# Patient Record
Sex: Female | Born: 1935 | Race: White | Hispanic: No | Smoking: Never smoker
Health system: Southern US, Community
[De-identification: ages and names within clinical notes are randomized; demographics above are authoritative.]

## PROBLEM LIST (undated history)

## (undated) DIAGNOSIS — R0602 Shortness of breath: Secondary | ICD-10-CM

## (undated) DIAGNOSIS — IMO0002 Reserved for concepts with insufficient information to code with codable children: Secondary | ICD-10-CM

## (undated) DIAGNOSIS — I779 Disorder of arteries and arterioles, unspecified: Secondary | ICD-10-CM

## (undated) DIAGNOSIS — Z90710 Acquired absence of both cervix and uterus: Secondary | ICD-10-CM

## (undated) DIAGNOSIS — I2510 Atherosclerotic heart disease of native coronary artery without angina pectoris: Secondary | ICD-10-CM

## (undated) DIAGNOSIS — K859 Acute pancreatitis without necrosis or infection, unspecified: Secondary | ICD-10-CM

## (undated) DIAGNOSIS — M489 Spondylopathy, unspecified: Secondary | ICD-10-CM

## (undated) DIAGNOSIS — E785 Hyperlipidemia, unspecified: Secondary | ICD-10-CM

## (undated) DIAGNOSIS — R002 Palpitations: Secondary | ICD-10-CM

## (undated) DIAGNOSIS — I10 Essential (primary) hypertension: Secondary | ICD-10-CM

## (undated) DIAGNOSIS — Z951 Presence of aortocoronary bypass graft: Secondary | ICD-10-CM

## (undated) DIAGNOSIS — I739 Peripheral vascular disease, unspecified: Secondary | ICD-10-CM

## (undated) DIAGNOSIS — R9430 Abnormal result of cardiovascular function study, unspecified: Secondary | ICD-10-CM

## (undated) DIAGNOSIS — E875 Hyperkalemia: Secondary | ICD-10-CM

## (undated) DIAGNOSIS — R42 Dizziness and giddiness: Secondary | ICD-10-CM

## (undated) HISTORY — DX: Disorder of arteries and arterioles, unspecified: I77.9

## (undated) HISTORY — DX: Hyperkalemia: E87.5

## (undated) HISTORY — PX: CHOLECYSTECTOMY: SHX55

## (undated) HISTORY — DX: Hyperlipidemia, unspecified: E78.5

## (undated) HISTORY — DX: Essential (primary) hypertension: I10

## (undated) HISTORY — DX: Dizziness and giddiness: R42

## (undated) HISTORY — DX: Palpitations: R00.2

## (undated) HISTORY — DX: Acute pancreatitis without necrosis or infection, unspecified: K85.90

## (undated) HISTORY — DX: Abnormal result of cardiovascular function study, unspecified: R94.30

## (undated) HISTORY — DX: Shortness of breath: R06.02

## (undated) HISTORY — DX: Acquired absence of both cervix and uterus: Z90.710

## (undated) HISTORY — DX: Reserved for concepts with insufficient information to code with codable children: IMO0002

## (undated) HISTORY — DX: Atherosclerotic heart disease of native coronary artery without angina pectoris: I25.10

## (undated) HISTORY — DX: Peripheral vascular disease, unspecified: I73.9

## (undated) HISTORY — DX: Presence of aortocoronary bypass graft: Z95.1

## (undated) HISTORY — DX: Spondylopathy, unspecified: M48.9

---

## 1998-03-13 HISTORY — PX: CORONARY ARTERY BYPASS GRAFT: SHX141

## 1998-06-13 ENCOUNTER — Inpatient Hospital Stay (HOSPITAL_COMMUNITY): Admission: EM | Admit: 1998-06-13 | Discharge: 1998-06-24 | Payer: Self-pay | Admitting: Emergency Medicine

## 1998-06-13 ENCOUNTER — Encounter: Payer: Self-pay | Admitting: Emergency Medicine

## 1998-06-14 ENCOUNTER — Encounter: Payer: Self-pay | Admitting: Critical Care Medicine

## 1998-06-15 ENCOUNTER — Encounter: Payer: Self-pay | Admitting: Critical Care Medicine

## 1998-06-17 ENCOUNTER — Encounter: Payer: Self-pay | Admitting: Critical Care Medicine

## 1998-06-18 ENCOUNTER — Encounter: Payer: Self-pay | Admitting: Thoracic Surgery (Cardiothoracic Vascular Surgery)

## 1998-06-19 ENCOUNTER — Encounter: Payer: Self-pay | Admitting: Thoracic Surgery (Cardiothoracic Vascular Surgery)

## 1998-06-20 ENCOUNTER — Encounter: Payer: Self-pay | Admitting: Thoracic Surgery (Cardiothoracic Vascular Surgery)

## 1998-06-21 ENCOUNTER — Encounter: Payer: Self-pay | Admitting: Cardiothoracic Surgery

## 1998-06-23 ENCOUNTER — Encounter: Payer: Self-pay | Admitting: Thoracic Surgery (Cardiothoracic Vascular Surgery)

## 2000-11-13 ENCOUNTER — Encounter: Payer: Self-pay | Admitting: Internal Medicine

## 2000-11-13 ENCOUNTER — Ambulatory Visit (HOSPITAL_COMMUNITY): Admission: RE | Admit: 2000-11-13 | Discharge: 2000-11-13 | Payer: Self-pay | Admitting: Internal Medicine

## 2003-07-08 ENCOUNTER — Emergency Department (HOSPITAL_COMMUNITY): Admission: EM | Admit: 2003-07-08 | Discharge: 2003-07-09 | Payer: Self-pay | Admitting: Emergency Medicine

## 2003-07-09 ENCOUNTER — Inpatient Hospital Stay (HOSPITAL_COMMUNITY): Admission: EM | Admit: 2003-07-09 | Discharge: 2003-07-11 | Payer: Self-pay | Admitting: Cardiology

## 2004-07-14 ENCOUNTER — Ambulatory Visit: Payer: Self-pay | Admitting: Cardiology

## 2004-09-29 ENCOUNTER — Inpatient Hospital Stay (HOSPITAL_COMMUNITY): Admission: EM | Admit: 2004-09-29 | Discharge: 2004-10-02 | Payer: Self-pay | Admitting: Emergency Medicine

## 2004-09-30 ENCOUNTER — Ambulatory Visit: Payer: Self-pay | Admitting: Cardiology

## 2004-10-01 ENCOUNTER — Ambulatory Visit: Payer: Self-pay | Admitting: Internal Medicine

## 2004-11-07 ENCOUNTER — Ambulatory Visit: Payer: Self-pay | Admitting: Cardiology

## 2004-11-10 ENCOUNTER — Ambulatory Visit: Payer: Self-pay | Admitting: Internal Medicine

## 2004-11-16 ENCOUNTER — Ambulatory Visit: Payer: Self-pay | Admitting: Cardiology

## 2004-11-29 ENCOUNTER — Ambulatory Visit: Payer: Self-pay | Admitting: Internal Medicine

## 2005-01-11 ENCOUNTER — Ambulatory Visit: Payer: Self-pay | Admitting: Internal Medicine

## 2005-01-19 ENCOUNTER — Ambulatory Visit: Payer: Self-pay | Admitting: Internal Medicine

## 2005-02-28 ENCOUNTER — Ambulatory Visit: Payer: Self-pay | Admitting: Cardiology

## 2005-05-10 ENCOUNTER — Ambulatory Visit: Payer: Self-pay | Admitting: Internal Medicine

## 2005-06-29 ENCOUNTER — Ambulatory Visit: Payer: Self-pay | Admitting: Internal Medicine

## 2005-08-24 ENCOUNTER — Ambulatory Visit: Payer: Self-pay | Admitting: Cardiology

## 2005-10-03 ENCOUNTER — Emergency Department (HOSPITAL_COMMUNITY): Admission: EM | Admit: 2005-10-03 | Discharge: 2005-10-03 | Payer: Self-pay | Admitting: Emergency Medicine

## 2005-11-07 ENCOUNTER — Ambulatory Visit: Payer: Self-pay | Admitting: Cardiology

## 2006-03-12 ENCOUNTER — Ambulatory Visit: Payer: Self-pay | Admitting: Cardiology

## 2006-03-12 LAB — CONVERTED CEMR LAB
ALT: 15 units/L (ref 0–40)
Albumin: 3.7 g/dL (ref 3.5–5.2)
Alkaline Phosphatase: 63 units/L (ref 39–117)
Bilirubin, Direct: 0.1 mg/dL (ref 0.0–0.3)
HDL: 53.1 mg/dL (ref >39.0)

## 2006-04-18 ENCOUNTER — Ambulatory Visit: Payer: Self-pay | Admitting: Cardiology

## 2006-04-18 ENCOUNTER — Inpatient Hospital Stay (HOSPITAL_COMMUNITY): Admission: EM | Admit: 2006-04-18 | Discharge: 2006-04-20 | Payer: Self-pay | Admitting: Emergency Medicine

## 2006-05-01 ENCOUNTER — Ambulatory Visit: Payer: Self-pay

## 2006-05-01 ENCOUNTER — Ambulatory Visit: Payer: Self-pay | Admitting: Cardiology

## 2006-05-01 LAB — CONVERTED CEMR LAB
Basophils Relative: 0.3 % (ref 0.0–1.0)
Creatinine, Ser: 1 mg/dL (ref 0.4–1.2)
Eosinophils Relative: 0.8 % (ref 0.0–5.0)
GFR calc Af Amer: 70 mL/min
Glucose, Bld: 281 mg/dL — ABNORMAL HIGH (ref 70–99)
Hemoglobin: 11 g/dL — ABNORMAL LOW (ref 12.0–15.0)
MCV: 92.1 fL (ref 78.0–100.0)
Monocytes Relative: 5.8 % (ref 3.0–11.0)
Platelets: 321 10*3/uL (ref 150–400)
Potassium: 5.2 meq/L — ABNORMAL HIGH (ref 3.5–5.1)
RBC: 3.57 M/uL — ABNORMAL LOW (ref 3.87–5.11)
RDW: 12.5 % (ref 11.5–14.6)
Sodium: 142 meq/L (ref 135–145)
TSH: 2.07 microintl units/mL (ref 0.35–5.50)

## 2006-07-06 ENCOUNTER — Ambulatory Visit: Payer: Self-pay | Admitting: Cardiology

## 2006-08-23 ENCOUNTER — Ambulatory Visit: Payer: Self-pay | Admitting: Cardiology

## 2006-08-23 LAB — CONVERTED CEMR LAB
ALT: 15 units/L (ref 0–40)
AST: 21 units/L (ref 0–37)
Cholesterol: 155 mg/dL (ref 0–200)
HDL: 44.2 mg/dL (ref >39.0)
Total Bilirubin: 1 mg/dL (ref 0.3–1.2)
VLDL: 34 mg/dL (ref 0–40)

## 2006-12-28 ENCOUNTER — Ambulatory Visit: Payer: Self-pay | Admitting: Cardiology

## 2007-03-14 HISTORY — PX: CARDIAC CATHETERIZATION: SHX172

## 2007-05-20 ENCOUNTER — Inpatient Hospital Stay (HOSPITAL_COMMUNITY): Admission: EM | Admit: 2007-05-20 | Discharge: 2007-05-22 | Payer: Self-pay | Admitting: Emergency Medicine

## 2007-05-20 ENCOUNTER — Ambulatory Visit: Payer: Self-pay | Admitting: Cardiology

## 2007-06-11 ENCOUNTER — Ambulatory Visit: Payer: Self-pay | Admitting: Cardiology

## 2007-12-04 ENCOUNTER — Ambulatory Visit: Payer: Self-pay | Admitting: Cardiology

## 2008-01-02 ENCOUNTER — Ambulatory Visit: Payer: Self-pay | Admitting: Cardiology

## 2008-07-26 ENCOUNTER — Ambulatory Visit: Payer: Self-pay | Admitting: Cardiology

## 2008-07-26 ENCOUNTER — Observation Stay (HOSPITAL_COMMUNITY): Admission: EM | Admit: 2008-07-26 | Discharge: 2008-07-28 | Payer: Self-pay | Admitting: *Deleted

## 2008-08-17 ENCOUNTER — Encounter: Payer: Self-pay | Admitting: Internal Medicine

## 2008-08-17 ENCOUNTER — Telehealth: Payer: Self-pay | Admitting: Cardiology

## 2008-09-15 ENCOUNTER — Encounter: Payer: Self-pay | Admitting: Cardiology

## 2008-09-17 ENCOUNTER — Ambulatory Visit: Payer: Self-pay | Admitting: Cardiology

## 2009-03-15 ENCOUNTER — Ambulatory Visit: Payer: Self-pay | Admitting: Cardiology

## 2009-06-09 ENCOUNTER — Telehealth: Payer: Self-pay | Admitting: Cardiology

## 2009-09-05 ENCOUNTER — Inpatient Hospital Stay (HOSPITAL_COMMUNITY): Admission: EM | Admit: 2009-09-05 | Discharge: 2009-09-10 | Payer: Self-pay | Admitting: Emergency Medicine

## 2009-09-05 ENCOUNTER — Encounter: Payer: Self-pay | Admitting: Cardiology

## 2009-09-17 ENCOUNTER — Emergency Department (HOSPITAL_COMMUNITY): Admission: EM | Admit: 2009-09-17 | Discharge: 2009-09-17 | Payer: Self-pay | Admitting: Emergency Medicine

## 2009-11-02 ENCOUNTER — Ambulatory Visit: Payer: Self-pay | Admitting: Cardiology

## 2009-12-07 ENCOUNTER — Encounter: Payer: Self-pay | Admitting: Cardiology

## 2009-12-10 ENCOUNTER — Encounter: Payer: Self-pay | Admitting: Cardiology

## 2009-12-16 ENCOUNTER — Ambulatory Visit: Payer: Self-pay | Admitting: Cardiology

## 2009-12-27 ENCOUNTER — Encounter: Payer: Self-pay | Admitting: Cardiology

## 2009-12-27 ENCOUNTER — Ambulatory Visit: Payer: Self-pay | Admitting: Cardiology

## 2009-12-27 ENCOUNTER — Ambulatory Visit: Payer: Self-pay

## 2009-12-27 ENCOUNTER — Ambulatory Visit (HOSPITAL_COMMUNITY): Admission: RE | Admit: 2009-12-27 | Discharge: 2009-12-27 | Payer: Self-pay | Admitting: Cardiology

## 2010-01-01 ENCOUNTER — Encounter: Payer: Self-pay | Admitting: Cardiology

## 2010-01-06 ENCOUNTER — Ambulatory Visit: Payer: Self-pay | Admitting: Cardiology

## 2010-04-14 NOTE — Letter (Signed)
Summary: [facility]   Imported By: Roderic Ovens 09/23/2009 11:26:40  _____________________________________________________________________  External Attachment:    Type:   Image     Comment:   External Document

## 2010-04-14 NOTE — Miscellaneous (Signed)
  Clinical Lists Changes  Observations: Added new observation of PAST MED HX: CABG.. 2000,  LIMA to the LAD, SVG to the diagonal SVG to OM SVG to posterior descending CAD...last cath 2009... grafts patent... ejection fraction 70%......80% small OM possible mild ischemia a EF  70% cath...2009  /    65-70%.....echo....12/2009 HYPERTENSION (ICD-401.9) PALPITATIONS, HX OF (ICD-V12.50) DYSLIPIDEMIA (ICD-272.4) DIABETES MELLITUS, TYPE II (ICD-250.00) PANCREATITIS, HX OF (ICD-V12.70)  normal LV function Carotid artery disease.. mild in 2000 Dizziness... workup April 2008.. no treatment needed cervical spine disease.... severe neck pain..... June, 2011 Shortness of breath (01/01/2010 21:12) Added new observation of PRIMARY MD: Juluis Rainier, MD (01/01/2010 21:12)       Past History:  Past Medical History: CABG.. 2000,  LIMA to the LAD, SVG to the diagonal SVG to OM SVG to posterior descending CAD...last cath 2009... grafts patent... ejection fraction 70%......80% small OM possible mild ischemia a EF  70% cath...2009  /    65-70%.....echo....12/2009 HYPERTENSION (ICD-401.9) PALPITATIONS, HX OF (ICD-V12.50) DYSLIPIDEMIA (ICD-272.4) DIABETES MELLITUS, TYPE II (ICD-250.00) PANCREATITIS, HX OF (ICD-V12.70)  normal LV function Carotid artery disease.. mild in 2000 Dizziness... workup April 2008.. no treatment needed cervical spine disease.... severe neck pain..... June, 2011 Shortness of breath

## 2010-04-14 NOTE — Progress Notes (Signed)
Summary: REFILL  Phone Note Refill Request Message from:  Patient on June 09, 2009 9:12 AM  Refills Requested: Medication #1:  LIPITOR 20 MG TABS Take one tablet by mouth once daily. [facility] [address] [phone] PT IS OUT OF MEDICATION  Initial call taken by: Judie Grieve,  June 09, 2009 9:12 AM  Follow-up for Phone Call        Rx completed in Dr. Tiajuana Amass Follow-up by: Hardin Negus, RMA,  June 09, 2009 1:29 PM

## 2010-04-14 NOTE — Letter (Signed)
Summary: [facility] Visit Note   [facility] Office Visit Note   Imported By: Roderic Ovens 12/24/2009 12:47:03  _____________________________________________________________________  External Attachment:    Type:   Image     Comment:   External Document

## 2010-04-14 NOTE — Assessment & Plan Note (Signed)
Summary: f19m   Visit Type:  6 mos Primary Provider:  C. Miguel Aschoff, MD  CC:  CAD.  History of Present Illness: The patient is seen for followup of coronary artery disease.  She is doing well.  She had an upper respiratory infection recently that was well treated.  She's not had any chest pain.  Her blood pressure today is nicely controlled.  He had been elevated at her primary doctor office when she had her upper respiratory infection.  Current Medications (verified): 1)  Aspirin 81 Mg Tbec (Aspirin) .... Take One Tablet By Mouth Daily 2)  Prinivil 10 Mg Tabs (Lisinopril) .... Take 1 Tablet By Mouth Two Times A Day 3)  Plavix 75 Mg Tabs (Clopidogrel Bisulfate) .... Take One Tablet By Mouth Daily 4)  Imdur 30 Mg Xr24h-Tab (Isosorbide Mononitrate) .... Take 1 Tablet By Mouth Once A Day 5)  Multivitamins  Tabs (Multiple Vitamin) .... Take 1 Tablet By Mouth Once A Day 6)  Januvia 100 Mg Tabs (Sitagliptin Phosphate) .... Take 1 Tablet By Mouth Once A Day 7)  Glucophage 500 Mg Tabs (Metformin Hcl) .... Take 1 Tablet By Mouth Two Times A Day 8)  Nitroglycerin 0.4 Mg Subl (Nitroglycerin) .... One Tablet Under Tongue Every 5 Minutes As Needed For Chest Pain---May Repeat Times Three 9)  Furosemide 20 Mg Tabs (Furosemide) .... Take One Tablet By Mouth Once Daily. 10)  Lipitor 20 Mg Tabs (Atorvastatin Calcium) .... Take One Tablet By Mouth Once Daily. 11)  Glyburide 5 Mg Tabs (Glyburide) .... On Tablet Three Times A Day  Allergies: 1)  ! Cortisone 2)  ! Codeine 3)  ! * Metronidazole? 4)  Codeine Phosphate (Codeine Phosphate) 5)  Codeine Phosphate (Codeine Phosphate) 6)  Cipro  Past History:  Past Medical History: Last updated: 09/15/2008 CABG.. 2000,  LIMA to the LAD, SVG to the diagonal SVG to OM SVG to posterior descending CAD...last cath 2009... grafts patent... ejection fraction 70%..80% small OM possible mild ischemia a HYPERTENSION (ICD-401.9) PALPITATIONS, HX OF  (ICD-V12.50) DYSLIPIDEMIA (ICD-272.4) DIABETES MELLITUS, TYPE II (ICD-250.00) PANCREATITIS, HX OF (ICD-V12.70)  normal LV function Carotid artery disease.. mild in 2000 Dizziness... workup April 2008.. no treatment needed  Review of Systems       Patient denies fever, chills, headache, sweats, rash, change in vision, change in hearing, chest pain, shortness of breath.  Her upper respiratory infection was associated with a cough that is improving.  She's not having any GI symptoms or urinary symptoms.  All other systems are reviewed and are negative.  Vital Signs:  Patient profile:   77 year old female Height:      67 inches Weight:      151 pounds BMI:     23.74 Pulse rate:   69 / minute BP sitting:   119 / 65  (left arm) Cuff size:   large  Vitals Entered By: Sara Staggers, RN (March 15, 2009 9:58 AM)  Physical Exam  General:  patient is stable in general. Eyes:  no xanthelasma. Neck:  no jugular venous distention. Lungs:  lungs are clear.  Respiratory effort is not labored Heart:  cardiac exam reveals S1-S2.  No clicks or significant murmurs. Abdomen:  abdomen is soft. Msk:  no musculoskeletal deformities. Extremities:  no peripheral edema. Psych:  patient is oriented to person time and place affect is normal.   Impression & Recommendations:  Problem # 1:  CAROTID ARTERY DISEASE (ICD-433.10) Patient had very minimal disease in the past.  At the time of her next visit we'll reconsider whether she should have a followup Doppler.  I do not hear bruits now.  Problem # 2:  CAD (ICD-414.00)  Coronary disease is stable.  EKG is done and reviewed by me.  They're nonspecific ST-T wave changes.  She is stable and no further workup is needed.  Orders: EKG w/ Interpretation (93000)  Problem # 3:  HYPERTENSION (ICD-401.9) Blood pressure is under good control today.  No change in therapy.  Patient Instructions: 1)  Follow up in 6 months

## 2010-04-14 NOTE — Miscellaneous (Signed)
  Clinical Lists Changes  Observations: Added new observation of CARDIO HPI: Dr Juluis Rainier called to tell me that the patient said she was having SOB with exertion. We Will arrange early follow up. (12/10/2009 13:59) Added new observation of PRIMARY MD: Juluis Rainier, MD (12/10/2009 13:59)      Primary Provider:  Juluis Rainier, MD   History of Present Illness: Dr Juluis Rainier called to tell me that the patient said she was having SOB with exertion. We Will arrange early follow up.    Primary Provider:  Juluis Rainier, MD   History of Present Illness: Dr Juluis Rainier called to tell me that the patient said she was having SOB with exertion. We Will arrange early follow up.  Appended Document:  Herbert Seta, see this note. Please call the patient to ask her about this. Then arrange follow up with me as soon as you feel appropriate.   Thnanks  I actually received the phone call a few days ago.  Appended Document:  spoke w/pt she states she has been having SOB w/exersion since she was d/c'd from hospital in July, it hasn't really gotten much worse but isn't getting better, no CP sch appt w/Dr Myrtis Ser for thur 10/6

## 2010-04-14 NOTE — Assessment & Plan Note (Signed)
Summary: per checkout/sf   Visit Type:  Follow-up Primary Provider:  Juluis Rainier, MD  CC:  shortness of breath.  History of Present Illness: The patient is seen for cardiology followup.  I saw her last December 16, 2009.  She has shortness of breath.  Two-dimensional echo was done showing excellent function.  There is no major valvular abnormalities.  She is able to get around.  If she has shortness of breath she stops and then proceeds.  She's not having chest pain.  She had catheterization in 2009 area that study showed that her grafts were patent.  There was question that she could have slight ischemia from a small OM vessel.  Current Medications (verified): 1)  Aspirin 81 Mg Tbec (Aspirin) .... Take One Tablet By Mouth Daily 2)  Lisinopril 20 Mg Tabs (Lisinopril) .... Take One Tablet By Mouth Daily 3)  Plavix 75 Mg Tabs (Clopidogrel Bisulfate) .... Take One Tablet By Mouth Daily 4)  Multivitamins  Tabs (Multiple Vitamin) .... Take 1 Tablet By Mouth Once A Day 5)  Januvia 100 Mg Tabs (Sitagliptin Phosphate) .... Take 1 Tablet By Mouth Once A Day 6)  Glucophage 500 Mg Tabs (Metformin Hcl) .... Take 1 Tablet By Mouth Two Times A Day 7)  Nitrostat 0.4 Mg Subl (Nitroglycerin) .Marland Kitchen.. 1 Tablet Under Tongue At Onset of Chest Pain; You May Repeat Every 5 Minutes For Up To 3 Doses. 8)  Furosemide 20 Mg Tabs (Furosemide) .... Take 1/2 Tablet By Mouth Once Daily. 9)  Lipitor 20 Mg Tabs (Atorvastatin Calcium) .... Take One Tablet By Mouth Once Daily. 10)  Glyburide 5 Mg Tabs (Glyburide) .... On Tablet Two Times A Day 11)  Acetaminophen 325 Mg  Tabs (Acetaminophen) .... As Needed 12)  Neurontin 100 Mg Caps (Gabapentin) .... Two Times A Day 13)  Nortriptyline Hcl 25 Mg Caps (Nortriptyline Hcl) .... At Bedtime  Allergies (verified): 1)  ! Codeine  Past History:  Past Medical History: CABG.. 2000,  LIMA to the LAD, SVG to the diagonal SVG to OM SVG to posterior descending CAD...last cath 2009...  grafts patent... ejection fraction 70%......80% small OM possible mild ischemia a EF  70% cath...2009  /    65-70%.....echo....12/2009 HYPERTENSION (ICD-401.9) PALPITATIONS, HX OF (ICD-V12.50) DYSLIPIDEMIA (ICD-272.4) DIABETES MELLITUS, TYPE II (ICD-250.00).Marland Kitchen PANCREATITIS, HX OF (ICD-V12.70)  normal LV function Carotid artery disease.. mild in 2000 Dizziness... workup April 2008.. no treatment needed cervical spine disease.... severe neck pain..... June, 2011 Shortness of breath  Review of Systems       Patient denies fever, chills, headache, sweats, rash, change in vision, change in hearing, chest pain, cough, nausea vomiting, urinary symptoms.  All other systems are reviewed and are negative.  Vital Signs:  Patient profile:   77 year old female Height:      67 inches Weight:      153 pounds BMI:     24.05 Pulse rate:   85 / minute BP sitting:   134 / 72  (left arm) Cuff size:   regular  Vitals Entered By: Hardin Negus, RMA (January 06, 2010 11:28 AM)  Physical Exam  General:  patient stable today. Eyes:  no xanthelasma. Neck:  no jugular venous distention. Chest Wall:  no chest wall tenderness. Lungs:  lungs are clear.  Respiratory effort is nonlabored. Heart:  cardiac exam reveals S1 and S2.  No clicks or significant murmurs. Abdomen:  abdomen is soft. Extremities:  no peripheral edema. Psych:  patient is oriented to person time  and place.  Affect is normal.   Impression & Recommendations:  Problem # 1:  MURMUR (ICD-785.2)  Her updated medication list for this problem includes:    Lisinopril 20 Mg Tabs (Lisinopril) .Marland Kitchen... Take one tablet by mouth daily    Nitrostat 0.4 Mg Subl (Nitroglycerin) .Marland Kitchen... 1 tablet under tongue at onset of chest pain; you may repeat every 5 minutes for up to 3 doses.    Furosemide 20 Mg Tabs (Furosemide) .Marland Kitchen... Take 1/2 tablet by mouth once daily. The patient does not have any significant valvular disease.  No further workup is  needed.  Problem # 2:  SHORTNESS OF BREATH (ICD-786.05)  Her updated medication list for this problem includes:    Aspirin 81 Mg Tbec (Aspirin) .Marland Kitchen... Take one tablet by mouth daily    Lisinopril 20 Mg Tabs (Lisinopril) .Marland Kitchen... Take one tablet by mouth daily    Furosemide 20 Mg Tabs (Furosemide) .Marland Kitchen... Take 1/2 tablet by mouth once daily. This point I am not convinced that the patient is having significant cardiac disease as the basis of her shortness of breath.  I do not think that this is an anginal equivalent.  I have chosen not to proceed with exercise testing.  Left ventricular function is good.  No change in medications. I will plan to see the patient back in 6 months for followup.  I have encouraged her to go back to full activities.  If she feels short of breath she can rest and then proceed  Problem # 3:  HYPERTENSION (ICD-401.9)  Her updated medication list for this problem includes:    Aspirin 81 Mg Tbec (Aspirin) .Marland Kitchen... Take one tablet by mouth daily    Lisinopril 20 Mg Tabs (Lisinopril) .Marland Kitchen... Take one tablet by mouth daily    Furosemide 20 Mg Tabs (Furosemide) .Marland Kitchen... Take 1/2 tablet by mouth once daily. Blood pressure is under good control.  No change in therapy.  Patient Instructions: 1)  Your physician recommends that you schedule a follow-up appointment in: 6 months. The office will mail you a reminder 2 months prior appopintment date. 2)  Your physician recommends that you continue on your current medications as directed. Please refer to the Current Medication list given to you today.

## 2010-04-14 NOTE — Assessment & Plan Note (Signed)
Summary: 6 month follow up/mt   Visit Type:  Follow-up Primary Provider:  Juluis Rainier, MD  CC:  CAD.  History of Present Illness: The patient has coronary disease. recently she was hospitalized with hypertension and neck pain.  She has significant disease at C5-C6.  It was felt that she should see neurosurgery in followup.  This was done and she is not interested in any type of surgery.  Unfortunately she continues to have significant neck pain.  She's not having any chest pain.  Current Medications (verified): 1)  Aspirin 81 Mg Tbec (Aspirin) .... Take One Tablet By Mouth Daily 2)  Lisinopril 20 Mg Tabs (Lisinopril) .... Take One Tablet By Mouth Daily 3)  Plavix 75 Mg Tabs (Clopidogrel Bisulfate) .... Take One Tablet By Mouth Daily 4)  Multivitamins  Tabs (Multiple Vitamin) .... Take 1 Tablet By Mouth Once A Day 5)  Januvia 100 Mg Tabs (Sitagliptin Phosphate) .... Take 1 Tablet By Mouth Once A Day 6)  Glucophage 500 Mg Tabs (Metformin Hcl) .... Take 1 Tablet By Mouth Two Times A Day 7)  Nitroglycerin 0.4 Mg Subl (Nitroglycerin) .... One Tablet Under Tongue Every 5 Minutes As Needed For Chest Pain---May Repeat Times Three 8)  Furosemide 20 Mg Tabs (Furosemide) .... Take 1/2 Tablet By Mouth Once Daily. 9)  Lipitor 20 Mg Tabs (Atorvastatin Calcium) .... Take One Tablet By Mouth Once Daily. 10)  Glyburide 5 Mg Tabs (Glyburide) .... On Tablet Two Times A Day 11)  Acetaminophen 325 Mg  Tabs (Acetaminophen) .... As Needed 12)  Lorazepam 0.5 Mg Tabs (Lorazepam) .... As Needed 13)  Neurontin 100 Mg Caps (Gabapentin) .... Two Times A Day 14)  Nortriptyline Hcl 25 Mg Caps (Nortriptyline Hcl) .... At Bedtime  Allergies: 1)  ! Codeine  Past History:  Past Medical History: CABG.. 2000,  LIMA to the LAD, SVG to the diagonal SVG to OM SVG to posterior descending CAD...last cath 2009... grafts patent... ejection fraction 70%..80% small OM possible mild ischemia a HYPERTENSION  (ICD-401.9) PALPITATIONS, HX OF (ICD-V12.50) DYSLIPIDEMIA (ICD-272.4) DIABETES MELLITUS, TYPE II (ICD-250.00) PANCREATITIS, HX OF (ICD-V12.70)  normal LV function Carotid artery disease.. mild in 2000 Dizziness... workup April 2008.. no treatment needed cervical spine disease.... severe neck pain..... June, 2011  Review of Systems       Patient denies fever, chills, headache, sweats, rash, change in vision, change in hearing, chest pain, cough, nausea vomiting, urinary symptoms.  All of the systems are reviewed and are negative.  Vital Signs:  Patient profile:   77 year old female Height:      67 inches Weight:      154 pounds BMI:     24.21 Pulse rate:   70 / minute BP sitting:   128 / 66  (left arm) Cuff size:   regular  Vitals Entered By: Hardin Negus, RMA (November 02, 2009 3:20 PM)  Physical Exam  General:  patient is stable today.  She seems uncomfortable with neck pain. Head:  head is atraumatic. Eyes:  no xanthelasma. Neck:  no jugular venous distention. Lungs:  lungs are clear.  Respiratory effort is nonlabored. Heart:  cardiac exam reveals S1 and S2.  No clicks or significant murmurs. Abdomen:  abdomen is soft. Extremities:  no peripheral edema. Psych:  patient is oriented to person time and place.  Affect is normal.   Impression & Recommendations:  Problem # 1:  * CERVICAL SPINE DISEASE.... NECK PAIN Unfortunately the patient continues to have significant discomfort from  this problem.  I made it clear to her that from the cardiac viewpoint I feel that she is stable for possible surgery if she were to consider this.  Problem # 2:  CAROTID ARTERY DISEASE (ICD-433.10)  Her updated medication list for this problem includes:    Aspirin 81 Mg Tbec (Aspirin) .Marland Kitchen... Take one tablet by mouth daily    Plavix 75 Mg Tabs (Clopidogrel bisulfate) .Marland Kitchen... Take one tablet by mouth daily Historically her carotid artery disease is mild.  Problem # 3:  CAD (ICD-414.00)  The  following medications were removed from the medication list:    Imdur 30 Mg Xr24h-tab (Isosorbide mononitrate) .Marland Kitchen... Take 1 tablet by mouth once a day Her updated medication list for this problem includes:    Aspirin 81 Mg Tbec (Aspirin) .Marland Kitchen... Take one tablet by mouth daily    Lisinopril 20 Mg Tabs (Lisinopril) .Marland Kitchen... Take one tablet by mouth daily    Plavix 75 Mg Tabs (Clopidogrel bisulfate) .Marland Kitchen... Take one tablet by mouth daily    Nitroglycerin 0.4 Mg Subl (Nitroglycerin) ..... One tablet under tongue every 5 minutes as needed for chest pain---may repeat times three Coronary disease is stable.  No further workup needed.  Problem # 4:  HYPERTENSION (ICD-401.9)  Her updated medication list for this problem includes:    Aspirin 81 Mg Tbec (Aspirin) .Marland Kitchen... Take one tablet by mouth daily    Lisinopril 20 Mg Tabs (Lisinopril) .Marland Kitchen... Take one tablet by mouth daily    Furosemide 20 Mg Tabs (Furosemide) .Marland Kitchen... Take 1/2 tablet by mouth once daily. Blood pressure is controlled today.  No change in therapy.  Patient Instructions: 1)  Your physician recommends that you schedule a follow-up appointment in: 6 months

## 2010-04-14 NOTE — Assessment & Plan Note (Signed)
Summary: [name]   Visit Type:  Follow-up Primary Provider:  Juluis Rainier, MD  CC:  shortness of breath.  History of Present Illness: The patient is here for followup of shortness of breath.  I had received a phone call from Dr. Zachery Dauer.  She mentioned that the patient has complained of shortness of breath with exertion.  The patient has not had PND or orthopnea.  She goes about reasonable activities.  She does clean her own home.  However when she becomes short of breath he stops and rests.  She has not had chest.  There's been no syncope or presyncope.  She's not had any significant palpitations.  Unfortunately she continues to have significant discomfort from her neck.  As outlined previously, she did not want any type of cervical surgery or neck pain.  I do not know if other forms of therapy could help her.  There is question also of a murmur.   Current Medications (verified): 1)  Aspirin 81 Mg Tbec (Aspirin) .... Take One Tablet By Mouth Daily 2)  Lisinopril 20 Mg Tabs (Lisinopril) .... Take One Tablet By Mouth Daily 3)  Plavix 75 Mg Tabs (Clopidogrel Bisulfate) .... Take One Tablet By Mouth Daily 4)  Multivitamins  Tabs (Multiple Vitamin) .... Take 1 Tablet By Mouth Once A Day 5)  Januvia 100 Mg Tabs (Sitagliptin Phosphate) .... Take 1 Tablet By Mouth Once A Day 6)  Glucophage 500 Mg Tabs (Metformin Hcl) .... Take 1 Tablet By Mouth Two Times A Day 7)  Nitrostat 0.4 Mg Subl (Nitroglycerin) .Marland Kitchen.. 1 Tablet Under Tongue At Onset of Chest Pain; You May Repeat Every 5 Minutes For Up To 3 Doses. 8)  Furosemide 20 Mg Tabs (Furosemide) .... Take 1/2 Tablet By Mouth Once Daily. 9)  Lipitor 20 Mg Tabs (Atorvastatin Calcium) .... Take One Tablet By Mouth Once Daily. 10)  Glyburide 5 Mg Tabs (Glyburide) .... On Tablet Two Times A Day 11)  Acetaminophen 325 Mg  Tabs (Acetaminophen) .... As Needed 12)  Neurontin 100 Mg Caps (Gabapentin) .... Two Times A Day 13)  Nortriptyline Hcl 25 Mg Caps  (Nortriptyline Hcl) .... At Bedtime  Allergies (verified): 1)  ! Codeine  Past History:  Past Medical History: CABG.. 2000,  LIMA to the LAD, SVG to the diagonal SVG to OM SVG to posterior descending CAD...last cath 2009... grafts patent... ejection fraction 70%..80% small OM possible mild ischemia a HYPERTENSION (ICD-401.9) PALPITATIONS, HX OF (ICD-V12.50) DYSLIPIDEMIA (ICD-272.4) DIABETES MELLITUS, TYPE II (ICD-250.00) PANCREATITIS, HX OF (ICD-V12.70)  normal LV function Carotid artery disease.. mild in 2000 Dizziness... workup April 2008.. no treatment needed cervical spine disease.... severe neck pain..... June, 2011 Shortness of breath  Review of Systems       Patient denies fever, chills, headache, sweats, rash, change in vision, change in hearing, chest pain, cough, nausea vomiting, urinary symptoms.  All other systems are reviewed and are negative.  Vital Signs:  Patient profile:   77 year old female Height:      67 inches Weight:      152 pounds BMI:     23.89 Pulse rate:   70 / minute BP sitting:   134 / 78  Vitals Entered By: Hardin Negus, RMA (December 16, 2009 10:00 AM)  Physical Exam  General:  patient is stable today. Head:  head is atraumatic. Eyes:  no xanthelasma. Neck:  no jugular venous distention. Chest Wall:  no chest wall tenderness. Lungs:  lungs are clear.  Respiratory effort  is nonlabored. Heart:  cardiac exam reveals S1 and S2.  there is a 2/6 systolic murmur. Abdomen:  abdomen is soft. Msk:  no musculoskeletal deformities. Extremities:  no peripheral edema. Skin:  no skin rashes. Psych:  patient is oriented to person time and place affect is normal.   Impression & Recommendations:  Problem # 1:  SHORTNESS OF BREATH (ICD-786.05)  Her updated medication list for this problem includes:    Aspirin 81 Mg Tbec (Aspirin) .Marland Kitchen... Take one tablet by mouth daily    Lisinopril 20 Mg Tabs (Lisinopril) .Marland Kitchen... Take one tablet by mouth daily     Furosemide 20 Mg Tabs (Furosemide) .Marland Kitchen... Take 1/2 tablet by mouth once daily. the patient has shortness of breath.  Recent labs revealed BUN of 16 creatinine 1.06.  Hemoglobin A1c was 6.7.  LDL was 76 with an HDL of 48 and triglycerides 184.  I do not have a hemoglobin. There is a soft systolic murmur.  I doubt significant aortic stenosis.  However 2-D echo will be done to reassess all valves and left ventricular and right ventricular function for further information.  I will then see her back for followup.  I have chosen not to change any of her medicines at this time.  Problem # 2:  MURMUR (ICD-785.2)  Her updated medication list for this problem includes:    Lisinopril 20 Mg Tabs (Lisinopril) .Marland Kitchen... Take one tablet by mouth daily    Nitrostat 0.4 Mg Subl (Nitroglycerin) .Marland Kitchen... 1 tablet under tongue at onset of chest pain; you may repeat every 5 minutes for up to 3 doses.    Furosemide 20 Mg Tabs (Furosemide) .Marland Kitchen... Take 1/2 tablet by mouth once daily. There is a soft systolic murmur.  The echo scheduled will help [place] assess this.  Problem # 3:  CAROTID ARTERY DISEASE (ICD-433.10)  Her updated medication list for this problem includes:    Aspirin 81 Mg Tbec (Aspirin) .Marland Kitchen... Take one tablet by mouth daily    Plavix 75 Mg Tabs (Clopidogrel bisulfate) .Marland Kitchen... Take one tablet by mouth daily By  history the patient's carotid disease was mild. At some point we will consider a follow up Doppler  Problem # 4:  CAD (ICD-414.00)  Her updated medication list for this problem includes:    Aspirin 81 Mg Tbec (Aspirin) .Marland Kitchen... Take one tablet by mouth daily    Lisinopril 20 Mg Tabs (Lisinopril) .Marland Kitchen... Take one tablet by mouth daily    Plavix 75 Mg Tabs (Clopidogrel bisulfate) .Marland Kitchen... Take one tablet by mouth daily    Nitrostat 0.4 Mg Subl (Nitroglycerin) .Marland Kitchen... 1 tablet under tongue at onset of chest pain; you may repeat every 5 minutes for up to 3 doses.  Orders: Echocardiogram (Echo) The patient's coronary  disease is stable.  I'm not convinced that her shortness of breath is an anginal equivalent.  EKG was not done and it will be done when I see her back in followup.  Echo data will give [place] more information about her LV function and her valvular function.  Problem # 5:  HYPERTENSION (ICD-401.9)  Her updated medication list for this problem includes:    Aspirin 81 Mg Tbec (Aspirin) .Marland Kitchen... Take one tablet by mouth daily    Lisinopril 20 Mg Tabs (Lisinopril) .Marland Kitchen... Take one tablet by mouth daily    Furosemide 20 Mg Tabs (Furosemide) .Marland Kitchen... Take 1/2 tablet by mouth once daily. Blood pressure is controlled.  No change in therapy.  Problem # 6:  PALPITATIONS, HX  OF (ICD-V12.50) She is not having any significant palpitations.  Patient Instructions: 1)  Your physician recommends that you schedule a follow-up appointment in: 2-3 weeks 2)  Your physician has requested that you have an echocardiogram.  Echocardiography is a painless test that uses sound waves to create images of your heart. It provides your doctor with information about the size and shape of your heart and how well your heart's chambers and valves are working.  This procedure takes approximately one hour. There are no restrictions for this procedure. Prescriptions: NITROSTAT 0.4 MG SUBL (NITROGLYCERIN) 1 tablet under tongue at onset of chest pain; you may repeat every 5 minutes for up to 3 doses.  #25 x 11   Entered by:   Hardin Negus, RMA   Authorized by:   Talitha Givens, MD, [facility]   Signed by:   Hardin Negus, RMA on 12/16/2009   Method used:   Electronically to        [facility]  [address] [phone]* (retail)       [address]       [place], [place]  [address]       Ph: [phone] or [phone]       Fax: [phone]   RxID:   [id number]

## 2010-05-19 ENCOUNTER — Encounter: Payer: Self-pay | Admitting: Cardiology

## 2010-05-26 ENCOUNTER — Ambulatory Visit (INDEPENDENT_AMBULATORY_CARE_PROVIDER_SITE_OTHER): Payer: Medicare Other | Admitting: Cardiology

## 2010-05-26 ENCOUNTER — Encounter: Payer: Self-pay | Admitting: Cardiology

## 2010-05-26 DIAGNOSIS — R0602 Shortness of breath: Secondary | ICD-10-CM

## 2010-05-29 LAB — GLUCOSE, CAPILLARY
Glucose-Capillary: 129 mg/dL — ABNORMAL HIGH (ref 70–99)
Glucose-Capillary: 152 mg/dL — ABNORMAL HIGH (ref 70–99)
Glucose-Capillary: 152 mg/dL — ABNORMAL HIGH (ref 70–99)
Glucose-Capillary: 162 mg/dL — ABNORMAL HIGH (ref 70–99)
Glucose-Capillary: 111 mg/dL — ABNORMAL HIGH (ref 70–99)
Glucose-Capillary: 140 mg/dL — ABNORMAL HIGH (ref 70–99)
Glucose-Capillary: 145 mg/dL — ABNORMAL HIGH (ref 70–99)
Glucose-Capillary: 157 mg/dL — ABNORMAL HIGH (ref 70–99)
Glucose-Capillary: 179 mg/dL — ABNORMAL HIGH (ref 70–99)
Glucose-Capillary: 190 mg/dL — ABNORMAL HIGH (ref 70–99)
Glucose-Capillary: 194 mg/dL — ABNORMAL HIGH (ref 70–99)
Glucose-Capillary: 207 mg/dL — ABNORMAL HIGH (ref 70–99)

## 2010-05-29 LAB — POCT I-STAT, CHEM 8
BUN: 8 mg/dL (ref 6–23)
Calcium, Ion: 1.09 mmol/L — ABNORMAL LOW (ref 1.12–1.32)
Chloride: 100 mEq/L (ref 96–112)
Creatinine, Ser: 0.8 mg/dL (ref 0.4–1.2)
Glucose, Bld: 166 mg/dL — ABNORMAL HIGH (ref 70–99)
HCT: 38 % (ref 36.0–46.0)
Hemoglobin: 12.9 g/dL (ref 12.0–15.0)
Potassium: 4.3 mEq/L (ref 3.5–5.1)
Sodium: 130 mEq/L — ABNORMAL LOW (ref 135–145)
TCO2: 20 mmol/L (ref 0–100)

## 2010-05-29 LAB — BASIC METABOLIC PANEL
BUN: 11 mg/dL (ref 6–23)
BUN: 10 mg/dL (ref 6–23)
BUN: 12 mg/dL (ref 6–23)
CO2: 24 mEq/L (ref 19–32)
CO2: 24 mEq/L (ref 19–32)
CO2: 26 mEq/L (ref 19–32)
CO2: 27 mEq/L (ref 19–32)
Calcium: 9 mg/dL (ref 8.4–10.5)
Chloride: 100 mEq/L (ref 96–112)
Chloride: 101 mEq/L (ref 96–112)
Chloride: 94 mEq/L — ABNORMAL LOW (ref 96–112)
Creatinine, Ser: 0.73 mg/dL (ref 0.4–1.2)
Creatinine, Ser: 1.01 mg/dL (ref 0.4–1.2)
Creatinine, Ser: 1.03 mg/dL (ref 0.4–1.2)
GFR calc Af Amer: >60 mL/min (ref >60)
GFR calc Af Amer: >60 mL/min (ref >60)
GFR calc non Af Amer: >60 mL/min (ref >60)
GFR calc non Af Amer: 52 mL/min — ABNORMAL LOW (ref >60)
GFR calc non Af Amer: 56 mL/min — ABNORMAL LOW (ref >60)
Glucose, Bld: 145 mg/dL — ABNORMAL HIGH (ref 70–99)
Glucose, Bld: 172 mg/dL — ABNORMAL HIGH (ref 70–99)
Potassium: 3.6 mEq/L (ref 3.5–5.1)
Potassium: 3.7 mEq/L (ref 3.5–5.1)
Potassium: 4 mEq/L (ref 3.5–5.1)
Potassium: 4.8 mEq/L (ref 3.5–5.1)
Sodium: 136 mEq/L (ref 135–145)
Sodium: 133 mEq/L — ABNORMAL LOW (ref 135–145)
Sodium: 134 mEq/L — ABNORMAL LOW (ref 135–145)
Sodium: 136 mEq/L (ref 135–145)

## 2010-05-29 LAB — URINALYSIS, ROUTINE W REFLEX MICROSCOPIC
Bilirubin Urine: NEGATIVE
Glucose, UA: 250 mg/dL — AB
Hgb urine dipstick: NEGATIVE
Hgb urine dipstick: NEGATIVE
Ketones, ur: NEGATIVE mg/dL
Nitrite: NEGATIVE
Nitrite: NEGATIVE
Protein, ur: NEGATIVE mg/dL
Specific Gravity, Urine: 1.011 (ref 1.005–1.030)
Specific Gravity, Urine: 1.01 (ref 1.005–1.030)
Urobilinogen, UA: 0.2 mg/dL (ref 0.0–1.0)
Urobilinogen, UA: 0.2 mg/dL (ref 0.0–1.0)
pH: 5 (ref 5.0–8.0)
pH: 7 (ref 5.0–8.0)

## 2010-05-29 LAB — D-DIMER, QUANTITATIVE: D-Dimer, Quant: 0.6 ug/mL-FEU — ABNORMAL HIGH (ref 0.00–0.48)

## 2010-05-29 LAB — DIFFERENTIAL
Basophils Absolute: 0 10*3/uL (ref 0.0–0.1)
Basophils Absolute: 0 10*3/uL (ref 0.0–0.1)
Basophils Relative: 0 % (ref 0–1)
Basophils Relative: 0 % (ref 0–1)
Eosinophils Absolute: 0 10*3/uL (ref 0.0–0.7)
Eosinophils Absolute: 0 10*3/uL (ref 0.0–0.7)
Eosinophils Relative: 1 % (ref 0–5)
Lymphocytes Relative: 20 % (ref 12–46)
Lymphocytes Relative: 20 % (ref 12–46)
Lymphs Abs: 1.1 10*3/uL (ref 0.7–4.0)
Lymphs Abs: 1.6 10*3/uL (ref 0.7–4.0)
Monocytes Absolute: 0.5 10*3/uL (ref 0.1–1.0)
Monocytes Absolute: 0.6 10*3/uL (ref 0.1–1.0)
Monocytes Relative: 10 % (ref 3–12)
Neutro Abs: 3.9 10*3/uL (ref 1.7–7.7)
Neutro Abs: 4.1 10*3/uL (ref 1.7–7.7)
Neutro Abs: 5.8 10*3/uL (ref 1.7–7.7)
Neutrophils Relative %: 69 % (ref 43–77)
Neutrophils Relative %: 69 % (ref 43–77)

## 2010-05-29 LAB — CARDIAC PANEL(CRET KIN+CKTOT+MB+TROPI)
CK, MB: 1.8 ng/mL (ref 0.3–4.0)
CK, MB: 2 ng/mL (ref 0.3–4.0)
Relative Index: INVALID (ref 0.0–2.5)
Relative Index: INVALID (ref 0.0–2.5)
Total CK: 58 U/L (ref 7–177)
Troponin I: 0.02 ng/mL (ref 0.00–0.06)
Troponin I: 0.03 ng/mL (ref 0.00–0.06)

## 2010-05-29 LAB — PROTIME-INR: Prothrombin Time: 13.3 seconds (ref 11.6–15.2)

## 2010-05-29 LAB — CBC
HCT: 32.5 % — ABNORMAL LOW (ref 36.0–46.0)
HCT: 35.9 % — ABNORMAL LOW (ref 36.0–46.0)
HCT: 37.3 % (ref 36.0–46.0)
Hemoglobin: 11 g/dL — ABNORMAL LOW (ref 12.0–15.0)
Hemoglobin: 12.6 g/dL (ref 12.0–15.0)
MCH: 31.5 pg (ref 26.0–34.0)
MCH: 31.9 pg (ref 26.0–34.0)
MCHC: 35 g/dL (ref 30.0–36.0)
MCV: 91.2 fL (ref 78.0–100.0)
MCV: 92 fL (ref 78.0–100.0)
Platelets: 207 10*3/uL (ref 150–400)
Platelets: 227 10*3/uL (ref 150–400)
Platelets: 197 10*3/uL (ref 150–400)
Platelets: 212 10*3/uL (ref 150–400)
RBC: 3.37 MIL/uL — ABNORMAL LOW (ref 3.87–5.11)
RBC: 3.49 MIL/uL — ABNORMAL LOW (ref 3.87–5.11)
RBC: 3.94 MIL/uL (ref 3.87–5.11)
RBC: 4.03 MIL/uL (ref 3.87–5.11)
RDW: 13.9 % (ref 11.5–15.5)
RDW: 13.4 % (ref 11.5–15.5)
RDW: 13.5 % (ref 11.5–15.5)
WBC: 5.6 10*3/uL (ref 4.0–10.5)
WBC: 6.2 10*3/uL (ref 4.0–10.5)
WBC: 6 10*3/uL (ref 4.0–10.5)
WBC: 7.8 10*3/uL (ref 4.0–10.5)

## 2010-05-29 LAB — ALDOSTERONE + RENIN ACTIVITY W/ RATIO
ALDO / PRA Ratio: 0.3 Ratio — ABNORMAL LOW (ref 0.9–28.9)
Aldosterone: <1 ng/dL
PRA LC/MS/MS: 2.89 ng/mL/h (ref 0.25–5.82)

## 2010-05-29 LAB — COMPREHENSIVE METABOLIC PANEL
ALT: 14 U/L (ref 0–35)
AST: 21 U/L (ref 0–37)
Albumin: 3.4 g/dL — ABNORMAL LOW (ref 3.5–5.2)
Alkaline Phosphatase: 68 U/L (ref 39–117)
Chloride: 97 mEq/L (ref 96–112)
GFR calc Af Amer: 45 mL/min — ABNORMAL LOW (ref >60)
Potassium: 4.7 mEq/L (ref 3.5–5.1)
Total Bilirubin: 0.5 mg/dL (ref 0.3–1.2)

## 2010-05-29 LAB — ETHANOL CONFIRM, URINE: Ethanol, Ur - Confirmation: 0.025 GMS% (ref ?–0.04)

## 2010-05-29 LAB — URINE DRUGS OF ABUSE SCREEN W ALC, ROUTINE (REF LAB)
Amphetamine Screen, Ur: NEGATIVE
Barbiturate Quant, Ur: NEGATIVE
Benzodiazepines.: NEGATIVE
Cocaine Metabolites: NEGATIVE
Creatinine,U: 41.3 mg/dL
Ethyl Alcohol: 19 mg/dL — ABNORMAL HIGH (ref <10)
Marijuana Metabolite: NEGATIVE
Methadone: NEGATIVE
Opiate Screen, Urine: POSITIVE — AB
Phencyclidine (PCP): NEGATIVE
Propoxyphene: NEGATIVE

## 2010-05-29 LAB — POCT CARDIAC MARKERS
CKMB, poc: <1 ng/mL — ABNORMAL LOW (ref 1.0–8.0)
Myoglobin, poc: 157 ng/mL (ref 12–200)
Myoglobin, poc: 52.3 ng/mL (ref 12–200)
Troponin i, poc: <0.05 ng/mL (ref 0.00–0.09)

## 2010-05-29 LAB — VITAMIN D 1,25 DIHYDROXY
Vitamin D 1, 25 (OH)2 Total: 41 pg/mL (ref 18–72)
Vitamin D2 1, 25 (OH)2: <8 pg/mL
Vitamin D3 1, 25 (OH)2: 35 pg/mL

## 2010-05-29 LAB — C-REACTIVE PROTEIN: CRP: 0.2 mg/dL — ABNORMAL LOW (ref <0.6)

## 2010-05-29 LAB — METANEPHRINES, URINE, 24 HOUR: Normetanephrine, 24H Ur: 365 mcg/24 h (ref 122–676)

## 2010-05-29 LAB — LIPID PANEL
Cholesterol: 140 mg/dL (ref 0–200)
HDL: 60 mg/dL (ref >39)
LDL Cholesterol: 69 mg/dL (ref 0–99)
Total CHOL/HDL Ratio: 2.3 RATIO
Triglycerides: 55 mg/dL (ref <150)

## 2010-05-29 LAB — CK TOTAL AND CKMB (NOT AT ARMC)
CK, MB: 1.6 ng/mL (ref 0.3–4.0)
Relative Index: INVALID (ref 0.0–2.5)

## 2010-05-29 LAB — T4, FREE: Free T4: 1.24 ng/dL (ref 0.80–1.80)

## 2010-05-29 LAB — OPIATE, QUANTITATIVE, URINE
Codeine Urine: NEGATIVE NG/ML
Hydrocodone: NEGATIVE NG/ML
Hydromorphone GC/MS Conf: 256 NG/ML — ABNORMAL HIGH
Morphine, Confirm: 1183 NG/ML — ABNORMAL HIGH
Oxycodone, ur: NEGATIVE NG/ML
Oxymorphone: NEGATIVE NG/ML

## 2010-05-29 LAB — C3 COMPLEMENT: C3 Complement: 118 mg/dL (ref 88–201)

## 2010-05-29 LAB — C4 COMPLEMENT: Complement C4, Body Fluid: 28 mg/dL (ref 16–47)

## 2010-05-29 LAB — HEMOGLOBIN A1C
Hgb A1c MFr Bld: 6.6 % — ABNORMAL HIGH (ref <5.7)
Mean Plasma Glucose: 143 mg/dL — ABNORMAL HIGH (ref <117)

## 2010-05-29 LAB — SEDIMENTATION RATE: Sed Rate: 7 mm/hr (ref 0–22)

## 2010-05-29 LAB — CORTISOL: Cortisol, Plasma: 16.9 ug/dL

## 2010-05-29 LAB — OSMOLALITY: Osmolality: 267 mOsm/kg — ABNORMAL LOW (ref 275–300)

## 2010-05-31 NOTE — Assessment & Plan Note (Signed)
Summary: ROV PER DR. BARNES PT HAS sob/LG   Visit Type:  Follow-up Primary Provider:  Juluis Rainier, MD  CC:  shortness of breath.  History of Present Illness: The patient is seen for follow up of shortness of breath.  Her symptoms are stable.  She does have shortness of breath with walking.  However she continues about her usual activities.  She has not had chest pain.  She fell recently but it was not a syncopal episode.  She has normal LV function.  Current Medications (verified): 1)  Aspirin 81 Mg Tbec (Aspirin) .... Take One Tablet By Mouth Daily 2)  Lisinopril 20 Mg Tabs (Lisinopril) .... Take One Tablet By Mouth Daily 3)  Plavix 75 Mg Tabs (Clopidogrel Bisulfate) .... Take One Tablet By Mouth Daily 4)  Multivitamins  Tabs (Multiple Vitamin) .... Take 1 Tablet By Mouth Once A Day 5)  Januvia 100 Mg Tabs (Sitagliptin Phosphate) .... Take 1 Tablet By Mouth Once A Day 6)  Glucophage 500 Mg Tabs (Metformin Hcl) .... Take 1 Tablet By Mouth Two Times A Day 7)  Nitrostat 0.4 Mg Subl (Nitroglycerin) .Marland Kitchen.. 1 Tablet Under Tongue At Onset of Chest Pain; You May Repeat Every 5 Minutes For Up To 3 Doses. 8)  Furosemide 20 Mg Tabs (Furosemide) .... Take 1/2 Tablet By Mouth Once Daily. 9)  Lipitor 20 Mg Tabs (Atorvastatin Calcium) .... Take One Tablet By Mouth Once Daily. 10)  Glyburide 5 Mg Tabs (Glyburide) .... On Tablet Two Times A Day 11)  Acetaminophen 325 Mg  Tabs (Acetaminophen) .... As Needed 12)  Neurontin 100 Mg Caps (Gabapentin) .... Two Times A Day 13)  Nortriptyline Hcl 25 Mg Caps (Nortriptyline Hcl) .... At Bedtime  Allergies (verified): 1)  ! Codeine  Past History:  Past Medical History: CABG.. 2000,  LIMA to the LAD, SVG to the diagonal SVG to OM SVG to posterior descending CAD...last cath 2009... grafts patent... ejection fraction 70%......80% small OM possible mild ischemia a EF  70% cath...2009  /    65-70%.....echo....12/2009 HYPERTENSION (ICD-401.9) PALPITATIONS, HX  OF (ICD-V12.50) DYSLIPIDEMIA (ICD-272.4) DIABETES MELLITUS, TYPE II (ICD-250.00).Marland Kitchen PANCREATITIS, HX OF (ICD-V12.70) .. normal LV function Carotid artery disease.. mild in 2000 Dizziness... workup April 2008.. no treatment needed cervical spine disease.... severe neck pain..... June, 2011 Shortness of breath  Review of Systems       Patient denies fever, chills, headaches, sweats, rash, change in vision, change in hearing, chest pain, cough, nausea vomiting, urinary symptoms.  All other systems are reviewed and are negative.  Vital Signs:  Patient profile:   77 year old female Height:      67 inches Weight:      156.75 pounds BMI:     24.64 Pulse rate:   92 / minute BP sitting:   144 / 82  (left arm) Cuff size:   regular  Vitals Entered By: Caralee Ates CMA (May 26, 2010 9:45 AM)  Physical Exam  General:  patient is stable today. Eyes:  no xanthelasma. Neck:  no jugular venous distention. Lungs:  lungs are clear.  Respiratory effort is nonlabored. Heart:  cardiac exam reveals S1-S2.  No clicks or significant murmurs. Abdomen:  abdomen soft. Extremities:  no peripheral edema. Psych:  patient is oriented to person time and place.  Affect is normal.   Impression & Recommendations:  Problem # 1:  SHORTNESS OF BREATH (ICD-786.05) At this point her shortness of breath is stable.  I am not inclined to recommend other further  cardiac testing at this time.  I feel cardiac catheterization is not indicated.  I decided not to proceed with standard exercise testing.  A cardiopulmonary exercise test could be considered I am not sure it will yield helpful information.  Therefore further testing is not recommended.  She is encouraged to go back to full activities.  Problem # 2:  CAD (ICD-414.00)  Coronary disease is stable.  No change in therapy.    Orders: EKG w/ Interpretation (93000)  Problem # 3:  HYPERTENSION (ICD-401.9) Blood pressure stable.  No change in therapy.  Patient  Instructions: 1)  Your physician wants you to follow-up in:  6 months.  You will receive a reminder letter in the mail two months in advance. If you don't receive a letter, please call our office to schedule the follow-up appointment.

## 2010-06-21 LAB — DIFFERENTIAL
Basophils Absolute: 0 10*3/uL (ref 0.0–0.1)
Basophils Absolute: 0 10*3/uL (ref 0.0–0.1)
Basophils Relative: 1 % (ref 0–1)
Basophils Relative: 1 % (ref 0–1)
Eosinophils Absolute: 0.1 10*3/uL (ref 0.0–0.7)
Eosinophils Relative: 2 % (ref 0–5)
Lymphocytes Relative: 29 % (ref 12–46)
Monocytes Absolute: 0.5 10*3/uL (ref 0.1–1.0)
Monocytes Absolute: 0.6 10*3/uL (ref 0.1–1.0)
Monocytes Relative: 9 % (ref 3–12)
Neutro Abs: 3.7 10*3/uL (ref 1.7–7.7)
Neutrophils Relative %: 61 % (ref 43–77)

## 2010-06-21 LAB — CARDIAC PANEL(CRET KIN+CKTOT+MB+TROPI)
CK, MB: 0.9 ng/mL (ref 0.3–4.0)
Relative Index: INVALID (ref 0.0–2.5)
Total CK: 47 U/L (ref 7–177)
Total CK: 53 U/L (ref 7–177)
Troponin I: 0.01 ng/mL (ref 0.00–0.06)
Troponin I: 0.02 ng/mL (ref 0.00–0.06)

## 2010-06-21 LAB — CBC
Hemoglobin: 11.8 g/dL — ABNORMAL LOW (ref 12.0–15.0)
Hemoglobin: 12 g/dL (ref 12.0–15.0)
Hemoglobin: 12.7 g/dL (ref 12.0–15.0)
MCHC: 33.9 g/dL (ref 30.0–36.0)
Platelets: 179 10*3/uL (ref 150–400)
RBC: 3.79 MIL/uL — ABNORMAL LOW (ref 3.87–5.11)
RBC: 4.07 MIL/uL (ref 3.87–5.11)
RDW: 13 % (ref 11.5–15.5)
RDW: 13 % (ref 11.5–15.5)
WBC: 5 10*3/uL (ref 4.0–10.5)
WBC: 6.1 10*3/uL (ref 4.0–10.5)
WBC: 6.1 10*3/uL (ref 4.0–10.5)

## 2010-06-21 LAB — TROPONIN I
Troponin I: 0.01 ng/mL (ref 0.00–0.06)
Troponin I: 0.01 ng/mL (ref 0.00–0.06)

## 2010-06-21 LAB — COMPREHENSIVE METABOLIC PANEL
ALT: 18 U/L (ref 0–35)
Albumin: 3.5 g/dL (ref 3.5–5.2)
Alkaline Phosphatase: 83 U/L (ref 39–117)
Chloride: 97 mEq/L (ref 96–112)
Potassium: 4.1 mEq/L (ref 3.5–5.1)
Sodium: 133 mEq/L — ABNORMAL LOW (ref 135–145)
Total Bilirubin: 0.4 mg/dL (ref 0.3–1.2)
Total Protein: 6.8 g/dL (ref 6.0–8.3)

## 2010-06-21 LAB — HEMOGLOBIN A1C
Hgb A1c MFr Bld: 7.2 % — ABNORMAL HIGH (ref 4.6–6.1)
Mean Plasma Glucose: 160 mg/dL

## 2010-06-21 LAB — GLUCOSE, CAPILLARY
Glucose-Capillary: 100 mg/dL — ABNORMAL HIGH (ref 70–99)
Glucose-Capillary: 145 mg/dL — ABNORMAL HIGH (ref 70–99)
Glucose-Capillary: 159 mg/dL — ABNORMAL HIGH (ref 70–99)
Glucose-Capillary: 180 mg/dL — ABNORMAL HIGH (ref 70–99)

## 2010-06-21 LAB — BASIC METABOLIC PANEL
BUN: 16 mg/dL (ref 6–23)
CO2: 28 mEq/L (ref 19–32)
Chloride: 101 mEq/L (ref 96–112)
Creatinine, Ser: 1.1 mg/dL (ref 0.4–1.2)
Glucose, Bld: 209 mg/dL — ABNORMAL HIGH (ref 70–99)

## 2010-06-21 LAB — URINALYSIS, ROUTINE W REFLEX MICROSCOPIC
Glucose, UA: 100 mg/dL — AB
Hgb urine dipstick: NEGATIVE
Protein, ur: NEGATIVE mg/dL
Specific Gravity, Urine: 1.016 (ref 1.005–1.030)
pH: 7.5 (ref 5.0–8.0)

## 2010-06-21 LAB — POCT I-STAT, CHEM 8
BUN: 18 mg/dL (ref 6–23)
Calcium, Ion: 1.16 mmol/L (ref 1.12–1.32)
Creatinine, Ser: 1.3 mg/dL — ABNORMAL HIGH (ref 0.4–1.2)
Glucose, Bld: 203 mg/dL — ABNORMAL HIGH (ref 70–99)
TCO2: 26 mmol/L (ref 0–100)

## 2010-06-21 LAB — CK TOTAL AND CKMB (NOT AT ARMC)
Relative Index: INVALID (ref 0.0–2.5)
Total CK: 51 U/L (ref 7–177)

## 2010-06-21 LAB — LIPID PANEL
HDL: 33 mg/dL — ABNORMAL LOW (ref >39)
Total CHOL/HDL Ratio: 4.6 RATIO
Triglycerides: 381 mg/dL — ABNORMAL HIGH (ref <150)
VLDL: 76 mg/dL — ABNORMAL HIGH (ref 0–40)

## 2010-06-21 LAB — BRAIN NATRIURETIC PEPTIDE: Pro B Natriuretic peptide (BNP): 44 pg/mL (ref 0.0–100.0)

## 2010-06-21 LAB — POCT CARDIAC MARKERS: CKMB, poc: <1 ng/mL — ABNORMAL LOW (ref 1.0–8.0)

## 2010-06-21 LAB — APTT: aPTT: 30 seconds (ref 24–37)

## 2010-06-21 LAB — PROTIME-INR
INR: 1 (ref 0.00–1.49)
Prothrombin Time: 12.9 seconds (ref 11.6–15.2)

## 2010-06-21 LAB — URINE MICROSCOPIC-ADD ON

## 2010-07-26 NOTE — Assessment & Plan Note (Signed)
[facility]                            CARDIOLOGY OFFICE NOTE   [name], [name]                       MRN:          [id number]  DATE:05/22/2007                            DOB:          12/29/1928    The patient was admitted to [facility].  See the discharge summary.  Ultimately, catheterization was done by Dr. Riley Kill very carefully.  All  of her grafts are patent.  Her LV function is good.  She has a 75-80%  native small OM1.  Dr. Riley Kill will be reviewing further, but we think  she had disease in this vessel before.  It is a small vessel.  Her  enzymes were negative.  Overall, her chest pain was concerning, but we  have no proof that it was definitely ischemic.  Considering all of these  issues it is most appropriate not to intervene on this small OM1.  Dr.  Riley Kill and I are in agreement.  I have chosen not to change her  medicines.  I will see her back early in the office for further  followup.  Reassurance is very important.     Luis Abed, MD, [facility]  Electronically Signed    JDK/MedQ  DD: 05/22/2007  DT: 05/22/2007  Job #: [id number]

## 2010-07-26 NOTE — Assessment & Plan Note (Signed)
[facility]                            CARDIOLOGY OFFICE NOTE   DALEYSA, [name]                       MRN:          [id number]  DATE:12/28/2006                            DOB:          [birth year]    [name] is seen for cardiology followup.  See my complete note of  July 06, 2006, and there is an additional addendum that was dictated on  July 06, 2006.  From the cardiac viewpoint she has been stable.  She  may have had some slight chest discomfort, but I believe that overall  she is quite stable.   PAST MEDICAL HISTORY:   ALLERGIES:  Question of METRONIDAZOLE.   MEDICATIONS:  1. Aspirin.  2. Prinivil.  3. Lasix.  4. Plavix.  5. Toprol.  6. Glyburide.  7. Imdur.  8. Metformin ?  9. Lipitor.  10.Multivitamin.  11.Glucophage.   OTHER MEDICAL PROBLEMS:  See the list below.   REVIEW OF SYSTEMS:  She has a slight headache from time to time and some  mild shortness of breath, but overall her review of systems is negative.   PHYSICAL EXAMINATION:  Weight is 156 pounds, blood pressure is 144/80  with a pulse of 70.  The patient is oriented to person, time, and place.  Affect is normal.  HEENT:  Reveals no xanthelasma.  She has normal extraocular motion.  She  does have thinning of her hair.  There are no carotid bruits, there is no jugular venous distension.  LUNGS:  Clear.  Respiratory effort is not labored.  CARDIAC EXAM:  Reveals an S1 with and S2.  There are no clicks or  significant murmurs.  She has normal bowel sounds.  She has no significant peripheral edema.   EKG reveals sinus rhythm.  She has a nonspecific ST-T wave changes.   PROBLEM LIST:  1. Diabetes.  2. Hypercholesterolemia.  3. Coronary artery disease.  At the time of her last cath her grafts      were patent.  She is post coronary artery bypass graft in 2000.      She received left internal mammary artery to the left anterior      descending, vein graft to the  diagonal, and vein graft to obtuse      marginal, and vein graft to the posterior descending artery.  4. History of normal left ventricular function.  5. Episode in 2005 with elevated enzymes and EKG changes.  Etiology      was never clear.  6. Status post hysterectomy and cholecystectomy.  7. History of pancreatitis.  8. Right groin hematoma post cath in 2008 that resolved.  9. Mild palpitations.  10.Workup by MRI MRA with findings as outlined previously.   Her cardiac status is stable.  No further workup or testing at this  time.     Luis Abed, MD, [facility]  Electronically Signed    JDK/MedQ  DD: 12/28/2006  DT: 12/29/2006  Job #: [id number]   cc:   C. Duane Lope, M.D.

## 2010-07-26 NOTE — Cardiovascular Report (Signed)
NAMEMARGO, [name]              ACCOUNT NO.:  [id number]   MEDICAL RECORD NO.:  [id number]          PATIENT TYPE:  INP   LOCATION:  2036                         FACILITY:  [facility]   PHYSICIAN:  Arturo Morton. Riley Kill, MD, FACCDATE OF BIRTH:  10/23/1928   DATE OF PROCEDURE:  05/21/2007  DATE OF DISCHARGE:                            CARDIAC CATHETERIZATION   INDICATIONS:  The patient is 78 years, has previously undergone  revascularization surgery.  She presented with a severe episode of chest  pain.  Her cardiac enzymes are negative.   PROCEDURE:  1. Left heart catheterization.  2. Selective coronary arteriography.  3. Selective left ventriculography.   DESCRIPTION OF PROCEDURE:  The patient was brought to the  catheterization laboratory, prepped and draped in usual fashion.  Through an anterior puncture, the right femoral artery was easily  entered.  A 5-French sheath was placed.  Following this, views of the  left and right coronary arteries were obtained.  The RCA vein graft was  obtained.  The internal mammary was obtained.  We could not reach the  left bypass with left bypass catheter.  We also tried a multi purpose  catheter.  Ultimately, we upgraded to a 6-French sheath, and views of  the arteries were obtained with a left bypass guide.  Central aortic and  left ventricular pressures were measured with pigtail.  Ventriculography  was performed in the RAO projection.  Following pressure pullback an  aortic root shot was obtained.  There were no complications.  The  patient was taken to the holding area in satisfactory clinical  condition.  I followed this by talking to the patient's family.   HEMODYNAMIC DATA:  1. Central aortic pressure was 172/74.  2. Left ventricular pressure 173/22.  3. There was no gradient pullback across the aortic valve.   ANGIOGRAPHIC DATA:  1. The left main is free of critical disease.  2. The LAD is basically subtotally occluded in its midportion  and      gives off a small ramus intermedius and a small diagonal.  The      diagonal takeoff is not well seen.  3. The saphenous vein graft to the diagonal appears to be intact.  4. The internal mammary to the distal LAD appears to be intact, and      functioned normally.  5. The circumflex demonstrates a first marginal of about 75-80%      narrowing.  It is a somewhat smaller caliber vessel with an upward      takeoff that is fairly small distribution.  Just after this      takeoff, there is 50% narrowing and there is a second marginal that      has subtotal occlusion of the distal vessel fills by a patent vein      graft.  6. The saphenous vein graft to the obtuse marginal is intact.  7. The right coronary artery demonstrates tandem 80% stenoses then      totally occluded.  8. The saphenous vein graft to the PDA filling the PDA and PDA      retrograde into  the posterolateral is widely patent.  9. Ventriculography in the RAO projection reveals vigorous global      systolic function.  Ejection fraction is felt to be greater than      55%.  10.The aortic root does not demonstrate significant aortic      regurgitation.  There may be mild enlargement, but does not appear      to be aneurysmal and no obvious evidence of dissection is noted.   CONCLUSION:  1. Preserved left ventricular function.  2. No significant aortic regurgitation.  3. Continued patency of the internal mammary to the LAD, saphenous      vein graft to the diagonal, saphenous vein graft to the OM-2, and      saphenous vein graft to the PDA.  4. Moderately high-grade stenosis of a relatively small first obtuse      marginal branch.   DISPOSITION:  I will review the options with Dr. Myrtis Ser.  Whether or not  the source of symptoms is due to this marginal branch is unclear.  We  will discuss options.      Arturo Morton. Riley Kill, MD, [facility]  Electronically Signed     TDS/MEDQ  D:  05/21/2007  T:  05/22/2007  Job:   [id number]   cc:   Luis Abed, MD, [facility]  CV Laboratory

## 2010-07-26 NOTE — Assessment & Plan Note (Signed)
[name]                            CARDIOLOGY OFFICE NOTE   OTHELLA, SLAPPEY                       MRN:          [id number]  DATE:01/02/2008                            DOB:          1928-10-28    [name] is here for followup.  She has known coronary artery disease.  She had some problems with hair loss and a decision was made to hold her  beta-blocker.  We brought her back today to be sure that she was not  having any cardiac symptoms off of her beta-blocker.   PAST MEDICAL HISTORY:   ALLERGIES:  Question of CORTISONE.   MEDICATIONS:  Aspirin, Lipitor, Prinivil (she says she has actually been  taking 10 mg b.i.d. and did not change the dose since I saw her last),  Plavix, Imdur, Januvia, and Glucophage.   OTHER MEDICAL PROBLEMS:  See the list on my note of June 11, 2007.   REVIEW OF SYSTEMS:  She is actually feeling well today.  She does  describe an unusual episode several weeks ago.  She describes an episode  of feeling numbness in her body.  This resolved and she has had no  recurrence.  Otherwise, her review of systems is negative.   PHYSICAL EXAMINATION:  VITAL SIGNS:  Blood pressure is 128/82 with a  pulse of 79.  GENERAL:  The patient is oriented to person, time, and place.  Affect  normal.  HEENT:  Reveals no xanthelasma.  She has normal extraocular motion.  NECK:  There are no carotid bruits.  There is no jugular venous  distention.  LUNGS:  Clear.  Respiratory effort is nonlabored.  CARDIAC:  Reveals an S1 with an S2.  There are no clicks or significant  murmurs.  ABDOMEN:  Soft.  EXTREMITIES:  She has no peripheral edema.   No labs are done.   Problems are listed on the note of June 11, 2007.  Her coronary status  is stable.  We now have her off beta-blockers.  Blood pressure is  stable.  We will have to defer time to see if this stopping up the beta-  blocker helps with her hair loss.     Sara Abed, MD,  [facility]  Electronically Signed    JDK/MedQ  DD: 01/02/2008  DT: 01/02/2008  Job #: [id number]   cc:   [name], M.D.

## 2010-07-26 NOTE — H&P (Signed)
NAMEELIZAH, LYDON NO.:  [id number]   MEDICAL RECORD NO.:  [id number]          PATIENT TYPE:  EMS   LOCATION:  MAJO                         FACILITY:  [facility]   PHYSICIAN:  Marca Ancona, MD      DATE OF BIRTH:  [birth year]   DATE OF ADMISSION:  07/26/2008  DATE OF DISCHARGE:                              HISTORY & PHYSICAL   PRIMARY CARDIOLOGIST:  Luis Abed, MD, [facility]   PRIMARY CARE PHYSICIAN:  C. Duane Lope, MD   HISTORY OF PRESENT ILLNESS:  Ms. Banbury is a 77 year old Caucasian  female with known history of coronary artery disease status post CABG in  2000.  Most recent catheterization in 2009 in the setting of chest  discomfort.  The patient was found to have patent grafts with new  moderate stenosis in the small first OM branch.  Dr. Riley Kill and Dr.  Myrtis Ser decided to treat with medical therapy at that time.  Ms. Jetter  presents today with complaints of chest discomfort brought in by EMS.  She states she had been more fatigued, less energy x2 weeks, and then  Friday she began having gradual onset of chest discomfort.  It starts in  a sharp discomfort and then becomes a dull mid sternal chest pain  radiating through to her right arm and through to her back.  She  experienced some shortness of breath and nausea with it.  First she  thought it was indigestion, she tried Mylanta and Alka-Seltzer but that  did not make any difference with it.  She then decided to try some  nitroglycerin.  This seemed to resolve it at that time.  She is  continued to have intermittent chest discomfort throughout the day  yesterday and went to bed last night with pain-free, around 4:00 a.m.  chest discomfort woke her up.  Once again, she was short of breath and  nauseated.  She called EMS, was given nitroglycerin with some relief.  Here in the ER, a 12-lead EKG showed no acute findings.  She was treated  with and more nitroglycerin Zofran and morphine and was able to obtain  pain relief.   PAST MEDICAL HISTORY:  1. Coronary artery disease status post CABG in 2000.  She had LIMA to      the LAD, saphenous vein graft to the diagonal, saphenous vein graft      to the obtuse, and saphenous vein graft to the PDA.  Most recent      catheterization in March 2009, she had patent grafts with minor      stenosis in a small first OM with recommendations for medical      therapy.  Normal LV function.  2. Diabetes.  3. Dyslipidemia.  4. Palpitations.  5. Previous tobacco use.  6. Remote history of pancreatitis.  7. Hypertension.   SOCIAL HISTORY:  She is divorced.  She lives in [place] alone.  She  has adult children.  She is a retired Film/video editor.  She quit smoking 40  years ago.  Denies any EtOH, drug, or herbal medication use.  She tries  to follow a ADA heart-healthy diet.  She does some occasional walking  for exercise.   FAMILY HISTORY:  Mother deceased in her 79s from cancer.  Father  deceased in his 33s from heart failure, although he had a diagnosis of  coronary artery disease in his 39s.  She has siblings who are deceased  from cancer and from heart failure.   REVIEW OF SYSTEMS:  Positive for chest pain, shortness of breath,  occasional palpitations, occasional dizziness, increased weakness x2  weeks, nausea associated with chest discomfort.  All other systems  reviewed and negative.   ALLERGIES/INTOLERANCES:  The patient cannot take a BETA-BLOCKER because  of hair loss.  CODEINE and CORTISONE make her nauseated.  She has an  allergy to FLAGYL, unclear as to what it is.   CURRENT MEDICATIONS:  1. Aspirin 81.  2. Lipitor 20.  3. Prinivil 10 b.i.d.  4. Lasix 20 daily.  5. Plavix 75.  6. Glyburide 5 t.i.d.  7. Imdur 30.  8. Glucophage 500 b.i.d.  9. Januvia 100 mg daily.   PHYSICAL EXAMINATION:  VITAL SIGNS:  Temperature 98.2, heart rate 72,  respirations 16, and blood pressure 179/89.  GENERAL:  In no acute distress, currently pain-free.   HEENT:  Unremarkable.  NECK:  Supple without bruits or JVD.  CARDIOVASCULAR:  S1-S2.  Regular rate and rhythm.  ABDOMEN:  Soft, nontender, positive bowel sounds.  LOWER EXTREMITIES:  Without clubbing, cyanosis, or edema.  NEUROLOGIC:  Alert and oriented x3.   LABORATORY DATA:  Chest x-ray showing questionable underlying COPD.  Otherwise, no acute findings.  EKG sinus rhythm with PACs at a rate 86.  Point-of-care is negative x1.  First set of cardiac enzymes negative.  BNP 44.  Hematocrit 39.  Potassium 5.1, creatinine 1.3, and glucose 203.   IMPRESSION:  Chest pain, similar to pain prior to cardiac  catheterization in 2009 in a patient with known coronary artery disease,  previous bypass.  Also with diabetes, hypertension, and dyslipidemia.  The patient will be admitted.  We will rule out with cardiac serial  markers and we are going to increase her Imdur to 60, start her on  heparin, continue her statin, aspirin, and Plavix, ACE inhibitor, no  beta-blocker because of hair loss.  We will have Dr. Myrtis Ser see the  patient in the a.m. and make final decision regarding plan of care.  Dr.  Shirlee Latch has been in to examine and assess the patient and agrees with  plan of care.      Dorian Pod, ACNP      Marca Ancona, MD  Electronically Signed    MB/MEDQ  D:  07/26/2008  T:  07/27/2008  Job:  [id number]

## 2010-07-26 NOTE — Assessment & Plan Note (Signed)
[facility]                            CARDIOLOGY OFFICE NOTE   [name], [name]                       MRN:          [id number]  DATE:06/11/2007                            DOB:          31-Aug-1928    [name] was recently hospitalized with chest pain.  There was no MI.  Catheterization showed no significant change.  The study was done very  carefully by Dr. Riley Kill.  All of her grafts are patent.  She has a very  small native OM1 with a 75-80% lesion and it is possible that she could  have some ischemia in this area.  We think this is unchanged from the  past.  This is a very small vessel and she does not have any significant  muscle at risk.  Reassurance is very important.  I have seen her back in  the office today.  She has not had return of any significant symptoms.  She is going about full activities.   PAST MEDICAL HISTORY:   ALLERGIES:  CORTISONE.   MEDICATIONS:  1. Aspirin.  2. Lipitor.  3. Prinivil.  4. Plavix.  5. Toprol.  6. Imdur.  7. Multivitamin  8. Januvia.  9. Prandin.   OTHER MEDICAL PROBLEMS:  See the list below.   REVIEW OF SYSTEMS:  Her review of systems today is negative.   PHYSICAL EXAM:  Blood pressure is 130/78 with a pulse 64.  The patient  is oriented to person, time and place.  Affect is normal.  HEENT:  Reveals no xanthelasma.  She has normal extraocular motion.  There no  carotid bruits.  There is no jugular venous distention.  LUNGS:  Clear.  Respiratory effort is not labored.  CARDIAC:  Reveals an S1-S2.  There are no clicks or significant murmurs.  ABDOMEN:  Soft.  She has no peripheral edema.   EKG shows old diffuse nonspecific ST-T wave changes.   PROBLEMS INCLUDE:  1. Diabetes.  2. Hypercholesterolemia.  3. Coronary disease post coronary artery bypass graft.  Her recent      cath showed that her grafts were patent.  She has a very small OM1,      but they have been narrowed in the past also.   It is possible that      she get slight ischemia from this, but I am not inclined to change      her medicines.  4. Normal LV function.  5. Episode in 2005 with elevated enzymes and EKG changes, the etiology      was never clear.  6. Status post hysterectomy.  7. Status post cholecystectomy.  8. History of pancreatitis in the past.  9. History of a groin hematoma post cath in 2008 that resolved.  10.Mild palpitations.  11.Workup by MRI, MRA with findings that were outlined previously.      She had some dizziness.  See the description of these findings and      my note of July 06, 2006.  Ultimately, no other treatment was      needed.   She is stable.  I will see her back in 6 months.     Sara Abed, MD, [facility]  Electronically Signed    JDK/MedQ  DD: 06/11/2007  DT: 06/11/2007  Job #: [id number]

## 2010-07-26 NOTE — Discharge Summary (Signed)
NAMEPINKI, [name]              ACCOUNT NO.:  [id number]   MEDICAL RECORD NO.:  [id number]          PATIENT TYPE:  OBV   LOCATION:  2036                         FACILITY:  [facility]   PHYSICIAN:  Luis Abed, MD, FACCDATE OF BIRTH:  1928-12-07   DATE OF ADMISSION:  05/20/2007  DATE OF DISCHARGE:  05/22/2007                               DISCHARGE SUMMARY   PRIMARY CARDIOLOGIST:  Luis Abed, MD, [facility].   PRIMARY CARE PHYSICIAN:  C. Duane Lope, M.D.   PROCEDURES PERFORMED DURING HOSPITALIZATION:  Cardiac catheterization  completed by Dr. Bonnee Quin on May 21, 2007 revealing normal LV  function with no significant aortic insufficiency.  Patent LIMA to LAD.  Patent SVG to OM, diagonal, and PDA.  A 75-80% small obtuse marginal.  EF of 55%.   FINAL DISCHARGE DIAGNOSES:  1. Angina.  2. Known coronary artery disease.      a.     Status post coronary artery bypass grafting in 2000.  LIMA       to LAD, vein graft to diagonal, vein graft to obtuse marginal, and       vein graft to posterior descending artery.  3. Diabetes.  4. Hypercholesterolemia.  5. History of palpitations.   HOSPITAL COURSE:  This is a 76 year old Caucasian female well known to  Dr. Myrtis Ser with complaints of chest pain which started around 9 p.m. on  Saturday evening, sudden onset, while sitting and watching television.  She described it as mid sternal, radiating to the back and to the right  arm and has associated nausea.  Took nitroglycerin x3 along with aspirin  with no relief.  She took three additional nitroglycerin and felt very  lightheaded, wheezing, and had a near-syncopal episode.  When she came  to, the patient had no complaints of pain and went to bed.  The  following day, the patient felt weak, washed out, complained of  soreness, and intermittent discomfort in her chest which she described  as sharp in the mid sternal region with no radiation.  The patient was  seen and examined by Ward Givens,  nurse practitioner, and was also seen  by Dr. [place] Bing.  The patient was admitted to rule out myocardial  infarction in the setting of known CAD and recurrence of symptoms.  The  patient was admitted with cardiac enzymes to be cycled and monitoring of  patient's blood pressure response.  The patient was found to be a  candidate for a cardiac catheterization, as she had recurrent symptoms  with known CAD.   The patient's troponins were found to be negative x3.  She was seen the  following morning by Dr. Willa Rough and found to be stable.  Cardiac  catheterization was planned for May 21, 2007 with results described  per Dr. Rosalyn Charters cardiac catheterization dictation, please see that for  more details.  It was found that the patient had patent grafts and 75-  80% small OM, which was recommended to be treated medically by Dr.  Riley Kill in discussion with Dr. Myrtis Ser.  The patient will be returned home  on  the current medication regimen.  The patient will follow up with Dr.  Myrtis Ser in approximately two weeks for continued cardiac management.  The  patient has been advised of the need to treat medically at this time.   DISCHARGE LABS:  Sodium 139, potassium 4, BUN 9, creatinine 0.97, blood  pressure 125/63 with a heart rate of 75.  Troponins were found to be  negative x3.   DISCHARGE MEDICATIONS:  1. Aspirin 81 mg daily.  2. Lipitor 10 mg daily.  3. Lasix 10-20 mg as needed for hypertension.  4. Plavix 75 mg daily.  5. Toprol XL 50 mg daily.  6. Imdur 30 mg daily.  7. Multivitamin daily.  8. Januvia 100 mg daily.  9. Prandin 0.5 mg before snacks and 2 mg before each meal.   ALLERGIES:  CODEINE, CORTISOL, FLAGYL.   FOLLOW-UP PLANS/APPOINTMENTS:  1. Patient is to follow up with Dr. Willa Rough on Tuesday, March 31      at 3:30 p.m.  2. Patient is to follow up with her primary care physician for      continued medical management of diabetes.  3. Patient is given post cardiac  catheterization instructions with      particular emphasis on the right groin site for evidence of      hematoma, swelling, or infection.   Time spent with the physician, to include physician time, 35 minutes.      Bettey Mare. Lyman Bishop, NP      Luis Abed, MD, [facility]  Electronically Signed    KML/MEDQ  D:  05/22/2007  T:  05/22/2007  Job:  [id number]

## 2010-07-26 NOTE — Assessment & Plan Note (Signed)
[facility]                            CARDIOLOGY OFFICE NOTE   [name], [name]                       MRN:          [id number]  DATE:12/04/2007                            DOB:          1928-09-09    [name] is here for followup.  I saw her last in March 2009.  She has  actually done well.  She is not having any shortness of breath.  She has  some very slight chest pain at times.  I am inclined just to follow  this.  It does not sound like angina.   Her major complaint today is hair loss.  We know historically that  sometimes beta blockers may be implicated.  We will try to hold her beta  blocker and adjust her other medicines.   PAST MEDICAL HISTORY:   ALLERGIES:  CORTISONE.   MEDICATIONS:  Aspirin, Lipitor, Prinivil, Plavix, Toprol (to be held),  Imdur, multivitamin, Januvia, and Glucophage.   OTHER MEDICAL PROBLEMS:  See the list on my note of June 11, 2007.   REVIEW OF SYSTEMS:  Other than the HPI her review of systems is  negative.   PHYSICAL EXAMINATION:  VITAL SIGNS:  Weight is 160.  This is stable for  her.  Blood pressure is 140/72 and this is slightly higher than usual.  Pulse of 70.  GENERAL:  The patient is oriented to person, time, and place.  Affect is  normal.  HEENT:  Reveals no xanthelasma.  She has normal extraocular motion.  NECK:  There are no carotid bruits.  There is no jugular venous  distention.  LUNGS:  Clear.  Respiratory effort is not labored.  CARDIAC:  S1 and S2.  There are no clicks or significant murmurs.  ABDOMEN:  Soft.  There is no peripheral edema.   No labs were done today.   Problems are listed on my note of June 11, 2007.  #12.  Hair loss.  We will wean her Toprol down to 12.5 for a week  followed by stopping it.  In the meantime her Prinivil be increased to  20 mg daily.  I will see her for early followup to be sure that she is  not having any change in her chest pain and to be sure that  her blood  pressure is stable.     Luis Abed, MD, [facility]  Electronically Signed    JDK/MedQ  DD: 12/04/2007  DT: 12/05/2007  Job #: [id number]

## 2010-07-26 NOTE — H&P (Signed)
NAMEJOLI, KOOB              ACCOUNT NO.:  [id number]   MEDICAL RECORD NO.:  [id number]           PATIENT TYPE:   LOCATION:                                 FACILITY:   PHYSICIAN:  Gerrit Friends. Dietrich Pates, MD, FACCDATE OF BIRTH:  [birth year]   DATE OF ADMISSION:  05/20/2007  DATE OF DISCHARGE:                              HISTORY & PHYSICAL   PRIMARY CARDIOLOGIST:  Luis Abed, MD,   PRIMARY CARE Deveion Denz:  Dr. Freda Jackson.   PATIENT PROFILE:  A 77 year old Caucasian female, prior history of CAD,  status post CABG, presents to the ED following  intermittent chest pain  since Saturday.   PROBLEM LIST:  1. Unstable angina/CAD.      a.     Status post CABG x4 in 2000 with placement a LIMA to the       LAD, vein graft to the diagonal, vein graft to the obtuse       marginal, and vein graft to the PDA.      b.     April 19, 2006, cardiac catheterization.  Left main       normal.  LAD 100% mid.  Left circumflex 40% ostial and 30% mid.       OM-1 70-80%.  OM-2 100%.  RCA:  Diffuse 70% throughout with 100%       distal stenosis.  EF 70%.  Four of four grafts were patent.  2. Hypertension.  3. Hyperlipidemia.  4. Diabetes mellitus.  5. History of pancreatitis.  6. Status post hysterectomy.  7. Status post cholecystectomy.  8. History of palpitations.   ALLERGIES:  CORTISONE causes GI upset,  Codeine causes nausea,   HISTORY OF PRESENT ILLNESS:  A 77 year old Caucasian female with the  above problem list.  She was in her usual state of health until  approximately 9 p.m. this past Saturday night, when she was sitting and  developed sudden onset of severe mid to upper sternal chest tightness  and squeezing associated with marked diaphoresis and flushing as well as  dyspnea.  She took three sublingual nitroglycerin without change in  symptoms and then an aspirin.  When symptoms persisted, she took three  additional sublingual nitroglycerin and then after approximately 2 hours  or so  she thinks that she fell off to sleep.  She woke up approximately  3 hours later symptom-free.  Yesterday morning she was feeling sort of  weak and washed out and noted intermittent sharp, shooting lower sternal  to epigastric discomfort lasting just a couple of seconds and resolving  spontaneously without associated symptoms.  She noticed that the sharp,  shooting symptoms that she has had since yesterday morning are not at  all similar to what she had Saturday and that she has not experienced  chest pain like what she had on Saturday since prior to her bypass  surgery in 2000.  Today she called in to our office and was advised to  present to the ED for evaluation.  She is currently pain-free.   HOME MEDICATIONS:  1. Aspirin 81 mg daily.  2. Prinivil  10 mg b.i.d.  3. Lasix 20 mg p.r.n.  4. Plavix 75 mg daily.  5. Toprol XL 50 mg daily.  6. Imdur 30 mg daily.  7. Lipitor 20 mg daily.  8. Prandin 0.5 mg with snacks, 2 mg with meals.  9. Januvia 100 mg daily.   FAMILY HISTORY:  Mother died at age 43 with cancer.  Father died at 14  with heart failure.  She has two brothers, one dying with cancer and the  other with heart disease, two sisters dying of cancer and heart failure.   SOCIAL HISTORY:  She lives in [place] by herself.  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  She denies any tobacco, alcohol or drug use.  She walks occasionally but otherwise does not exercise or adhere to any  specific diet.   REVIEW OF SYSTEMS:  Positive for headache, nasal discharge.  CHEST:  Chest pain, shortness breath, flushing, palpitations.  She has chronic  two or three-pillow orthopnea.  She had some weakness and general  malaise since yesterday.  She thinks she has been having dark and tarry  stools.  She has occasional indigestion.  Otherwise all systems are  reviewed and negative.   PHYSICAL EXAM:  Temperature 98, heart rate 59, respirations 24, blood  pressure currently 178/87.  It was  greater than 200 earlier.  Pulse  oximetry 100%on 2 L.  A pleasant white female in no acute distress.  Awake, alert and alert  x3.  HEENT:  Normal.  NEUROLOGIC:  Grossly intact, nonfocal.  SKIN:  Warm and dry without wheezes or masses.  NECK:  No bruits or JVD.  LUNGS:  Respirations regular and unlabored.  Clear to auscultation.  CARDIAC:  Regular S1 and S2.  No S3, S4 or murmurs.  ABDOMEN:  Round, soft, nontender, nondistended.  Bowel sounds present  x4.  EXTREMITIES:  Warm, dry, pink.  No clubbing, cyanosis or edema.  Dorsalis pedis and posterior tibial pulses 2+ and equal bilaterally.   Chest x-ray shows stable without acute disease.  EKG shows sinus rhythm  at a rate 69 of beats per minute, normal axis, no acute ST-T changes.  Lab work:  Hemoglobin 12.7, hematocrit 37.2, WBC 4.6, platelets 195.  Sodium 136, potassium 4.3, chloride 100, CO2 26, BUN 15, creatinine  1.12, glucose 312.  CK 67.8, MB less than 1.0, troponin-I less than 0.5.  Lipase 44.  Fecal occult blood is negative.   ASSESSMENT/PLAN:  1. Unstable angina/coronary artery disease.  The patient had an      episode of chest discomfort lasting approximately 2 hours on      Saturday similar to previous angina.  Symptoms eventually resolved      after a total of six nitroglycerin and an aspirin.  Despite      prolonged duration of symptoms, her first troponin is negative here      and ECG shows no acute changes.  Notably, she is hypertensive here      although she says that her pressure generally runs 120s to 130s at      home.  We will plan to admit and cycle cardiac markers.  With a      history of coronary artery disease and the similarity of her      symptoms currently to what she had in 2000, we will plan on a left      heart cardiac catheterization in the a.m. to rule out graft or  progression of native-vessel disease.  Will add heparin and nitrate      and continue aspirin, Plavix, beta blocker and ACE  inhibitor      therapy.  2. Hypertension.  She told us that her pressure normally is 120-130      and that it was actually 99 systolic this morning.  When she came      into to the emergency department, her systolic was 215, now down to      178/87 without any therapy.  We will initiate IV nitroglycerin at      10/mcg/min. and titrate for blood pressure as well as resume her      home medications.  With her symptoms of flushing, chest discomfort      and diaphoresis and noting elevated blood pressures, we will send      out a 24-hour urine for VMA, metanephrine, normetanephrine and      catecholamines to rule out pheochromocytoma.  3. Hyperlipidemia.  Check lipids and LFTs.  Continue statin.  4. Diabetes mellitus.  Continue Prandin and Januvia, hold when n.p.o.      And sliding scale insulin and check CBGs.  5. History of gastroesophageal reflux disease.  I will add a PPI.      Nicolasa Ducking, ANP      Gerrit Friends. Dietrich Pates, MD, [facility]  Electronically Signed    CB/MEDQ  D:  05/20/2007  T:  05/21/2007  Job:  [id number]

## 2010-07-26 NOTE — Discharge Summary (Signed)
[name], [name]              ACCOUNT NO.:  [id number]   MEDICAL RECORD NO.:  [id number]          PATIENT TYPE:  INP   LOCATION:  4731                         FACILITY:  [facility]   PHYSICIAN:  Veverly Fells. Excell Seltzer, MD  DATE OF BIRTH:  10-17-1928   DATE OF ADMISSION:  07/26/2008  DATE OF DISCHARGE:  07/28/2008                               DISCHARGE SUMMARY   PROCEDURE:  CT angiogram of the chest.   PRIMARY FINAL DISCHARGE DIAGNOSIS:  Chest pain, cardiac enzymes negative  for myocardial infarction and CT angiogram of the chest showing no acute  findings, possible reflux.   SECONDARY DIAGNOSES:  1. Status post aortocoronary bypass surgery in 2000, with left      internal mammary artery to left anterior descending, saphenous vein      graft to diagonal, saphenous vein graft to obtuse marginal, and      saphenous vein graft to posterior descending artery.  2. Status post cardiac catheterization in 2009, showing all grafts      patent and an ejection fraction of 70%.  3. Diabetes with a hemoglobin A1c of 7.2% this admission.  4. Dyslipidemia with a total cholesterol of 151, triglycerides 381,      HDL 33, and LDL 42 this admission.  5. History of palpitations.  6. Remote history of tobacco use.  7. Remote history of pancreatitis.  8. Hypertension.  9. Family history of coronary artery disease in her father.   ALLERGY OR INTOLERANCE:  1. BETA-BLOCKER.  2. CODEINE.  3. CORTISONE.  4. FLAGYL.   TIME AT DISCHARGE:  41 minutes.   HOSPITAL COURSE:  [name] is a 77 year old female with known coronary  artery disease and preserved left ventricular function.  She had chest  pain in 2009, and the cath was cleaned.  She had chest pain and came to  the hospital where she was admitted for further evaluation and  treatment.  [name] cardiac enzymes were negative for MI.  She had  a D-dimer, which was slightly elevated at 0.69, so a CT angiogram was  done, which showed no significant  abnormalities.  A lipid profile was  described above.  A TSH is within normal limits at 2.605 and urinalysis  showed 100 glucose, 15 ketones, moderate leukocytes, and few squamous  epithelial cells, but no bacteria.  Her white count was within normal  limits.  She had a mild hyponatremia with a sodium of 133 and this will  be followed.  She had no significant anemia.  Her symptoms had been  somewhat improved by Maalox or Mylanta, and she was started on Protonix.  By Jul 28, 2008, [name] was chest pain free.  Dr. Excell Seltzer felt that  she could continue the Protonix as an outpatient and be followed in the  office.  Myoview is to be reserved for recurrent symptoms and she is to  otherwise continue her home medications and follow up with Dr. Myrtis Ser and  Dr. Tenny Craw.   DISCHARGE INSTRUCTIONS:  Her activity level is to be increased  gradually.  She is to stick to a low-sodium diabetic  diet.  She is to  follow up with the PA for Dr. Myrtis Ser on Aug 05, 2008, at 9:45.  She is to  follow up with Dr. Myrtis Ser on September 17, 2008, at 8:30.  She is to follow up  with Dr. Tenny Craw as needed.   DISCHARGE MEDICATIONS:  1. Aspirin 81 mg daily.  2. Lipitor 20 mg a day.  3. Prinivil 10 mg b.i.d.  4. Plavix 75 mg daily.  5. Imdur 30 mg a day.  6. Januvia 100 mg daily.  7. Glucophage 500 mg b.i.d. is on hold until Jul 29, 2008.  8. Glyburide 5 mg t.i.d.  9. Lasix 20 mg a day.  10.Multivitamin daily.  11.Protonix 40 mg a day.      Theodore Demark, PA-C      Veverly Fells. Excell Seltzer, MD  Electronically Signed    RB/MEDQ  D:  07/28/2008  T:  07/28/2008  Job:  [id number]   cc:   C. Duane Lope, M.D.

## 2010-07-29 NOTE — Cardiovascular Report (Signed)
[name], [name]              ACCOUNT NO.:  [id number]   MEDICAL RECORD NO.:  [id number]          PATIENT TYPE:  INP   LOCATION:  2923                         FACILITY:  [facility]   PHYSICIAN:  Jonelle Sidle, M.D. LHCDATE OF BIRTH:  [birth year]   DATE OF PROCEDURE:  09/29/2004  DATE OF DISCHARGE:                              CARDIAC CATHETERIZATION   PRIMARY CARDIOLOGIST:  Dr. Willa Rough.   INDICATIONS:  Ms. Monts is a 77 year old woman with known coronary artery  disease status post previous coronary bypass grafting in 2000 including a  LIMA to the left anterior descending, saphenous vein graft to the obtuse  marginal, saphenous vein graft to diagonal, a saphenous vein graft the  posterior descending branch of the right coronary artery. She is admitted to  the hospital now with chest and abdominal discomfort, and at this point has  ruled out for myocardial infarction. She is referred for diagnostic coronary  angiography to assess anatomy of her native coronaries and bypass grafts as  well and left ventricular function.   PROCEDURES PERFORMED:  1.  Left heart catheterization  2.  Selective coronary angiography.  3.  Selective bypass graft angiography.  4.  Left ventriculography.   ACCESS AND EQUIPMENT:  The area about the right femoral artery was  anesthetized with 1% lidocaine and A 6-French sheath was placed in the right  femoral artery via the modified Seldinger technique. Standard preformed 6-  [place] JL-4 and JR-4 catheters were used for selective coronary angiography  with the addition of an Amplatz AL-1 catheter to engage the saphenous vein  grafts to the obtuse marginal and diagonal as well as the native right  coronary artery. An internal mammary artery catheter was used to study the  left internal mammary artery graft. An angled pigtail catheter was used for  left heart catheterization  and ventriculography. All exchanges were made  over wire and the patient  tolerated the procedure well without immediate  complications.   HEMODYNAMIC RESULTS:  Left ventricle 155/20 mmHg. Aorta 156/63 mmHg.   ANGIOGRAPHIC FINDINGS:  1.  The left main coronary artery is free of significant flow-limiting      coronary atherosclerosis.  2.  Left anterior descending is a medium caliber vessel. There is a proximal      bifurcating diagonal branch. In the proximal segment of the left      anterior descending, there is a focal 90% stenosis followed by a      proximal 80% stenosis at the midvessel segment with distal competitive      filling noted.  3.  The circumflex coronary artery is also a medium caliber vessel with two      obtuse marginal branches. The first obtuse marginal branch has 85%      ostial stenosis and the second obtuse marginal branch has a 99% diffuse      proximal stenosis. Competitive filling is seen in the second obtuse      marginal branch.  4.  The right coronary artery is diffusely diseased with 40% stenosis in the      proximal segment, 70%  stenosis in the mid segment and an occlusion at      the midvessel segment. The distal vessel and posterior descending branch      fill via a saphenous vein graft.  5.  The saphenous vein graft to the posterior ascending branch of the right      coronary artery is patent with minor irregularities including 20%      stenosis in the mid body of the graft.  6.  The saphenous vein graft to the second obtuse marginal branch is patent.      There is 20% stenosis in the proximal portion of the body of the graft      and a prominent valve in the distal portion of the graft with      approximately 40% stenosis that is most likely anatomical. This does not      look significantly different compared to prior films from 2005.  7.  The saphenous vein graft to the diagonal branch is smaller in caliber      and has a 30% proximal stenosis as well as 50% fairly smooth mid body      graft stenosis. This does not look  to be flow-limiting or significantly      changed compared to previous films.  8.  The left internal mammary artery graft is somewhat atretic but patent      without focal disease. This feeds a distal left anterior descending that      is fairly small and likely somewhat diffuse diseased as well.   Left ventriculography was performed in the RAO projection. It reveals an  ejection fraction approximately of 75% in the setting of ventricular ectopy.  There is no significant mitral regurgitation noted.   DIAGNOSES:  1.  Severe native coronary artery disease as outlined above.  2.  Patent left internal mammary artery graft to the left anterior      descending, saphenous vein graft to the diagonal, saphenous vein graft      to the obtuse marginal, and saphenous vein graft to the posterior      descending. There is disease involving these grafts, although not      obstructive and without major change compared to previous films from      2005.  3.  Left ventricular ejection fraction approximately 75% in the setting of      ventricular ectopy. Left ventricular end-diastolic pressure is 20 mmHg      and there is no significant mitral regurgitation noted.   DISCUSSION:  I reviewed the findings with the patient and her family. At  this point, would anticipate continued medical therapy for coronary disease  and continued search for other potential etiologies of the patient's chest  and abdominal discomfort.        SGM/MEDQ  D:  09/29/2004  T:  09/29/2004  Job:  [id number]   cc:   Willa Rough, M.D.

## 2010-07-29 NOTE — Consult Note (Signed)
NAMEDANEY, MOOR              ACCOUNT NO.:  [id number]   MEDICAL RECORD NO.:  [id number]          PATIENT TYPE:  INP   LOCATION:  2009                         FACILITY:  [facility]   PHYSICIAN:  Hollice Espy, M.D.DATE OF BIRTH:  Aug 01, 1928   DATE OF CONSULTATION:  04/19/2006  DATE OF DISCHARGE:                                 CONSULTATION   CONSULT   ATTENDING PHYSICIAN:  Dr. Mallie Snooks, [facility]   PRIMARY CARE PHYSICIAN:  Dr. Duane Lope   REASON FOR CONSULTATION:  Dizziness.   HISTORY OF PRESENT ILLNESS:  The patient is a 77 year old white female  with a past medical history of hypertension, diabetes mellitus and CAD  who came in complaining of episodes of chest pain.  Her previous medical  history has been that she had a CABG back in well over 5 years ago and  has been followed for her blood pressure which she says has been  moderately well controlled and her diabetes which has been very well  controlled.  She had some episodes in the last few months of elevated  blood sugars and on evaluation her PCP found that she had sinusitis.  She was treated for this with antibiotics which she says has greatly  improved her symptoms.  She also has noticed that intermittently over  the last two months approximately 1-2 times every week, she has been  having problems with these what she refers to as weakness spells.  When  asked to better clarify she describes them as episodes of where she when  standing, not when sitting and not when lying down, but when standing  and trying to walk she will suddenly feel very lightheaded to the point  where she has to sit down before she feels like she is going to pass  out.  She notes no nausea or vomiting with this.  She says she  occasionally has some shortness of breath but she is not really sure if  this is associated with it as at times, other times when walking around  she will notice that she is a bit more short of breath than  usual but  not necessarily to correlate with this lightheadedness.  Symptoms  usually last for a few minutes, improve when patient sits down or lies  down and goes away.  Again, only occurring about 1-2 times every week  for the past few months.  Approximately a few days ago the patient  started having some severe episodes of chest pressure and pain along  with symptoms again of feeling very lightheaded when standing and weak.  However, unlike previous times while her symptoms would improve with  sitting back down, they would immediately return.  With her episodes of  chest pressure and pain she became concerned and came into the emergency  room.  She was evaluated by Dr. Gala Romney by [facility] who took  the patient for cardiac catheterization today.  Her cardiac  catheterization was unremarkable, however with her other symptoms of  lightheadedness when standing continued to persist.  When better asked  to describe her lightheadedness symptoms,  she tells me that at home she  had noted that when these symptoms would occur she had vertigo-like  symptoms of room spinning.  Here, however, she has noted no such vertigo  symptoms but again any attempts to stand up out of bed she feels  extremely lightheaded and she is extremely unsteady when ambulating,  leading her to come back to bed.  With these findings, Cardiology asked  the Hospitalist for an evaluation to better quantify.  The patient's  heart rate during this hospitalization has ranged anywhere from the mid  60s to the high 70s.  Her blood pressure has remained relatively steady,  ranging anywhere from as high as a systolic of 150s to as low as a  systolic of the 1-teens.  Her oxygen saturations have been within normal  limits.  Lab work done on the patient - she has had normal liver  functions, normal cardiac enzymes, a CBC with diff as noted, normal  white count of 8.6 with a slightly low hemoglobin and hematocrit of 11.1   and 32.7, but with a normal MCV of 93 and platelet count of 209 with a  slightly elevated neutrophil count of 83%.  A TSH has been normal.  Hemoglobin A1c has been slightly elevated at 7.6.  A D-dimer has been  within normal limits as well.  A chest x-ray on admission showed a mild  cardiomegaly.  Currently patient is doing okay when sitting down.  When  she stands up or walks with me, she complains of an unsteady gait and  some lightheadedness.  No vertigo symptoms.  She denies any nausea or  vomiting.  She complains of a mild headache, generalized, but no vision  changes, no blurry vision, no dysphasia, no chest pain, no palpitations,  no shortness of breath, no wheezing, no coughing.  No abdominal pain, no  hematuria, dysuria, constipation, diarrhea.  No focal extremity  numbness, weakness or pain.  She says to me she has occasional episodes  at night chronically of tingling in her lower extremities, but currently  she is not having these.  Review of systems is otherwise negative.   The patient's past medical history includes diabetes, hypertension, CAD  status post a CABG a few years ago, and hyperlipidemia.   Medications based on patient's admission H&P are:  Aspirin 81 p.o. daily.  Prinivil 10 p.o. b.i.d.  Lasix 20 p.o. daily.  Plavix 75 p.o. daily.  Toprol XL 50 mg p.o. daily.  Glyburide 5 p.o. t.i.d.  Imdur 30 p.o. daily.  Multivitamin.  Metformin 500 p.o. b.i.d.  Lipitor 20 p.o. daily.  Augmentin 835 p.o. b.i.d.  Ativan 1 mg p.o. q.h.s. p.r.n.   In the interim the patient has not had any medications changes other  than a dose of antibiotics recently, but no other medications have been  adjusted other than her Lipitor, which initially had been decreased down  to 10 mg but recently increased back up to her previous dose of 20 mg in  the past few months.  No other medication changes have occurred.   ALLERGIES:  Patient has no known drug allergies.  SOCIAL HISTORY:  No  alcohol or drug use.  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   FAMILY HISTORY:  Noncontributory.   PHYSICAL EXAMINATION:  The patient's vitals:  I checked orthostatic  vitals on this patient.  Her blood pressure lying down is 144/75.  The  heart rate is 73.  After standing her up and waiting 2 minutes, her  blood pressure actually increased to 176/89 with a pulse of 74.  In  general, patient is in no apparent distress.  HEENT is normocephalic,  atraumatic.  Her mucous membranes are slightly dry.  She has no carotid  bruits.  Cranial nerve II through XII are intact.  Heart is a regular  rate and rhythm with a 1/6 systolic ejection murmur.  Lungs clear to  auscultation bilaterally.  Abdomen is soft, nontender, nondistended,  positive bowel sounds.  Extremities show no clubbing, cyanosis or edema.  She has cooler low extremities with poor peripheral pulses and signs of  chronic sensory neuropathy as well as bunions bilaterally.  She has no  other focal neurological findings.  Her sensation is still intact.  She  has no evidence of Babinski sign.  Negative pronator drift.  She has  some mild generalized weakness of a 5/5+ upper and lower, but no  evidence of any focal deficits.  In ambulating her, the patient is able  to ambulate; however, she is noted to be quite moderately unsteady on  her feet.  She shows no signs of proprioception, but again she says she  is more unsteady because of lightheadedness, not necessarily because of  any extremity dysfunction.   Lab work on this patient, D-dimer is normal, A1c 7.6, TSH 1.495.  CBC:  White count of 8.6 but with an 82% shift.  H&H 11.1 and 32.7.  MCV of  93.  Liver function tests are unremarkable as well including a normal  albumin.   ASSESSMENT AND PLAN:  Dizziness and lightheadedness.  This patient  is  not orthostatic and she is not suffering from any form of general  weakness.  This appears to be a relatively acute process with an  increase in the frequency of symptoms in the last few days.  Certainly  while not the first thing on my differential diagnosis, I would be  concerned about a possible stroke and will check an MRI/MRA to rule out  a possible cerebellar infarct.  The other issue is while her medications  have not been changed in the last year she has, however, lost what she  is stating anywhere from 10-20 pounds in the past year and it is a  possibility that she may be at times getting lightheadedness from  hypotension due to decreased need for blood pressure medications.  Although to go against this would be a possible fact that even on  checking her vitals here, her blood pressure has never been markedly  low.  She also shows no signs of any bradycardia.  Other concerns may be  a possibility of some form of hypoxia noting that she does complain of some mild occasional shortness of breath even though her O2 sats in  general are well, we favor checking arterial blood gas just to rule out  any form of further hypoxia given her history of exposure to second-hand  smoke.  I have discussed this extensively with the patient and her  daughter and they understand that we may not be able to discern an  etiology for this, but at the very least the patient certainly is  unsteady on her feet and it appears again to be an acute process.  We  will also get a PT/OT evaluation to help  treat the patient while we are  trying to find the underlying cause.  In regards to the other patient's  medical issues, her diabetes, recurrent sinus infections, hypertension  and CAD, these are all stable medical issues at this time.      Hollice Espy, M.D.  Electronically Signed     SKK/MEDQ  D:  04/19/2006  T:  04/19/2006  Job:  [id number]   cc:   C. Duane Lope, M.D.  Bevelyn Buckles. Bensimhon,  MD

## 2010-07-29 NOTE — Cardiovascular Report (Signed)
[name], [name]              ACCOUNT NO.:  [id number]   MEDICAL RECORD NO.:  [id number]          PATIENT TYPE:  INP   LOCATION:  2009                         FACILITY:  [facility]   PHYSICIAN:  Bevelyn Buckles. Bensimhon, MDDATE OF BIRTH:  1929/03/09   DATE OF PROCEDURE:  04/19/2006  DATE OF DISCHARGE:                            CARDIAC CATHETERIZATION   PRIMARY CARE PHYSICIAN:  C. Duane Lope, M.D.   CARDIOLOGIST:  Dr. Jerral Bonito   IDENTIFICATION:  [name] is a 77 year old woman with history of  coronary artery disease status post bypass surgery in 2000.  She was  admitted with chest pain and fatigue.  There were minimal EKG changes.  She ruled out for myocardial infarction with serial cardiac enzymes.  She is brought to the cath lab for diagnostic angiography.   PROCEDURES PERFORMED:  1. Selective coronary angiography.  2. Saphenous vein graft angiography x3.  3. LIMA angiography.  4. Left heart catheterization.  5. Left ventriculogram.  6. AngioSeal femoral closure device.   DESCRIPTION OF PROCEDURE:  The risks and benefits of catheterization  were explained.  Consent was signed and placed on the chart.  A 6-French  arterial sheath was placed in the right femoral artery using modified  Seldinger technique.  A JL-4 catheter was used to image the left  coronary system.  A JR-4 catheter was used to image the right coronary  artery, the saphenous vein graft to the right coronary artery, the LIMA  to the LAD. A left coronary bypass graft was used to image the saphenous  vein graft to the OM and the saphenous vein graft to the diagonal.  An  angled pigtail catheter was used for left ventriculogram.  All catheter  exchanges made over wire.  There were no apparent complications.   HEMODYNAMIC RESULTS:  Central aortic pressure was 131/64 with mean of  92.  LV pressure is 158/8 with an EDP of 17.  There is no aortic  stenosis.   CORONARY ANGIOGRAPHY.:  Left main was normal.   LAD was  a moderate-sized vessel.  It was totally occluded in the  proximal to mid section after giving off two diagonals.  There is a 50%  stenosis prior to the occlusion.   Left circumflex was a moderate-sized system.  It gave off moderate-sized  OM-1, a small OM-2, and no OM-3.  There was a 40% ostial left circumflex  lesion and 30% lesion in the mid AV groove.  In the ostium of the OM-1  there was a 70-80% lesion.  The OM-2 was totally occluded proximally.   Right coronary artery was moderate-sized dominant vessel.  It gave off  an RV branch.  It had diffuse 70% disease throughout.  It was totally  occluded in the distal section prior to the takeoff of the PDA.   The LIMA to the LAD was widely patent.   The saphenous vein graft to the diagonal was widely patent.   Saphenous vein graft to the OM was widely patent.   Saphenous vein graft to the PDA was widely patent and back-filled to  show the distal RCA  and a moderate-sized posterolateral.   Left ventriculogram done in the RAO position:  Showed a vigorous LV  function with EF of 70%.  There is no regional wall motion  abnormalities.   ASSESSMENT:  1. Severe three-vessel native coronary artery disease as described      above.  2. All bypass grafts patent.  3. Normal LV function.   PLAN/DISCUSSION:  I suspect her chest pain and fatigue are probably  noncardiac. She has intact revascularization.  I will check a D-dimer,  if that is negative hopefully she can be discharged home today if her  groin remains stable.      Bevelyn Buckles. Bensimhon, MD  Electronically Signed     DRB/MEDQ  D:  04/19/2006  T:  04/19/2006  Job:  [id number]   cc:   C. Duane Lope, M.D.  Luis Abed, MD, [facility]

## 2010-07-29 NOTE — H&P (Signed)
[name], [name]              ACCOUNT NO.:  [id number]   MEDICAL RECORD NO.:  [id number]          PATIENT TYPE:  INP   LOCATION:  1826                         FACILITY:  [facility]   PHYSICIAN:  Rollene Rotunda, MD, FACCDATE OF BIRTH:  1928/05/20   DATE OF ADMISSION:  04/18/2006  DATE OF DISCHARGE:                              HISTORY & PHYSICAL   CARDIOLOGIST:  Dr. Willa Rough.   PRIMARY CARE PHYSICIAN:  Dr. Judithann Sauger.   CHIEF COMPLAINT:  Chest pain.   DATE OF ADMISSION:  February 6, 20008.   CHIEF COMPLAINT:  Chest pain.   HISTORY OF PRESENT ILLNESS:  [name] is a very pleasant, 77 year old  female patient followed by Dr. Myrtis Ser with a history of coronary disease  status post CABG in 2000 with patent grafts at catheterization July  2006, good LV function, diabetes mellitus, hyperlipidemia, and  hypertension who presents to the office today with complaints of chest  pain.  Over the last week or so, she has not felt well.  She has noted a  lot of left jaw pain.  On Sunday of this week, she developed substernal  chest discomfort with radiation to her left axilla and jaw with  associated shortness of breath and nausea and lightheadedness.  She took  nitroglycerin times 2 with relief.  She continued to feel poorly over  the last couple of days and she had a recurrence of pain this morning  that awoke her from sleep about 6 a.m.Marland Kitchen  She took a couple of  nitroglycerin.  She is still having pain that she rates at about 3/10.  She called the office this morning and was added onto my schedule.  Her  electrocardiogram does reveal lateral T-wave changes that are different  from her previous tracings.   PAST MEDICAL HISTORY:  Significant for:  1. Coronary disease status post CABG in 2000.  She had a cardiac      catheterization July 1006 which revealed patent grafts.  Her grafts      include a LIMA to LAD, vein graft to the diagonal, vein graft to      the obtuse marginal, and vein  graft to the posterior descending      artery.  2. Good LV function.  3. Diabetes mellitus.  4. Hypertension.  5. Hyperlipidemia.  6. History of pancreatitis.  7. Status post cholecystectomy.  8. Status post hysterectomy  9. History of admission to the hospital in April 2005 with elevated      cardiac enzymes and EKG changes with wall motion abnormalities.      This improved but the etiology was never clear.   CURRENT MEDICATIONS:  1. Aspirin 81 mg daily.  2. Prinivil 10 mg twice daily.  3. Lasix 20 mg daily.  4. Plavix 75 mg daily.  5. Toprol XL 50 mg daily.  6. Glyburide 5 mg 3 times a day with meals.  7. Imdur 30 mg daily.  8. Vitamin B12.  9. Metformin 500 mg twice daily.  10.Lipitor 20 mg nightly.  11.Generic Augmentin 875 mg twice daily.  12.Lorazepam 1 mg  nightly p.r.n.  13.Methscopolamine 2.5 mg q.6 hours p.r.n.   ALLERGIES:  1. CORTISONE CAUSES GI UPSET.  2. CODEINE CAUSES NAUSEA.  3. METRONIDAZOLE IS ALSO REPORTED AS AN ALLERGY   SOCIAL HISTORY:  She denies any tobacco or alcohol abuse.  She has seven  children.  She is not married.  She takes care of 2 grandchildren.   FAMILY HISTORY:  Significant for coronary artery disease.   REVIEW OF SYSTEMS:  Please see HPI.  Denies any fevers, chills, cough,  hemoptysis, melena, hematochezia, hematuria, dysuria.  She does have a  recent sinusitis that seems to be resolving on antibiotics.  She sleeps  on 3 pillows.  She has done this since her bypass surgery.  She  occasionally has shortness of breath that wakes her from sleep.  This  has been chronic since her bypass surgery.  There has been no change.  She noted some palpitations with her chest pain.  She also notes some  worsening chest pain with inspiration with her chest pain.  She has  noted a lot of left jaw pain over the last several weeks.  The rest of  the review of systems are negative.   PHYSICAL EXAM:  GENERAL:  She is a well-nourished, well-developed  female  in no acute distress.  VITAL SIGNS:  Blood pressure is 158/78. Pulse 81. Weight 158 pounds.  HEAD:  Normocephalic, atraumatic.  EYES: PERRLA.  EOMI.  Sclera are clear.  NECK:  Without JVD and without lymphadenopathy.  Carotids without bruits  bilaterally.  ENDOCRINE:  Without thyromegaly.  CARDIAC:  Normal S1, S2.  Regular rate and rhythm without murmurs,  clicks, rubs, or gallops.  LUNGS:  Clear to auscultation bilaterally without wheezing, rhonchi, or  rales.  ABDOMEN:  Soft and nontender with normoactive bowel sounds.  No  organomegaly.  EXTREMITIES:  Without edema.  Calves are soft, nontender.  SKIN:  Warm and dry.  NEUROLOGIC:  She is alert and x3.  Cranial nerves II XII are grossly  intact.  VASCULAR:  2+ femoral artery pulses bilaterally without bruit.  Dorsalis  pedis and posterior tibialis pulses are 2+ bilaterally.   Electrocardiogram reveals a sinus rhythm with a  heart rate of 81,  normal axis, T-wave inversions in #1 and AVL and V1 through 6.  Occasional PAC.  The T-wave changes in V4-V6 are new since the previous  tracings.   IMPRESSION:  1. Unstable angina pectoris.  2. Coronary artery disease status post CABG in 2000 with patent grafts      in July 2006.  3. Good left ventricular function.  4. Diabetes mellitus.  5. Hypertension.  6. Hyperlipidemia.  7. History of pancreatitis.   PLAN:  The patient is also going to be examined by Dr. Antoine Poche.  Her  symptoms are concerning for unstable angina pectoris.  She also has new  lateral T-wave changes.  She will be admitted to the hospital today.  She will be transported via EMS.  She will go to a step-down bed.  She  will be treated with aspirin, heparin, beta blocker, nitroglycerin drip,  and we will continue her Plavix.  We will check serial enzymes.  We have  recommended cardiac catheterization.  She agrees to proceed.  We will try to do that today, otherwise it will be done tomorrow.  We will  continue  to check serial enzymes.      Tereso Newcomer, PA-C      Rollene Rotunda, MD, [facility]  Electronically  Signed    SW/MEDQ  D:  04/18/2006  T:  04/18/2006  Job:  [id number]   cc:   C. Duane Lope, M.D.  Iva Boop, MD,FACG

## 2010-07-29 NOTE — Discharge Summary (Signed)
[name], [name]              ACCOUNT NO.:  [id number]   MEDICAL RECORD NO.:  [id number]          PATIENT TYPE:  INP   LOCATION:  6738                         FACILITY:  [facility]   PHYSICIAN:  Stuttgart Bing, M.D.  DATE OF BIRTH:  1928-07-17   DATE OF ADMISSION:  09/29/2004  DATE OF DISCHARGE:  10/02/2004                                 DISCHARGE SUMMARY   PRINCIPAL DIAGNOSIS:  Chest and mid abdominal pain.   OTHER DIAGNOSES:  1.  Coronary artery disease, status post CABG in 2005.  2.  Type 2 diabetes mellitus.  3.  Hypertension.  4.  Hyperlipidemia.  5.  History of pancreatitis, status post cholecystectomy.  6.  Status post hysterectomy.  7.  Likely viral gastroenteritis.   ALLERGIES:  CODEINE causes nausea.   PROCEDURES:  1.  Left heart cardiac catheterization.  2.  CT of the abdomen.   HISTORY OF PRESENT ILLNESS:  This is a 77 year old white female with prior  history of CAD, status post MI, 2000, subsequent CABG, also in 2000.  She  was last admitted to [facility] in April 2005, at which time she had  elevated troponin and EF in the 30s, with catheterization revealing no  culprit lesion.  She subsequently recovered her LV function.  She was in her  usual state of health until July 20 when she experienced sudden onset of  nausea followed by chest and arm pain similar to previous angina.  She also  complained of a diffuse abdominal and epigastric discomfort.  EKG shows  sinus tachycardia without any acute ST or T-wave changes.  Cardiac enzymes  were negative.  She was subsequently admitted for further evaluation.   HOSPITAL COURSE:  The patient underwent left heart cardiac catheterization  on July 20, revealing patent LIMA to LAD,  patent vein graft to diagonal,  patent vein graft to OM2, and patent vein graft to the distal right coronary  with native multivessel disease.  Despite this she continued to complain of  diffuse abdominal discomfort relieved with morphine or  Oxycodone.  She  underwent a CT of her abdomen to rule out a retroperitoneal bleed, and this  was negative.  There were no other acute findings on the CT.  She was  transferred onto the floor on July 21 and was seen by GI on July 22.  It was  their feeling that she was likely suffering from a viral gastroenteritis and  recommended a low residue diet with p.r.n. Levsin for abdominal cramping.  She was also mildly febrile on July 22 with a temperature of 99.  Urinalysis  and blood cultures were sent off and were negative.  She has had improvement  in symptoms with GI recommendations and is being discharged home today in  satisfactory condition.   LABORATORY DATA:  Discharge labs:  Hemoglobin 10, hematocrit 29.1, WBC 4.3,  platelets 193.  Sodium 136, potassium 3.4, TSH 23, BUN 16, creatinine 1.2,  glucose 114, calcium 7.8.  Cardiac enzymes were negative x1.  Total  cholesterol 118, triglycerides 165.  HDL 39, LDL 46, TSH 1.149.  Iron 17,  TIBC 266.  Urine culture, stool culture, blood culture and stool for C.  difficile were all negative.   DISPOSITION:  The patient is being discharged home today in good condition.   FOLLOW UP:  She is asked to follow up with primary care physician, Dr.  Marny Lowenstein, in 1-2 weeks.  She will have follow-up with Dr. Willa Rough in  cardiology clinic in two weeks.  She will also follow up with Dr. Leone Payor in  our GI for colonoscopy in the future.  This will preferably be done off of  Plavix.   LABORATORY DATA:  Other lab studies:  None.   DURATION OF DISCHARGE ENCOUNTER:  40 minutes.       CRB/MEDQ  D:  10/02/2004  T:  10/02/2004  Job:  [id number]   cc:   Willa Rough, M.D.   Sharlet Salina, M.D.  [address]  [place]  [place] [address]  Fax: [phone]   Iva Boop, M.D. [facility]  [address] [place], [place] [address]

## 2010-07-29 NOTE — Assessment & Plan Note (Signed)
 HEALTHCARE                            CARDIOLOGY OFFICE NOTE   DANIEL, JOHNDROW                       MRN:          [id number]  DATE:07/06/2006                            DOB:          08-27-1928    ADDENDUM   I have carefully reviewed the entire chart on [name] from her  admission to [facility] in February of 2008.  As outlined above the  patient underwent cardiac catheterization.  Now the patient had some  weakness, and the patient and the family have requested further  evaluation.  The patient was seen in consultation by Dr. Evelena Asa of the  [facility] on April 20, 2006.  The patient was transferred to  Dr. Margarette Canada service that day.   Dr. Evelena Asa ordered occupational therapy evaluation.  The brief OT note  says that there is no significant occupational therapy abnormality.  The  patient was seen by physical therapy.  There were no obvious physical  therapy abnormalities except the patient had some lightheadedness with  rapid head motion.  The point was made to consider outpatient vestibular  evaluation.  The MRI/MRA was also ordered by Dr. Evelena Asa.  It is  discussed in the first paragraph of this note.   After these tests the patient was discharged by Dr. Evelena Asa.     Luis Abed, MD, [facility]  Electronically Signed    JDK/MedQ  DD: 07/11/2006  DT: 07/11/2006  Job #: [id number]   cc:   C. Duane Lope, M.D.

## 2010-07-29 NOTE — H&P (Signed)
NAMEKIAYA, HALIBURTON                        ACCOUNT NO.:  [id number]   MEDICAL RECORD NO.:  [id number]                   PATIENT TYPE:  INP   LOCATION:  NA                                   FACILITY:  [facility]   PHYSICIAN:  Salvadore Farber, M.D. [facility]         DATE OF BIRTH:  1929-02-19   DATE OF ADMISSION:  DATE OF DISCHARGE:                                HISTORY & PHYSICAL   PRIMARY CARE PHYSICIAN:  Sharlet Salina, M.D.   CARDIOLOGIST:  Willa Rough, M.D.   HISTORY OF PRESENT ILLNESS:  [name] is a 77 year old lady, status post  coronary artery bypass grafting in 2000. She had generally done well after  the surgery without recurrent angina and no symptoms of heart failure.  Records from that hospitalization are not currently available.   She now presents having developed chest discomfort at approximately 8:30  this evening while watering her lawn. The discomfort lasted for a total of  approximately three hours and was accompanied by shortness of breath,  nausea, and diaphoresis. The pain began in her chest, but subsequently  radiated to her neck, jaw, and both shoulder blades. The pain is similar to  that which accompanied her prior myocardial infarction. It is not pleuritic  in nature. There was no response to sublingual nitroglycerin. The pain  resolved spontaneously after approximately three hours. She arrived in the  [facility] emergency room pain free. She remains pain free now.   PAST MEDICAL HISTORY:  1. Coronary artery disease, status post myocardial infarction and coronary     artery bypass grafting 2000.  2. Diabetes mellitus times 15 years.  3. Dyslipidemia.  4. Status post hysterectomy, status post cholecystectomy, and pancreatitis     in the 1980s.   ALLERGIES:  CODEINE causes nausea.   MEDICATIONS:  Aspirin 81 mg per day, Prinivil 10 mg b.i.d., Lipitor 10 mg  daily, Glyburide 5 mg t.i.d., Glucophage 500 mg b.i.d., and Diazepam p.r.n.   SOCIAL HISTORY:   The patient is divorced and lives alone.  She has a  daughter who lives in [place] who accompanies her this evening. She  smoked minimally for approximately three years 30 years ago.  Denies alcohol  use.   FAMILY HISTORY:  Father developed coronary artery disease in his 75s and  died at age 38. Mother died of pancreatic cancer at 11. She had four  siblings. A brother and sister died of cancer, details unknown. One sister  died of unknown causes. A brother is alive with asbestosis.   REVIEW OF SYSTEMS:  Negative in detail except as above.   PHYSICAL EXAMINATION:  GENERAL: This is a thin, well-appearing woman in no  distress.  VITAL SIGNS: Heart rate 104, blood pressure 112/71, and oxygen saturation of  98% on two liters.  NECK: No jugular venous distention.  LUNGS: Clear to auscultation and percussion bilaterally. There is no  thyromegaly.  CARDIAC: She has a nondisplaced  point of maximal cardiac impulse. She is  tachycardic with a regular rhythm. There is no murmur, rub, or S3.  ABDOMEN: Soft, nondistended, and nontender. There is no hepatosplenomegaly.  Bowel sounds are normal.  EXTREMITIES: Normal without clubbing, cyanosis, edema, or ulcerations.  Carotid pulses are 2+ bilaterally without bruits. Femoral pulses are 2+  bilaterally without bruits. DP pulses are 1+ bilaterally.   Electrocardiogram demonstrates sinus tachycardia with borderline low voltage  and minor nonspecific ST-T abnormalities.   LABORATORY STUDIES:  Remarkable for troponin-I of 0.67, CK-MB 5.9, albumin  4.4, creatinine 0.9, glucose 262, sodium 136, potassium 3.6, INR 0.8, PTT  29, WBC 7.8, hematocrit 40, and platelet count 250,000.   IMPRESSION/PLAN:  1. Non-ST elevation myocardial infarction: The patient will be transferred     from [facility] to [facility] where she will be admitted. She     will be treated with continued aspirin and ACE inhibitor. Will add beta     blocker, Plavix, and  eptifibatide. Will plan cardiac catheterization in     the morning.  2. Diabetes mellitus: Glyburide and metformin will be held in anticipation     of patient being NPO for cardiac catheterization. Metformin will be held     for 24 hours after catheterization. She will be treated with sliding-     scale insulin.  3. Dyslipidemia: Will check fasting lipid profile and continue Lipitor.                                               Salvadore Farber, M.D. [facility]    WED/MEDQ  D:  07/09/2003  T:  07/09/2003  Job:  [id number]   cc:   Willa Rough, M.D.   Sharlet Salina, M.D.  [address]  [place]  [place] [address]  Fax: [phone]

## 2010-07-29 NOTE — H&P (Signed)
[name], [name]                        ACCOUNT NO.:  [id number]   MEDICAL RECORD NO.:  [id number]                   PATIENT TYPE:  INP   LOCATION:  2017                                 FACILITY:  [facility]   PHYSICIAN:  Willa Rough, M.D.                  DATE OF BIRTH:  September 26, 1928   DATE OF ADMISSION:  07/09/2003  DATE OF DISCHARGE:  07/11/2003                                HISTORY & PHYSICAL   ADMISSION DIAGNOSES:  1. Abnormal cardiac enzymes/ acute coronary syndrome, rule out myocardial     infarction.  2. Known coronary artery disease, status post myocardial infarction, and     coronary artery bypass grafting in 2000.  3. Diabetes mellitus.  4. Dyslipidemia.   ALLERGIES:  CODEINE causing nausea.   DISCHARGE DIAGNOSES:  1. Acute coronary syndrome, MI ruled out with negative enzymes.  2. Congestive heart failure with significant reduction in LV function with     EF of 30%, and 2+ MR.     A. History of normal LV function in the past.     B. Etiology of significant LV dysfunction not entirely clear at present.  3. Abnormal cardiac enzymes/ acute coronary syndrome, rule out myocardial     infarction.  4. Known coronary artery disease, status post myocardial infarction, and     coronary artery bypass grafting in 2000.  5. Diabetes mellitus.  6. Dyslipidemia.   HISTORY OF PRESENT ILLNESS:  [name] is a 77 year old woman who is status  post coronary artery bypass graft in 2000.  She has done reasonably well  without recurrent angina or symptoms of heart failure.   She presents to [facility] emergency room on July 09, 2003, at  approximately 8:30 in the evening after watering her lawn.  She developed a  discomfort that lasted approximately three hours and accompanied by  shortness of breath, nausea and diaphoresis.  She also had chest pain which  radiated to her neck, jaw and both shoulder blades.  This is similar to the  discomfort she had prior to her  myocardial infarction, and is not pleuritic  in nature.  No response to nitroglycerin.  The discomfort spontaneously  resolved after the three hour period.  She went to the extremities at [facility] and was pain free.   LABORATORY DATA:  Labs in the emergency room showed a troponin I of 0.67  with a CK-MB of 5.9, and EKG showed sinus tachycardia with minor nonspecific  ST-T wave abnormalities.  The patient will be admitted for acute coronary  syndrome, rule out myocardial infarction.  She will be transferred to [facility] for further evaluation and treatment.  We will plan cardiac  catheterization for the morning.   PROCEDURES:  Cardiac catheterization July 09, 2003, by Dr. Arturo Morton.  [place].  Complications none.   CONSULTATIONS:  Cardiac rehabilitation.   HOSPITAL COURSE:  Ms.  Veach was admitted to [facility] from [place] emergency room in stable condition for acute coronary syndrome, rule  out myocardial infarction.  She was placed on Plavix, Integrilin, aspirin,  and IV heparin.  She remained chest pain free.   Cardiac enzymes series as follows:  CK 113, 105.  MB 7.3, 6.2.  Troponin I  0.89, 0.65.   The patient was taken to the cardiac catheterization laboratory by Dr.  Riley Kill on July 09, 2003, which revealed high-grade native coronary artery  disease.  She had a patent LIMA to the LAD.  She had a patent SVG to the  CIRC, and a patent SVG to the distal RCA.  She had an SVG to what appears to  be an OM branch with a 60% proximal lesion in the vein graft.  EF 30% with  2+ MR.  Dr. Riley Kill did comment on that wall motion abnormality.  Appeared  to be in excess of single vascular territory, and questioned Tako-Tsubo  abnormality.  The films were reviewed with his colleagues.   On July 10, 2003, the patient remained hemodynamically stable and chest  pain free.  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  There was no evidence of significant volume overload.  It was felt this needed to be followed closely as an outpatient.   On July 11, 2003, the patient continued to remain hemodynamically stable.  She did have a low-grade temperature of 99 to 100.1, and a chest CT was  ordered per PE protocol, and was negative.  She also had a D-Dimer which was  negative.   Dr. Andee Lineman spent greater than 20 minutes talking with the patient and her  family about her current condition.  He recommended the addition of beta-  blocker and diuretic therapy to her medicine regimen.  He also recommended  Plavix for three to six months.  He would like her to have close followup  with Dr. Myrtis Ser in the office this week.   LABORATORY DATA:  At discharge, WBC 7.9, hemoglobin 11.4, platelets 173,000.  Sodium 139, potassium 3.9, BUN 11, creatinine 1.0.   Total cholesterol 128, triglycerides  289,000, HDL 36, and LDL 34.   DISCHARGE MEDICATIONS:  1. Enteric-coated aspirin 325 mg a day.  2. Plavix 75 mg a day (duration to be determined by Dr. Myrtis Ser).  3. Prinivil 10 mg b.i.d.  4. Metoprolol 25 mg every eight hours.  5. Lipitor 10 mg a day.  6. Glyburide 5 mg three times a day.  7. Glucophage 500 mg twice a day.  8. Diazepam as needed, as directed.  9. Lasix 20 mg a day.  10.      Nitroglycerin 0.4 mg one under the tongue every five minutes as     needed for chest pain.  11.      She will be given prescriptions for Lasix 20 mg, one p.o. daily     with 11 refills, nitroglycerin 0.4 mg one sublingual q.5 minutes p.r.n.     chest pain #25, five refills, Plavix 75 mg one p.o. daily #30 with five     refills, metoprolol 25 mg one p.o. q.8h. #90 with eleven refills.   DISCHARGE ACTIVITY:  As tolerated.   DISCHARGE DIET:  Diabetic, heart healthy.   DISCHARGE INSTRUCTIONS:  1. May shower. 2. She is asked to call the office for  problems or  questions.  3. Dr. Henrietta Hoover office will call her this week to set up an appointment.      Georgiann Cocker Jernejcic, P.A.                   Willa Rough, M.D.    TCJ/MEDQ  D:  07/11/2003  T:  07/11/2003  Job:  [id number]   cc:   Willa Rough, M.D.

## 2010-07-29 NOTE — Assessment & Plan Note (Signed)
[facility] HEALTHCARE                              CARDIOLOGY OFFICE NOTE   [name], [name]                       MRN:          [id number]  DATE:11/07/2005                            DOB:          [birth year]    [name] is doing well.  She has seen Sharlet Salina, M.D. in the past  and is going to be seeing Dr. Vianne Bulls tomorrow as Dr. Marny Lowenstein is  retiring.  The patient is doing well from the cardiac view point.  She  continues to see Dr. Leone Payor of our GI team for continued abdominal pain.  Her cardiac status, however, is stable.  The patient last underwent a  cardiac catheterization in 2006.  She had patent grafts.  There was good LV  function, medical therapy was recommended, and she is doing well.   PAST MEDICAL HISTORY:   ALLERGIES:  CODEINE.   MEDICATIONS:  1. Aspirin 81.  2. Lipitor 10.  3. Prinivil 10 b.i.d.  4. Lasix p.r.n.  5. Plavix 75.  6. Toprol XL 50.  7. Glyburide 5 t.i.d.  8. Imdur 30.  9. Vitamins.  10.Metformin 500 b.i.d. (the patient is holding it at this time on her      own).   OTHER MEDICAL PROBLEMS:  See the last below.   REVIEW OF SYSTEMS:  Other than her abdominal discomfort, the patient feels  fine and her review of systems otherwise is negative.   PHYSICAL EXAMINATION:  GENERAL:  The patient is oriented to person, time,  and place and her affect is normal.  LUNGS:  Clear.  Respiratory effort is not labored.  HEENT:  Reveals no xanthelasma.  She has normal extraocular motion.  No  carotid bruits and no jugular venous distention.  CARDIAC:  Reveals an S1 with an S2.  There are no clicks or significant  murmurs.  ABDOMEN:  Soft.  There are no masses or bruits.  There is no peripheral  edema.  There are no musculoskeletal deformities.   EKG reveals a sinus rhythm with nonspecific ST-T wave changes.  The QT  interval is prolonged.   PROBLEM LIST:  1. Diabetes.  2. Hypercholesterolemia.  3. Coronary  disease with patent grafts in 2006.  4. Episode in April 2005, with elevated enzymes and electrocardiographic      changes.  There was a wall motion abnormality but this improved and the      etiology was never clear.  5. History of hysterectomy.  6. Cholecystectomy.  7. Pancreatitis in the past.  8. Ongoing abdominal discomfort.   Cardiac status is stable.  I will see her back in 1 year.  No change in her  meds.  Her lipids are under good control.                               Luis Abed, MD, [facility]    JDK/MedQ  DD:  11/07/2005  DT:  11/07/2005  Job #:  [id number]   cc:   C. Duane Lope,  MD

## 2010-07-29 NOTE — Cardiovascular Report (Signed)
[name], [name]                        ACCOUNT NO.:  [id number]   MEDICAL RECORD NO.:  [id number]                   PATIENT TYPE:  INP   LOCATION:  2910                                 FACILITY:  [facility]   PHYSICIAN:  Arturo Morton. Riley Kill, M.D. [facility]         DATE OF BIRTH:  1928/12/16   DATE OF PROCEDURE:  07/09/2003  DATE OF DISCHARGE:                              CARDIAC CATHETERIZATION   INDICATIONS:  [name] is a 77 year old woman who has previously had an  apical wall motion abnormality as well as three-vessel disease.  She  underwent revascularization surgery in 2000 and did well.  She presented  last night with recurrent chest pain, shortness of breath, and evidence of  positive enzymes.  She was brought to the laboratory on an intravenous  heparin and eptifibatide drip.  She presents for repeat evaluation.   PROCEDURES:  1. Left heart catheterization.  2. Selective coronary arteriography.  3. Selective left ventriculography.  4. Saphenous vein graft angiography x2.  5. Selective left internal mammary angiography.  6. Femoral closure with Angio-Seal device.  7. Interpretation of the above studies.   DESCRIPTION OF PROCEDURE:  The patient was brought to the catheterization  lab and prepped and draped in the usual fashion.  Through an anterior  puncture, the right femoral artery was easily entered.  A 6 French sheath  was placed.  Views of the left and right coronary arteries were taken with  standard Judkins catheter.  The right coronary bypass graft was engaged with  a standard JR-4 catheter.  It required an Amplatz AL1 6 [place] guiding  catheter to engage the other two vein grafts.  Internal mammary was  accomplished using a glide wire up into the left subclavian and the left  axillary artery, and pulling the internal mammary catheter up into location.  It seated well into the internal mammary artery.  There were no  complications.  I brought Dr. Antoine Poche down to the  catheterization  laboratory to review the findings with me.  She was taken off the table and  into the holding area.  10 mg of intravenous Lasix was given and oxygen was  applied because of a slightly low SA02.  I then reviewed the films with the  patient's daughters in detail.   HEMODYNAMIC DATA:  1. Central aorta:  138/85.  2. Left ventricle:  122/17.  3. No gradient on pullback across the aortic valve.   ANGIOGRAPHIC DATA:  1. Ventriculography in the RAO projection reveals a large anterior, apical     and distal inferior wall motion abnormality in the RAO projection and a     septal apical and distal lateral wall abnormality in the LAO projection.     There is what appears to be 1-2+ mitral regurgitation as well.  Ejection     fraction calculated in the RAO projection with appropriate correction was     30%.  2. The left  main coronary artery was free of critical disease.  3. The LAD has about a 90% lesion in the proximal vessel leading up to a     diagonal and there is evidence of competitive filling distally.  4. The internal mammary to the distal LAD appears to be widely patent.     There is moderate narrowing of about 40% noted in the LAD distal at a     diagonal site, but this does not appear to be critical.  5. There is a saphenous vein graft to the ramus intermedius that bifurcates.     Importantly, in this graft there is a smooth area of 60% narrowing in one     view although it appears less so in the other view.  It is unlikely that     this would be causing an acute event.  6. The circumflex proper provides a marginal branch with about 80%     narrowing.  There is subtotal occlusion of a second marginal branch with     evidence of distal competitive filling.  7. The saphenous vein graft to the marginal is patent and there is a valve     in the mid portion of this graft and some slight haziness and perhaps up     to 30-40% narrowing although it is a little bit difficult to  judge.  My     sense is that this is mostly a valve.  8. The right coronary artery has 40-50% proximal narrowing, tandem lesions     of 70% in the mid vessel, 90% distally and then is totally occluded.  9. The saphenous vein graft to the PDA which supplies the posterior lateral     system is widely patent.   CONCLUSION:  1. Continued patency of the internal mammary to the left anterior     descending.  2. Continued patency of vein grafts with the above abnormalities as noted.  3. Large apical wall motion abnormality involves the mid and distal     anterolateral wall, apex, distal inferior wall, septum and inferolateral     segment.   DISPOSITION:  The wall motion abnormalities appear to be in excess of a  single vascular territory.  Question is raised whether this is a  Takasubo  abnormality as has been recently described.  It is fairly similar in  character to those particular cases that we have seen and also to what has  been described.  I will review this with Dr. Samule Ohm.  At the present time, a  continued medical approach will be recommended.                                               Arturo Morton. Riley Kill, M.D. [facility]    TDS/MEDQ  D:  07/09/2003  T:  07/10/2003  Job:  [id number]   cc:   [name], M.D.  [address]  [place]  [place] [address]  Fax: [phone]

## 2010-07-29 NOTE — Assessment & Plan Note (Signed)
[facility] HEALTHCARE                            CARDIOLOGY OFFICE NOTE   RAENAH, MURLEY                       MRN:          [id number]  DATE:05/01/2006                            DOB:          10-07-1928    Primary cardiologist is Dr. Willa Rough.   This is a 77 year old white female patient with history of coronary  artery disease, status post CABG in 2000.  She presented with chest pain  and dizziness, and underwent cardiac catheterization on April 19, 2006.  This revealed severe 3-vessel native coronary artery disease, but  all bypass grafts were patent.  The LIMA to the LAD was patent, SVG to  the diagonal patent, SVG to the OM patent, and SVG to the PDA was widely  patent and backfilled to show the distal RCA and moderate-sized PLA.  Ejection fraction was 70%.  No wall motion abnormalities.  They felt her  chest pain and fatigue were probably noncardiac, and ordered a D-dimer,  which was stable.  The patient did have a groin bleed after the cardiac  catheterization in the hospital, which stabilized, but she says since  she has been home, her leg has been quite sore, and she has to drag it  behind her.  Sunday night she noticed a knot come up, and she called [place]  yesterday to have it checked.   CURRENT MEDICATIONS:  1. Aspirin 81 mg daily.  2. Prinivil 10 mg b.i.d.  3. Plavix 75 mg daily.  4. Lasix 20 mg p.r.n.  5. Toprol XL 50 mg daily.  6. Glipizide 5 mg t.i.d.  7. Imdur 30 mg daily.  8. Metformin 500 mg b.i.d.  9. Lipitor 20 mg nightly.  10.Augmentin.   PHYSICAL EXAMINATION:  This is a pleasant 77 year old white female who  is uncomfortable walking in to the office.  Blood pressure 131/79.  Pulse 69.  Weight 155.  NECK:  Without JVD, HDR, bruit or thyroid enlargement.  LUNGS:  Clear anterior, posterior and lateral.  HEART:  Irregular rate and rhythm at 69 beats per minute.  Normal S1 and  S2 with 1/6 systolic ejection murmur at the  left sternal border.  ABDOMEN:  Soft without organomegaly, masses, lesions, or abnormal  tenderness.  RIGHT GROIN:  She does have a small knot and hematoma at the insertion  site with slightly widened pulse distally.  It is on her upper thigh.  It is bruised, quite tender, and large hematoma.  She has intact distal  pulses.   IMPRESSION:  1. Right groin hematoma.  Rule out femoral pseudoaneurysm.  2. Chest pain.  Question etiology.  3. Coronary artery disease, status post coronary artery bypass graft      in 2000 with the above grafts stated.  4. Catheterization on April 19, 2006.  Severe 3-vessel native      coronary artery disease.  All bypass grafts patent.  Normal left      ventricular function.  5. Long history of palpitations.  Currently normal sinus rhythm with      premature atrial contractions.  6. Diabetes mellitus.  7. Hypertension.  8. Hyperlipidemia.  9. History of pancreatitis.   PLAN:  We will check a femoral ultrasound to rule out pseudoaneurysm.  I  will also check lab work, as she was anemic when she left the hospital.  Check a CBC and BMET.  With her history of dizziness and fatigue, we may  want to place an event recorder on this patient with her palpitations  and irregular heart rate.  We will have her follow up with Dr. Myrtis Ser in 1  to 2 weeks.      Jacolyn Reedy, PA-C  Electronically Signed      Madolyn Frieze. Jens Som, MD, [facility]  Electronically Signed   ML/MedQ  DD: 05/01/2006  DT: 05/01/2006  Job #: [id number]

## 2010-07-29 NOTE — Consult Note (Signed)
[name], [name]              ACCOUNT NO.:  [id number]   MEDICAL RECORD NO.:  [id number]          PATIENT TYPE:  INP   LOCATION:  0454                         FACILITY:  [facility]   PHYSICIAN:  Iva Boop, M.D. LHCDATE OF BIRTH:  03-23-1928   DATE OF CONSULTATION:  DATE OF DISCHARGE:                                   CONSULTATION   GASTROENTEROLOGY CONSULTATION NOTE   REQUESTING PHYSICIAN:  Rockland Bing, M.D. (Covering for Dr. Myrtis Ser).   REASON FOR CONSULTATION:  Nausea and abdominal pain.   HISTORY OF PRESENT ILLNESS:  [name] is a 77 year old white woman who was  admitted with nausea and chest pain, thought to be unstable angina.  She  ruled out for MI and underwent a cardiac catheterization that showed patent  coronary artery bypass grafts but native vessel disease.  At that point, the  cardiologist thought that her problems were not cardiac in nature.  Subsequent to that, she has had a fever with a temperature of 101.2.  She  has had some more nausea but less if any chest pain and crampy abdominal  pain and six loose bowel movements yesterday.  None today.  Her hemoglobin  has fallen from 11.6 on the date of admission to 9.4 without any evidence of  retroperitoneal bleed on a CT scan.  A CT of the abdomen was performed  without IV or oral contrast but revealed no significant abnormality, though  she does have diverticulosis.  Official report indicated no acute abdominal  retroperitoneal hemorrhage, bibasilar atelectasis status post  cholecystectomy, residual contrast in the kidneys, and diverticulosis.  She  denies any chronic gastrointestinal symptoms.  Notably, her granddaughter  was sick last week with a gastroenteritis.  She was keeping a 60-year-old who  had that.  She has no abnormal LFTs.  Amylase and lipase are normal as well.  As far as colon cancer screening, she does yearly Hemoccults with Dr. Marny Lowenstein  but apparently these have been negative.  I do not think  she has ever had a  sigmoidoscopy or colonoscopy.  Her urinalysis showed 7-10 white cells.  A  culture is ordered and pending.  A blood culture is negative thus far.  Stool culture is in progress.  I do not get a history of antibiotics or  other foreign travel, etc.  Her BMET is normal except for a potassium  slightly low at 3.4, glucose 114, and BUN 16.  Calcium was 7.8.  Her albumin  is low at 3.2.   PAST MEDICAL HISTORY:  1.  Coronary artery disease with coronary artery bypass grafting after      myocardial infarction in 2000.  In April 2005, she had chest pain and      positive troponins with an ejection fraction of 30% and 2+ MR but no      obvious occlusion of her grafts.  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  2.  Diabetes mellitus for 15 years plus.  3.  Hypertension.  4.  Dyslipidemia.  5.  Prior  pancreatitis.   PAST SURGICAL HISTORY:  Notable for cholecystectomy and hysterectomy.   DRUG ALLERGIES:  None.   MEDICATIONS ON ADMISSION:  1.  Aspirin 325 mg daily.  2.  Plavix 75 mg daily.  3.  Prinivil 10 mg b.i.d.  4.  Metoprolol 25 mg every eight hours.  5.  Lipitor 10 mg daily.  6.  Glyburide 5 mg t.i.d.  7.  Glucophage 500 mg b.i.d.  8.  Diazepam p.r.n.  9.  Lasix 20 mg daily.  10. Nitroglycerin p.r.n.   In the hospital, the patient is on the same medications.  She has been on  the Plavix for a number of years but she does not have drug-eluting stents  as best I can tell.   DRUG ALLERGIES:  Listed as none known but she is sensitive to codeine.   FAMILY HISTORY:  Father had heart disease in his 90s, lived to his 57s.  No  early onset heart disease.  I do not know if there is any colon cancer in  the family.   SOCIAL HISTORY:  She lives alone.  She is divorced, a remote smoker, helps  take care of her grandchildren.   REVIEW OF SYSTEMS:  The GI review of systems is otherwise negative except   for those things mentioned above.  She denies any dysphagia, chronic  heartburn, or use of gastrointestinal medication.  The remainder of the  review of systems is pertinent for chronic back pain.  All other systems  negative.   PHYSICAL EXAMINATION:  GENERAL:  Reveals a pleasant, elderly, white woman in  no acute distress.  VITAL SIGNS:  She has a temperature of 98.5, blood pressure 95/60,  respirations 20, pulse 83.  Blood glucose 172 at 11:30 a.m.  EYES:  Anicteric.  NECK:  Supple, no mass or thyromegaly.  CHEST:  Clear.  HEART:  S1, S2, no murmurs, rubs, or gallops.  ABDOMEN:  Soft with some mild left lower quadrant tenderness and minimal  guarding there but otherwise a sore abdomen where she is diffusely tender  but soft and I can palpate deeply.  Bowel sounds are present.  Rectal exam  performed in the presence of female nursing staff shows tan-yellow soft  stool with no mass.  There are some old external hemorrhoids.  The stool is  sent for Hemoccult, results pending.  EXTREMITIES:  Show no edema.  The skin is tanned without rash.  NEUROLOGIC:  She is alert and oriented x3.   LABORATORY DATA:  As summarized above.  Her MCV is normal.  Her platelet and  white count is normal.  These values are 4.9 and 175, respectively.  Her RDW  is normal.   ASSESSMENT:  I think she probably has gastroenteritis like her granddaughter  did.  I cannot explain why she had the chest pain other than referred pain  from the abdomen.  She could have had some esophageal spasm or some other  gut spasm.  She seems to be improving.   She also has a normocytic anemia that is somewhat worse with phlebotomy and  hydration but no evidence for GI blood loss.  Hemoccults are pending.   RECOMMENDATIONS/PLAN:  1.  Supportive care with Levsin/SL p.r.n.  Low residue diet.  2.  Will check B12, folate, and reticulocyte count with iron studies which      you have done. 3.  An outpatient colonoscopy seems  reasonable regarding screening, though      she should be off  Plavix.  I can see her back as an outpatient.  4.  She may be able to go home soon unless something changes.  I would      certainly follow up on the stool culture.  I would not prescribe      antibiotics right now.   I appreciate the opportunity to care for this patient.       CEG/MEDQ  D:  10/01/2004  T:  10/02/2004  Job:  [id number]   cc:   Walton Bing, M.D.   Sharlet Salina, M.D.  [address]  [place]  [place] [address]  Fax: [phone]   Willa Rough, M.D.

## 2010-07-29 NOTE — H&P (Signed)
NAMEKELLIE, MURRILL NO.:  [id number]   MEDICAL RECORD NO.:  [id number]          PATIENT TYPE:  EMS   LOCATION:  MAJO                         FACILITY:  [facility]   PHYSICIAN:  Rollene Rotunda, M.D.   DATE OF BIRTH:  [birth year]   DATE OF ADMISSION:  09/29/2004  DATE OF DISCHARGE:                                HISTORY & PHYSICAL   PRIMARY CARE PHYSICIAN:  Sharlet Salina, M.D.   CARDIOLOGIST:  Willa Rough, M.D.   REASON FOR PRESENTATION:  Evaluate patient with chest pain, probable  unstable angina.   HISTORY OF PRESENT ILLNESS:  The patient is a 77 year old with a history of  myocardial infarction in 2000 with CABG. I do not have these records but  apparently she was critically ill at that time. Her most recent  hospitalization was April, 2005 at which time she was admitted with chest  pain and positive troponins. However, catheterization demonstrated patent  grafts and no source of her MI. She did have a reduced ejection fraction of  30% with 2+ MR at that time. This was apparently new compared to her  previous baseline. It was suggested that she might have Taka Tsubo's  cardiomyopathy. There was a mention in her follow up office notes that her  ejection fraction improved.   The patient has had no further problems in the past year until tonight. She  was watching TV. She first became nauseated at around 10:30. Around 11:00  she developed chest discomfort. This was at its peak 9 out of 10. It was  slightly left of her sternum. There was some radiation to the right side of  her jaw. It was somewhat sharp. It was associated with nausea and  diaphoresis as well as light headedness. It was similar to previous angina.  She took some nitroglycerin at home with some improvement. However, when it  did not abate she called EMS. In the emergency room she has been given  sublingual nitroglycerin with resolution almost completely of her  discomfort. She did have nausea  and light headedness with some presyncope  and a slightly low blood pressure. There were no acute EKG changes. The  first point of care markers were negative.   Otherwise the patient had been doing well. She is active. She denies any  shortness of breath, paroxysmal nocturnal dyspnea or orthopnea. She does  have palpitations and has felt her heart beating quickly and hard only in  the early a.m. hours while sleeping.   PAST MEDICAL HISTORY:  1.  Coronary artery disease (last catheterization April, 2005. Left main      normal, LAD 90% proximal stenosis, 40% distal stenosis, circumflex OM1      80% stenosis, OM2 subtotal stenosis, right coronary artery 40-50%      proximal stenosis, 70% mid 90% distal stenosis followed by distal      occlusion, supraventricular tachycardia to the PDA patent, saphenous      vein graft to marginal 30-40% stenosis, saphenous vein graft to ramus      intermediate 60%,  LIMA to the LAD patent.).  1.  Diabetes mellitus x greater than 15 years.  2.  Hypertension.  3.  Hyperlipidemia.  4.  Pancreatitis.   PAST SURGICAL HISTORY:  Cholecystectomy, hysterectomy.   ALLERGIES:  None.   CURRENT MEDICATIONS:  1.  Aspirin 325 mg daily.  2.  Plavix 75 mg daily.  3.  Prinivil 10 mg b.i.d.  4.  Metoprolol 25 mg q.8h.  5.  Lipitor 10 mg daily.  6.  Glyburide 5 mg t.i.d.  7.  Glucophage 500 mg b.i.d.  8.  Diazepam p.r.n.  9.  Lasix 20 mg daily.  10. Nitroglycerin p.r.n.   SOCIAL HISTORY:  The patient lives alone. She is divorced. She smoked 30  years ago briefly. She is accompanied by her daughter today.   FAMILY HISTORY:  Contributory for her father having heart disease in his  82's but living to be in his late 84's. There was no other early onset heart  disease.   REVIEW OF SYSTEMS:  As stated in the HPI, otherwise negative for all other  systems.   PHYSICAL EXAMINATION:  VITAL SIGNS:  The patient's blood pressure is 120/70,  heart rate is 100 and  regular.  HEENT:  Eyes unremarkable. Pupils are equal, round and reactive to light.  Fundi not visualized. Oral mucosa unremarkable, upper dentures.  NECK:  No jugular venous distension. Wave form within normal limits. Carotid  upstroke brisk and symmetrical, no bruits, no thyromegaly.  LYMPHATICS:  No cervical, axillary, inguinal adenopathy.  LUNGS:  Clear to auscultation bilaterally.  BACK:  No costovertebral angle tenderness.  CHEST:  Unremarkable.  HEART:  The PMI is not displaced or sustained, S1 and S2 within normal  limits, no S3, no S4, no murmurs.  ABDOMEN:  Flat with positive bowel sounds, normal in frequency and pitch. No  bruits, rebound, guarding, no midline pulsatile mass, no hepatomegaly,  splenomegaly.  SKIN:  No rashes, no nodules.  EXTREMITIES:  Pulses 2+, no clubbing, cyanosis or edema.  NEURO:  Oriented to person, place and time. Cranial nerves II-XII grossly  intact.  Motor brisk, intact.   EKG:  Sinus tachycardia, rate 109, axis within normal limits. QT prolonged,  no acute ST, T wave changes.   LABORATORY DATA:  WBC 8.2 thousand, hemoglobin 11.6, platelets 207,000. INR  1.0, sodium 139, potassium 4.7, chloride 102, CO2 25, glucose 271, BUN 18,  creatinine 1.2. AST 26, ALT 16, alkaline phosphatase 61. Point of care  markers x1 negative.   Chest x-ray is pending.   ASSESSMENT/PLAN:  1.  Chest discomfort. The patient's chest discomfort is very suspicious for      unstable angina.  She will be treated with heparin and IV nitroglycerin.      Will use aspirin and continue her Plavix. She will be continuing her      beta blocker. She will have elective cardiac catheterization today. We      discussed the risks and benefits. She understands the procedure having      been through it before. She agrees to proceed.   1.  Cardiomyopathy. I am not sure of the patient's current ejection fraction     though a note mentions it had improved. She will continue on the       medications as listed including her Lisinopril.   1.  Diabetes. Will hold Glucophage. She will continue the other medications      except while n.p.o. She will have sliding scale Insulin.   1.  Dyslipidemia. Will continue Lipitor and check  her fasting lipid profile.       JH/MEDQ  D:  09/29/2004  T:  09/29/2004  Job:  [id number]   cc:   Sharlet Salina, M.D.  [address]  [place]  [place] [address]  Fax: [phone]

## 2010-07-29 NOTE — Discharge Summary (Signed)
[name], [name]              ACCOUNT NO.:  [id number]   MEDICAL RECORD NO.:  [id number]          PATIENT TYPE:  INP   LOCATION:  2009                         FACILITY:  [facility]   PHYSICIAN:  Hollice Espy, M.D.DATE OF BIRTH:  1928/07/22   DATE OF ADMISSION:  04/18/2006  DATE OF DISCHARGE:  04/20/2006                               DISCHARGE SUMMARY   PRIMARY CARE PHYSICIAN:  C. Duane Lope, M.D.   DISCHARGE DIAGNOSES:  1. Chest pain, status post a cardiac catheterization, which was      completely normal.  2. Orthostatic hypotension, resolved with IV fluids.  3. Diabetes mellitus.  4. Hypertension.  5. Left groin hematoma secondary to catheterization.  6. Acute on chronic anemia.  7. Deconditioning.   DISCHARGE MEDICATIONS:  The patient will continue on previous  medications:  1. Aspirin 81 p.o. daily.  2. Lipitor 10 p.o. daily.  3. Toprol 50 p.o. daily.  4. Lasix 20 p.o. daily.  5. Lisinopril 10 p.o. daily.  6. Amoxicillin 875 p.o. daily x7 days.  7. Plavix 75 p.o. daily.  8. Metformin 200 p.o. b.i.d.  9. Multivitamin daily.  10.Imdur 30 p.o. daily.  11.Glyburide 5 p.o. three times a day.   DIET:  The patient will be discharged on a low sodium, carb-modified  diet.   ACTIVITY:  She was advised to increase activity slowly and follow up  with her PCP, Dr. Tenny Craw, in one week's time.   HOSPITAL COURSE:  The patient is a 77 year old white female with past  medical history of hypertension, diabetes, and CAD complaining of chest  pain.  The patient was admitted to the [facility] Cardiology service for  episodes of uncontrolled chest pain and chest pressure which symptoms  appear to start with activity and resolve with rest.  She underwent a  cardiac catheterization by Dr. Gala Romney which was unremarkable;  however, symptoms of light-headedness and dizziness continue to persist.  [facility] requested a hospitalist for evaluation.  It was noted  that her heart  rate remained steady within the 60s and 70s, as her blood  pressure remained systolic in the range anywhere from 110s to 150a.  O2  saturations were normal.  She appeared to have no signs of orthostatic  hypotension in my evaluation.  MRI/MRA was ordered which was found to  show no evidence of acute infarct and only signs of chronic sinusitis.  After the patient's symptoms continued to persist, she also is noted to  have complications of a left groin hematoma.  This was addressed with  pressure to the area, soon resolved.  PT/OT were the last to evaluate  the patient and they recommended home health PT which was set up prior  to discharge.  Dr. Gala Romney contacted hospitalist service for transfer  to medical service, given the persistence of medical problems  with cardiology issues filled out which we kept to the patient.  After  PT/OT was set up and no other lab abnormalities could be found, it was  felt the patient had perhaps some mild  general deconditioning.  The  plan will be for her  to follow up at home with PT/OT and with her PCP as  an outpatient.      Hollice Espy, M.D.  Electronically Signed     SKK/MEDQ  D:  09/04/2006  T:  09/05/2006  Job:  [id number]   cc:   C. Duane Lope, M.D.  Bevelyn Buckles. Bensimhon, MD

## 2010-07-29 NOTE — Assessment & Plan Note (Signed)
[facility]                            CARDIOLOGY OFFICE NOTE   [name], [name]                       MRN:          [id number]  DATE:07/06/2006                            DOB:          10-21-1928    [name] is seen for cardiology followup.  In February of 2008 she had  some chest discomfort and underwent a catheterization on April 19, 2006.  She has native 3 vessel disease.  All of her grafts were patent  and she had normal LV function.    CANCELED DICTATION     Luis Abed, MD, [facility]     JDK/MedQ  DD: 07/06/2006  DT: 07/06/2006  Job #: [id number]   cc:   C. Duane Lope, M.D.

## 2010-08-02 ENCOUNTER — Other Ambulatory Visit: Payer: Self-pay | Admitting: Cardiology

## 2010-11-17 ENCOUNTER — Encounter: Payer: Self-pay | Admitting: Cardiology

## 2010-11-17 NOTE — Progress Notes (Signed)
Dr. Juluis Rainier called me today to say that the patient's potassium has been running high and she would like to stop the patient's ACE inhibitor.  From the cardiac viewpoint this will be fine.  We discussed adding 5 mg of Norvasc.  She will be sending me the patient's labs also

## 2010-11-29 ENCOUNTER — Encounter: Payer: Self-pay | Admitting: Cardiology

## 2010-11-29 DIAGNOSIS — Z951 Presence of aortocoronary bypass graft: Secondary | ICD-10-CM | POA: Insufficient documentation

## 2010-11-29 DIAGNOSIS — R0602 Shortness of breath: Secondary | ICD-10-CM | POA: Insufficient documentation

## 2010-11-29 DIAGNOSIS — R002 Palpitations: Secondary | ICD-10-CM | POA: Insufficient documentation

## 2010-11-29 DIAGNOSIS — I10 Essential (primary) hypertension: Secondary | ICD-10-CM | POA: Insufficient documentation

## 2010-11-29 DIAGNOSIS — E875 Hyperkalemia: Secondary | ICD-10-CM | POA: Insufficient documentation

## 2010-11-29 DIAGNOSIS — IMO0002 Reserved for concepts with insufficient information to code with codable children: Secondary | ICD-10-CM | POA: Insufficient documentation

## 2010-11-29 DIAGNOSIS — I739 Peripheral vascular disease, unspecified: Secondary | ICD-10-CM

## 2010-11-29 DIAGNOSIS — R42 Dizziness and giddiness: Secondary | ICD-10-CM | POA: Insufficient documentation

## 2010-11-29 DIAGNOSIS — E1165 Type 2 diabetes mellitus with hyperglycemia: Secondary | ICD-10-CM | POA: Insufficient documentation

## 2010-11-29 DIAGNOSIS — R9430 Abnormal result of cardiovascular function study, unspecified: Secondary | ICD-10-CM | POA: Insufficient documentation

## 2010-11-29 DIAGNOSIS — I2510 Atherosclerotic heart disease of native coronary artery without angina pectoris: Secondary | ICD-10-CM | POA: Insufficient documentation

## 2010-11-29 DIAGNOSIS — M489 Spondylopathy, unspecified: Secondary | ICD-10-CM | POA: Insufficient documentation

## 2010-11-29 DIAGNOSIS — E785 Hyperlipidemia, unspecified: Secondary | ICD-10-CM | POA: Insufficient documentation

## 2010-11-30 ENCOUNTER — Encounter: Payer: Self-pay | Admitting: Cardiology

## 2010-11-30 ENCOUNTER — Ambulatory Visit (INDEPENDENT_AMBULATORY_CARE_PROVIDER_SITE_OTHER): Payer: Medicare Other | Admitting: Cardiology

## 2010-11-30 DIAGNOSIS — R0602 Shortness of breath: Secondary | ICD-10-CM

## 2010-11-30 DIAGNOSIS — I2510 Atherosclerotic heart disease of native coronary artery without angina pectoris: Secondary | ICD-10-CM

## 2010-11-30 DIAGNOSIS — I779 Disorder of arteries and arterioles, unspecified: Secondary | ICD-10-CM

## 2010-11-30 DIAGNOSIS — I10 Essential (primary) hypertension: Secondary | ICD-10-CM

## 2010-11-30 NOTE — Patient Instructions (Signed)
Your physician has requested that you have a carotid duplex. This test is an ultrasound of the carotid arteries in your neck. It looks at blood flow through these arteries that supply the brain with blood. Allow one hour for this exam. There are no restrictions or special instructions.  Your physician recommends that you schedule a follow-up appointment in: 6 months with Dr. Myrtis Ser

## 2010-11-30 NOTE — Assessment & Plan Note (Signed)
Patient had mild carotid artery disease in 2000.  She has known coronary disease.  She has not had a followup carotid Doppler that I can document since 2000.  A carotid Doppler will be arranged.  There is question of a soft left carotid bruit.

## 2010-11-30 NOTE — Assessment & Plan Note (Signed)
Coronary disease is stable. No change in therapy. 

## 2010-11-30 NOTE — Assessment & Plan Note (Signed)
Shortness of breath is unchanged.  No change in therapy.

## 2010-11-30 NOTE — Assessment & Plan Note (Signed)
Blood pressure is well-controlled with the change of her medications.  No change in therapy.

## 2010-11-30 NOTE — Progress Notes (Signed)
HPI Patient is seen for followup coronary disease and shortness of breath and carotid artery disease.  I saw the patient last March, 2012.  Recently her primary physician called to let me know that her potassium was running high.  I agreed with Dr. Zachery Dauer that it would be good to change her from an ACE inhibitor to amlodipine.  This was done and she is tolerating amlodipine and her blood pressure is stable.  She has chronic shortness of breath but there is a new change.  She's not having chest pain.  She's had no syncope or presyncope. Allergies  Allergen Reactions  . Codeine     Current Outpatient Prescriptions  Medication Sig Dispense Refill  . amLODipine (NORVASC) 5 MG tablet Take 5 mg by mouth 2 (two) times daily.        Marland Kitchen aspirin 81 MG tablet Take 81 mg by mouth daily.        Marland Kitchen atorvastatin (LIPITOR) 20 MG tablet TAKE 1 TABLET BY MOUTH EVERY DAY  30 tablet  4  . clopidogrel (PLAVIX) 75 MG tablet Take 75 mg by mouth daily.        . furosemide (LASIX) 20 MG tablet Take 10 mg by mouth daily.        Marland Kitchen gabapentin (NEURONTIN) 100 MG capsule Take 100 mg by mouth 2 (two) times daily.        Marland Kitchen glyBURIDE (DIABETA) 5 MG tablet Take 5 mg by mouth 2 (two) times daily with a meal.        . metFORMIN (GLUCOPHAGE) 500 MG tablet Take 500 mg by mouth 2 (two) times daily with a meal.        . multivitamin (THERAGRAN) per tablet Take 1 tablet by mouth daily.        . nitroGLYCERIN (NITROSTAT) 0.4 MG SL tablet Place 0.4 mg under the tongue every 5 (five) minutes as needed.        . nortriptyline (PAMELOR) 25 MG capsule Take 25 mg by mouth at bedtime.        . sitaGLIPtin (JANUVIA) 100 MG tablet Take 100 mg by mouth daily.          History   Social History  . Marital Status: Divorced    Spouse Name: N/A    Number of Children: N/A  . Years of Education: N/A   Occupational History  . retired Electronics engineer   Social History Main Topics  . Smoking status: Never Smoker   . Smokeless tobacco: Not  on file   Comment: quit in the 1970's  . Alcohol Use: No  . Drug Use: No  . Sexually Active: Not on file   Other Topics Concern  . Not on file   Social History Narrative  . No narrative on file    Family History  Problem Relation Age of Onset  . Cancer Mother   . Heart failure Father   . Coronary artery disease Father   . Cancer      siblings  . Heart failure      siblings    Past Medical History  Diagnosis Date  . Shortness of breath   . CAD (coronary artery disease)     Last catheterization 2009, grafts patent, EF 70%, 80% small OM with possible mild ischemic  . Hx of CABG     2000, LIMA to LAD, SVG to diagonal, SVG to OM, SVG to posterior descending  . Ejection fraction  EF 70%,, 2009 /  EF 65%, echo, October, 2011  . Hypertension   . Palpitations   . Dyslipidemia   . Diabetes mellitus   . Pancreatitis   . Carotid artery disease     Mild, Doppler, 2000  . Dizziness     Evaluated April, 2008, no treatment needed  . Cervical spine disease     Severe neck pain, 2011  . Hyperkalemia     Her doctor Zachery Dauer, September, 2012, ACE inhibitor stopped, probable amlodipine to be  . Shortness of breath   . History of hysterectomy     Past Surgical History  Procedure Date  . Coronary artery bypass graft 2000  . Cardiac catheterization 2009  . Cholecystectomy     ROS  Patient denies fever, chills, headache, sweats, rash, change in vision, change in hearing, chest pain, cough, nausea vomiting, urinary symptoms.  All other systems are reviewed and are negative. PHYSICAL EXAM Patient is oriented to person time and place.  Affect is normal.  Head is atraumatic.  There is no xanthelasma.  Lungs are clear.  Respiratory effort is nonlabored.  Cardiac exam reveals an S1 and S2.  No clicks or significant murmurs.  There is question of a soft left carotid bruit.  Abdomen is soft.  There is no peripheral edema. Filed Vitals:   11/30/10 0955  BP: 126/74  Pulse: 104  Height:  5\' 6"  (1.676 m)  Weight: 155 lb 12.8 oz (70.67 kg)    EKG EKG is done today and reviewed by me.  There is sinus rhythm.  There are nonspecific ST-T wave changes.  ASSESSMENT & PLAN

## 2010-12-01 ENCOUNTER — Encounter (INDEPENDENT_AMBULATORY_CARE_PROVIDER_SITE_OTHER): Payer: Medicare Other | Admitting: *Deleted

## 2010-12-01 DIAGNOSIS — R0989 Other specified symptoms and signs involving the circulatory and respiratory systems: Secondary | ICD-10-CM

## 2010-12-01 DIAGNOSIS — I6529 Occlusion and stenosis of unspecified carotid artery: Secondary | ICD-10-CM

## 2010-12-05 LAB — OCCULT BLOOD X 1 CARD TO LAB, STOOL: Fecal Occult Bld: NEGATIVE

## 2010-12-05 LAB — CBC
HCT: 30.9 — ABNORMAL LOW
HCT: 33.3 — ABNORMAL LOW
HCT: 33.6 — ABNORMAL LOW
HCT: 37.2
Hemoglobin: 10.6 — ABNORMAL LOW
Hemoglobin: 11.3 — ABNORMAL LOW
Hemoglobin: 11.6 — ABNORMAL LOW
Hemoglobin: 12.7
MCHC: 34.2
MCHC: 34.4
MCV: 91.6
MCV: 92.1
MCV: 92.3
Platelets: 169
RBC: 3.36 — ABNORMAL LOW
RBC: 3.6 — ABNORMAL LOW
RBC: 3.67 — ABNORMAL LOW
RBC: 4.08
RDW: 13.5
WBC: 5.2
WBC: 5.8
WBC: 5.9

## 2010-12-05 LAB — DIFFERENTIAL
Basophils Relative: 1
Eosinophils Absolute: 0.1
Neutrophils Relative %: 61

## 2010-12-05 LAB — BASIC METABOLIC PANEL
BUN: 9
CO2: 25
Chloride: 108
GFR calc Af Amer: >60
Sodium: 139

## 2010-12-05 LAB — CARDIAC PANEL(CRET KIN+CKTOT+MB+TROPI)
CK, MB: 0.8
Total CK: 44

## 2010-12-05 LAB — LIPID PANEL
HDL: 40
LDL Cholesterol: 83
Total CHOL/HDL Ratio: 4
Triglycerides: 174 — ABNORMAL HIGH
VLDL: 35

## 2010-12-05 LAB — COMPREHENSIVE METABOLIC PANEL
ALT: 17
Alkaline Phosphatase: 78
CO2: 26
Calcium: 9.5
GFR calc non Af Amer: 47 — ABNORMAL LOW
Glucose, Bld: 312 — ABNORMAL HIGH
Potassium: 4.3
Sodium: 136

## 2010-12-05 LAB — HEPARIN LEVEL (UNFRACTIONATED): Heparin Unfractionated: 0.95 — ABNORMAL HIGH

## 2010-12-05 LAB — D-DIMER, QUANTITATIVE: D-Dimer, Quant: 0.65 — ABNORMAL HIGH

## 2010-12-05 LAB — LIPASE, BLOOD: Lipase: 44

## 2010-12-05 LAB — CK TOTAL AND CKMB (NOT AT ARMC): CK, MB: 0.8

## 2010-12-05 LAB — POCT CARDIAC MARKERS: Operator id: 151321

## 2010-12-05 LAB — TROPONIN I: Troponin I: 0.02

## 2010-12-06 ENCOUNTER — Other Ambulatory Visit: Payer: Self-pay | Admitting: Cardiology

## 2010-12-26 ENCOUNTER — Other Ambulatory Visit: Payer: Self-pay | Admitting: Cardiology

## 2011-01-03 ENCOUNTER — Other Ambulatory Visit: Payer: Self-pay | Admitting: Cardiology

## 2011-04-18 ENCOUNTER — Other Ambulatory Visit: Payer: Self-pay | Admitting: *Deleted

## 2011-04-18 MED ORDER — NITROGLYCERIN 0.4 MG SL SUBL: 0.4000 mg | SUBLINGUAL_TABLET | SUBLINGUAL | Status: DC | PRN

## 2011-04-27 ENCOUNTER — Encounter: Payer: Self-pay | Admitting: Cardiology

## 2011-05-29 ENCOUNTER — Ambulatory Visit: Payer: Medicare Other | Admitting: Cardiology

## 2011-06-06 ENCOUNTER — Other Ambulatory Visit: Payer: Self-pay | Admitting: *Deleted

## 2011-06-06 MED ORDER — ATORVASTATIN CALCIUM 20 MG PO TABS: 20.0000 mg | ORAL_TABLET | Freq: Every day | ORAL | Status: DC

## 2011-06-06 MED ORDER — CLOPIDOGREL BISULFATE 75 MG PO TABS: 75.0000 mg | ORAL_TABLET | Freq: Every day | ORAL | Status: DC

## 2011-06-06 MED ORDER — AMLODIPINE BESYLATE 5 MG PO TABS: 5.0000 mg | ORAL_TABLET | Freq: Two times a day (BID) | ORAL | Status: DC

## 2011-07-10 ENCOUNTER — Encounter: Payer: Self-pay | Admitting: Cardiology

## 2011-07-12 ENCOUNTER — Ambulatory Visit (INDEPENDENT_AMBULATORY_CARE_PROVIDER_SITE_OTHER): Payer: Medicare Other | Admitting: Cardiology

## 2011-07-12 ENCOUNTER — Encounter: Payer: Self-pay | Admitting: Cardiology

## 2011-07-12 VITALS — BP 144/76 | HR 88 | Ht 65.0 in | Wt 146.0 lb

## 2011-07-12 DIAGNOSIS — R079 Chest pain, unspecified: Secondary | ICD-10-CM

## 2011-07-12 DIAGNOSIS — I2510 Atherosclerotic heart disease of native coronary artery without angina pectoris: Secondary | ICD-10-CM

## 2011-07-12 DIAGNOSIS — R0602 Shortness of breath: Secondary | ICD-10-CM

## 2011-07-12 DIAGNOSIS — I10 Essential (primary) hypertension: Secondary | ICD-10-CM

## 2011-07-12 DIAGNOSIS — I779 Disorder of arteries and arterioles, unspecified: Secondary | ICD-10-CM

## 2011-07-12 NOTE — Assessment & Plan Note (Signed)
Patient has described some chest hurting. It is nonexertional. It is random. Her EKG reveals no change. I'm not convinced that this represents ischemia. No further workup at this time.

## 2011-07-12 NOTE — Assessment & Plan Note (Signed)
Blood pressures control today. No change in therapy. 

## 2011-07-12 NOTE — Assessment & Plan Note (Signed)
Patient is not having any marked shortness of breath. No change in therapy.

## 2011-07-12 NOTE — Progress Notes (Signed)
HPI Patient is seen today for followup shortness of breath and some chest tightness.. I saw her last September, 2012. At that time I assessed her shortness of breath and felt she was stable. She has not had any major shortness of breath. She she has had some discomfort in her chest that she describes as a "hurting feeling." He does not come with exertion. It is random. She also mentions that when sitting alone at home she gets a drawing sensation bringing her shoulders up. Sometimes she squeezes her hands with this. Etiology is unclear.   The patient has seen Dr.Deterding of the nephrology group. She says she's not sure why she seeing him. He has adjusted some of her medications. She was concerned about this and has asked me to review the medications.  Allergies  Allergen Reactions  . Codeine     Current Outpatient Prescriptions  Medication Sig Dispense Refill  . aspirin 81 MG tablet Take 81 mg by mouth daily.        Marland Kitchen atorvastatin (LIPITOR) 20 MG tablet Take 1 tablet (20 mg total) by mouth daily.  90 tablet  2  . clopidogrel (PLAVIX) 75 MG tablet Take 1 tablet (75 mg total) by mouth daily.  90 tablet  2  . furosemide (LASIX) 20 MG tablet TAKE ONE-HALF TABLET BY MOUTH EVERY DAY  30 tablet  5  . glyBURIDE (DIABETA) 5 MG tablet Take 5 mg by mouth 2 (two) times daily with a meal.        . metFORMIN (GLUCOPHAGE) 500 MG tablet Take 2 tabs in the AM and 1 tab in the PM      . multivitamin (THERAGRAN) per tablet Take 1 tablet by mouth daily.        . nitroGLYCERIN (NITROSTAT) 0.4 MG SL tablet Place 1 tablet (0.4 mg total) under the tongue every 5 (five) minutes as needed.  25 tablet  6  . sitaGLIPtin (JANUVIA) 100 MG tablet Take 100 mg by mouth daily.        . sodium bicarbonate 650 MG tablet Take 1,300 mg by mouth 2 (two) times daily.        History   Social History  . Marital Status: Divorced    Spouse Name: N/A    Number of Children: N/A  . Years of Education: N/A   Occupational  History  . retired Electronics engineer   Social History Main Topics  . Smoking status: Never Smoker   . Smokeless tobacco: Not on file   Comment: quit in the 1970's  . Alcohol Use: No  . Drug Use: No  . Sexually Active: Not on file   Other Topics Concern  . Not on file   Social History Narrative  . No narrative on file    Family History  Problem Relation Age of Onset  . Cancer Mother   . Heart failure Father   . Coronary artery disease Father   . Cancer      siblings  . Heart failure      siblings    Past Medical History  Diagnosis Date  . Shortness of breath   . CAD (coronary artery disease)     Last catheterization 2009, grafts patent, EF 70%, 80% small OM with possible mild ischemic  . Hx of CABG     2000, LIMA to LAD, SVG to diagonal, SVG to OM, SVG to posterior descending  . Ejection fraction     EF 70%,,  2009 /  EF 65%, echo, October, 2011  . Hypertension   . Palpitations   . Dyslipidemia   . Diabetes mellitus   . Pancreatitis   . Carotid artery disease     Mild, Doppler, 2000  . Dizziness     Evaluated April, 2008, no treatment needed  . Cervical spine disease     Severe neck pain, 2011  . Hyperkalemia     Her doctor Zachery Dauer, September, 2012, ACE inhibitor stopped, probable amlodipine to be  . Shortness of breath   . History of hysterectomy     Past Surgical History  Procedure Date  . Coronary artery bypass graft 2000  . Cardiac catheterization 2009  . Cholecystectomy     ROS  Patient denies fever, chills, headache, sweats, rash, change in vision, change in hearing, cough, nausea vomiting, urinary symptoms. All other systems are reviewed and are negative.  PHYSICAL EXAM Patient actually looks quite good today. There is no jugulovenous distention. Lungs are clear. Respiratory effort is nonlabored. Cardiac exam is S1 and S2. There no clicks or significant murmurs. The abdomen is soft. There is no significant peripheral edema.  Filed Vitals:    07/12/11 1101  BP: 144/76  Pulse: 88  Height: 5\' 5"  (1.651 m)  Weight: 146 lb (66.225 kg)   EKG is done today and reviewed by me.I have compared to the tracing of September, 2012. There is normal sinus rhythm. There are nonspecific ST-T wave changes. There is no significant change.  ASSESSMENT & PLAN

## 2011-07-12 NOTE — Patient Instructions (Signed)
Your physician wants you to follow-up in:  6 months. You will receive a reminder letter in the mail two months in advance. If you don't receive a letter, please call our office to schedule the follow-up appointment.   

## 2011-07-12 NOTE — Assessment & Plan Note (Signed)
At this time I feel that her cardiac status is stable. I've chosen not to proceed with any type of ischemic workup.

## 2011-07-12 NOTE — Assessment & Plan Note (Signed)
Her carotids are being followed carefully. Doppler in September, 2012 with stable.

## 2011-10-30 ENCOUNTER — Other Ambulatory Visit: Payer: Self-pay | Admitting: Cardiology

## 2011-11-17 ENCOUNTER — Inpatient Hospital Stay (HOSPITAL_COMMUNITY)
Admission: EM | Admit: 2011-11-17 | Discharge: 2011-12-06 | DRG: 028 | Disposition: A | Discharge status: Rehabilitation Facility | Payer: Medicare Other | Attending: Internal Medicine | Admitting: Internal Medicine

## 2011-11-17 ENCOUNTER — Encounter (HOSPITAL_COMMUNITY): Payer: Self-pay | Admitting: Emergency Medicine

## 2011-11-17 ENCOUNTER — Emergency Department (HOSPITAL_COMMUNITY): Payer: Medicare Other

## 2011-11-17 DIAGNOSIS — S14105A Unspecified injury at C5 level of cervical spinal cord, initial encounter: Principal | ICD-10-CM | POA: Diagnosis present

## 2011-11-17 DIAGNOSIS — Z23 Encounter for immunization: Secondary | ICD-10-CM

## 2011-11-17 DIAGNOSIS — I2510 Atherosclerotic heart disease of native coronary artery without angina pectoris: Secondary | ICD-10-CM

## 2011-11-17 DIAGNOSIS — W19XXXA Unspecified fall, initial encounter: Secondary | ICD-10-CM

## 2011-11-17 DIAGNOSIS — F329 Major depressive disorder, single episode, unspecified: Secondary | ICD-10-CM

## 2011-11-17 DIAGNOSIS — M47812 Spondylosis without myelopathy or radiculopathy, cervical region: Secondary | ICD-10-CM | POA: Diagnosis present

## 2011-11-17 DIAGNOSIS — G9340 Encephalopathy, unspecified: Secondary | ICD-10-CM | POA: Diagnosis present

## 2011-11-17 DIAGNOSIS — E119 Type 2 diabetes mellitus without complications: Secondary | ICD-10-CM

## 2011-11-17 DIAGNOSIS — E1165 Type 2 diabetes mellitus with hyperglycemia: Secondary | ICD-10-CM | POA: Diagnosis present

## 2011-11-17 DIAGNOSIS — Z7982 Long term (current) use of aspirin: Secondary | ICD-10-CM

## 2011-11-17 DIAGNOSIS — R29898 Other symptoms and signs involving the musculoskeletal system: Secondary | ICD-10-CM

## 2011-11-17 DIAGNOSIS — I10 Essential (primary) hypertension: Secondary | ICD-10-CM

## 2011-11-17 DIAGNOSIS — N390 Urinary tract infection, site not specified: Secondary | ICD-10-CM

## 2011-11-17 DIAGNOSIS — Z951 Presence of aortocoronary bypass graft: Secondary | ICD-10-CM

## 2011-11-17 DIAGNOSIS — R42 Dizziness and giddiness: Secondary | ICD-10-CM

## 2011-11-17 DIAGNOSIS — R4182 Altered mental status, unspecified: Secondary | ICD-10-CM | POA: Diagnosis present

## 2011-11-17 DIAGNOSIS — R509 Fever, unspecified: Secondary | ICD-10-CM | POA: Diagnosis not present

## 2011-11-17 DIAGNOSIS — E785 Hyperlipidemia, unspecified: Secondary | ICD-10-CM

## 2011-11-17 DIAGNOSIS — F32A Depression, unspecified: Secondary | ICD-10-CM

## 2011-11-17 DIAGNOSIS — M489 Spondylopathy, unspecified: Secondary | ICD-10-CM

## 2011-11-17 DIAGNOSIS — I779 Disorder of arteries and arterioles, unspecified: Secondary | ICD-10-CM

## 2011-11-17 DIAGNOSIS — M79603 Pain in arm, unspecified: Secondary | ICD-10-CM | POA: Diagnosis present

## 2011-11-17 LAB — CBC WITH DIFFERENTIAL/PLATELET
Basophils Absolute: 0 10*3/uL (ref 0.0–0.1)
Basophils Relative: 0 % (ref 0–1)
Eosinophils Absolute: 0 10*3/uL (ref 0.0–0.7)
Eosinophils Relative: 0 % (ref 0–5)
HCT: 37.4 % (ref 36.0–46.0)
Hemoglobin: 12.7 g/dL (ref 12.0–15.0)
Lymphocytes Relative: 15 % (ref 12–46)
Lymphs Abs: 1.5 10*3/uL (ref 0.7–4.0)
MCH: 29.8 pg (ref 26.0–34.0)
MCHC: 34 g/dL (ref 30.0–36.0)
MCV: 87.8 fL (ref 78.0–100.0)
Monocytes Absolute: 0.5 10*3/uL (ref 0.1–1.0)
Monocytes Relative: 5 % (ref 3–12)
Neutro Abs: 8 10*3/uL — ABNORMAL HIGH (ref 1.7–7.7)
Neutrophils Relative %: 80 % — ABNORMAL HIGH (ref 43–77)
Platelets: 223 10*3/uL (ref 150–400)
RBC: 4.26 MIL/uL (ref 3.87–5.11)
RDW: 12.9 % (ref 11.5–15.5)
WBC: 10 10*3/uL (ref 4.0–10.5)

## 2011-11-17 LAB — COMPREHENSIVE METABOLIC PANEL
ALT: 21 U/L (ref 0–35)
AST: 45 U/L — ABNORMAL HIGH (ref 0–37)
Albumin: 4.6 g/dL (ref 3.5–5.2)
Alkaline Phosphatase: 88 U/L (ref 39–117)
BUN: 26 mg/dL — ABNORMAL HIGH (ref 6–23)
CO2: 21 mEq/L (ref 19–32)
Calcium: 11.2 mg/dL — ABNORMAL HIGH (ref 8.4–10.5)
Chloride: 99 mEq/L (ref 96–112)
Creatinine, Ser: 1.48 mg/dL — ABNORMAL HIGH (ref 0.50–1.10)
GFR calc Af Amer: 37 mL/min — ABNORMAL LOW (ref >90)
GFR calc non Af Amer: 32 mL/min — ABNORMAL LOW (ref >90)
Glucose, Bld: 211 mg/dL — ABNORMAL HIGH (ref 70–99)
Potassium: 3.9 mEq/L (ref 3.5–5.1)
Sodium: 140 mEq/L (ref 135–145)
Total Bilirubin: 0.5 mg/dL (ref 0.3–1.2)
Total Protein: 8.3 g/dL (ref 6.0–8.3)

## 2011-11-17 LAB — PROTIME-INR
INR: 0.96 (ref 0.00–1.49)
Prothrombin Time: 13 seconds (ref 11.6–15.2)

## 2011-11-17 MED ORDER — FENTANYL CITRATE 0.05 MG/ML IJ SOLN
25.0000 ug | Freq: Once | INTRAMUSCULAR | Status: AC
  Administered 2011-11-17: 25 ug via INTRAVENOUS
  Filled 2011-11-17: qty 2

## 2011-11-17 MED ORDER — SODIUM CHLORIDE 0.9 % IV SOLN
Freq: Once | INTRAVENOUS | Status: AC
  Administered 2011-11-17: 23:00:00 via INTRAVENOUS

## 2011-11-17 NOTE — ED Notes (Signed)
Here by EMS, here s/p fall, arrives in full spinal immobilization. "tripped and fell", fell onto face and hands, takes plavix, bloody nose noted, bleeding stopped PTA, hematoma noted to R FA, c/o pain all over, no obvious deformities, MAEx4, PERRL 2mm brisk, LS CTA. Daughters phone # [phone]. No obvious deformities, no LOC. "not cooperative for EMS".

## 2011-11-17 NOTE — ED Notes (Signed)
Received bedside report from [place], [place].  Patient currently in CT.  Will continue to monitor.

## 2011-11-17 NOTE — ED Notes (Signed)
X-ray at bedside

## 2011-11-17 NOTE — ED Notes (Signed)
C-Collar removed by family members; informed family members that C-collar is important and protects patient's spine and neck.  Informed family members that C-Collar will be removed once CT scans come back clear.

## 2011-11-17 NOTE — ED Provider Notes (Signed)
History     CSN: [id number]  Arrival date & time 11/17/11  2110   First MD Initiated Contact with Patient 11/17/11 2140      Chief Complaint  Patient presents with  . Fall  . Generalized Body Aches  . Epistaxis    (Consider location/radiation/quality/duration/timing/severity/associated sxs/prior treatment) Patient is a 78 y.o. female presenting with fall. The history is provided by the patient, the EMS personnel and a relative.  Fall The accident occurred 1 to 2 hours ago. Incident: at home, unknown circumstances. She fell from an unknown height. Impact surface: unknown. The volume of blood lost was minimal. Point of impact: unknown. The pain is mild. She was not ambulatory at the scene. Pertinent negatives include no fever, no abdominal pain, no nausea, no vomiting, no hematuria and no headaches. The symptoms are aggravated by activity. She has tried nothing for the symptoms. The treatment provided mild relief.    Past Medical History  Diagnosis Date  . Shortness of breath   . CAD (coronary artery disease)     Last catheterization 2009, grafts patent, EF 70%, 80% small OM with possible mild ischemic  . Hx of CABG     2000, LIMA to LAD, SVG to diagonal, SVG to OM, SVG to posterior descending  . Ejection fraction     EF 70%,, 2009 /  EF 65%, echo, October, 2011  . Hypertension   . Palpitations   . Dyslipidemia   . Diabetes mellitus   . Pancreatitis   . Carotid artery disease     Mild, Doppler, 2000  . Dizziness     Evaluated April, 2008, no treatment needed  . Cervical spine disease     Severe neck pain, 2011  . Hyperkalemia     Her doctor Zachery Dauer, September, 2012, ACE inhibitor stopped, probable amlodipine to be  . Shortness of breath   . History of hysterectomy   . Chest pain     Chest hurting, May, 2013    Past Surgical History  Procedure Date  . Coronary artery bypass graft 2000  . Cardiac catheterization 2009  . Cholecystectomy     Family History  Problem  Relation Age of Onset  . Cancer Mother   . Heart failure Father   . Coronary artery disease Father   . Cancer      siblings  . Heart failure      siblings    History  Substance Use Topics  . Smoking status: Never Smoker   . Smokeless tobacco: Not on file   Comment: quit in the 1970's  . Alcohol Use: No    OB History    Grav Para Term Preterm Abortions TAB SAB Ect Mult Living                  Review of Systems  Constitutional: Negative for fever and fatigue.  HENT: Negative for congestion, drooling and neck pain.   Eyes: Negative for pain.  Respiratory: Negative for cough and shortness of breath.   Cardiovascular: Negative for chest pain.  Gastrointestinal: Negative for nausea, vomiting, abdominal pain and diarrhea.  Genitourinary: Negative for dysuria and hematuria.  Musculoskeletal: Negative for back pain and gait problem.  Skin: Negative for color change.  Neurological: Negative for dizziness and headaches.  Hematological: Negative for adenopathy.  Psychiatric/Behavioral: Negative for behavioral problems.  All other systems reviewed and are negative.    Allergies  Codeine  Home Medications   Current Outpatient Rx  Name Route Sig Dispense  Refill  . ASPIRIN 81 MG PO TABS Oral Take 81 mg by mouth daily.      . ATORVASTATIN CALCIUM 20 MG PO TABS Oral Take 1 tablet (20 mg total) by mouth daily. 90 tablet 2  . CLOPIDOGREL BISULFATE 75 MG PO TABS Oral Take 1 tablet (75 mg total) by mouth daily. 90 tablet 2  . FUROSEMIDE 20 MG PO TABS  TAKE ONE-HALF TABLET BY MOUTH EVERY DAY 45 tablet 2  . GLYBURIDE 5 MG PO TABS Oral Take 5 mg by mouth 2 (two) times daily with a meal.      . METFORMIN HCL 500 MG PO TABS  Take 2 tabs in the AM and 1 tab in the PM    . MULTIVITAMINS PO TABS Oral Take 1 tablet by mouth daily.      Marland Kitchen NITROGLYCERIN 0.4 MG SL SUBL Sublingual Place 1 tablet (0.4 mg total) under the tongue every 5 (five) minutes as needed. 25 tablet 6  . SITAGLIPTIN  PHOSPHATE 100 MG PO TABS Oral Take 100 mg by mouth daily.      . SODIUM BICARBONATE 650 MG PO TABS Oral Take 1,300 mg by mouth 2 (two) times daily.      BP 163/102  Pulse 109  Temp 98.2 F (36.8 C) (Oral)  Resp 18  SpO2 99%  Physical Exam  Nursing note and vitals reviewed. Constitutional: She is oriented to person, place, and time. She appears well-developed and well-nourished.  HENT:  Head: Normocephalic.  Mouth/Throat: No oropharyngeal exudate.  Eyes: Conjunctivae and EOM are normal. Pupils are equal, round, and reactive to light.  Neck: Normal range of motion. Neck supple.       No cervical ttp.   Cardiovascular: Normal rate, regular rhythm, normal heart sounds and intact distal pulses.  Exam reveals no gallop and no friction rub.   No murmur heard. Pulmonary/Chest: Effort normal and breath sounds normal. No respiratory distress. She has no wheezes.  Abdominal: Soft. Bowel sounds are normal. There is no tenderness. There is no rebound and no guarding.  Musculoskeletal: Normal range of motion. She exhibits no edema and no tenderness.  Neurological: She is alert and oriented to person, place, and time. She has normal strength. No sensory deficit.       Pt requiring repeat questioning to follow commands.   Skin: Skin is warm and dry.  Psychiatric: She has a normal mood and affect. Her behavior is normal.    ED Course  Procedures (including critical care time)  Labs Reviewed - No data to display No results found.   No diagnosis found.   Date: 11/18/2011  Rate: 119  Rhythm: sinus tachycardia  QRS Axis: normal  Intervals: normal  ST/T Wave abnormalities: normal  Conduction Disutrbances:none  Narrative Interpretation: No ST or T wave changes cw ischemia  Old EKG Reviewed: changes notede  MDM  9:58 PM 78 y.o. female pw unwitnessed fall. Pt does not remember what happened, has no complaints on exam. Family at bedside, daughter states she spoke w/ pt approx 3 hours ago and  noted pt wasn't finishing her sentences. Pt apparently fell, but cannot convey the circumstances, she called 911. Pt now slightly altered on exam, requiring repeat questioning to follow commands. Pt AFVSS, no sensory/motor deficits or speech deficits. Will get screening labs, IVF, imaging, pain control. Suspect concussion.   Consulted hospitalist for admission as pt remains altered. Will give rocephin for UTI.   Clinical Impression 1. UTI (lower urinary tract infection)  2. Altered mental status   3. Madelynn Done, MD 11/18/11 [id number]

## 2011-11-17 NOTE — ED Notes (Signed)
Patient back from CT; currently resting quietly in bed; no respiratory or acute distress noted.  Patient complaining of pain in left shoulder; complaining of pain.  EDP aware.  Dr. Romeo Apple at bedside speaking with family.  Patient has no other questions or concerns at this time; will continue to monitor.

## 2011-11-18 ENCOUNTER — Emergency Department (HOSPITAL_COMMUNITY): Payer: Medicare Other

## 2011-11-18 DIAGNOSIS — R4182 Altered mental status, unspecified: Secondary | ICD-10-CM

## 2011-11-18 DIAGNOSIS — W19XXXA Unspecified fall, initial encounter: Secondary | ICD-10-CM | POA: Diagnosis present

## 2011-11-18 DIAGNOSIS — N390 Urinary tract infection, site not specified: Secondary | ICD-10-CM

## 2011-11-18 LAB — GLUCOSE, CAPILLARY
Glucose-Capillary: 160 mg/dL — ABNORMAL HIGH (ref 70–99)
Glucose-Capillary: 162 mg/dL — ABNORMAL HIGH (ref 70–99)

## 2011-11-18 LAB — CBC
MCH: 30 pg (ref 26.0–34.0)
MCHC: 34.5 g/dL (ref 30.0–36.0)
Platelets: 204 10*3/uL (ref 150–400)
RDW: 12.9 % (ref 11.5–15.5)

## 2011-11-18 LAB — URINE MICROSCOPIC-ADD ON

## 2011-11-18 LAB — URINALYSIS, ROUTINE W REFLEX MICROSCOPIC
Bilirubin Urine: NEGATIVE
Glucose, UA: 250 mg/dL — AB
Ketones, ur: 15 mg/dL — AB
Nitrite: NEGATIVE
Protein, ur: NEGATIVE mg/dL
Specific Gravity, Urine: 1.013 (ref 1.005–1.030)
Urobilinogen, UA: 0.2 mg/dL (ref 0.0–1.0)
pH: 6 (ref 5.0–8.0)

## 2011-11-18 LAB — BASIC METABOLIC PANEL
BUN: 24 mg/dL — ABNORMAL HIGH (ref 6–23)
Calcium: 10.1 mg/dL (ref 8.4–10.5)
Creatinine, Ser: 1.17 mg/dL — ABNORMAL HIGH (ref 0.50–1.10)
GFR calc Af Amer: 49 mL/min — ABNORMAL LOW (ref >90)
GFR calc non Af Amer: 43 mL/min — ABNORMAL LOW (ref >90)
Glucose, Bld: 243 mg/dL — ABNORMAL HIGH (ref 70–99)
Potassium: 3.5 mEq/L (ref 3.5–5.1)

## 2011-11-18 MED ORDER — INSULIN ASPART 100 UNIT/ML ~~LOC~~ SOLN
0.0000 [IU] | Freq: Three times a day (TID) | SUBCUTANEOUS | Status: DC
  Administered 2011-11-18: 3 [IU] via SUBCUTANEOUS
  Administered 2011-11-20: 5 [IU] via SUBCUTANEOUS
  Administered 2011-11-20 – 2011-11-21 (×4): 2 [IU] via SUBCUTANEOUS

## 2011-11-18 MED ORDER — SODIUM CHLORIDE 0.9 % IV SOLN
INTRAVENOUS | Status: AC
  Administered 2011-11-18: 04:00:00 via INTRAVENOUS

## 2011-11-18 MED ORDER — FENTANYL CITRATE 0.05 MG/ML IJ SOLN
12.5000 ug | INTRAMUSCULAR | Status: DC | PRN
  Administered 2011-11-18 – 2011-11-19 (×3): 12.5 ug via INTRAVENOUS
  Filled 2011-11-18 (×2): qty 2

## 2011-11-18 MED ORDER — DEXTROSE 5 % IV SOLN
1.0000 g | INTRAVENOUS | Status: DC
  Administered 2011-11-18 – 2011-11-21 (×4): 1 g via INTRAVENOUS
  Filled 2011-11-18 (×5): qty 10

## 2011-11-18 MED ORDER — ADULT MULTIVITAMIN W/MINERALS CH
1.0000 | ORAL_TABLET | Freq: Every day | ORAL | Status: DC
  Administered 2011-11-18 – 2011-12-06 (×18): 1 via ORAL
  Filled 2011-11-18 (×19): qty 1

## 2011-11-18 MED ORDER — HEPARIN SODIUM (PORCINE) 5000 UNIT/ML IJ SOLN
5000.0000 [IU] | Freq: Three times a day (TID) | INTRAMUSCULAR | Status: DC
  Administered 2011-11-18 – 2011-11-25 (×22): 5000 [IU] via SUBCUTANEOUS
  Filled 2011-11-18 (×25): qty 1

## 2011-11-18 MED ORDER — SODIUM CHLORIDE 0.9 % IV BOLUS (SEPSIS): 250.0000 mL | Freq: Once | INTRAVENOUS | Status: DC

## 2011-11-18 MED ORDER — HYDROMORPHONE HCL PF 1 MG/ML IJ SOLN
0.5000 mg | Freq: Once | INTRAMUSCULAR | Status: AC
  Administered 2011-11-18: 0.5 mg via INTRAVENOUS
  Filled 2011-11-18: qty 1

## 2011-11-18 MED ORDER — CLOPIDOGREL BISULFATE 75 MG PO TABS
75.0000 mg | ORAL_TABLET | Freq: Every day | ORAL | Status: DC
  Administered 2011-11-18 – 2011-11-25 (×8): 75 mg via ORAL
  Filled 2011-11-18 (×10): qty 1

## 2011-11-18 MED ORDER — WHITE PETROLATUM GEL
Status: AC
  Filled 2011-11-18: qty 5

## 2011-11-18 MED ORDER — NITROGLYCERIN 0.4 MG SL SUBL: 0.4000 mg | SUBLINGUAL_TABLET | SUBLINGUAL | Status: DC | PRN

## 2011-11-18 MED ORDER — OXYCODONE-ACETAMINOPHEN 5-325 MG PO TABS
1.0000 | ORAL_TABLET | ORAL | Status: DC | PRN
  Administered 2011-11-18 – 2011-11-19 (×6): 1 via ORAL
  Filled 2011-11-18 (×6): qty 1

## 2011-11-18 MED ORDER — ONDANSETRON HCL 4 MG PO TABS
4.0000 mg | ORAL_TABLET | Freq: Four times a day (QID) | ORAL | Status: DC | PRN
  Administered 2011-12-06: 4 mg via ORAL
  Filled 2011-11-18 (×2): qty 1

## 2011-11-18 MED ORDER — ASPIRIN EC 81 MG PO TBEC
81.0000 mg | DELAYED_RELEASE_TABLET | Freq: Every day | ORAL | Status: DC
  Administered 2011-11-18 – 2011-11-24 (×7): 81 mg via ORAL
  Filled 2011-11-18 (×8): qty 1

## 2011-11-18 MED ORDER — ONDANSETRON HCL 4 MG/2ML IJ SOLN
4.0000 mg | Freq: Four times a day (QID) | INTRAMUSCULAR | Status: DC | PRN
  Administered 2011-11-21 – 2011-11-23 (×2): 4 mg via INTRAVENOUS
  Filled 2011-11-18 (×2): qty 2

## 2011-11-18 MED ORDER — SODIUM BICARBONATE 650 MG PO TABS
1300.0000 mg | ORAL_TABLET | Freq: Two times a day (BID) | ORAL | Status: DC
  Administered 2011-11-18 – 2011-12-06 (×36): 1300 mg via ORAL
  Filled 2011-11-18 (×39): qty 2

## 2011-11-18 MED ORDER — ATORVASTATIN CALCIUM 20 MG PO TABS
20.0000 mg | ORAL_TABLET | Freq: Every day | ORAL | Status: DC
  Administered 2011-11-18 – 2011-12-06 (×18): 20 mg via ORAL
  Filled 2011-11-18 (×19): qty 1

## 2011-11-18 MED ORDER — HALOPERIDOL LACTATE 5 MG/ML IJ SOLN
1.0000 mg | Freq: Once | INTRAMUSCULAR | Status: AC
  Administered 2011-11-18: 1 mg via INTRAVENOUS
  Filled 2011-11-18: qty 1

## 2011-11-18 MED ORDER — DEXTROSE 5 % IV SOLN
1.0000 g | Freq: Once | INTRAVENOUS | Status: AC
  Administered 2011-11-18: 1 g via INTRAVENOUS
  Filled 2011-11-18: qty 10

## 2011-11-18 NOTE — ED Notes (Signed)
Calling report now. 

## 2011-11-18 NOTE — ED Notes (Signed)
Report given to Collyer, RN on 6700.  No further questions/concerns from RN.  Informed RN that she can call back with any questions/concerns once patient arrives to floor.  Preparing patient for transport.

## 2011-11-18 NOTE — ED Notes (Signed)
New and old EKG given to and signed by Dr. Judithann Sheen.

## 2011-11-18 NOTE — ED Notes (Signed)
Patient currently resting quietly in bed; no respiratory or acute distress noted.  Patient and family updated on plan of care; informed patient that we are currently waiting on further orders/admitting orders from physician.  Patient requesting to use bedpan; patient placed on bedpan.  Will continue to monitor.

## 2011-11-18 NOTE — ED Notes (Addendum)
Patient currently moaning; when asked if patient is in pain, patient states, "yes".  When asked where patient is in pain, patient states, "I don't know, I don't know".  Patient repositioned; checked for pressure ulcers.  Admitting MD currently at bedside.  Will continue to monitor.

## 2011-11-18 NOTE — Progress Notes (Signed)
Patient ID: [name], female   DOB: 05-Aug-1928, 78 y.o.   MRN: [id number]  This is the addendum to earlier H&P note. Pt has been seen and examined at bedside. Will continue current antibiotic regimen and will obtain CBC and BMP in AM. Pt reports feeling better this AM, PT evaluation to be ordered today.   Debbora Presto, MD  [facility] Pager [phone]  If 7PM-7AM, please contact night-coverage [email or web address] Password [email or web address]

## 2011-11-18 NOTE — H&P (Signed)
[facility]                                                                                    Patient Demographics  [name], is a 78 y.o. female  CSN: [id number]  MRN: [id number]  DOB - Jul 19, 1928  Admit Date - 11/17/2011  Outpatient Primary MD for the patient is Gaye Alken, MD   With History of -  Past Medical History  Diagnosis Date  . Shortness of breath   . CAD (coronary artery disease)     Last catheterization 2009, grafts patent, EF 70%, 80% small OM with possible mild ischemic  . Hx of CABG     2000, LIMA to LAD, SVG to diagonal, SVG to OM, SVG to posterior descending  . Ejection fraction     EF 70%,, 2009 /  EF 65%, echo, October, 2011  . Hypertension   . Palpitations   . Dyslipidemia   . Diabetes mellitus   . Pancreatitis   . Carotid artery disease     Mild, Doppler, 2000  . Dizziness     Evaluated April, 2008, no treatment needed  . Cervical spine disease     Severe neck pain, 2011  . Hyperkalemia     Her doctor Zachery Dauer, September, 2012, ACE inhibitor stopped, probable amlodipine to be  . Shortness of breath   . History of hysterectomy   . Chest pain     Chest hurting, May, 2013      Past Surgical History  Procedure Date  . Coronary artery bypass graft 2000  . Cardiac catheterization 2009  . Cholecystectomy     in for   Chief Complaint  Patient presents with  . Fall  . Generalized Body Aches  . Epistaxis     HPI  [name]  is a 78 y.o. female, with above-mentioned past medical history, patient lives at home by herself, she called EMS he due to fall, patient is very poor historian and cannot give a reliable history, stressors circumstances of the fall are unclear, as well it was unwitnessed, patient can't tell if it there was any loss of consciousness, or head trauma, currently patient is confused home, and complaining of pain all over, had CT brain and cervical CT neck which did not show any evidence of  acute finding, patient appears to be clinically dehydrated, as well having elevated creatinine from her baseline, as well her urinalysis came back significant for bacteria and white blood cells in the urine.    Review of Systems    Unfortunately patient is confused can't give a reliable review of system,  Social History History  Substance Use Topics  . Smoking status: Never Smoker   . Smokeless tobacco: Not on file   Comment: quit in the 1970's  . Alcohol Use: No   Family History Family History  Problem Relation Age of Onset  . Cancer Mother   . Heart failure Father   . Coronary artery disease Father   . Cancer      siblings  . Heart failure      siblings     Prior to Admission medications   Medication  Sig Start Date End Date Taking? Authorizing Provider  aspirin 81 MG tablet Take 81 mg by mouth daily.     Yes Historical Provider, MD  atorvastatin (LIPITOR) 20 MG tablet Take 1 tablet (20 mg total) by mouth daily. 06/06/11  Yes Luis Abed, MD  clopidogrel (PLAVIX) 75 MG tablet Take 1 tablet (75 mg total) by mouth daily. 06/06/11  Yes Luis Abed, MD  furosemide (LASIX) 20 MG tablet TAKE ONE-HALF TABLET BY MOUTH EVERY DAY 10/30/11  Yes Luis Abed, MD  glyBURIDE (DIABETA) 5 MG tablet Take 5 mg by mouth 2 (two) times daily with a meal.     Yes Historical Provider, MD  metFORMIN (GLUCOPHAGE) 500 MG tablet Take 2 tabs in the AM and 1 tab in the PM   Yes Historical Provider, MD  multivitamin [facility]) per tablet Take 1 tablet by mouth daily.     Yes Historical Provider, MD  nitroGLYCERIN (NITROSTAT) 0.4 MG SL tablet Place 0.4 mg under the tongue every 5 (five) minutes as needed. For chest pain   Yes Historical Provider, MD  sitaGLIPtin (JANUVIA) 100 MG tablet Take 100 mg by mouth daily.     Yes Historical Provider, MD  sodium bicarbonate 650 MG tablet Take 1,300 mg by mouth 2 (two) times daily.   Yes Historical Provider, MD    Allergies  Allergen Reactions  . Codeine       Unknown    Physical Exam  Vitals  Blood pressure 163/102, pulse 109, temperature 98.2 F (36.8 C), temperature source Oral, resp. rate 18, SpO2 99.00%.   1. General  , elderly lying in bed in NAD,    2. appears to be condused, awake, answers questions inappropriately. 3. No F.N deficits, ALL C.Nerves Intact, Strength 5/5 all 4 extremities, Sensation intact all 4 extremities, Plantars down going.  4. Ears and Eyes appear Normal, Conjunctivae clear, PERRLA. Dry Oral Mucosa.  5. Supple Neck, No JVD, No cervical lymphadenopathy appriciated, No Carotid Bruits.  6. Symmetrical Chest wall movement, Good air movement bilaterally, CTAB.  7. RRR, No Gallops, Rubs or Murmurs, No Parasternal Heave.  8. Positive Bowel Sounds, Abdomen Soft, Non tender, No organomegaly appriciated,No rebound -guarding or rigidity.  9.  No Cyanosis, delayed Skin Turgor, No Skin Rash or Bruise.  10. Good muscle tone,  joints appear normal , no effusions, Normal ROM. Back and flank and joints were palpated without any tenderness or pain.  11. No Palpable Lymph Nodes in Neck or Axillae    Data Review  CBC  Lab 11/17/11 2230  WBC 10.0  HGB 12.7  HCT 37.4  PLT 223  MCV 87.8  MCH 29.8  MCHC 34.0  RDW 12.9  LYMPHSABS 1.5  MONOABS 0.5  EOSABS 0.0  BASOSABS 0.0  BANDABS --   ------------------------------------------------------------------------------------------------------------------  Chemistries   Lab 11/17/11 2230  NA 140  K 3.9  CL 99  CO2 21  GLUCOSE 211*  BUN 26*  CREATININE 1.48*  CALCIUM 11.2*  MG --  AST 45*  ALT 21  ALKPHOS 88  BILITOT 0.5   ------------------------------------------------------------------------------------------------------------------ CrCl is unknown because both a height and weight (above a minimum accepted value) are required for this  calculation. ------------------------------------------------------------------------------------------------------------------ No results found for this basename: TSH,T4TOTAL,FREET3,T3FREE,THYROIDAB in the last 72 hours   Coagulation profile  Lab 11/17/11 2230  INR 0.96  PROTIME --   ------------------------------------------------------------------------------------------------------------------- No results found for this basename: DDIMER:2 in the last 72 hours -------------------------------------------------------------------------------------------------------------------  Cardiac Enzymes No results found  for this basename: CK:3,CKMB:3,TROPONINI:3,MYOGLOBIN:3 in the last 168 hours ------------------------------------------------------------------------------------------------------------------ No components found with this basename: POCBNP:3   ---------------------------------------------------------------------------------------------------------------  Urinalysis    Component Value Date/Time   COLORURINE YELLOW 11/17/2011 2334   APPEARANCEUR CLOUDY* 11/17/2011 2334   LABSPEC 1.013 11/17/2011 2334   PHURINE 6.0 11/17/2011 2334   GLUCOSEU 250* 11/17/2011 2334   HGBUR TRACE* 11/17/2011 2334   BILIRUBINUR NEGATIVE 11/17/2011 2334   KETONESUR 15* 11/17/2011 2334   PROTEINUR NEGATIVE 11/17/2011 2334   UROBILINOGEN 0.2 11/17/2011 2334   NITRITE NEGATIVE 11/17/2011 2334   LEUKOCYTESUR MODERATE* 11/17/2011 2334    ----------------------------------------------------------------------------------------------------------------   Imaging results:   Ct Head Wo Contrast  11/17/2011  *RADIOLOGY REPORT*  Clinical Data:  Fall.  Head injury.  Frontal scalp hematoma and headache.  Epistaxis.  Neck pain.  CT HEAD WITHOUT CONTRAST CT CERVICAL SPINE WITHOUT CONTRAST  Technique:  Multidetector CT imaging of the head and cervical spine was performed following the standard protocol without intravenous contrast.   Multiplanar CT image reconstructions of the cervical spine were also generated.  Comparison:  Head CT on 09/05/2009  CT HEAD  Findings: There is no evidence of intracranial hemorrhage, brain edema or other signs of acute infarction.  There is no evidence of intracranial mass lesion or mass effect.  No abnormal extra-axial fluid collections are identified.  Mild cerebral atrophy and chronic small vessel disease are unchanged in appearance.  Ventricles remain stable in size.  No evidence of skull fracture.  IMPRESSION:  1.  No acute intracranial abnormality. 2.  Stable mild cerebral atrophy and chronic small vessel disease.  CT CERVICAL SPINE  Findings: No evidence of cervical spine fracture or subluxation.  Mild to moderate degenerative disc disease is seen from levels of C4 to C7.  Moderate facet DJD is also seen bilaterally, left side greater than right.  IMPRESSION:  1. Negative for acute cervical spine fracture or subluxation. 2.  Moderate cervical spondylosis, as described above.   Original Report Authenticated By: Danae Orleans, M.D.    Ct Cervical Spine Wo Contrast  11/17/2011  *RADIOLOGY REPORT*  Clinical Data:  Fall.  Head injury.  Frontal scalp hematoma and headache.  Epistaxis.  Neck pain.  CT HEAD WITHOUT CONTRAST CT CERVICAL SPINE WITHOUT CONTRAST  Technique:  Multidetector CT imaging of the head and cervical spine was performed following the standard protocol without intravenous contrast.  Multiplanar CT image reconstructions of the cervical spine were also generated.  Comparison:  Head CT on 09/05/2009  CT HEAD  Findings: There is no evidence of intracranial hemorrhage, brain edema or other signs of acute infarction.  There is no evidence of intracranial mass lesion or mass effect.  No abnormal extra-axial fluid collections are identified.  Mild cerebral atrophy and chronic small vessel disease are unchanged in appearance.  Ventricles remain stable in size.  No evidence of skull fracture.   IMPRESSION:  1.  No acute intracranial abnormality. 2.  Stable mild cerebral atrophy and chronic small vessel disease.  CT CERVICAL SPINE  Findings: No evidence of cervical spine fracture or subluxation.  Mild to moderate degenerative disc disease is seen from levels of C4 to C7.  Moderate facet DJD is also seen bilaterally, left side greater than right.  IMPRESSION:  1. Negative for acute cervical spine fracture or subluxation. 2.  Moderate cervical spondylosis, as described above.   Original Report Authenticated By: Danae Orleans, M.D.    Dg Chest Port 1 View  11/18/2011  *RADIOLOGY REPORT*  Clinical Data: Altered mental status.  Fall.  Pain.  PORTABLE CHEST - 1 VIEW  Comparison: 12/07/2009  Findings: Stable appearance of postoperative changes in the mediastinum.  Shallow inspiration. The heart size and pulmonary vascularity are normal. The lungs appear clear and expanded without focal air space disease or consolidation. No blunting of the costophrenic angles.  No pneumothorax.  Mediastinal contours appear intact.  Degenerative changes in the spine and shoulders.  No significant change since previous study.  IMPRESSION: Shallow inspiration.  No evidence of active pulmonary disease.   Original Report Authenticated By: Marlon Pel, M.D.       Active Problems:  Altered mental status  UTI (lower urinary tract infection)  Fall  DM (diabetes mellitus)  Hypertension  Dyslipidemia    1. altered mental status, this appears to be multifactorial, secondary to urinary tract infection as well do to dehydration, doesn't have any evidence of acute CVA as no focal deficits, and only complaint is fusion, as well patient might be in delirium, as her mentation much improved with one dose of halodol.  2. UTI, will start patient on Rocephin we'll follow urine culture  3 fall. Does not appear to be syncope, but as well patient is a very poor historian, will check carotid Doppler, will admit her to telemetry  unit,  4. Diabetes mellitus, will hold oral agent, will continue her on insulin sliding scale  5. Dyslipidemia, continue on statin   DVT Prophylaxis Heparin -  AM Labs Ordered, also please review Full Orders  Family Communication: Admission, patients condition and plan of care including tests being ordered have been discussed with the patient family at that who indicate understanding and agree with the plan and Code Status.  Code Status full Disposition Plan: Home Time spent in minutes : Condition GUARDED  Dorsel Flinn M.D on 11/18/2011 at 2:00 AM    [facility] Office  [phone]

## 2011-11-19 ENCOUNTER — Inpatient Hospital Stay (HOSPITAL_COMMUNITY): Payer: Medicare Other

## 2011-11-19 DIAGNOSIS — I779 Disorder of arteries and arterioles, unspecified: Secondary | ICD-10-CM

## 2011-11-19 DIAGNOSIS — R4182 Altered mental status, unspecified: Secondary | ICD-10-CM

## 2011-11-19 DIAGNOSIS — R404 Transient alteration of awareness: Secondary | ICD-10-CM

## 2011-11-19 DIAGNOSIS — I2510 Atherosclerotic heart disease of native coronary artery without angina pectoris: Secondary | ICD-10-CM

## 2011-11-19 DIAGNOSIS — N390 Urinary tract infection, site not specified: Secondary | ICD-10-CM

## 2011-11-19 LAB — CBC
HCT: 33.3 % — ABNORMAL LOW (ref 36.0–46.0)
MCHC: 33.3 g/dL (ref 30.0–36.0)
Platelets: 190 10*3/uL (ref 150–400)
RDW: 12.8 % (ref 11.5–15.5)

## 2011-11-19 LAB — BASIC METABOLIC PANEL
BUN: 17 mg/dL (ref 6–23)
GFR calc Af Amer: 59 mL/min — ABNORMAL LOW (ref >90)
GFR calc non Af Amer: 51 mL/min — ABNORMAL LOW (ref >90)
Potassium: 3 mEq/L — ABNORMAL LOW (ref 3.5–5.1)

## 2011-11-19 LAB — URINE CULTURE: Colony Count: 30000

## 2011-11-19 LAB — GLUCOSE, CAPILLARY: Glucose-Capillary: 162 mg/dL — ABNORMAL HIGH (ref 70–99)

## 2011-11-19 MED ORDER — POLYETHYLENE GLYCOL 3350 17 G PO PACK
17.0000 g | PACK | Freq: Every day | ORAL | Status: DC
  Administered 2011-11-19 – 2011-12-06 (×16): 17 g via ORAL
  Filled 2011-11-19 (×18): qty 1

## 2011-11-19 MED ORDER — MORPHINE SULFATE 2 MG/ML IJ SOLN
1.0000 mg | INTRAMUSCULAR | Status: DC | PRN
  Administered 2011-11-20 – 2011-11-24 (×9): 1 mg via INTRAVENOUS
  Filled 2011-11-19 (×10): qty 1

## 2011-11-19 MED ORDER — POTASSIUM CHLORIDE CRYS ER 20 MEQ PO TBCR
40.0000 meq | EXTENDED_RELEASE_TABLET | Freq: Two times a day (BID) | ORAL | Status: AC
  Administered 2011-11-19 (×2): 40 meq via ORAL
  Filled 2011-11-19 (×2): qty 2

## 2011-11-19 MED ORDER — OXYCODONE-ACETAMINOPHEN 5-325 MG PO TABS
1.0000 | ORAL_TABLET | ORAL | Status: DC | PRN
Start: 2011-11-19 — End: 2011-11-30
  Administered 2011-11-19 – 2011-11-20 (×8): 2 via ORAL
  Administered 2011-11-21: 1 via ORAL
  Administered 2011-11-21 – 2011-11-23 (×8): 2 via ORAL
  Administered 2011-11-23: 1 via ORAL
  Administered 2011-11-23 – 2011-11-27 (×14): 2 via ORAL
  Administered 2011-11-27: 1 via ORAL
  Administered 2011-11-28 – 2011-11-30 (×9): 2 via ORAL
  Filled 2011-11-19 (×37): qty 2
  Filled 2011-11-19: qty 1
  Filled 2011-11-19 (×5): qty 2

## 2011-11-19 MED ORDER — DOCUSATE SODIUM 100 MG PO CAPS
100.0000 mg | ORAL_CAPSULE | Freq: Two times a day (BID) | ORAL | Status: DC
  Administered 2011-11-19 – 2011-12-06 (×32): 100 mg via ORAL
  Filled 2011-11-19 (×33): qty 1

## 2011-11-19 NOTE — Progress Notes (Signed)
Patient ID: [name], female   DOB: 04/14/1928, 78 y.o.   MRN: [id number]  [facility] PROGRESS NOTE  Kandi Brusseau [id number]:[id number] DOB: Dec 13, 1928 DOA: 11/17/2011 PCP: Gaye Alken, MD  Brief narrative: Pt is 78 yo female admitted 11/18/2011 with altered mental status which was determined to be secondary to UTI.  Active Problems:  Hypertension - uncontrolled - will readjust the regimen today - will continue to monitor vitals per floor protocol   Dyslipidemia - continue statin for now   DM (diabetes mellitus) - continue current medication regimen - will readjust the regimen as indicated   UTI (lower urinary tract infection) - continue Rocephin day #2 - strict I's and O's   Altered mental status - now clinically improving - will continue to monitor menta status   Fall - PT evaluation - XRAY of the left arm unremarkable for acute fractures - supportive care with analgesia  Consultants:  None  Procedures/Studies:  Ct Head Wo Contrast Ct Cervical Spine Wo Contrast 11/17/2011    IMPRESSION:  1.  No acute intracranial abnormality. 2.  Stable mild cerebral atrophy and chronic small vessel disease.   IMPRESSION:  1. Negative for acute cervical spine fracture or subluxation. 2.  Moderate cervical spondylosis, as described above.    Dg Chest Port 1 View 11/18/2011    IMPRESSION: Shallow inspiration.  No evidence of active pulmonary disease.     Dg Shoulder Left Port 11/19/2011    IMPRESSION: No acute fracture or dislocation identified about the left shoulder.     Antibiotics:  Rocephin 11/18/2011 -->  Code Status: Full Family Communication: Pt at bedside Disposition Plan: Home when medically stable  HPI/Subjective: No events overnight.   Objective: Filed Vitals:   11/18/11 2048 11/19/11 0533 11/19/11 1000 11/19/11 1351  BP: 171/92 165/97 173/74 156/80  Pulse: 90 81 96 96  Temp: 98.7 F (37.1 C) 98.3 F (36.8 C) 98.6 F (37 C) 98.6 F (37  C)  TempSrc: Oral Oral Oral Oral  Resp: 20 20 20 20   Weight: 64.048 kg (141 lb 3.2 oz)     SpO2: 95% 97% 99% 98%    Intake/Output Summary (Last 24 hours) at 11/19/11 1605 Last data filed at 11/19/11 1529  Gross per 24 hour  Intake    905 ml  Output   1525 ml  Net   -620 ml    Exam:   General:  Pt is alert, follows commands appropriately, not in acute distress  Cardiovascular: Regular rate and rhythm, S1/S2, no murmurs, no rubs, no gallops  Respiratory: Clear to auscultation bilaterally, no wheezing, no crackles, no rhonchi  Abdomen: Soft, non tender, non distended, bowel sounds present, no guarding  Extremities: No edema, pulses DP and PT palpable bilaterally  Neuro: Grossly nonfocal  Data Reviewed: Basic Metabolic Panel:  Lab 11/19/11 5784 11/18/11 0723 11/17/11 2230  NA 140 136 140  K 3.0* 3.5 3.9  CL 104 100 99  CO2 23 20 21   GLUCOSE 181* 243* 211*  BUN 17 24* 26*  CREATININE 1.01 1.17* 1.48*  CALCIUM 9.8 10.1 11.2*  MG -- -- --  PHOS -- -- --   Liver Function Tests:  Lab 11/17/11 2230  AST 45*  ALT 21  ALKPHOS 88  BILITOT 0.5  PROT 8.3  ALBUMIN 4.6   CBC:  Lab 11/19/11 0519 11/18/11 0723 11/17/11 2230  WBC 7.5 8.3 10.0  NEUTROABS -- -- 8.0*  HGB 11.1* 12.3 12.7  HCT 33.3* 35.7* 37.4  MCV 87.6 87.1  87.8  PLT 190 204 223   CBG:  Lab 11/19/11 1120 11/19/11 0800 11/18/11 2111 11/18/11 1657 11/18/11 1150  GLUCAP 189* 162* 181* 162* 160*    Recent Results (from the past 240 hour(s))  URINE CULTURE     Status: Normal   Collection Time   11/17/11 11:34 PM      Component Value Range Status Comment   Specimen Description URINE, RANDOM   Final    Special Requests NONE   Final    Culture  Setup Time 11/18/2011 11:07   Final    Colony Count 30,000 COLONIES/ML   Final    Culture     Final    Value: Multiple bacterial morphotypes present, none predominant. Suggest appropriate recollection if clinically indicated.   Report Status 11/19/2011 FINAL    Final   MRSA PCR SCREENING     Status: Normal   Collection Time   11/18/11  3:25 AM      Component Value Range Status Comment   MRSA by PCR NEGATIVE  NEGATIVE Final      Scheduled Meds:   . aspirin EC  81 mg Oral Daily  . atorvastatin  20 mg Oral Daily  . cefTRIAXone (ROCEPHIN)  IV  1 g Intravenous Q24H  . clopidogrel  75 mg Oral Daily  . heparin  5,000 Units Subcutaneous Q8H  . insulin aspart  0-9 Units Subcutaneous TID WC  . multivitamin with minerals  1 tablet Oral Daily  . potassium chloride  40 mEq Oral BID  . sodium bicarbonate  1,300 mg Oral BID  . sodium chloride  250 mL Intravenous Once  . white petrolatum       Continuous Infusions:   . sodium chloride 75 mL/hr at 11/18/11 1500     Debbora Presto, MD  [facility] Pager [phone]  If 7PM-7AM, please contact night-coverage [email or web address] Password [email or web address] 11/19/2011, 4:05 PM   LOS: 2 days

## 2011-11-19 NOTE — Progress Notes (Signed)
PT Cancellation Note  Treatment cancelled today due to patient's refusal to participate.  Pt stated, "I just don't want to try today"  Will retry PT eval tomorrow;  Van Clines [facility] 11/19/2011, 12:13 PM

## 2011-11-19 NOTE — Progress Notes (Signed)
*  PRELIMINARY RESULTS* Vascular Ultrasound Carotid Duplex (Doppler) has been completed.  Preliminary findings: bilaterally no significant ICA stenosis with antegrade vertebral flow.  Farrel Demark, RDMS, RVT  11/19/2011, 9:51 AM

## 2011-11-20 LAB — GLUCOSE, CAPILLARY
Glucose-Capillary: 296 mg/dL — ABNORMAL HIGH (ref 70–99)
Glucose-Capillary: 169 mg/dL — ABNORMAL HIGH (ref 70–99)
Glucose-Capillary: 153 mg/dL — ABNORMAL HIGH (ref 70–99)

## 2011-11-20 LAB — CBC
HCT: 33.7 % — ABNORMAL LOW (ref 36.0–46.0)
Hemoglobin: 11.3 g/dL — ABNORMAL LOW (ref 12.0–15.0)
MCH: 29.5 pg (ref 26.0–34.0)
MCHC: 33.5 g/dL (ref 30.0–36.0)

## 2011-11-20 LAB — BASIC METABOLIC PANEL
BUN: 21 mg/dL (ref 6–23)
Calcium: 9.6 mg/dL (ref 8.4–10.5)
GFR calc non Af Amer: 48 mL/min — ABNORMAL LOW (ref >90)
Glucose, Bld: 158 mg/dL — ABNORMAL HIGH (ref 70–99)

## 2011-11-20 MED ORDER — PNEUMOCOCCAL VAC POLYVALENT 25 MCG/0.5ML IJ INJ
0.5000 mL | INJECTION | INTRAMUSCULAR | Status: AC
  Filled 2011-11-20: qty 0.5

## 2011-11-20 MED ORDER — AMLODIPINE BESYLATE 5 MG PO TABS
5.0000 mg | ORAL_TABLET | Freq: Every day | ORAL | Status: DC
  Administered 2011-11-20: 5 mg via ORAL
  Filled 2011-11-20 (×2): qty 1

## 2011-11-20 NOTE — Progress Notes (Signed)
Pt complains of tingling pain in left and right arm.  The pain starts at the elbow and works its way down to finger tips.  The slightest touch hurts.  The left hand/fingers have very limited to no mobility. Right hand able to grasp objects some but unable to completely close fist.  Pt states this started Saturday morning.  Pain meds have limited affect.  MD notified and OT ordered.  Pt also complained of new pain in upper middle back.  Heat applied without relief.   RN notified.

## 2011-11-20 NOTE — Progress Notes (Signed)
Inpatient Diabetes Program Recommendations  AACE/ADA: New Consensus Statement on Inpatient Glycemic Control  Target Ranges:  Prepandial:   less than 140 mg/dL      Peak postprandial:   less than 180 mg/dL (1-2 hours)      Critically ill patients:  140 - 180 mg/dL  Pager:  [phone] Hours:  8 am-10pm   Reason for Visit: Elevated prandial glucose: 149, 296 mg/dL  Inpatient Diabetes Program Recommendations Insulin - Meal Coverage: Add Novolog 4 units TID for meal coverage  Alfredia Client PhD, RN, BC-ADM Diabetes Coordinator  Office:  [phone] Team Pager:  [phone]

## 2011-11-20 NOTE — Progress Notes (Signed)
Utilization review completed.  

## 2011-11-20 NOTE — Progress Notes (Signed)
Patient ID: [name], female   DOB: [birth year], 78 y.o.   MRN: [id number]  [facility] PROGRESS NOTE  [name] [id number] DOB: 1928/03/27 DOA: 11/17/2011 PCP: Gaye Alken, MD  Brief narrative:  Pt is 78 yo female admitted 11/18/2011 with altered mental status which was determined to be secondary to UTI.   Active Problems:  Hypertension  - uncontrolled  - will readjust the regimen today by adding Norvasc 5 mg tab PO QD - will continue to monitor vitals per floor protocol   Dyslipidemia  - continue statin for now   DM (diabetes mellitus)  - continue current medication regimen  - will readjust the regimen as indicated   UTI (lower urinary tract infection)  - continue Rocephin day #3/5 - strict I's and O's   Altered mental status  - now clinically improving  - will continue to monitor mental status   Fall  - PT evaluation recommends HH PT - XRAY of the left arm unremarkable for acute fractures  - supportive care with analgesia   Consultants:  PT  Procedures/Studies:   Ct Head Wo Contrast  Ct Cervical Spine Wo Contrast  11/17/2011  IMPRESSION: 1. No acute intracranial abnormality. 2. Stable mild cerebral atrophy and chronic small vessel disease.  IMPRESSION: 1. Negative for acute cervical spine fracture or subluxation. 2. Moderate cervical spondylosis, as described above.  Dg Chest Port 1 View  11/18/2011  IMPRESSION: Shallow inspiration. No evidence of active pulmonary disease.  Dg Shoulder Left Port  11/19/2011  IMPRESSION: No acute fracture or dislocation identified about the left shoulder.   Antibiotics:  Rocephin 11/18/2011 -->  Code Status: Full  Family Communication: Pt at bedside  Disposition Plan: Home when medically stable with HH PT  HPI/Subjective: No events overnight.   Objective: Filed Vitals:   11/20/11 1000 11/20/11 1400 11/20/11 1800 11/20/11 2106  BP: 160/88 148/81 179/92 163/81  Pulse: 77 85 82 81  Temp: 97.9 F  (36.6 C) 98.5 F (36.9 C)  99 F (37.2 C)  TempSrc: Oral Oral  Oral  Resp: 18 18 18 18   Weight:    64.9 kg (143 lb 1.3 oz)  SpO2: 94% 96% 91% 94%    Intake/Output Summary (Last 24 hours) at 11/20/11 2157 Last data filed at 11/20/11 1700  Gross per 24 hour  Intake    580 ml  Output   1150 ml  Net   -570 ml    Exam:   General:  Pt is alert, follows commands appropriately, not in acute distress  Cardiovascular: Regular rate and rhythm, S1/S2, no murmurs, no rubs, no gallops  Respiratory: Clear to auscultation bilaterally, no wheezing, no crackles, no rhonchi  Abdomen: Soft, non tender, non distended, bowel sounds present, no guarding  Extremities: No edema, pulses DP and PT palpable bilaterally  Neuro: Grossly nonfocal  Data Reviewed: Basic Metabolic Panel:  Lab 11/20/11 8469 11/19/11 0519 11/18/11 0723 11/17/11 2230  NA 138 140 136 140  K 4.3 3.0* 3.5 3.9  CL 103 104 100 99  CO2 25 23 20 21   GLUCOSE 158* 181* 243* 211*  BUN 21 17 24* 26*  CREATININE 1.06 1.01 1.17* 1.48*  CALCIUM 9.6 9.8 10.1 11.2*  MG -- -- -- --  PHOS -- -- -- --   Liver Function Tests:  Lab 11/17/11 2230  AST 45*  ALT 21  ALKPHOS 88  BILITOT 0.5  PROT 8.3  ALBUMIN 4.6   CBC:  Lab 11/20/11 0650 11/19/11 0519 11/18/11 0723 11/17/11  2230  WBC 7.2 7.5 8.3 10.0  NEUTROABS -- -- -- 8.0*  HGB 11.3* 11.1* 12.3 12.7  HCT 33.7* 33.3* 35.7* 37.4  MCV 88.0 87.6 87.1 87.8  PLT 188 190 204 223   CBG:  Lab 11/20/11 1640 11/20/11 1151 11/20/11 0755 11/19/11 2226 11/19/11 1625  GLUCAP 169* 296* 149* 201* 135*    Recent Results (from the past 240 hour(s))  URINE CULTURE     Status: Normal   Collection Time   11/17/11 11:34 PM      Component Value Range Status Comment   Specimen Description URINE, RANDOM   Final    Special Requests NONE   Final    Culture  Setup Time 11/18/2011 11:07   Final    Colony Count 30,000 COLONIES/ML   Final    Culture     Final    Value: Multiple bacterial  morphotypes present, none predominant. Suggest appropriate recollection if clinically indicated.   Report Status 11/19/2011 FINAL   Final   MRSA PCR SCREENING     Status: Normal   Collection Time   11/18/11  3:25 AM      Component Value Range Status Comment   MRSA by PCR NEGATIVE  NEGATIVE Final      Scheduled Meds:   . aspirin EC  81 mg Oral Daily  . atorvastatin  20 mg Oral Daily  . cefTRIAXone (ROCEPHIN)  IV  1 g Intravenous Q24H  . clopidogrel  75 mg Oral Daily  . docusate sodium  100 mg Oral BID  . heparin  5,000 Units Subcutaneous Q8H  . insulin aspart  0-9 Units Subcutaneous TID WC  . multivitamin with minerals  1 tablet Oral Daily  . pneumococcal 23 valent vaccine  0.5 mL Intramuscular Tomorrow-1000  . polyethylene glycol  17 g Oral Daily  . potassium chloride  40 mEq Oral BID  . sodium bicarbonate  1,300 mg Oral BID  . sodium chloride  250 mL Intravenous Once   Continuous Infusions:    Debbora Presto, MD  [facility] Pager [phone]  If 7PM-7AM, please contact night-coverage [email or web address] Password [email or web address] 11/20/2011, 9:57 PM   LOS: 3 days

## 2011-11-20 NOTE — Progress Notes (Signed)
Physical Therapy Evaluation Patient Details Name: [name] MRN: [id number] DOB: 07-Aug-1928 Today's Date: 11/20/2011 Time: 5621-3086 PT Time Calculation (min): 24 min  PT Assessment / Plan / Recommendation Clinical Impression  78 yo female admitted with fall found to have UTI presents with decr functional mobility, with pain limiting activity; Will benefit from PT to maximize independence and safety with mobility, amb and hopefully enable safe dc home;   Noted also decr hand function bilaterally, which will certainly impact her ability to complete ADLs; Please order OT consult;   Pt is quite independent prior to this illness, and should progress well and be able to dc home; Can neighbor and daughters stay with her initially at dc?    PT Assessment  Patient needs continued PT services    Follow Up Recommendations  Home health PT;Supervision/Assistance - 24 hour    Barriers to Discharge Decreased caregiver support Not sure if daughters and neighbor can help 24 hours; will need more info; If slow progress, must consider short-term SNF for rehab as pt lives alone    Equipment Recommendations  Rolling walker with 5" wheels;3 in 1 bedside comode    Recommendations for Other Services OT consult   Frequency Min 3X/week    Precautions / Restrictions Precautions Precautions: Fall   Pertinent Vitals/Pain 8/10 bil UEs; was premedicated      Mobility  Bed Mobility Bed Mobility: Supine to Sit;Sitting - Scoot to Edge of Bed Supine to Sit: 3: Mod assist Sitting - Scoot to Edge of Bed: 4: Min guard;With rail Details for Bed Mobility Assistance: Cues for technique and sequencing of task; mod assist to elevate trunk off of bed   Transfers Transfers: Sit to Stand;Stand to Sit Sit to Stand: 3: Mod assist;With upper extremity assist;From bed Stand to Sit: 4: Min assist;To chair/3-in-1;To bed Details for Transfer Assistance: Cues for technique; Noted quite unsteady at initial sit to stnad,  and pt sat back down to bed without warning, and with uncontrolled descent, despite safety cues   Ambulation/Gait Ambulation/Gait Assistance: 1: +2 Total assist Ambulation/Gait: Patient Percentage: 80% Ambulation Distance (Feet): 15 Feet Assistive device: Rolling walker Ambulation/Gait Assistance Details: Generally unsteady gait; Pt was able to bear some weight through bil UEs on RW, though unable to close fingers to grip; Fatigued and painful Gait Pattern: Decreased step length - right;Decreased step length - left    Exercises     PT Diagnosis: Difficulty walking;Acute pain;Generalized weakness  PT Problem List: Decreased strength;Decreased range of motion;Decreased activity tolerance;Decreased balance;Decreased mobility;Decreased knowledge of use of DME;Pain PT Treatment Interventions: DME instruction;Gait training;Stair training;Functional mobility training;Therapeutic activities;Therapeutic exercise;Balance training;Patient/family education   PT Goals Acute Rehab PT Goals PT Goal Formulation: With patient Time For Goal Achievement: 12/04/11 Potential to Achieve Goals: Good Pt will go Supine/Side to Sit: with modified independence PT Goal: Supine/Side to Sit - Progress: Goal set today Pt will go Sit to Supine/Side: with modified independence PT Goal: Sit to Supine/Side - Progress: Goal set today Pt will go Sit to Stand: with modified independence PT Goal: Sit to Stand - Progress: Goal set today Pt will go Stand to Sit: with modified independence PT Goal: Stand to Sit - Progress: Goal set today Pt will Ambulate: >150 feet;with modified independence;with rolling walker;with least restrictive assistive device PT Goal: Ambulate - Progress: Goal set today Pt will Go Up / Down Stairs: 1-2 stairs;with modified independence;with rail(s) PT Goal: Up/Down Stairs - Progress: Goal set today  Visit Information  Last PT Received On: 11/20/11  Assistance Needed: +1    Subjective Data   Subjective: Reports is very independent at home; "They (meaning daughters) check in on me and offer to do things, but I've already done them" Patient Stated Goal: less pain   Prior Functioning  Home Living Lives With: Alone Available Help at Discharge: Family;Neighbor (check in and visit daily) Type of Home: House Home Access: Stairs to enter [employer] of Steps: 2 Entrance Stairs-Rails: Left Home Layout: One level Bathroom Shower/Tub: Midwife with back Prior Function Level of Independence: Independent Able to Take Stairs?: Yes Driving: Yes Vocation: Retired Comments: Used to own the [employer] Communication: No difficulties    Cognition  Overall Cognitive Status: Appears within functional limits for tasks assessed/performed Arousal/Alertness: Awake/alert Orientation Level: Appears intact for tasks assessed Behavior During Session: [facility] for tasks performed    Extremity/Trunk Assessment Right Upper Extremity Assessment RUE ROM/Strength/Tone: Deficits;Due to pain RUE ROM/Strength/Tone Deficits: Hesitant to move at all due to pain; Noted grossly decr AROM all fingers extension and flexion Left Upper Extremity Assessment LUE ROM/Strength/Tone: Deficits;Due to pain LUE ROM/Strength/Tone Deficits: Hesitant to move at all due to pain; Noted grossly decr AROM all fingers extension and flexion Right Lower Extremity Assessment RLE ROM/Strength/Tone: Deficits RLE ROM/Strength/Tone Deficits: Generalized weakness Left Lower Extremity Assessment LLE ROM/Strength/Tone: Deficits LLE ROM/Strength/Tone Deficits: Generalized weakness   Balance    End of Session PT - End of Session Equipment Utilized During Treatment: Gait belt Activity Tolerance: Patient limited by fatigue;Patient limited by pain Patient left: in chair;with call bell/phone within reach Nurse Communication:  Mobility status  GP     Van Clines [facility] [place], [place] [phone]  11/20/2011, 11:01 AM

## 2011-11-21 DIAGNOSIS — M5382 Other specified dorsopathies, cervical region: Secondary | ICD-10-CM

## 2011-11-21 LAB — BASIC METABOLIC PANEL
BUN: 20 mg/dL (ref 6–23)
CO2: 26 mEq/L (ref 19–32)
Calcium: 9.7 mg/dL (ref 8.4–10.5)
Chloride: 104 mEq/L (ref 96–112)
Creatinine, Ser: 1.05 mg/dL (ref 0.50–1.10)
GFR calc Af Amer: 56 mL/min — ABNORMAL LOW (ref >90)
GFR calc non Af Amer: 48 mL/min — ABNORMAL LOW (ref >90)
Glucose, Bld: 148 mg/dL — ABNORMAL HIGH (ref 70–99)
Potassium: 4.1 mEq/L (ref 3.5–5.1)
Sodium: 140 mEq/L (ref 135–145)

## 2011-11-21 LAB — GLUCOSE, CAPILLARY
Glucose-Capillary: 177 mg/dL — ABNORMAL HIGH (ref 70–99)
Glucose-Capillary: 140 mg/dL — ABNORMAL HIGH (ref 70–99)

## 2011-11-21 LAB — CBC
HCT: 33.8 % — ABNORMAL LOW (ref 36.0–46.0)
Hemoglobin: 11.2 g/dL — ABNORMAL LOW (ref 12.0–15.0)
MCH: 29.6 pg (ref 26.0–34.0)
MCHC: 33.1 g/dL (ref 30.0–36.0)
MCV: 89.4 fL (ref 78.0–100.0)
Platelets: 185 10*3/uL (ref 150–400)
RBC: 3.78 MIL/uL — ABNORMAL LOW (ref 3.87–5.11)
RDW: 12.8 % (ref 11.5–15.5)
WBC: 6.9 10*3/uL (ref 4.0–10.5)

## 2011-11-21 MED ORDER — INSULIN ASPART 100 UNIT/ML ~~LOC~~ SOLN
0.0000 [IU] | Freq: Two times a day (BID) | SUBCUTANEOUS | Status: DC
  Administered 2011-11-21: 1 [IU] via SUBCUTANEOUS
  Administered 2011-11-22: 3 [IU] via SUBCUTANEOUS
  Administered 2011-11-22 – 2011-11-23 (×3): 2 [IU] via SUBCUTANEOUS
  Administered 2011-11-24: 3 [IU] via SUBCUTANEOUS
  Administered 2011-11-24: 9 [IU] via SUBCUTANEOUS
  Administered 2011-11-25: 7 [IU] via SUBCUTANEOUS
  Administered 2011-11-25: 5 [IU] via SUBCUTANEOUS
  Administered 2011-11-26: 2 [IU] via SUBCUTANEOUS
  Administered 2011-11-26: 5 [IU] via SUBCUTANEOUS
  Administered 2011-11-27: 7 [IU] via SUBCUTANEOUS
  Administered 2011-11-27: 5 [IU] via SUBCUTANEOUS
  Administered 2011-11-28 (×2): 3 [IU] via SUBCUTANEOUS
  Administered 2011-11-29: 2 [IU] via SUBCUTANEOUS

## 2011-11-21 MED ORDER — AMLODIPINE BESYLATE 10 MG PO TABS
10.0000 mg | ORAL_TABLET | Freq: Every day | ORAL | Status: DC
  Administered 2011-11-21 – 2011-12-05 (×15): 10 mg via ORAL
  Filled 2011-11-21 (×18): qty 1

## 2011-11-21 NOTE — Progress Notes (Signed)
Patient ID: [name], female   DOB: June 03, 1928, 78 y.o.   MRN: [id number]  [facility] PROGRESS NOTE  [name] YQI:[id number] DOB: 11/05/1928 DOA: 11/17/2011 PCP: Gaye Alken, MD  Brief narrative:  Pt is 78 yo female admitted 11/18/2011 with altered mental status which was determined to be secondary to UTI. Pt now with significant pain in her bilateral upper extremities and unable to use arms as much.  Active Problems:  Hypertension  - uncontrolled, accelerated - we added Norvasc yesterday and will increase the dose today - will continue to monitor vitals per floor protocol and will need to readjust the regimen as indicated  Dyslipidemia  - continue statin for now   DM (diabetes mellitus)  - continue current medication regimen  - will readjust the regimen as indicated   UTI (lower urinary tract infection)  - continue Rocephin day #4/5 - strict I's and O's   Encephalopathy, acute secondary to UTI and fall - now clinically improving  - will continue to monitor mental status   Fall  - PT evaluation recommends HH PT if 24 hour supervision available - pt has no supervision at home as she lives alone - OT done as well and will proceed with placement to SNF - XRAY of the left arm unremarkable for acute fractures  - supportive care with analgesia   Bilateral arm pain - discussed with neurology and the recommendation was to obtain MRI of the cervical spine with and without contrast to rule out acute cord syndrome - please call neurology back once MRI is done if results are suggestive of neurologic etiology otherwise proceed with SNF placement  Consultants:  PT/OT  Procedures/Studies:  Ct Head Wo Contrast  Ct Cervical Spine Wo Contrast  11/17/2011  IMPRESSION: 1. No acute intracranial abnormality. 2. Stable mild cerebral atrophy and chronic small vessel disease.  IMPRESSION: 1. Negative for acute cervical spine fracture or subluxation. 2. Moderate  cervical spondylosis, as described above.   Dg Chest Port 1 View  11/18/2011  IMPRESSION: Shallow inspiration. No evidence of active pulmonary disease.   Dg Shoulder Left Port  11/19/2011  IMPRESSION: No acute fracture or dislocation identified about the left shoulder.   Antibiotics:  Rocephin 11/18/2011 --> 11/22/2011  Code Status: Full  Family Communication: Pt at bedside  Disposition Plan: SNF since pt has no supervision at home   HPI/Subjective: No events overnight. Bilateral upper extremity pain and unable to move arms.  Objective: Filed Vitals:   11/20/11 1800 11/20/11 2106 11/21/11 0603 11/21/11 0800  BP: 179/92 163/81 160/84 138/82  Pulse: 82 81 79 82  Temp:  99 F (37.2 C) 98.5 F (36.9 C) 98.4 F (36.9 C)  TempSrc:  Oral Oral Oral  Resp: 18 18 18 18   Height:  5\' 6"  (1.676 m)    Weight:  64.9 kg (143 lb 1.3 oz)    SpO2: 91% 94% 95% 96%    Intake/Output Summary (Last 24 hours) at 11/21/11 1448 Last data filed at 11/21/11 1100  Gross per 24 hour  Intake    340 ml  Output    100 ml  Net    240 ml    Exam:   General:  Pt is alert, follows commands appropriately, not in acute distress  Cardiovascular: Regular rate and rhythm, S1/S2, no murmurs, no rubs, no gallops  Respiratory: Decreased breath sounds at bases but otherwise clear to auscultation bilaterally  Abdomen: Soft, non tender, non distended, bowel sounds present, no guarding  Extremities: No  edema, pulses DP and PT palpable bilaterally, pt having difficulty to move upper extremities bilaterally   Neuro: Grossly nonfocal, strength equal in lower extremities, pt not willing to move upper extremities due to pain  Data Reviewed: Basic Metabolic Panel:  Lab 11/21/11 1610 11/20/11 0650 11/19/11 0519 11/18/11 0723 11/17/11 2230  NA 140 138 140 136 140  K 4.1 4.3 3.0* 3.5 3.9  CL 104 103 104 100 99  CO2 26 25 23 20 21   GLUCOSE 148* 158* 181* 243* 211*  BUN 20 21 17  24* 26*  CREATININE 1.05 1.06  1.01 1.17* 1.48*  CALCIUM 9.7 9.6 9.8 10.1 11.2*  MG -- -- -- -- --  PHOS -- -- -- -- --   Liver Function Tests:  Lab 11/17/11 2230  AST 45*  ALT 21  ALKPHOS 88  BILITOT 0.5  PROT 8.3  ALBUMIN 4.6   CBC:  Lab 11/21/11 0458 11/20/11 0650 11/19/11 0519 11/18/11 0723 11/17/11 2230  WBC 6.9 7.2 7.5 8.3 10.0  NEUTROABS -- -- -- -- 8.0*  HGB 11.2* 11.3* 11.1* 12.3 12.7  HCT 33.8* 33.7* 33.3* 35.7* 37.4  MCV 89.4 88.0 87.6 87.1 87.8  PLT 185 188 190 204 223   CBG:  Lab 11/21/11 0807 11/20/11 2111 11/20/11 1640 11/20/11 1151 11/20/11 0755  GLUCAP 177* 153* 169* 296* 149*    Recent Results (from the past 240 hour(s))  URINE CULTURE     Status: Normal   Collection Time   11/17/11 11:34 PM      Component Value Range Status Comment   Specimen Description URINE, RANDOM   Final    Special Requests NONE   Final    Culture  Setup Time 11/18/2011 11:07   Final    Colony Count 30,000 COLONIES/ML   Final    Culture     Final    Value: Multiple bacterial morphotypes present, none predominant. Suggest appropriate recollection if clinically indicated.   Report Status 11/19/2011 FINAL   Final   MRSA PCR SCREENING     Status: Normal   Collection Time   11/18/11  3:25 AM      Component Value Range Status Comment   MRSA by PCR NEGATIVE  NEGATIVE Final      Scheduled Meds:   . amLODipine  5 mg Oral QHS  . aspirin EC  81 mg Oral Daily  . atorvastatin  20 mg Oral Daily  . cefTRIAXone (ROCEPHIN)  IV  1 g Intravenous Q24H  . clopidogrel  75 mg Oral Daily  . docusate sodium  100 mg Oral BID  . heparin  5,000 Units Subcutaneous Q8H  . insulin aspart  0-9 Units Subcutaneous TID WC  . multivitamin with minerals  1 tablet Oral Daily  . pneumococcal 23 valent vaccine  0.5 mL Intramuscular Tomorrow-1000  . polyethylene glycol  17 g Oral Daily  . sodium bicarbonate  1,300 mg Oral BID  . sodium chloride  250 mL Intravenous Once   Continuous Infusions:    Debbora Presto, MD  [facility] Pager [phone]  If 7PM-7AM, please contact night-coverage [email or web address] Password [email or web address] 11/21/2011, 2:48 PM   LOS: 4 days

## 2011-11-21 NOTE — Evaluation (Signed)
Occupational Therapy Evaluation Patient Details Name: [name] MRN: [id number] DOB: Dec 24, 1928 Today's Date: 11/21/2011 Time: 4098-1191 OT Time Calculation (min): 33 min  OT Assessment / Plan / Recommendation Clinical Impression  This 78 yo female s/p fall and also found to have a UTI presents to acute OT with symptoms of central cord thus having the problems below. Will benefit from acute OT with follow up OT at SNF.    OT Assessment  Patient needs continued OT Services    Follow Up Recommendations  Skilled nursing facility    Barriers to Discharge Decreased caregiver support    Equipment Recommendations  Defer to next venue    Recommendations for Other Services    Frequency  Min 3X/week    Precautions / Restrictions Precautions Precautions: Fall;Cervical (L arm) Precaution Comments: Pt with hypersensitivity to touch (pain) left arm from elbow to hand Required Braces or Orthoses: Cervical Brace Cervical Brace: Soft collar;Applied in supine position Restrictions Weight Bearing Restrictions: No   Pertinent Vitals/Pain 10/10 Bil UES (although c/o more of LUE than RUE)    ADL  Eating/Feeding: Performed;Set up;Supervision/safety (with increased time and effort, using RUE only) Where Assessed - Eating/Feeding: Bed level Grooming: Simulated;Minimal assistance Where Assessed - Grooming: Unsupported sitting Upper Body Bathing: Simulated;Moderate assistance Where Assessed - Upper Body Bathing: Unsupported sitting Lower Body Bathing: Simulated;Maximal assistance Where Assessed - Lower Body Bathing: Supported sit to stand Upper Body Dressing: Simulated;+1 Total assistance Where Assessed - Upper Body Dressing: Unsupported sitting Lower Body Dressing: Simulated;+1 Total assistance Where Assessed - Lower Body Dressing: Supported sit to stand Toilet Transfer: Mining engineer Method: Sit to Barista:  (Bed to recliner 3 feet  away) Toileting - Clothing Manipulation and Hygiene: Simulated;+1 Total assistance Where Assessed - Engineer, mining and Hygiene: Standing Equipment Used:  (c-collar) Transfers/Ambulation Related to ADLs: Min A (very slow and deliberate    OT Diagnosis: Generalized weakness;Acute pain;Paresis  OT Problem List: Decreased strength;Decreased range of motion;Decreased activity tolerance;Impaired balance (sitting and/or standing);Decreased coordination;Decreased knowledge of use of DME or AE;Impaired sensation;Impaired UE functional use;Pain OT Treatment Interventions: Self-care/ADL training;Therapeutic exercise;DME and/or AE instruction;Patient/family education;Balance training   OT Goals Acute Rehab OT Goals OT Goal Formulation: With patient Time For Goal Achievement: 12/05/11 Potential to Achieve Goals: Good ADL Goals Pt Will Perform Eating: with modified independence;Supported ADL Goal: Eating - Progress: Goal set today Pt Will Perform Grooming: with set-up;with supervision;Unsupported;Standing at sink (2 tasks, attempting to use both hands) ADL Goal: Grooming - Progress: Goal set today Pt Will Perform Upper Body Bathing: with set-up;with supervision;Sitting at sink;Standing at sink;Unsupported (attempting to both hands) ADL Goal: Upper Body Bathing - Progress: Goal set today Pt Will Perform Lower Body Bathing: with min assist;Sitting at sink;Standing at sink;Unsupported (using both hands) ADL Goal: Lower Body Bathing - Progress: Goal set today Pt Will Transfer to Toilet: with supervision;Ambulation;with DME;Comfort height toilet;3-in-1;Grab bars ADL Goal: Toilet Transfer - Progress: Goal set today Pt Will Perform Toileting - Clothing Manipulation: with supervision;Standing ADL Goal: Toileting - Clothing Manipulation - Progress: Goal set today Pt Will Perform Toileting - Hygiene: with supervision;Sit to stand from 3-in-1/toilet ADL Goal: Toileting - Hygiene - Progress: Goal  set today Arm Goals Additional Arm Goal #1: Pt will be Supervison with Bil UE AROM exercises as appropriate Arm Goal: Additional Goal #1 - Progress: Goal set today Miscellaneous OT Goals Miscellaneous OT Goal #1: Pt will be Supervision with up to EOB for BADLs and transfers OT Goal: Miscellaneous Goal #  1 - Progress: Goal set today  Visit Information  Last OT Received On: 11/21/11 Assistance Needed: +1    Subjective Data  Subjective: "It feels different" sensation of RUE v. LUE (LUE feels more numb and hypersenstiive to touch from elbow down to fingers--pain) Patient Stated Goal: Did not ask   Prior Functioning  Vision/Perception  Home Living Lives With: Alone Available Help at Discharge: Family;Neighbor (can only check on) Type of Home: House Home Access: Stairs to enter [employer] of Steps: 2 Entrance Stairs-Rails: Left Home Layout: One level Bathroom Shower/Tub: Midwife with back Prior Function Level of Independence: Independent Able to Take Stairs?: Yes Driving: Yes Vocation: Retired Comments: Used to own the [employer] Communication: No difficulties Dominant Hand: Right      Cognition  Overall Cognitive Status: Appears within functional limits for tasks assessed/performed Arousal/Alertness: Awake/alert Orientation Level: Appears intact for tasks assessed Behavior During Session: [facility] for tasks performed    Extremity/Trunk Assessment Right Upper Extremity Assessment RUE ROM/Strength/Tone: Deficits;Due to pain (? central cord) RUE ROM/Strength/Tone Deficits: All movements are slow and deliberate. In supine pt can get to about 90 degrees of shoulder flexion; 3/5 elbow, wrist, forearm; 1+/5 digits with wrist flexion for finger extension RUE Sensation: WFL - Light Touch RUE Coordination: Deficits RUE Coordination Deficits: Jerky movements at times Left  Upper Extremity Assessment LUE ROM/Strength/Tone: Deficits;Due to pain (? central cord) LUE ROM/Strength/Tone Deficits: All movements are slow and deliberate. In supine pt can get to about 50 degrees of shoulder flexion with abduction component; 3/5 elbow and forearm, 1/5 wrist and digits LUE Sensation: Deficits LUE Sensation Deficits: hypersensitive to touch elbow and distally--painful LUE Coordination: Deficits   Mobility  Shoulder Instructions  Bed Mobility Bed Mobility: Supine to Sit Supine to Sit: 3: Mod assist;HOB elevated (30 degrees) Sitting - Scoot to Edge of Bed: 5: Supervision (without using arms) Transfers Transfers: Sit to Stand;Stand to Sit Sit to Stand: 4: Min assist;With upper extremity assist;From bed (only using RUE) Stand to Sit: 4: Min assist;With upper extremity assist;With armrests;To chair/3-in-1 (only using RUE)       Exercise     Balance     End of Session OT - End of Session Activity Tolerance: Patient limited by pain Patient left: in chair;with call bell/phone within reach Nurse Communication: Mobility status       Evette Georges [phone] 11/21/2011, 10:41 AM

## 2011-11-21 NOTE — Progress Notes (Signed)
Physical Therapy Treatment Patient Details Name: [name] MRN: [id number] DOB: Oct 25, 1928 Today's Date: 11/21/2011 Time: 5784-6962 PT Time Calculation (min): 30 min  PT Assessment / Plan / Recommendation Comments on Treatment Session  Noted improvements in RUE function , especially since yesterday;   Noted also decr coordination with LLE steps, further MMT showed 3+/5 strength in dorsiflexion (less than 4/5 on the right);   Also considering OT findings, feel a Neurology consult is indicated; Dr. Izola Price is aware;   Discussed the need for pt to be independent to be able to dc home; at this point must consider post-acute rehab    Follow Up Recommendations  Skilled nursing facility;Supervision/Assistance - 24 hour    Barriers to Discharge        Equipment Recommendations  Defer to next venue    Recommendations for Other Services  (Neuro Consult)  Frequency Min 3X/week   Plan Discharge plan needs to be updated    Precautions / Restrictions Precautions Precautions: Fall;Cervical Precaution Comments: Pt with hypersensitivity to touch (pain) left arm from elbow to hand Required Braces or Orthoses: Cervical Brace Cervical Brace: Soft collar;Applied in supine position   Pertinent Vitals/Pain 8/10 burning especially LUE    Mobility  Bed Mobility Bed Mobility: Supine to Sit;Sitting - Scoot to Edge of Bed;Sit to Supine Supine to Sit: 3: Mod assist;HOB elevated Sitting - Scoot to Edge of Bed: 5: Supervision Sit to Supine: 3: Mod assist Details for Bed Mobility Assistance: Cues for technique and sequencing of task; Noted much improved ability to scoot hips to EOB and let bil feet off the bed mod assist to elevate trunk off of bed   Transfers Transfers: Sit to Stand;Stand to Sit Sit to Stand: 4: Min assist;With upper extremity assist;From bed Stand to Sit: 4: Min assist;With upper extremity assist;With armrests;To chair/3-in-1 Details for Transfer Assistance: Cues fro control and  technique; Pt performed serial sit to stands x3; continues to be unsteady, though less so than yesterday   Ambulation/Gait Ambulation/Gait Assistance: 3: Mod assist Ambulation Distance (Feet): 25 Feet Assistive device: 1 person hand held assist Ambulation/Gait Assistance Details: Unilateral handheld assit given with Right hand support as LUE continues to be quite painful; noted decr bas of support with some scissoring, mostly with RLE; foot flat stepping; significantly decr step length Gait Pattern: Decreased step length - right;Decreased step length - left    Exercises     PT Diagnosis:    PT Problem List:   PT Treatment Interventions:     PT Goals Acute Rehab PT Goals Time For Goal Achievement: 12/04/11 Potential to Achieve Goals: Good Pt will go Supine/Side to Sit: with modified independence PT Goal: Supine/Side to Sit - Progress: Progressing toward goal Pt will go Sit to Supine/Side: with modified independence PT Goal: Sit to Supine/Side - Progress: Progressing toward goal Pt will go Sit to Stand: with modified independence PT Goal: Sit to Stand - Progress: Progressing toward goal Pt will go Stand to Sit: with modified independence PT Goal: Stand to Sit - Progress: Progressing toward goal Pt will Ambulate: >150 feet;with modified independence;with rolling walker;with least restrictive assistive device PT Goal: Ambulate - Progress: Progressing toward goal  Visit Information  Last PT Received On: 11/21/11 Assistance Needed: +1    Subjective Data  Subjective: became tearful at the notion of not being able to go home Patient Stated Goal: less pain   Cognition  Overall Cognitive Status: Appears within functional limits for tasks assessed/performed Arousal/Alertness: Awake/alert Orientation Level: Appears intact  for tasks assessed Behavior During Session: [facility] for tasks performed    Balance     End of Session PT - End of Session Equipment Utilized During Treatment: Gait  belt Activity Tolerance: Patient tolerated treatment well Patient left: with call bell/phone within reach (On [facility]; RN aware) Nurse Communication: Mobility status   GP     Van Clines [facility] [place], [place] [phone]  11/21/2011, 3:37 PM

## 2011-11-21 NOTE — Progress Notes (Signed)
Physical Therapy Note   Discussed this case with Lynden Ang, OT, and Annette, Case Mgr  Considering limited assist at home, this PT agrees with dc plan of more inpatient rehab at SNF level to maximize independence and safety with mobility and ADLs prior to dc home alone  Lynford Humphrey [place], [place] [phone] 11/21/2011

## 2011-11-22 ENCOUNTER — Inpatient Hospital Stay (HOSPITAL_COMMUNITY): Payer: Medicare Other

## 2011-11-22 DIAGNOSIS — E785 Hyperlipidemia, unspecified: Secondary | ICD-10-CM

## 2011-11-22 DIAGNOSIS — W19XXXA Unspecified fall, initial encounter: Secondary | ICD-10-CM

## 2011-11-22 LAB — CBC
HCT: 33.8 % — ABNORMAL LOW (ref 36.0–46.0)
MCHC: 32.8 g/dL (ref 30.0–36.0)
MCV: 90.4 fL (ref 78.0–100.0)
RDW: 12.9 % (ref 11.5–15.5)

## 2011-11-22 LAB — GLUCOSE, CAPILLARY: Glucose-Capillary: 172 mg/dL — ABNORMAL HIGH (ref 70–99)

## 2011-11-22 LAB — BASIC METABOLIC PANEL
BUN: 20 mg/dL (ref 6–23)
Creatinine, Ser: 1.04 mg/dL (ref 0.50–1.10)
GFR calc Af Amer: 57 mL/min — ABNORMAL LOW (ref >90)
GFR calc non Af Amer: 49 mL/min — ABNORMAL LOW (ref >90)
Glucose, Bld: 182 mg/dL — ABNORMAL HIGH (ref 70–99)

## 2011-11-22 NOTE — Progress Notes (Signed)
Patient went to MRI for MRI spine. Noted procedure was not completed due patient unable to tolerate procedure due to pain. MD notified.

## 2011-11-22 NOTE — Clinical Social Work Psychosocial (Signed)
     Clinical Social Work Department BRIEF PSYCHOSOCIAL ASSESSMENT 11/22/2011  Patient:  [name], [name]     Account Number:  [id number]     Admit date:  11/17/2011  Clinical Social Worker:  Delmer Islam  Date/Time:  11/22/2011 04:19 AM  Referred by:  Physician  Date Referred:  11/22/2011 Referred for  SNF Placement   Other Referral:   Interview type:  Patient Other interview type:    PSYCHOSOCIAL DATA Living Status:  ALONE Admitted from facility:   Level of care:   Primary support name:   Primary support relationship to patient:  CHILD, ADULT Degree of support available:   Patient has 3 daughters Synetta Fail [phone]), Aram Beecham [phone]) and Aggie Cosier.    CURRENT CONCERNS Current Concerns  Post-Acute Placement   Other Concerns:    SOCIAL WORK ASSESSMENT / PLAN CSW introduced self and stated purpose of visit - to discuss discharge planning. CSW explained PT/MD's recommendation for ST rehab for strengthening and safety. Patient expressed that she wants to go home, but was concerned that she would be discharged before she was really ready as she is still experiencing a lot of pain. CSW assured her that she would not be discharged until MD knows she is medically stable.    CSW gave SNF list and explained bed search process and what would happen during rehab. CSW also advised patient that with her permission we would talk with her daughters if they have any questions.  Patient reluctantly agreed to consider short-term rehab.   Assessment/plan status:   Other assessment/ plan:   Information/referral to community resources:   [facility] skilled facility list    PATIENTS/FAMILYS RESPONSE TO PLAN OF CARE: Patient was open to talking with this CSW but very hesitant about going to ST rehab. However patient agreed to consider it and was fine with CSW initiating bed search.

## 2011-11-22 NOTE — Clinical Social Work Placement (Signed)
     Clinical Social Work Department CLINICAL SOCIAL WORK PLACEMENT NOTE 11/22/2011  Patient:  [name], [name]  Account Number:  [id number] Admit date:  11/17/2011  Clinical Social Worker:  Genelle Bal, LCSW  Date/time:  11/22/2011 04:36 AM  Clinical Social Work is seeking post-discharge placement for this patient at the following level of care:   SKILLED NURSING   (*CSW will update this form in Epic as items are completed)   11/22/2011  Patient/family provided with [facility] Department of Clinical Social Works list of facilities offering this level of care within the geographic area requested by the patient (or if unable, by the patients family).  11/22/2011  Patient/family informed of their freedom to choose among providers that offer the needed level of care, that participate in Medicare, Medicaid or managed care program needed by the patient, have an available bed and are willing to accept the patient.    Patient/family informed of [facility] ownership interest in [facility], as well as of the fact that they are under no obligation to receive care at this facility.  PASARR submitted to EDS on 11/22/2011 PASARR number received from EDS on 11/22/2011  FL2 transmitted to all facilities in geographic area requested by pt/family on  11/22/2011 FL2 transmitted to all facilities within larger geographic area on   Patient informed that his/her managed care company has contracts with or will negotiate with  certain facilities, including the following:     Patient/family informed of bed offers received:   Patient chooses bed at  Physician recommends and patient chooses bed at    Patient to be transferred to  on   Patient to be transferred to facility by   The following physician request were entered in Epic:   Additional Comments:

## 2011-11-22 NOTE — Progress Notes (Signed)
Physical Therapy Treatment Patient Details Name: [name] MRN: [id number] DOB: 03-10-1929 Today's Date: 11/22/2011 Time: 4098-1191 PT Time Calculation (min): 31 min  PT Assessment / Plan / Recommendation Comments on Treatment Session  Pt participating with mobility despite pain; Seems more accepting of need for post acute rehab    Follow Up Recommendations  Skilled nursing facility;Supervision/Assistance - 24 hour    Barriers to Discharge        Equipment Recommendations  Defer to next venue    Recommendations for Other Services    Frequency Min 3X/week   Plan Discharge plan remains appropriate    Precautions / Restrictions Precautions Precautions: Fall;Cervical Precaution Comments: Pt with hypersensitivity to touch (pain) left arm from elbow to hand Required Braces or Orthoses: Cervical Brace Cervical Brace: Soft collar;Applied in supine position   Pertinent Vitals/Pain Very painful, especially LUE/hand; once sitting post amb, pt reported R abdominal pain, and wondered if she had hit her belly during the fall; RN notified of pt request for more pain meds    Mobility  Bed Mobility Bed Mobility: Supine to Sit;Sitting - Scoot to Edge of Bed Supine to Sit: 3: Mod assist;HOB flat Sitting - Scoot to Edge of Bed: 5: Supervision Details for Bed Mobility Assistance: continues to need mod assist, though lighter mod assist (pt=60%) with pt mostly pulling up with R handheld assist and support given at back / scapulae Transfers Transfers: Sit to Stand;Stand to Sit Sit to Stand: 4: Min assist;With upper extremity assist;From bed Stand to Sit: 4: Min assist;With upper extremity assist;With armrests;To chair/3-in-1 Details for Transfer Assistance: cues for safety and control and hand placement Ambulation/Gait Ambulation/Gait Assistance: 4: Min assist;3: Mod assist Ambulation Distance (Feet): 20 Feet Assistive device: 1 person hand held assist Ambulation/Gait Assistance Details:  continued unsteady gait with decr step width and dependent on RUE support Gait Pattern: Decreased step length - right;Decreased step length - left    Exercises     PT Diagnosis:    PT Problem List:   PT Treatment Interventions:     PT Goals Acute Rehab PT Goals Time For Goal Achievement: 12/04/11 Potential to Achieve Goals: Good Pt will go Supine/Side to Sit: with modified independence PT Goal: Supine/Side to Sit - Progress: Progressing toward goal Pt will go Sit to Stand: with modified independence PT Goal: Sit to Stand - Progress: Progressing toward goal Pt will go Stand to Sit: with modified independence PT Goal: Stand to Sit - Progress: Progressing toward goal Pt will Ambulate: >150 feet;with modified independence;with rolling walker;with least restrictive assistive device PT Goal: Ambulate - Progress: Progressing toward goal  Visit Information  Last PT Received On: 11/22/11 Assistance Needed: +1    Subjective Data  Subjective: Nervous about pain, but agreeable to amb Patient Stated Goal: less pain   Cognition  Overall Cognitive Status: Appears within functional limits for tasks assessed/performed Arousal/Alertness: Awake/alert Orientation Level: Appears intact for tasks assessed Behavior During Session: [facility] for tasks performed    Balance     End of Session PT - End of Session Equipment Utilized During Treatment: Gait belt Activity Tolerance: Patient tolerated treatment well Patient left: in chair;with call bell/phone within reach Nurse Communication: Mobility status   GP     Olen Pel Shepherd, [place] [phone]  11/22/2011, 4:38 PM

## 2011-11-22 NOTE — Progress Notes (Signed)
[facility]             Progress Note   Subjective: Complains of significant pain and weakness to bilateral upper extremities L>R. In fact, she has screamed in pain when I have only touched her left hand.  Objective: Vital signs in last 24 hours: Temp:  [98.3 F (36.8 C)-98.6 F (37 C)] 98.3 F (36.8 C) (09/11 1500) Pulse Rate:  [75-92] 75  (09/11 1500) Resp:  [18-20] 18  (09/11 1500) BP: (121-155)/(75-87) 151/75 mmHg (09/11 1500) SpO2:  [90 %-96 %] 93 % (09/11 1500) Weight:  [64.4 kg (141 lb 15.6 oz)] 64.4 kg (141 lb 15.6 oz) (09/10 2108) Weight change: -0.5 kg (-1 lb 1.6 oz) Last BM Date: 11/20/11  Intake/Output from previous day: 09/10 0701 - 09/11 0700 In: 530 [P.O.:480; IV Piggyback:50] Out: 503 [Urine:500; Stool:3] Total I/O In: 120 [P.O.:120] Out: -    Physical Exam: General: Alert, awake, oriented x3. HEENT: No bruits, no goiter. Heart: Regular rate and rhythm, has a SEM. Lungs: Clear to auscultation bilaterally. Abdomen: Soft, nontender, nondistended, positive bowel sounds. Extremities: No clubbing cyanosis or edema with positive pedal pulses.    Lab Results: Basic Metabolic Panel:  Basename 11/22/11 0620 11/21/11 0458  NA 138 140  K 4.0 4.1  CL 102 104  CO2 28 26  GLUCOSE 182* 148*  BUN 20 20  CREATININE 1.04 1.05  CALCIUM 9.8 9.7  MG -- --  PHOS -- --   CBC:  Basename 11/22/11 0620 11/21/11 0458  WBC 6.8 6.9  NEUTROABS -- --  HGB 11.1* 11.2*  HCT 33.8* 33.8*  MCV 90.4 89.4  PLT 215 185   CBG:  Basename 11/22/11 0949 11/22/11 0732 11/21/11 2110 11/21/11 1648 11/21/11 0807 11/20/11 2111  GLUCAP 216* 172* 140* 119* 177* 153*   Urine Drug Screen: Drugs of Abuse     Component Value Date/Time   LABOPIA  Value: POSITIVE (NOTE) Result repeated and verified. Sent for confirmatory testing* 09/06/2009 0037   COCAINSCRNUR NEGATIVE 09/06/2009 0037   LABBENZ NEGATIVE 09/06/2009 0037   AMPHETMU NEGATIVE 09/06/2009 0037     Recent  Results (from the past 240 hour(s))  URINE CULTURE     Status: Normal   Collection Time   11/17/11 11:34 PM      Component Value Range Status Comment   Specimen Description URINE, RANDOM   Final    Special Requests NONE   Final    Culture  Setup Time 11/18/2011 11:07   Final    Colony Count 30,000 COLONIES/ML   Final    Culture     Final    Value: Multiple bacterial morphotypes present, none predominant. Suggest appropriate recollection if clinically indicated.   Report Status 11/19/2011 FINAL   Final   MRSA PCR SCREENING     Status: Normal   Collection Time   11/18/11  3:25 AM      Component Value Range Status Comment   MRSA by PCR NEGATIVE  NEGATIVE Final     Studies/Results: No results found.  Medications: Scheduled Meds:   . amLODipine  10 mg Oral QHS  . aspirin EC  81 mg Oral Daily  . atorvastatin  20 mg Oral Daily  . cefTRIAXone (ROCEPHIN)  IV  1 g Intravenous Q24H  . clopidogrel  75 mg Oral Daily  . docusate sodium  100 mg Oral BID  . heparin  5,000 Units Subcutaneous Q8H  . insulin aspart  0-9 Units Subcutaneous BID  . multivitamin with  minerals  1 tablet Oral Daily  . pneumococcal 23 valent vaccine  0.5 mL Intramuscular Tomorrow-1000  . polyethylene glycol  17 g Oral Daily  . sodium bicarbonate  1,300 mg Oral BID  . sodium chloride  250 mL Intravenous Once  . DISCONTD: amLODipine  5 mg Oral QHS  . DISCONTD: insulin aspart  0-9 Units Subcutaneous TID WC   Continuous Infusions:  PRN Meds:.morphine injection, nitroGLYCERIN, ondansetron (ZOFRAN) IV, ondansetron, oxyCODONE-acetaminophen  Assessment/Plan:  Active Problems:  Hypertension  Dyslipidemia  DM (diabetes mellitus)  UTI (lower urinary tract infection)  Altered mental status  Fall   Fall -UTI presumed to have played a role in her fall. -Continue PT. -Plan currently is for SNF at [place].  UTI -Culture negative, but had already received antibiotics in the ED. -Today is day 5 of Rocephin. Will discontinue  at this point.  Bilateral UE Pain and Weakness -MRI c-spine pending after discussion with neurology. -Will need a formal neurology consult if any abnormalities on MRI. -Continue PT/OT. -Check CPK levels; if MRI negative wonder if muscle breakdown from her fall could be the cause of her pain and weakness.  Hyperlipidemia -Continue statin  Encephalopathy -Resolved -Presumed secondary to UTi and fall.   LOS: 5 days   [facility] Pager: [phone] 11/22/2011, 3:20 PM

## 2011-11-23 ENCOUNTER — Inpatient Hospital Stay (HOSPITAL_COMMUNITY): Payer: Medicare Other

## 2011-11-23 DIAGNOSIS — E119 Type 2 diabetes mellitus without complications: Secondary | ICD-10-CM

## 2011-11-23 LAB — CBC
HCT: 33.5 % — ABNORMAL LOW (ref 36.0–46.0)
MCHC: 33.1 g/dL (ref 30.0–36.0)
MCV: 90.3 fL (ref 78.0–100.0)
RDW: 12.8 % (ref 11.5–15.5)

## 2011-11-23 LAB — GLUCOSE, CAPILLARY: Glucose-Capillary: 187 mg/dL — ABNORMAL HIGH (ref 70–99)

## 2011-11-23 LAB — BASIC METABOLIC PANEL
BUN: 20 mg/dL (ref 6–23)
Chloride: 99 mEq/L (ref 96–112)
Creatinine, Ser: 0.95 mg/dL (ref 0.50–1.10)
GFR calc Af Amer: 63 mL/min — ABNORMAL LOW (ref >90)
Glucose, Bld: 160 mg/dL — ABNORMAL HIGH (ref 70–99)

## 2011-11-23 MED ORDER — ENSURE COMPLETE PO LIQD
237.0000 mL | Freq: Two times a day (BID) | ORAL | Status: DC
  Administered 2011-11-24 – 2011-12-05 (×20): 237 mL via ORAL

## 2011-11-23 MED ORDER — LORAZEPAM 2 MG/ML IJ SOLN
2.0000 mg | Freq: Once | INTRAMUSCULAR | Status: AC
  Administered 2011-11-24: 2 mg via INTRAVENOUS
  Filled 2011-11-23: qty 1

## 2011-11-23 MED ORDER — ENSURE PUDDING PO PUDG
1.0000 | Freq: Two times a day (BID) | ORAL | Status: DC
  Administered 2011-11-24 – 2011-12-06 (×15): 1 via ORAL

## 2011-11-23 MED ORDER — LORAZEPAM 2 MG/ML IJ SOLN
2.0000 mg | Freq: Once | INTRAMUSCULAR | Status: AC
  Administered 2011-11-23: 2 mg via INTRAVENOUS
  Filled 2011-11-23: qty 1

## 2011-11-23 NOTE — Progress Notes (Signed)
INITIAL ADULT NUTRITION ASSESSMENT Date: 11/23/2011   Time: 3:43 PM  Reason for Assessment: Poor PO Intake  INTERVENTION: 1. Ensure Complete po BID, each supplement provides 350 kcal and 13 grams of protein. 2. Ensure Pudding po BID, each supplement provides 170 kcal and 4 grams of protein.  3. Downgrade diet to Mechanical Soft (Dysphagia 3) per family's report of pt struggling with tough meats at mealtimes 4. RD to continue to follow nutrition care plan  DOCUMENTATION CODES Per approved criteria  -Not Applicable   ASSESSMENT: Female 78 y.o.  Dx: fall and UTI  Hx:  Past Medical History  Diagnosis Date  . Shortness of breath   . CAD (coronary artery disease)     Last catheterization 2009, grafts patent, EF 70%, 80% small OM with possible mild ischemic  . Hx of CABG     2000, LIMA to LAD, SVG to diagonal, SVG to OM, SVG to posterior descending  . Ejection fraction     EF 70%,, 2009 /  EF 65%, echo, October, 2011  . Hypertension   . Palpitations   . Dyslipidemia   . Diabetes mellitus   . Pancreatitis   . Carotid artery disease     Mild, Doppler, 2000  . Dizziness     Evaluated April, 2008, no treatment needed  . Cervical spine disease     Severe neck pain, 2011  . Hyperkalemia     Her doctor Zachery Dauer, September, 2012, ACE inhibitor stopped, probable amlodipine to be  . Shortness of breath   . History of hysterectomy   . Chest pain     Chest hurting, May, 2013   Past Surgical History  Procedure Date  . Coronary artery bypass graft 2000  . Cardiac catheterization 2009  . Cholecystectomy    Related Meds:     . amLODipine  10 mg Oral QHS  . aspirin EC  81 mg Oral Daily  . atorvastatin  20 mg Oral Daily  . clopidogrel  75 mg Oral Daily  . docusate sodium  100 mg Oral BID  . heparin  5,000 Units Subcutaneous Q8H  . insulin aspart  0-9 Units Subcutaneous BID  . LORazepam  2 mg Intravenous Once  . multivitamin with minerals  1 tablet Oral Daily  . polyethylene  glycol  17 g Oral Daily  . sodium bicarbonate  1,300 mg Oral BID  . sodium chloride  250 mL Intravenous Once   Ht: 5\' 6"  (167.6 cm)  Wt: 141 lb 15.6 oz (64.4 kg)  Ideal Wt: 59.1 kg/130 lb % Ideal Wt: 109%  Wt Readings from Last 15 Encounters:  11/22/11 141 lb 15.6 oz (64.4 kg)  07/12/11 146 lb (66.225 kg)  11/30/10 155 lb 12.8 oz (70.67 kg)  05/26/10 156 lb 12 oz (71.101 kg)  01/06/10 153 lb (69.4 kg)  12/16/09 152 lb (68.947 kg)  11/02/09 154 lb (69.854 kg)  03/15/09 151 lb (68.493 kg)  09/17/08 157 lb (71.215 kg)  Usual Wt: 155 lb % Usual Wt: 91%  Body mass index is 22.92 kg/(m^2). Weight is WNL.  Food/Nutrition Related Hx: has minimal appetite at baseline per family  Labs:  CMP     Component Value Date/Time   NA 137 11/23/2011 0650   K 4.4 11/23/2011 0650   CL 99 11/23/2011 0650   CO2 26 11/23/2011 0650   GLUCOSE 160* 11/23/2011 0650   BUN 20 11/23/2011 0650   CREATININE 0.95 11/23/2011 0650   CALCIUM 9.5 11/23/2011 0650  PROT 8.3 11/17/2011 2230   ALBUMIN 4.6 11/17/2011 2230   AST 45* 11/17/2011 2230   ALT 21 11/17/2011 2230   ALKPHOS 88 11/17/2011 2230   BILITOT 0.5 11/17/2011 2230   GFRNONAA 55* 11/23/2011 0650   GFRAA 63* 11/23/2011 0650   CBG (last 3)   Basename 11/23/11 0739 11/22/11 2229 11/22/11 0949  GLUCAP 189* 173* 216*    Intake/Output Summary (Last 24 hours) at 11/23/11 1544 Last data filed at 11/23/11 1500  Gross per 24 hour  Intake    840 ml  Output    200 ml  Net    640 ml   Diet Order: Carb Control Medium (1600 - 2000 kcal)  Supplements/Tube Feeding: none  IVF:    Estimated Nutritional Needs:   Kcal: 1480 - 1680 kcal Protein:  64 - 74 grams Fluid:  1.5 - 1.7 liters daily  Pt lives alone, admitted s/p fall. RN reports pt is eating very poorly, asked RD to see the patient. Per RN, pt consumed only 50% of one meal yesterday. Family confirms that patient's intake has been suboptimal since admission. She has been sleeping through some meals and just  eating up to half of some meals. Family bringing in Ensure for patient - discussed that this is available through the hospital. Family appreciative and agreeable.  Pt is at nutrition risk given suboptimal PO intake during this admission.  Pt with 9% wt loss x 1 year, this is not significant.  No skin breakdown noted. CBG's ranging from 173 - 216.  NUTRITION DIAGNOSIS: -Inadequate oral intake (NI-2.1).  Status: Ongoing  RELATED TO: poor appetite and lethargy  AS EVIDENCE BY: poor meal completion.  MONITORING/EVALUATION(Goals): Goal: Pt to meet >/= 90% of their estimated nutrition needs Monitor: weight trends, lab trends, I/O's, PO intake, supplement tolerance  EDUCATION NEEDS: -No education needs identified at this time  Jarold Motto MS, RD, LDN Pager: [phone] After-hours pager: [phone]

## 2011-11-23 NOTE — Progress Notes (Signed)
Physical Therapy Treatment Patient Details Name: [name] MRN: [id number] DOB: May 04, 1928 Today's Date: 11/23/2011 Time: 1191-4782 PT Time Calculation (min): 23 min  PT Assessment / Plan / Recommendation Comments on Treatment Session  Pt. still with left UE/LE weakness greater than right along with poor coordination of left extremities.  Pt. reported some vertigo early in session, supine and in sitting.  Session cut short for nurse to prepare pt. to go for MRI.    Follow Up Recommendations  Inpatient Rehab    Barriers to Discharge        Equipment Recommendations  Defer to next venue    Recommendations for Other Services    Frequency Min 3X/week   Plan Discharge plan needs to be updated    Precautions / Restrictions Precautions Precautions: Fall;Cervical Precaution Comments: Pt with hypersensitivity to touch (pain) left arm from elbow to hand Required Braces or Orthoses: Cervical Brace Cervical Brace: Soft collar;Applied in supine position Restrictions Weight Bearing Restrictions: No   Pertinent Vitals/Pain No distress; reports pain in left UE from elbow to hand, along with hypersensitivity    Mobility  Bed Mobility Bed Mobility: Supine to Sit;Sitting - Scoot to Edge of Bed Supine to Sit: 3: Mod assist;HOB flat Sitting - Scoot to Edge of Bed: 5: Supervision Sit to Supine: 4: Min assist Details for Bed Mobility Assistance: pt. keeping left arm/hand closely guarded to avoid touch and increased pain; needed mod to min assist for supine<>sit Transfers Transfers: Not assessed Ambulation/Gait Ambulation/Gait Assistance: Not tested (comment)    Exercises     PT Diagnosis:    PT Problem List:   PT Treatment Interventions:     PT Goals Acute Rehab PT Goals PT Goal: Supine/Side to Sit - Progress: Progressing toward goal PT Goal: Sit to Supine/Side - Progress: Progressing toward goal  Visit Information  Last PT Received On: 11/23/11 Assistance Needed: +1      Subjective Data  Subjective: didn't sleep well due to the pain   Cognition  Overall Cognitive Status: Appears within functional limits for tasks assessed/performed Arousal/Alertness: Awake/alert Orientation Level: Appears intact for tasks assessed Behavior During Session: [facility] for tasks performed    Balance  Balance Balance Assessed: Yes Static Sitting Balance Static Sitting - Balance Support: No upper extremity supported;Feet supported Static Sitting - Level of Assistance: 5: Stand by assistance Dynamic Sitting Balance Dynamic Sitting - Balance Support: No upper extremity supported;Feet supported Dynamic Sitting - Level of Assistance: 5: Stand by assistance Dynamic Sitting - Balance Activities: Lateral lean/weight shifting;Forward lean/weight shifting;Reaching for objects  End of Session PT - End of Session Activity Tolerance: Patient tolerated treatment well Patient left: in bed;with call bell/phone within reach;with family/visitor present Nurse Communication: Mobility status   GP     Ferman Hamming 11/23/2011, 1:49 PM Weldon Picking PT Acute Rehab Services [phone] [place] [phone]

## 2011-11-23 NOTE — Progress Notes (Signed)
[facility]             Progress Note   Subjective: Complains of significant pain and weakness to bilateral upper extremities L>R. Daughter Aram Beecham is present and is requesting a left hand xray.  Objective: Vital signs in last 24 hours: Temp:  [98.2 F (36.8 C)-98.6 F (37 C)] 98.2 F (36.8 C) (09/12 0544) Pulse Rate:  [73-83] 73  (09/12 0544) Resp:  [18] 18  (09/12 0544) BP: (136-172)/(75-85) 136/76 mmHg (09/12 0544) SpO2:  [92 %-95 %] 94 % (09/12 0544) Weight:  [64.4 kg (141 lb 15.6 oz)] 64.4 kg (141 lb 15.6 oz) (09/11 2101) Weight change: 0 kg (0 lb) Last BM Date: 11/21/11  Intake/Output from previous day: 09/11 0701 - 09/12 0700 In: 600 [P.O.:600] Out: 100 [Urine:100]     Physical Exam: General: Alert, awake, oriented x3. HEENT: No bruits, no goiter. Heart: Regular rate and rhythm, has a SEM. Lungs: Clear to auscultation bilaterally. Abdomen: Soft, nontender, nondistended, positive bowel sounds. Extremities: No clubbing cyanosis or edema with positive pedal pulses. No upper extremity deformities noted.    Lab Results: Basic Metabolic Panel:  Basename 11/23/11 0650 11/22/11 0620  NA 137 138  K 4.4 4.0  CL 99 102  CO2 26 28  GLUCOSE 160* 182*  BUN 20 20  CREATININE 0.95 1.04  CALCIUM 9.5 9.8  MG -- --  PHOS -- --   CBC:  Basename 11/23/11 0650 11/22/11 0620  WBC 6.1 6.8  NEUTROABS -- --  HGB 11.1* 11.1*  HCT 33.5* 33.8*  MCV 90.3 90.4  PLT 202 215   CBG:  Basename 11/23/11 0739 11/22/11 2229 11/22/11 0949 11/22/11 0732 11/21/11 2110 11/21/11 1648  GLUCAP 189* 173* 216* 172* 140* 119*   Urine Drug Screen: Drugs of Abuse     Component Value Date/Time   LABOPIA  Value: POSITIVE (NOTE) Result repeated and verified. Sent for confirmatory testing* 09/06/2009 0037   COCAINSCRNUR NEGATIVE 09/06/2009 0037   LABBENZ NEGATIVE 09/06/2009 0037   AMPHETMU NEGATIVE 09/06/2009 0037     Recent Results (from the past 240 hour(s))  URINE  CULTURE     Status: Normal   Collection Time   11/17/11 11:34 PM      Component Value Range Status Comment   Specimen Description URINE, RANDOM   Final    Special Requests NONE   Final    Culture  Setup Time 11/18/2011 11:07   Final    Colony Count 30,000 COLONIES/ML   Final    Culture     Final    Value: Multiple bacterial morphotypes present, none predominant. Suggest appropriate recollection if clinically indicated.   Report Status 11/19/2011 FINAL   Final   MRSA PCR SCREENING     Status: Normal   Collection Time   11/18/11  3:25 AM      Component Value Range Status Comment   MRSA by PCR NEGATIVE  NEGATIVE Final     Studies/Results: No results found.  Medications: Scheduled Meds:    . amLODipine  10 mg Oral QHS  . aspirin EC  81 mg Oral Daily  . atorvastatin  20 mg Oral Daily  . clopidogrel  75 mg Oral Daily  . docusate sodium  100 mg Oral BID  . heparin  5,000 Units Subcutaneous Q8H  . insulin aspart  0-9 Units Subcutaneous BID  . multivitamin with minerals  1 tablet Oral Daily  . polyethylene glycol  17 g Oral Daily  . sodium bicarbonate  1,300 mg Oral BID  . sodium chloride  250 mL Intravenous Once  . DISCONTD: cefTRIAXone (ROCEPHIN)  IV  1 g Intravenous Q24H   Continuous Infusions:  PRN Meds:.morphine injection, nitroGLYCERIN, ondansetron (ZOFRAN) IV, ondansetron, oxyCODONE-acetaminophen  Assessment/Plan:  Active Problems:  Hypertension  Dyslipidemia  DM (diabetes mellitus)  UTI (lower urinary tract infection)  Altered mental status  Fall   Fall -UTI presumed to have played a role in her fall. -Continue PT. -Plan currently is for SNF at [place].  UTI -Culture negative, but had already received antibiotics in the ED. -Today is day 5 of Rocephin. Will discontinue at this point.  Bilateral UE Pain and Weakness -MRI c-spine pending after discussion with neurology. Unable to complete yesterday 2/2 pain and anxiety. Will order ativan to be given 15 minutes prior  to procedure. -Will need a formal neurology/neurosurgery consult if any abnormalities on MRI. -Continue PT/OT.   Hyperlipidemia -Continue statin  Encephalopathy -Resolved -Presumed secondary to UTi and fall.   LOS: 6 days   [facility] Pager: [phone] 11/23/2011, 11:24 AM

## 2011-11-23 NOTE — ED Provider Notes (Signed)
I saw and evaluated the patient, reviewed the resident's note and I agree with the findings and plan.  78 year old female with confusion. Patient had a fall at home. She continues to remain confused. Not clear if this is a postconcussive syndrome or possibly as a result of a UTI. Patient continues to be confused and is normally very bright and lives with herself. Aside from mental status changes neurological exam is nonfocal. She is in no acute distress. Lungs were clear with good air movement. There is no increased work of breathing. Heart is regular. I cannot appreciate a murmur. Abdomen is soft and nontender. No distention. Will omit for further observation evaluation.  Raeford Razor, MD 11/23/11 [phone]

## 2011-11-23 NOTE — Progress Notes (Signed)
Occupational Therapy Treatment Patient Details Name: [name] MRN: [id number] DOB: 09-08-1928 Today's Date: 11/23/2011 Time: 6144-3154 OT Time Calculation (min): 16 min  OT Assessment / Plan / Recommendation Comments on Treatment Session This 78 yo female showing increased movement in RUE, but less movement in LUE with flattening of the palm.    Follow Up Recommendations  Inpatient Rehab (daughter in room says 24/7 can be provided)       Equipment Recommendations  Defer to next venue    Recommendations for Other Services Rehab consult  Frequency Min 3X/week   Plan Discharge plan needs to be updated    Precautions / Restrictions Precautions Precautions: Fall;Cervical Precaution Comments: Pt with hypersensitivity to touch (pain) left arm from elbow to hand Required Braces or Orthoses: Cervical Brace Cervical Brace: Soft collar Restrictions Weight Bearing Restrictions: No   Pertinent Vitals/Pain 15/ 10 RUE    ADL  Grooming: Performed;Wash/dry hands;Set up;Supervision/safety (with encouragement to use LUE) Where Assessed - Grooming: Unsupported sitting Toilet Transfer: Performed;Minimal assistance Toilet Transfer Method: Sit to stand Toilet Transfer Equipment: Bedside commode Toileting - Clothing Manipulation and Hygiene: Performed;Independent Where Assessed - Toileting Clothing Manipulation and Hygiene: Sit on 3-in-1 or toilet Equipment Used:  (soft c collar) Transfers/Ambulation Related to ADLs: Min A for transfers      OT Goals ADL Goals ADL Goal: Grooming - Progress: Progressing toward goals ADL Goal: Toilet Transfer - Progress: Progressing toward goals ADL Goal: Toileting - Hygiene - Progress: Progressing toward goals  Visit Information  Last OT Received On: 11/23/11 Assistance Needed: +1 PT/OT Co-Evaluation/Treatment: Yes (partial)    Subjective Data  Subjective: "My left arm is still really sensitive, I even dropped a kleenex on it earlier and it about  sent me through the roof"      Cognition  Overall Cognitive Status: Appears within functional limits for tasks assessed/performed Arousal/Alertness: Awake/alert Orientation Level: Appears intact for tasks assessed Behavior During Session: [facility] for tasks performed    Mobility  Shoulder Instructions Bed Mobility Bed Mobility: Supine to Sit;Sitting - Scoot to Edge of Bed Supine to Sit: 3: Mod assist;HOB flat Sitting - Scoot to Edge of Bed: 5: Supervision Sit to Supine: 4: Min assist Details for Bed Mobility Assistance: pt. keeping left arm/hand closely guarded to avoid touch and increased pain; needed mod to min assist for supine<>sit Transfers Transfers: Sit to Stand;Stand to Sit Sit to Stand: 4: Min assist;With upper extremity assist;From bed (RUE only) Stand to Sit: 4: Min assist;With upper extremity assist;With armrests;To chair/3-in-1 (RUE only)       Exercises  Other Exercises Other Exercises: Pt showing increased movement of RUE, not seeing jerkiness today, and 3/5 hand grip today. Other Exercises: LUE remains the more affected UE with decreased use distally, increased effort throughout to try and move it, and extremely hypersenstive to touch (painful)   Balance Balance Balance Assessed: Yes Static Sitting Balance Static Sitting - Balance Support: No upper extremity supported;Feet supported Static Sitting - Level of Assistance: 5: Stand by assistance Dynamic Sitting Balance Dynamic Sitting - Balance Support: No upper extremity supported;Feet supported Dynamic Sitting - Level of Assistance: 5: Stand by assistance Dynamic Sitting - Balance Activities: Lateral lean/weight shifting;Forward lean/weight shifting;Reaching for objects   End of Session OT - End of Session Equipment Utilized During Treatment: Cervical collar Activity Tolerance:  (tx limited due to pt needed to get meds for MRO) Patient left: in bed;with call bell/phone within reach;with nursing in room;with  family/visitor present (daughter)  Evette Georges [phone] 11/23/2011, 2:17 PM

## 2011-11-23 NOTE — Progress Notes (Signed)
Pt again unable to complete MRI C-spine, even with Ativan 2mg  administered 15 mins prior to test. MRI stated the pt kept saying they were trying to break her neck, that they tried several time to reposition pt to make more comfortable, but pt eventually refused to complete test. Pt says she does not remember any of this. Dr Ardyth Harps made aware. [name], [name] Randy

## 2011-11-23 NOTE — Progress Notes (Signed)
Pt to attempt MRI C-spine for the third time tomorrow. Coordinated with Dr. Ardyth Harps, MRI, and pt's daughter for pt to go to MRI at 0830 AM tomorrow to retry MRI of C-spine. Okayed with MRI for pt daughter to accompany her to help ease pt during test. [place], Rosanna Randy

## 2011-11-24 ENCOUNTER — Inpatient Hospital Stay (HOSPITAL_COMMUNITY): Payer: Medicare Other

## 2011-11-24 DIAGNOSIS — R29898 Other symptoms and signs involving the musculoskeletal system: Secondary | ICD-10-CM

## 2011-11-24 LAB — GLUCOSE, CAPILLARY
Glucose-Capillary: 236 mg/dL — ABNORMAL HIGH (ref 70–99)
Glucose-Capillary: 217 mg/dL — ABNORMAL HIGH (ref 70–99)

## 2011-11-24 MED ORDER — GADOBENATE DIMEGLUMINE 529 MG/ML IV SOLN
15.0000 mL | Freq: Once | INTRAVENOUS | Status: AC | PRN
  Administered 2011-11-24: 15 mL via INTRAVENOUS

## 2011-11-24 MED ORDER — MORPHINE SULFATE 2 MG/ML IJ SOLN
1.0000 mg | Freq: Once | INTRAMUSCULAR | Status: AC
  Administered 2011-11-24: 1 mg via INTRAVENOUS

## 2011-11-24 MED ORDER — LORAZEPAM 0.5 MG PO TABS
0.2500 mg | ORAL_TABLET | Freq: Three times a day (TID) | ORAL | Status: DC | PRN
  Administered 2011-11-25 – 2011-12-02 (×8): 0.25 mg via ORAL
  Filled 2011-11-24 (×8): qty 1

## 2011-11-24 MED ORDER — MORPHINE SULFATE 2 MG/ML IJ SOLN: 1.0000 mg | INTRAMUSCULAR | Status: DC | PRN

## 2011-11-24 MED ORDER — MORPHINE SULFATE 2 MG/ML IJ SOLN
2.0000 mg | INTRAMUSCULAR | Status: DC | PRN
  Administered 2011-11-25 – 2011-12-03 (×22): 2 mg via INTRAVENOUS
  Filled 2011-11-24 (×25): qty 1

## 2011-11-24 NOTE — Progress Notes (Signed)
Physical Therapy Treatment Patient Details Name: [name] MRN: [id number] DOB: 1929/01/05 Today's Date: 11/24/2011 Time: 5621-3086 PT Time Calculation (min): 23 min  PT Assessment / Plan / Recommendation Comments on Treatment Session  Pt. is awaiting MRI but reports that "machine is broke".  The pain and hypersensitivity persists in left UE and left extremities remain weaker than right.  If she Dc's home, will need 24 hour care and 3n1, RW.    Follow Up Recommendations  Inpatient Rehab    Barriers to Discharge        Equipment Recommendations  Defer to next venue;Other (comment) (for home DC, will need 3n1 and RW, HHPT intitially then OPPT)    Recommendations for Other Services    Frequency Min 3X/week   Plan Discharge plan remains appropriate    Precautions / Restrictions Precautions Precautions: Fall;Cervical Precaution Comments: Pt with hypersensitivity to touch (pain) left arm from elbow to hand Required Braces or Orthoses: Cervical Brace Cervical Brace: Soft collar Restrictions Weight Bearing Restrictions: No   Pertinent Vitals/Pain Pain "15/10" in left arm; RN aware, pt repositioned for comfort    Mobility  Bed Mobility Bed Mobility: Supine to Sit;Sitting - Scoot to Edge of Bed Supine to Sit: HOB elevated;With rails;4: Min guard Sitting - Scoot to [place] of Bed: 5: Supervision Sit to Supine: 4: Min guard;HOB elevated Details for Bed Mobility Assistance: managing own bed mobility without physical assist today, cues for safe technique Transfers Transfers: Sit to Stand;Stand to Sit;Stand Pivot Transfers Sit to Stand: 4: Min assist;With upper extremity assist;From bed Stand to Sit: 4: Min assist;With upper extremity assist;With armrests;To chair/3-in-1 Stand Pivot Transfers: 4: Min assist Details for Transfer Assistance: Min assist support for balacne and stability during transfer. Pt.   still heavily guarding left UE and not using it during task, keeping it flexed  anc close to her mid chest. Ambulation/Gait Ambulation/Gait Assistance: Not tested (comment)    Exercises Other Exercises Other Exercises: Comparison of right and left extremities today reveal pt. with slight improvement in left extremities today but still with significant weakness and decreased coordination compared to right.   PT Diagnosis:    PT Problem List:   PT Treatment Interventions:     PT Goals Acute Rehab PT Goals PT Goal: Supine/Side to Sit - Progress: Progressing toward goal PT Goal: Sit to Supine/Side - Progress: Progressing toward goal PT Goal: Sit to Stand - Progress: Progressing toward goal PT Goal: Stand to Sit - Progress: Progressing toward goal  Visit Information  Last PT Received On: 11/24/11 Assistance Needed: +1    Subjective Data  Subjective: still painful in left arm   Cognition  Overall Cognitive Status: Appears within functional limits for tasks assessed/performed Arousal/Alertness: Awake/alert Orientation Level: Appears intact for tasks assessed Behavior During Session: [facility] for tasks performed    Balance  Static Sitting Balance Static Sitting - Balance Support: No upper extremity supported;Feet supported Static Sitting - Level of Assistance: 6: Modified independent (Device/Increase time) Dynamic Sitting Balance Dynamic Sitting - Level of Assistance: 5: Stand by assistance  End of Session PT - End of Session Equipment Utilized During Treatment: Gait belt Activity Tolerance: Patient tolerated treatment well Patient left: in bed;with call bell/phone within reach;with family/visitor present Nurse Communication: Mobility status   GP     Ferman Hamming 11/24/2011, 2:00 PM Weldon Picking PT Acute Rehab Services [phone] [place] [phone]

## 2011-11-24 NOTE — Progress Notes (Signed)
MRI to call when ready for pt. MRI states they will be able to take pt within the next hr. [place], Rosanna Randy

## 2011-11-24 NOTE — Progress Notes (Addendum)
MRI machine broken per MRI. Still have STAT orders to go before they take pt. Informed MRI again of need to call prior to transport so meds can be given before test. MD aware. [place], Rosanna Randy

## 2011-11-24 NOTE — Progress Notes (Addendum)
[facility]             Progress Note   Subjective: Complains of significant pain and weakness to bilateral upper extremities L>R. Daughter Rosey Bath present and updated on plan of care.  Objective: Vital signs in last 24 hours: Temp:  [98.3 F (36.8 C)-99.2 F (37.3 C)] 98.8 F (37.1 C) (09/13 0948) Pulse Rate:  [71-78] 77  (09/13 0948) Resp:  [18] 18  (09/13 0948) BP: (121-159)/(75-82) 145/79 mmHg (09/13 0948) SpO2:  [91 %-96 %] 96 % (09/13 0948) Weight:  [64.184 kg (141 lb 8 oz)] 64.184 kg (141 lb 8 oz) (09/12 2052) Weight change: -0.216 kg (-7.6 oz) Last BM Date: 11/23/11  Intake/Output from previous day: 09/12 0701 - 09/13 0700 In: 494 [P.O.:490; IV Piggyback:4] Out: 100 [Urine:100] Total I/O In: 120 [P.O.:120] Out: 500 [Urine:500]   Physical Exam: General: Alert, awake, oriented x3. HEENT: No bruits, no goiter. Heart: Regular rate and rhythm, has a SEM. Lungs: Clear to auscultation bilaterally. Abdomen: Soft, nontender, nondistended, positive bowel sounds. Extremities: No clubbing cyanosis or edema with positive pedal pulses. No upper extremity deformities noted.    Lab Results: Basic Metabolic Panel:  Basename 11/23/11 0650 11/22/11 0620  NA 137 138  K 4.4 4.0  CL 99 102  CO2 26 28  GLUCOSE 160* 182*  BUN 20 20  CREATININE 0.95 1.04  CALCIUM 9.5 9.8  MG -- --  PHOS -- --   CBC:  Basename 11/23/11 0650 11/22/11 0620  WBC 6.1 6.8  NEUTROABS -- --  HGB 11.1* 11.1*  HCT 33.5* 33.8*  MCV 90.3 90.4  PLT 202 215   CBG:  Basename 11/24/11 0907 11/23/11 2216 11/23/11 0739 11/22/11 2229 11/22/11 0949 11/22/11 0732  GLUCAP 359* 187* 189* 173* 216* 172*   Urine Drug Screen: Drugs of Abuse     Component Value Date/Time   LABOPIA  Value: POSITIVE (NOTE) Result repeated and verified. Sent for confirmatory testing* 09/06/2009 0037   COCAINSCRNUR NEGATIVE 09/06/2009 0037   LABBENZ NEGATIVE 09/06/2009 0037   AMPHETMU NEGATIVE 09/06/2009 0037       Recent Results (from the past 240 hour(s))  URINE CULTURE     Status: Normal   Collection Time   11/17/11 11:34 PM      Component Value Range Status Comment   Specimen Description URINE, RANDOM   Final    Special Requests NONE   Final    Culture  Setup Time 11/18/2011 11:07   Final    Colony Count 30,000 COLONIES/ML   Final    Culture     Final    Value: Multiple bacterial morphotypes present, none predominant. Suggest appropriate recollection if clinically indicated.   Report Status 11/19/2011 FINAL   Final   MRSA PCR SCREENING     Status: Normal   Collection Time   11/18/11  3:25 AM      Component Value Range Status Comment   MRSA by PCR NEGATIVE  NEGATIVE Final     Studies/Results: Dg Hand 2 View Left  11/23/2011  *RADIOLOGY REPORT*  Clinical Data: Left hand pain.  Fall.  LEFT HAND - 2 VIEW  Comparison: None.  Findings: No acute fracture and no dislocation.  Mild degenerative change of the IP joints.  IMPRESSION: No acute bony pathology.   Original Report Authenticated By: Donavan Burnet, M.D.     Medications: Scheduled Meds:    . amLODipine  10 mg Oral QHS  . aspirin EC  81 mg Oral Daily  .  atorvastatin  20 mg Oral Daily  . clopidogrel  75 mg Oral Daily  . docusate sodium  100 mg Oral BID  . feeding supplement  237 mL Oral BID BM  . feeding supplement  1 Container Oral BID WC  . heparin  5,000 Units Subcutaneous Q8H  . insulin aspart  0-9 Units Subcutaneous BID  . LORazepam  2 mg Intravenous Once  . multivitamin with minerals  1 tablet Oral Daily  . polyethylene glycol  17 g Oral Daily  . sodium bicarbonate  1,300 mg Oral BID  . sodium chloride  250 mL Intravenous Once   Continuous Infusions:  PRN Meds:.morphine injection, nitroGLYCERIN, ondansetron (ZOFRAN) IV, ondansetron, oxyCODONE-acetaminophen  Assessment/Plan:  Active Problems:  Hypertension  Dyslipidemia  DM (diabetes mellitus)  UTI (lower urinary tract infection)  Altered mental status   Fall   Fall -UTI presumed to have played a role in her fall. -Continue PT. -Plan currently is for SNF at DC. -Patient is now refusing SNF, so we will need to arrange for [facility] care.  UTI -Culture negative, but had already received antibiotics in the ED. -Today is day 5 of Rocephin. Will discontinue at this point.  Bilateral UE Pain and Weakness -Concerned for cervical spinal cord disease. -MRI c-spine pending after discussion with neurology. Unable to complete yesterday 2/2 pain and anxiety. Will order ativan to be given 15 minutes prior to procedure. -Daughter requested left hand xray which was normal. -Will need a formal neurology/neurosurgery consult if any abnormalities on MRI. -Continue PT/OT.   Hyperlipidemia -Continue statin  Encephalopathy -Resolved -Presumed secondary to UTi and fall.  Disposition -Waiting on MRI results: May need neurosurgery to see prior to D/C home.   LOS: 7 days   [facility] Pager: [phone] 11/24/2011, 12:54 PM

## 2011-11-24 NOTE — Clinical Social Work Note (Signed)
CSW informed by attending MD that she talked with patient and she has decided to go home. One of patient's daughters present and she is in agreement.  Patient will d/c home today.  CSW signing off as no other social work needs at this time.  Please re-consult if needed.  Genelle Bal, MSW, LCSW [phone]

## 2011-11-24 NOTE — Progress Notes (Signed)
MRI still back up and unable to take pt at this time. Will check back with MRI later. MD aware of situation. [place], Rosanna Randy

## 2011-11-24 NOTE — Progress Notes (Addendum)
Called MRI to check when pt will go for test. Test scheduled for 0830 this AM. MRI stated they just received 4 STAT orders and are unable to take the pt at the time time. Informed them to call 15 mins prior to transport so medication can be given. Daughter here to accompany pt when she goes. Will continue to check with MRI on when pt will go. [place], Rosanna Randy

## 2011-11-25 ENCOUNTER — Encounter (HOSPITAL_COMMUNITY): Payer: Self-pay | Admitting: Neurological Surgery

## 2011-11-25 DIAGNOSIS — R29898 Other symptoms and signs involving the musculoskeletal system: Secondary | ICD-10-CM

## 2011-11-25 LAB — GLUCOSE, CAPILLARY: Glucose-Capillary: 339 mg/dL — ABNORMAL HIGH (ref 70–99)

## 2011-11-25 MED ORDER — INFLUENZA VIRUS VACC SPLIT PF IM SUSP
0.5000 mL | INTRAMUSCULAR | Status: AC
  Administered 2011-11-26: 0.5 mL via INTRAMUSCULAR
  Filled 2011-11-25 (×2): qty 0.5

## 2011-11-25 NOTE — Progress Notes (Addendum)
[facility]             Progress Note   Subjective: Complains of significant pain and weakness to bilateral upper extremities L>R. Daughter Synetta Fail present and updated on plan of care. They have already spoken with Dr. Yetta Barre (neurosurgery) who is advising on OR on Thursday to allow for plavix washout.  Objective: Vital signs in last 24 hours: Temp:  [98.5 F (36.9 C)-99.1 F (37.3 C)] 99.1 F (37.3 C) (09/14 0934) Pulse Rate:  [77-85] 85  (09/14 0934) Resp:  [18] 18  (09/14 0934) BP: (114-156)/(72-85) 156/85 mmHg (09/14 0934) SpO2:  [93 %-95 %] 93 % (09/14 0934) Weight:  [65 kg (143 lb 4.8 oz)] 65 kg (143 lb 4.8 oz) (09/13 2201) Weight change: 0.816 kg (1 lb 12.8 oz) Last BM Date: 11/23/11  Intake/Output from previous day: 09/13 0701 - 09/14 0700 In: 360 [P.O.:360] Out: 500 [Urine:500]     Physical Exam: General: Alert, awake, oriented x3. HEENT: No bruits, no goiter. Heart: Regular rate and rhythm, has a SEM. Lungs: Clear to auscultation bilaterally. Abdomen: Soft, nontender, nondistended, positive bowel sounds. Extremities: No clubbing cyanosis or edema with positive pedal pulses. No upper extremity deformities noted.    Lab Results: Basic Metabolic Panel:  [facility] 11/23/11 0650  NA 137  K 4.4  CL 99  CO2 26  GLUCOSE 160*  BUN 20  CREATININE 0.95  CALCIUM 9.5  MG --  PHOS --   CBC:  Basename 11/23/11 0650  WBC 6.1  NEUTROABS --  HGB 11.1*  HCT 33.5*  MCV 90.3  PLT 202   CBG:  Basename 11/25/11 1058 11/24/11 2308 11/24/11 2157 11/24/11 0907 11/23/11 2216 11/23/11 0739  GLUCAP 339* 217* 236* 359* 187* 189*   Urine Drug Screen: Drugs of Abuse     Component Value Date/Time   LABOPIA  Value: POSITIVE (NOTE) Result repeated and verified. Sent for confirmatory testing* 09/06/2009 0037   COCAINSCRNUR NEGATIVE 09/06/2009 0037   LABBENZ NEGATIVE 09/06/2009 0037   AMPHETMU NEGATIVE 09/06/2009 0037     Recent Results (from the past 240  hour(s))  URINE CULTURE     Status: Normal   Collection Time   11/17/11 11:34 PM      Component Value Range Status Comment   Specimen Description URINE, RANDOM   Final    Special Requests NONE   Final    Culture  Setup Time 11/18/2011 11:07   Final    Colony Count 30,000 COLONIES/ML   Final    Culture     Final    Value: Multiple bacterial morphotypes present, none predominant. Suggest appropriate recollection if clinically indicated.   Report Status 11/19/2011 FINAL   Final   MRSA PCR SCREENING     Status: Normal   Collection Time   11/18/11  3:25 AM      Component Value Range Status Comment   MRSA by PCR NEGATIVE  NEGATIVE Final     Studies/Results: Mr Cervical Spine W Wo Contrast  11/24/2011  *RADIOLOGY REPORT*  Clinical Data: Fall.  Paresthesias.  MRI CERVICAL SPINE WITHOUT AND WITH CONTRAST  Technique:  Multiplanar and multiecho pulse sequences of the cervical spine, to include the craniocervical junction and cervicothoracic junction, were obtained according to standard protocol without and with intravenous contrast.  Contrast: 15mL MULTIHANCE GADOBENATE DIMEGLUMINE 529 MG/ML IV SOLN  Comparison: CT 11/17/2011.  MRI 09/10/2009.  Findings: The foramen magnum is widely patent.  C1-2 and C2-3 are unremarkable except for mild facet degeneration.  C3-4 shows mild bulging of the disc and mild facet degeneration but no significant stenosis.  C4-5 shows mild spondylosis and facet degeneration but no significant stenosis.  There is mild foraminal narrowing on the right.  At C5-6, there is evidence of acute injury with interspinous edema and some edema of the facet joints.  There is mild prevertebral edema.  There are endplate osteophytes and chronic protrusion of disc material.  The canal is narrowed with an AP diameter of only 6 mm.  There is mild cord edema.  No evidence of cord hemorrhage.  No epidural hematoma.  No definable fracture.  At C6-7, there is spondylosis with endplate osteophytes and  bulging of the disc.  No significant canal or foraminal narrowing. There is mild interspinous edema.  At C7-T1, there is mild facet degeneration but no significant stenosis.  The upper thoracic region is unremarkable.  IMPRESSION: Findings most consistent with a hyperflexion injury.  Interspinous edema at the C5-6 level and to a lesser extent at the C6-7 level. No definable fracture or subluxation.  Spondylosis at C5-6 with canal stenosis, AP diameter being only 6 mm.  There is low-level abnormal cord edema consistent with recent cord injury.  No evidence of cord hemorrhage or epidural hematoma.  There is mild prevertebral edema.   Original Report Authenticated By: Thomasenia Sales, M.D.     Medications: Scheduled Meds:    . amLODipine  10 mg Oral QHS  . atorvastatin  20 mg Oral Daily  . docusate sodium  100 mg Oral BID  . feeding supplement  237 mL Oral BID BM  . feeding supplement  1 Container Oral BID WC  . insulin aspart  0-9 Units Subcutaneous BID  . LORazepam  2 mg Intravenous Once  .  morphine injection  1 mg Intravenous Once  . multivitamin with minerals  1 tablet Oral Daily  . polyethylene glycol  17 g Oral Daily  . sodium bicarbonate  1,300 mg Oral BID  . sodium chloride  250 mL Intravenous Once  . DISCONTD: aspirin EC  81 mg Oral Daily  . DISCONTD: clopidogrel  75 mg Oral Daily  . DISCONTD: heparin  5,000 Units Subcutaneous Q8H   Continuous Infusions:  PRN Meds:.gadobenate dimeglumine, LORazepam, morphine injection, nitroGLYCERIN, ondansetron (ZOFRAN) IV, ondansetron, oxyCODONE-acetaminophen, DISCONTD:  morphine injection, DISCONTD:  morphine injection  Assessment/Plan:  Principal Problem:  *Upper extremity weakness Active Problems:  Hypertension  Dyslipidemia  DM (diabetes mellitus)  UTI (lower urinary tract infection)  Altered mental status  Fall   Fall -UTI presumed to have played a role in her fall. -Continue PT. -Patient is now refusing SNF, so we will need to  arrange for [facility] care.  UTI -Culture negative, but had already received antibiotics in the ED. -Has completed treatment with rocephin.  Bilateral UE Pain and Weakness -Results of MRI reviewed. -Case discussed with neurosurgery, Dr. Yetta Barre. -Plan for OR on Thursday for ACDF at the C5-6 level to allow for plavix washout.   Hyperlipidemia -Continue statin  Encephalopathy -Resolved -Presumed secondary to UTI.  Disposition -Surgery on Thursday to address c-spine MRI findings. -She is requiring large amounts of IV pain medication for L>R hypersensitivity 2/2 c-spine MRI findings which will difficult DC home while we wait for surgery.  Time spent discussing findings of MRI with patient, family and discussing case with consultant: 40 minutes   LOS: 8 days   [facility] Pager: [phone] 11/25/2011, 12:54 PM

## 2011-11-25 NOTE — Consult Note (Signed)
Reason for Consult: C5-6 stenosis Referring Physician: Hospitalist  [name] is an 78 y.o. female.   HPI:  78 year old female who reportedly fell on Friday. She does not recall the event or the cause of the fall. She does not recall if she lost consciousness. She was admitted and was quite confused according to the hospitalist. I found her to have a UTI, and once treated her confusion cleared. She then started to complain of significant neck pain with arm pain left greater than right. She describes it as a burning type pain and it is tender to the touch. She denies numbness and tingling. She does describe some trouble using her hands. MRI revealed an acute injury to C5-6 with stenosis, signal change in the posterior ligamentous structures, and mild signal change in the cord. Neurosurgical by which was requested. She is on aspirin, Plavix and subcutaneous heparin.  Past Medical History  Diagnosis Date  . Shortness of breath   . CAD (coronary artery disease)     Last catheterization 2009, grafts patent, EF 70%, 80% small OM with possible mild ischemic  . Hx of CABG     2000, LIMA to LAD, SVG to diagonal, SVG to OM, SVG to posterior descending  . Ejection fraction     EF 70%,, 2009 /  EF 65%, echo, October, 2011  . Hypertension   . Palpitations   . Dyslipidemia   . Diabetes mellitus   . Pancreatitis   . Carotid artery disease     Mild, Doppler, 2000  . Dizziness     Evaluated April, 2008, no treatment needed  . Cervical spine disease     Severe neck pain, 2011  . Hyperkalemia     Her doctor Zachery Dauer, September, 2012, ACE inhibitor stopped, probable amlodipine to be  . Shortness of breath   . History of hysterectomy   . Chest pain     Chest hurting, May, 2013    Past Surgical History  Procedure Date  . Coronary artery bypass graft 2000  . Cardiac catheterization 2009  . Cholecystectomy     Allergies  Allergen Reactions  . Codeine     Unknown    History  Substance Use  Topics  . Smoking status: Never Smoker   . Smokeless tobacco: Not on file   Comment: quit in the 1970's  . Alcohol Use: No    Family History  Problem Relation Age of Onset  . Cancer Mother   . Heart failure Father   . Coronary artery disease Father   . Cancer      siblings  . Heart failure      siblings     Review of Systems  Positive ROS: neg  All other systems have been reviewed and were otherwise negative with the exception of those mentioned in the HPI and as above.  Objective: Vital signs in last 24 hours: Temp:  [98.5 F (36.9 C)-99 F (37.2 C)] 98.8 F (37.1 C) (09/14 0443) Pulse Rate:  [77-78] 77  (09/14 0443) Resp:  [18] 18  (09/14 0443) BP: (114-145)/(72-80) 114/72 mmHg (09/14 0443) SpO2:  [94 %-96 %] 95 % (09/14 0443) Weight:  [65 kg (143 lb 4.8 oz)] 65 kg (143 lb 4.8 oz) (09/13 2201)  General Appearance: Alert, cooperative, no distress, appears stated age Head: Normocephalic, without obvious abnormality Eyes: PERRL      Throat: benign Neck: In soft collar Extremities: Extremities normal, atraumatic, no cyanosis or edema Pulses: 2+ and symmetric all extremities  NEUROLOGIC:   Mental status: A&O x4, no aphasia, good attention span, Memory and fund of knowledge except for some amnesia for the event Motor Exam - handgrips are weak left greater than right measuring about 4 or 5 on the right and 4 minus out of 5 on the left, she also has some decreased wrist extension on the left and her biceps are probably 4-/5, seems to move her lower shoulder is fairly well Sensory Exam - dysesthetic pain in the upper extremities left greater than right, quite tender to touch Reflexes: symmetric but hypoactive, no pathologic reflexes, No Hoffman's at this point Coordination - poor Gait - not tested Balance - not tested Cranial Nerves: I: smell Not tested  II: visual acuity  OS: na    OD: na  II: visual fields Full to confrontation  II: pupils Equal, round, reactive  to light  III,VII: ptosis None  III,IV,VI: extraocular muscles  Full ROM  V: mastication Normal  V: facial light touch sensation  Normal  V,VII: corneal reflex  Present  VII: facial muscle function - upper  Normal  VII: facial muscle function - lower Normal  VIII: hearing Not tested  IX: soft palate elevation  Normal  IX,X: gag reflex Present  XI: trapezius strength  5/5  XI: sternocleidomastoid strength 5/5  XI: neck flexion strength  5/5  XII: tongue strength  Normal    Data Review Lab Results  Component Value Date   WBC 6.1 11/23/2011   HGB 11.1* 11/23/2011   HCT 33.5* 11/23/2011   MCV 90.3 11/23/2011   PLT 202 11/23/2011   Lab Results  Component Value Date   NA 137 11/23/2011   K 4.4 11/23/2011   CL 99 11/23/2011   CO2 26 11/23/2011   BUN 20 11/23/2011   CREATININE 0.95 11/23/2011   GLUCOSE 160* 11/23/2011   Lab Results  Component Value Date   INR 0.96 11/17/2011    Radiology: Ct Head Wo Contrast  11/17/2011  *RADIOLOGY REPORT*  Clinical Data:  Fall.  Head injury.  Frontal scalp hematoma and headache.  Epistaxis.  Neck pain.  CT HEAD WITHOUT CONTRAST CT CERVICAL SPINE WITHOUT CONTRAST  Technique:  Multidetector CT imaging of the head and cervical spine was performed following the standard protocol without intravenous contrast.  Multiplanar CT image reconstructions of the cervical spine were also generated.  Comparison:  Head CT on 09/05/2009  CT HEAD  Findings: There is no evidence of intracranial hemorrhage, brain edema or other signs of acute infarction.  There is no evidence of intracranial mass lesion or mass effect.  No abnormal extra-axial fluid collections are identified.  Mild cerebral atrophy and chronic small vessel disease are unchanged in appearance.  Ventricles remain stable in size.  No evidence of skull fracture.  IMPRESSION:  1.  No acute intracranial abnormality. 2.  Stable mild cerebral atrophy and chronic small vessel disease.  CT CERVICAL SPINE  Findings: No  evidence of cervical spine fracture or subluxation.  Mild to moderate degenerative disc disease is seen from levels of C4 to C7.  Moderate facet DJD is also seen bilaterally, left side greater than right.  IMPRESSION:  1. Negative for acute cervical spine fracture or subluxation. 2.  Moderate cervical spondylosis, as described above.   Original Report Authenticated By: Danae Orleans, M.D.    Ct Cervical Spine Wo Contrast  11/17/2011  *RADIOLOGY REPORT*  Clinical Data:  Fall.  Head injury.  Frontal scalp hematoma and headache.  Epistaxis.  Neck  pain.  CT HEAD WITHOUT CONTRAST CT CERVICAL SPINE WITHOUT CONTRAST  Technique:  Multidetector CT imaging of the head and cervical spine was performed following the standard protocol without intravenous contrast.  Multiplanar CT image reconstructions of the cervical spine were also generated.  Comparison:  Head CT on 09/05/2009  CT HEAD  Findings: There is no evidence of intracranial hemorrhage, brain edema or other signs of acute infarction.  There is no evidence of intracranial mass lesion or mass effect.  No abnormal extra-axial fluid collections are identified.  Mild cerebral atrophy and chronic small vessel disease are unchanged in appearance.  Ventricles remain stable in size.  No evidence of skull fracture.  IMPRESSION:  1.  No acute intracranial abnormality. 2.  Stable mild cerebral atrophy and chronic small vessel disease.  CT CERVICAL SPINE  Findings: No evidence of cervical spine fracture or subluxation.  Mild to moderate degenerative disc disease is seen from levels of C4 to C7.  Moderate facet DJD is also seen bilaterally, left side greater than right.  IMPRESSION:  1. Negative for acute cervical spine fracture or subluxation. 2.  Moderate cervical spondylosis, as described above.   Original Report Authenticated By: Danae Orleans, M.D.    Dg Chest Port 1 View  11/18/2011  *RADIOLOGY REPORT*  Clinical Data: Altered mental status.  Fall.  Pain.  PORTABLE CHEST -  1 VIEW  Comparison: 12/07/2009  Findings: Stable appearance of postoperative changes in the mediastinum.  Shallow inspiration. The heart size and pulmonary vascularity are normal. The lungs appear clear and expanded without focal air space disease or consolidation. No blunting of the costophrenic angles.  No pneumothorax.  Mediastinal contours appear intact.  Degenerative changes in the spine and shoulders.  No significant change since previous study.  IMPRESSION: Shallow inspiration.  No evidence of active pulmonary disease.   Original Report Authenticated By: Marlon Pel, M.D.    MRI: Shows signal change in the posterior ligamentous structures suggestive of a flexion type injury, just spondylosis at C5-6 with some stenosis with signal change in the cord just of a central cord type injury, and there is some abnormal signal within the disc at C5-6  Assessment/Plan: It appears she has an acute injury at C5-6 with some ligamentous injury, spondylosis with stenosis and some evidence of a central cord type injury both clinically and radiologically. I believe she needs a decompression at C5-6 in the form of ACDF with plating. I would do this tomorrow except for the fact that she is on aspirin Plavix and heparin. I have discontinued these in hopes of being able to do her surgery on Thursday morning. This would decompress her canal and stabilize this segment. I have yet to be upper discussed this with her daughters but will do so when they arrive.   Dary Dilauro S 11/25/2011 9:19 AM

## 2011-11-26 MED ORDER — INSULIN GLARGINE 100 UNIT/ML ~~LOC~~ SOLN
5.0000 [IU] | Freq: Every day | SUBCUTANEOUS | Status: DC
  Administered 2011-11-26: 5 [IU] via SUBCUTANEOUS

## 2011-11-26 MED ORDER — GABAPENTIN 300 MG PO CAPS
300.0000 mg | ORAL_CAPSULE | Freq: Three times a day (TID) | ORAL | Status: DC
  Administered 2011-11-26 – 2011-12-06 (×31): 300 mg via ORAL
  Filled 2011-11-26 (×35): qty 1

## 2011-11-26 NOTE — Progress Notes (Signed)
Patient ID: [name], female   DOB: Apr 23, 1928, 78 y.o.   MRN: [id number] Subjective: Patient reports she's doing about the same. Still complains of a burning and aching left arm pain.  Objective: Vital signs in last 24 hours: Temp:  [97.3 F (36.3 C)-99.1 F (37.3 C)] 98.1 F (36.7 C) (09/15 0453) Pulse Rate:  [73-85] 74  (09/15 0453) Resp:  [16-20] 18  (09/15 0453) BP: (133-156)/(72-85) 133/72 mmHg (09/15 0453) SpO2:  [93 %-96 %] 94 % (09/15 0453) Weight:  [63.504 kg (140 lb)] 63.504 kg (140 lb) (09/14 2206)  Intake/Output from previous day: 09/14 0701 - 09/15 0700 In: 120 [P.O.:120] Out: 425 [Urine:425] Intake/Output this shift: Total I/O In: -  Out: 100 [Urine:100]  Neurologic: Motor: Still somewhat weak in the left upper extremity more than the right upper extremity and she is tender to palpation along the arm  Lab Results: Lab Results  Component Value Date   WBC 6.1 11/23/2011   HGB 11.1* 11/23/2011   HCT 33.5* 11/23/2011   MCV 90.3 11/23/2011   PLT 202 11/23/2011   Lab Results  Component Value Date   INR 0.96 11/17/2011   BMET Lab Results  Component Value Date   NA 137 11/23/2011   K 4.4 11/23/2011   CL 99 11/23/2011   CO2 26 11/23/2011   GLUCOSE 160* 11/23/2011   BUN 20 11/23/2011   CREATININE 0.95 11/23/2011   CALCIUM 9.5 11/23/2011    Studies/Results: Mr Cervical Spine W Wo Contrast  11/24/2011  *RADIOLOGY REPORT*  Clinical Data: Fall.  Paresthesias.  MRI CERVICAL SPINE WITHOUT AND WITH CONTRAST  Technique:  Multiplanar and multiecho pulse sequences of the cervical spine, to include the craniocervical junction and cervicothoracic junction, were obtained according to standard protocol without and with intravenous contrast.  Contrast: 15mL MULTIHANCE GADOBENATE DIMEGLUMINE 529 MG/ML IV SOLN  Comparison: CT 11/17/2011.  MRI 09/10/2009.  Findings: The foramen magnum is widely patent.  C1-2 and C2-3 are unremarkable except for mild facet degeneration.  C3-4 shows mild  bulging of the disc and mild facet degeneration but no significant stenosis.  C4-5 shows mild spondylosis and facet degeneration but no significant stenosis.  There is mild foraminal narrowing on the right.  At C5-6, there is evidence of acute injury with interspinous edema and some edema of the facet joints.  There is mild prevertebral edema.  There are endplate osteophytes and chronic protrusion of disc material.  The canal is narrowed with an AP diameter of only 6 mm.  There is mild cord edema.  No evidence of cord hemorrhage.  No epidural hematoma.  No definable fracture.  At C6-7, there is spondylosis with endplate osteophytes and bulging of the disc.  No significant canal or foraminal narrowing. There is mild interspinous edema.  At C7-T1, there is mild facet degeneration but no significant stenosis.  The upper thoracic region is unremarkable.  IMPRESSION: Findings most consistent with a hyperflexion injury.  Interspinous edema at the C5-6 level and to a lesser extent at the C6-7 level. No definable fracture or subluxation.  Spondylosis at C5-6 with canal stenosis, AP diameter being only 6 mm.  There is low-level abnormal cord edema consistent with recent cord injury.  No evidence of cord hemorrhage or epidural hematoma.  There is mild prevertebral edema.   Original Report Authenticated By: Thomasenia Sales, M.D.     Assessment/Plan: Cervical spondylosis with stenosis and central cord type picture with some weakness and dysesthetic pain. We'll start low dose Neurontin.  Plan ACDF with plating at C5-6 on Thursday. Please do not restart aspirin or Plavix or heparin.   LOS: 9 days    JONES,DAVID S 11/26/2011, 6:47 AM

## 2011-11-26 NOTE — Progress Notes (Signed)
[facility]             Progress Note   Subjective: Still cmplains of significant pain and weakness to bilateral upper extremities L>R.  No family at bedside this morning.  Objective: Vital signs in last 24 hours: Temp:  [97.3 F (36.3 C)-98.4 F (36.9 C)] 98.4 F (36.9 C) (09/15 0922) Pulse Rate:  [73-80] 79  (09/15 0922) Resp:  [16-20] 19  (09/15 0922) BP: (133-155)/(72-82) 133/78 mmHg (09/15 0922) SpO2:  [93 %-96 %] 93 % (09/15 0922) Weight:  [63.504 kg (140 lb)] 63.504 kg (140 lb) (09/14 2206) Weight change: -1.496 kg (-3 lb 4.8 oz) Last BM Date: 11/23/11  Intake/Output from previous day: 09/14 0701 - 09/15 0700 In: 120 [P.O.:120] Out: 425 [Urine:425] Total I/O In: -  Out: 550 [Urine:550]   Physical Exam: General: Alert, awake, oriented x3. HEENT: No bruits, no goiter. Heart: Regular rate and rhythm, has a SEM. Lungs: Clear to auscultation bilaterally. Abdomen: Soft, nontender, nondistended, positive bowel sounds. Extremities: No clubbing cyanosis or edema with positive pedal pulses. No upper extremity deformities noted.    Lab Results: CBG:  Basename 11/25/11 2204 11/25/11 1058 11/24/11 2308 11/24/11 2157 11/24/11 0907 11/23/11 2216  GLUCAP 257* 339* 217* 236* 359* 187*   Urine Drug Screen: Drugs of Abuse     Component Value Date/Time   LABOPIA  Value: POSITIVE (NOTE) Result repeated and verified. Sent for confirmatory testing* 09/06/2009 0037   COCAINSCRNUR NEGATIVE 09/06/2009 0037   LABBENZ NEGATIVE 09/06/2009 0037   AMPHETMU NEGATIVE 09/06/2009 0037     Recent Results (from the past 240 hour(s))  URINE CULTURE     Status: Normal   Collection Time   11/17/11 11:34 PM      Component Value Range Status Comment   Specimen Description URINE, RANDOM   Final    Special Requests NONE   Final    Culture  Setup Time 11/18/2011 11:07   Final    Colony Count 30,000 COLONIES/ML   Final    Culture     Final    Value: Multiple bacterial morphotypes  present, none predominant. Suggest appropriate recollection if clinically indicated.   Report Status 11/19/2011 FINAL   Final   MRSA PCR SCREENING     Status: Normal   Collection Time   11/18/11  3:25 AM      Component Value Range Status Comment   MRSA by PCR NEGATIVE  NEGATIVE Final     Studies/Results: Mr Cervical Spine W Wo Contrast  11/24/2011  *RADIOLOGY REPORT*  Clinical Data: Fall.  Paresthesias.  MRI CERVICAL SPINE WITHOUT AND WITH CONTRAST  Technique:  Multiplanar and multiecho pulse sequences of the cervical spine, to include the craniocervical junction and cervicothoracic junction, were obtained according to standard protocol without and with intravenous contrast.  Contrast: 15mL MULTIHANCE GADOBENATE DIMEGLUMINE 529 MG/ML IV SOLN  Comparison: CT 11/17/2011.  MRI 09/10/2009.  Findings: The foramen magnum is widely patent.  C1-2 and C2-3 are unremarkable except for mild facet degeneration.  C3-4 shows mild bulging of the disc and mild facet degeneration but no significant stenosis.  C4-5 shows mild spondylosis and facet degeneration but no significant stenosis.  There is mild foraminal narrowing on the right.  At C5-6, there is evidence of acute injury with interspinous edema and some edema of the facet joints.  There is mild prevertebral edema.  There are endplate osteophytes and chronic protrusion of disc material.  The canal is narrowed with an AP diameter of  only 6 mm.  There is mild cord edema.  No evidence of cord hemorrhage.  No epidural hematoma.  No definable fracture.  At C6-7, there is spondylosis with endplate osteophytes and bulging of the disc.  No significant canal or foraminal narrowing. There is mild interspinous edema.  At C7-T1, there is mild facet degeneration but no significant stenosis.  The upper thoracic region is unremarkable.  IMPRESSION: Findings most consistent with a hyperflexion injury.  Interspinous edema at the C5-6 level and to a lesser extent at the C6-7 level. No  definable fracture or subluxation.  Spondylosis at C5-6 with canal stenosis, AP diameter being only 6 mm.  There is low-level abnormal cord edema consistent with recent cord injury.  No evidence of cord hemorrhage or epidural hematoma.  There is mild prevertebral edema.   Original Report Authenticated By: Thomasenia Sales, M.D.     Medications: Scheduled Meds:    . amLODipine  10 mg Oral QHS  . atorvastatin  20 mg Oral Daily  . docusate sodium  100 mg Oral BID  . feeding supplement  237 mL Oral BID BM  . feeding supplement  1 Container Oral BID WC  . gabapentin  300 mg Oral TID  . influenza  inactive virus vaccine  0.5 mL Intramuscular Tomorrow-1000  . insulin aspart  0-9 Units Subcutaneous BID  . multivitamin with minerals  1 tablet Oral Daily  . polyethylene glycol  17 g Oral Daily  . sodium bicarbonate  1,300 mg Oral BID  . sodium chloride  250 mL Intravenous Once   Continuous Infusions:  PRN Meds:.LORazepam, morphine injection, nitroGLYCERIN, ondansetron (ZOFRAN) IV, ondansetron, oxyCODONE-acetaminophen  Assessment/Plan:  Principal Problem:  *Upper extremity weakness Active Problems:  Hypertension  Dyslipidemia  DM (diabetes mellitus)  UTI (lower urinary tract infection)  Altered mental status  Fall   Fall -UTI presumed to have played a role in her fall. -Continue PT. -Patient is now refusing SNF, so we will need to arrange for [facility] care.  UTI -Culture negative, but had already received antibiotics in the ED. -Has completed treatment with rocephin.  Bilateral UE Pain and Weakness -Results of MRI reviewed. -Case discussed with neurosurgery, Dr. Yetta Barre. -Plan for OR on Thursday for ACDF at the C5-6 level to allow for plavix washout.   Hyperlipidemia -Continue statin  Encephalopathy -Resolved -Presumed secondary to UTI.  DM -CBGs elevated. -Add lantus 5.  Disposition -Surgery on Thursday to address c-spine MRI findings. -She is requiring large amounts of IV  pain medication for L>R hypersensitivity 2/2 c-spine MRI findings which will difficult DC home while we wait for surgery.     LOS: 9 days   [name] [facility] Pager: [phone] 11/26/2011, 10:11 AM

## 2011-11-27 LAB — CBC
HCT: 31.9 % — ABNORMAL LOW (ref 36.0–46.0)
Hemoglobin: 10.4 g/dL — ABNORMAL LOW (ref 12.0–15.0)
MCHC: 32.6 g/dL (ref 30.0–36.0)
MCV: 91.4 fL (ref 78.0–100.0)
RDW: 13.3 % (ref 11.5–15.5)

## 2011-11-27 LAB — BASIC METABOLIC PANEL
BUN: 24 mg/dL — ABNORMAL HIGH (ref 6–23)
CO2: 30 mEq/L (ref 19–32)
Chloride: 94 mEq/L — ABNORMAL LOW (ref 96–112)
Creatinine, Ser: 1.03 mg/dL (ref 0.50–1.10)

## 2011-11-27 LAB — GLUCOSE, CAPILLARY: Glucose-Capillary: 259 mg/dL — ABNORMAL HIGH (ref 70–99)

## 2011-11-27 MED ORDER — INSULIN GLARGINE 100 UNIT/ML ~~LOC~~ SOLN
10.0000 [IU] | Freq: Every day | SUBCUTANEOUS | Status: DC
  Administered 2011-11-27 – 2011-11-28 (×2): 10 [IU] via SUBCUTANEOUS

## 2011-11-27 NOTE — Progress Notes (Signed)
[facility]             Progress Note   Subjective: Sleeping peacefully this am. No family present at bedside.  Objective: Vital signs in last 24 hours: Temp:  [98.2 F (36.8 C)-98.8 F (37.1 C)] 98.4 F (36.9 C) (09/16 0420) Pulse Rate:  [70-97] 70  (09/16 0420) Resp:  [18] 18  (09/16 0420) BP: (118-143)/(43-84) 118/80 mmHg (09/16 0420) SpO2:  [93 %-98 %] 94 % (09/16 0420) Weight:  [63.957 kg (141 lb)] 63.957 kg (141 lb) (09/15 2055) Weight change: 0.454 kg (1 lb) Last BM Date: 11/24/11  Intake/Output from previous day: 09/15 0701 - 09/16 0700 In: 240 [P.O.:240] Out: 1350 [Urine:1350]     Physical Exam: General: Alert, awake, oriented x3. HEENT: No bruits, no goiter. Heart: Regular rate and rhythm, has a SEM. Lungs: Clear to auscultation bilaterally. Abdomen: Soft, nontender, nondistended, positive bowel sounds. Extremities: No clubbing cyanosis or edema with positive pedal pulses. No upper extremity deformities noted.    Lab Results: CBG:  Basename 11/26/11 2338 11/26/11 1034 11/25/11 2204 11/25/11 1058 11/24/11 2308 11/24/11 2157  GLUCAP 199* 277* 257* 339* 217* 236*   Urine Drug Screen: Drugs of Abuse     Component Value Date/Time   LABOPIA  Value: POSITIVE (NOTE) Result repeated and verified. Sent for confirmatory testing* 09/06/2009 0037   COCAINSCRNUR NEGATIVE 09/06/2009 0037   LABBENZ NEGATIVE 09/06/2009 0037   AMPHETMU NEGATIVE 09/06/2009 0037     Recent Results (from the past 240 hour(s))  URINE CULTURE     Status: Normal   Collection Time   11/17/11 11:34 PM      Component Value Range Status Comment   Specimen Description URINE, RANDOM   Final    Special Requests NONE   Final    Culture  Setup Time 11/18/2011 11:07   Final    Colony Count 30,000 COLONIES/ML   Final    Culture     Final    Value: Multiple bacterial morphotypes present, none predominant. Suggest appropriate recollection if clinically indicated.   Report Status  11/19/2011 FINAL   Final   MRSA PCR SCREENING     Status: Normal   Collection Time   11/18/11  3:25 AM      Component Value Range Status Comment   MRSA by PCR NEGATIVE  NEGATIVE Final     Studies/Results: No results found.  Medications: Scheduled Meds:    . amLODipine  10 mg Oral QHS  . atorvastatin  20 mg Oral Daily  . docusate sodium  100 mg Oral BID  . feeding supplement  237 mL Oral BID BM  . feeding supplement  1 Container Oral BID WC  . gabapentin  300 mg Oral TID  . influenza  inactive virus vaccine  0.5 mL Intramuscular Tomorrow-1000  . insulin aspart  0-9 Units Subcutaneous BID  . insulin glargine  5 Units Subcutaneous Daily  . multivitamin with minerals  1 tablet Oral Daily  . polyethylene glycol  17 g Oral Daily  . sodium bicarbonate  1,300 mg Oral BID  . sodium chloride  250 mL Intravenous Once   Continuous Infusions:  PRN Meds:.LORazepam, morphine injection, nitroGLYCERIN, ondansetron (ZOFRAN) IV, ondansetron, oxyCODONE-acetaminophen  Assessment/Plan:  Principal Problem:  *Upper extremity weakness Active Problems:  Hypertension  Dyslipidemia  DM (diabetes mellitus)  UTI (lower urinary tract infection)  Altered mental status  Fall   Fall -UTI presumed to have played a role in her fall. -Continue PT. -Patient is now  refusing SNF, so we will need to arrange for [facility] care upon DC.  UTI -Culture negative, but had already received antibiotics in the ED. -Has completed treatment with rocephin.  Bilateral UE Pain and Weakness -Results of MRI reviewed. -Case discussed with neurosurgery, Dr. Yetta Barre. -Plan for OR on Thursday for ACDF at the C5-6 level to allow for plavix washout.   Hyperlipidemia -Continue statin  Encephalopathy -Resolved -Presumed secondary to UTI.  DM -CBGs still elevated. -Increase lantus to 5.  Disposition -Surgery on Thursday to address c-spine MRI findings. -She is requiring large amounts of IV pain medication for L>R  hypersensitivity 2/2 c-spine MRI findings which will difficult DC home while we wait for surgery.     LOS: 10 days   [name],[name] [facility] Pager: [phone] 11/27/2011, 9:36 AM

## 2011-11-27 NOTE — Progress Notes (Signed)
Utilization review completed.  

## 2011-11-27 NOTE — Progress Notes (Signed)
Physical Therapy Treatment Patient Details Name: [name] MRN: [id number] DOB: 02-23-1929 Today's Date: 11/27/2011 Time: 7829-5621 PT Time Calculation (min): 23 min  PT Assessment / Plan / Recommendation Comments on Treatment Session  Noted MRI results, Neurosurgery consult, and plan for ACDF on Thursday; Pt continues with good participation in PT and will be a good reahb candidate postop    Follow Up Recommendations  Inpatient Rehab    Barriers to Discharge        Equipment Recommendations  Defer to next venue    Recommendations for Other Services    Frequency Min 3X/week   Plan Discharge plan remains appropriate    Precautions / Restrictions Precautions Precautions: Fall;Cervical Precaution Comments: Pt with hypersensitivity to touch (pain) left arm from elbow to hand Required Braces or Orthoses: Cervical Brace Cervical Brace: Soft collar   Pertinent Vitals/Pain Continued pain and hypersensitivity bil UEs    Mobility  Bed Mobility Bed Mobility: Supine to Sit;Sitting - Scoot to Edge of Bed Supine to Sit: 4: Min assist Sitting - Scoot to Edge of Bed: 5: Supervision Details for Bed Mobility Assistance: Pulled up with RUE, handheld assist of therapist Transfers Transfers: Stand to Sit;Sit to Stand Sit to Stand: 3: Mod assist Stand to Sit: 4: Min assist;With upper extremity assist;With armrests;To chair/3-in-1 Details for Transfer Assistance: Min assist support for balacne and stability during transfer. Pt.   still heavily guarding left UE and not using it during task, keeping it flexed anc close to her mid chest. Ambulation/Gait Ambulation/Gait Assistance: 4: Min assist;3: Mod assist Ambulation Distance (Feet): 40 Feet Assistive device: 1 person hand held assist Ambulation/Gait Assistance Details: Less steady today, with noted decr hip and trunk stability (cues to activate gluteals); less steady with incr distance Gait Pattern: Decreased step length - right;Decreased  step length - left General Gait Details: some scissoring, which is worse today    Exercises     PT Diagnosis:    PT Problem List:   PT Treatment Interventions:     PT Goals Acute Rehab PT Goals Time For Goal Achievement: 12/04/11 Potential to Achieve Goals: Good Pt will go Supine/Side to Sit: with modified independence PT Goal: Supine/Side to Sit - Progress: Progressing toward goal Pt will go Sit to Stand: with modified independence PT Goal: Sit to Stand - Progress: Progressing toward goal Pt will go Stand to Sit: with modified independence PT Goal: Stand to Sit - Progress: Progressing toward goal Pt will Ambulate: >150 feet;with modified independence;with rolling walker;with least restrictive assistive device PT Goal: Ambulate - Progress: Progressing toward goal  Visit Information  Last PT Received On: 11/27/11 Assistance Needed: +1    Subjective Data  Subjective: still painful in left arm Patient Stated Goal: less pain   Cognition  Overall Cognitive Status: Appears within functional limits for tasks assessed/performed Arousal/Alertness: Awake/alert Orientation Level: Appears intact for tasks assessed Behavior During Session: So [facility] for tasks performed    Balance     End of Session PT - End of Session Equipment Utilized During Treatment: Gait belt Activity Tolerance: Patient tolerated treatment well Patient left: in chair;with call bell/phone within reach Nurse Communication: Mobility status   GP     [name], [place] [phone]  11/27/2011, 4:18 PM

## 2011-11-27 NOTE — Progress Notes (Signed)
Inpatient Diabetes Program Recommendations  AACE/ADA: New Consensus Statement on Inpatient Glycemic Control (2013)  Target Ranges:  Prepandial:   less than 140 mg/dL      Peak postprandial:   less than 180 mg/dL (1-2 hours)      Critically ill patients:  140 - 180 mg/dL  Results for KIRBY, ARGUETA (MRN [id number]) as of 11/27/2011 15:00  Ref. Range 11/25/2011 10:58 11/25/2011 22:04 11/26/2011 10:34 11/26/2011 23:38 11/27/2011 10:31  Glucose-Capillary Latest Range: 70-99 mg/dL 409 (H) 811 (H) 914 (H) 199 (H) 327 (H)    Inpatient Diabetes Program Recommendations Correction (SSI): Change Novolog correction scale to TID  Insulin - Meal Coverage: .  Thank you  Piedad Climes RN,BSN,CDE Inpatient Diabetes Coordinator [phone] (team pager)

## 2011-11-28 LAB — GLUCOSE, CAPILLARY: Glucose-Capillary: 250 mg/dL — ABNORMAL HIGH (ref 70–99)

## 2011-11-28 MED ORDER — INSULIN GLARGINE 100 UNIT/ML ~~LOC~~ SOLN
15.0000 [IU] | Freq: Every day | SUBCUTANEOUS | Status: DC
  Administered 2011-11-29 – 2011-12-06 (×7): 15 [IU] via SUBCUTANEOUS

## 2011-11-28 NOTE — Progress Notes (Signed)
[facility]             Progress Note   Subjective: Still complains of severe bilateral UE pain, much worse on the left.  Objective: Vital signs in last 24 hours: Temp:  [98.2 F (36.8 C)-99.2 F (37.3 C)] 99.2 F (37.3 C) (09/17 0900) Pulse Rate:  [71-79] 78  (09/17 0900) Resp:  [16-20] 16  (09/17 0900) BP: (110-144)/(63-72) 131/72 mmHg (09/17 0900) SpO2:  [91 %-98 %] 98 % (09/17 0900) Weight:  [65.7 kg (144 lb 13.5 oz)] 65.7 kg (144 lb 13.5 oz) (09/17 0533) Weight change: 1.743 kg (3 lb 13.5 oz) Last BM Date: 11/27/11 (pt does not recall)  Intake/Output from previous day: 09/16 0701 - 09/17 0700 In: 360 [P.O.:360] Out: 900 [Urine:900] Total I/O In: 120 [P.O.:120] Out: 650 [Urine:650]   Physical Exam: General: Alert, awake, oriented x3. HEENT: No bruits, no goiter. Heart: Regular rate and rhythm, has a SEM. Lungs: Clear to auscultation bilaterally. Abdomen: Soft, nontender, nondistended, positive bowel sounds. Extremities: No clubbing cyanosis or edema with positive pedal pulses. No upper extremity deformities noted.    Lab Results: CBG:  Basename 11/28/11 1028 11/28/11 0752 11/27/11 2201 11/27/11 1031 11/26/11 2338 11/26/11 1034  GLUCAP 250* 149* 259* 327* 199* 277*   Urine Drug Screen: Drugs of Abuse     Component Value Date/Time   LABOPIA  Value: POSITIVE (NOTE) Result repeated and verified. Sent for confirmatory testing* 09/06/2009 0037   COCAINSCRNUR NEGATIVE 09/06/2009 0037   LABBENZ NEGATIVE 09/06/2009 0037   AMPHETMU NEGATIVE 09/06/2009 0037     No results found for this or any previous visit (from the past 240 hour(s)).  Studies/Results: No results found.  Medications: Scheduled Meds:    . amLODipine  10 mg Oral QHS  . atorvastatin  20 mg Oral Daily  . docusate sodium  100 mg Oral BID  . feeding supplement  237 mL Oral BID BM  . feeding supplement  1 Container Oral BID WC  . gabapentin  300 mg Oral TID  . insulin aspart  0-9  Units Subcutaneous BID  . insulin glargine  10 Units Subcutaneous Daily  . multivitamin with minerals  1 tablet Oral Daily  . polyethylene glycol  17 g Oral Daily  . sodium bicarbonate  1,300 mg Oral BID  . sodium chloride  250 mL Intravenous Once   Continuous Infusions:  PRN Meds:.LORazepam, morphine injection, nitroGLYCERIN, ondansetron (ZOFRAN) IV, ondansetron, oxyCODONE-acetaminophen  Assessment/Plan:  Principal Problem:  *Upper extremity weakness Active Problems:  Hypertension  Dyslipidemia  DM (diabetes mellitus)  UTI (lower urinary tract infection)  Altered mental status  Fall   Fall -UTI presumed to have played a role in her fall. -Continue PT. -Patient is now refusing SNF, so we will need to arrange for [facility] care upon DC vs CIR.  UTI -Culture negative, but had already received antibiotics in the ED. -Has completed treatment with rocephin.  Bilateral UE Pain and Weakness -Results of MRI reviewed. -Case discussed with neurosurgery, Dr. Yetta Barre. -Plan for OR on Thursday for ACDF at the C5-6 level to allow for plavix washout.   Hyperlipidemia -Continue statin  Encephalopathy -Resolved -Presumed secondary to UTI.  DM -CBGs still elevated. -Increase lantus to 15 .  Disposition -Surgery on Thursday to address c-spine MRI findings. -She is requiring large amounts of IV pain medication for L>R hypersensitivity 2/2 c-spine MRI findings which will difficult DC home while we wait for surgery. -Consult CIR after surgery.     LOS:  11 days   [facility] Pager: [phone] 11/28/2011, 1:10 PM

## 2011-11-28 NOTE — Progress Notes (Signed)
Inpatient Diabetes Program Recommendations  AACE/ADA: New Consensus Statement on Inpatient Glycemic Control (2013)  Target Ranges:  Prepandial:   less than 140 mg/dL      Peak postprandial:   less than 180 mg/dL (1-2 hours)      Critically ill patients:  140 - 180 mg/dL   Results for KIMALA, HORNE (MRN [id number]) as of 11/28/2011 16:21  Ref. Range 11/27/2011 10:31 11/27/2011 22:01 11/28/2011 07:52 11/28/2011 10:28  Glucose-Capillary Latest Range: 70-99 mg/dL 409 (H) 811 (H) 914 (H) 250 (H)   Inpatient Diabetes Program Recommendations Correction (SSI): Change Novolog correction scale to TID + HS scale Insulin - Meal Coverage: Post prandial hyperglycemia may benefit from addition of Novolog with meals (start with 4 units)  Thank you  Piedad Climes RN,BSN,CDE Inpatient Diabetes Coordinator [phone]

## 2011-11-29 ENCOUNTER — Inpatient Hospital Stay (HOSPITAL_COMMUNITY): Payer: Medicare Other

## 2011-11-29 LAB — GLUCOSE, CAPILLARY: Glucose-Capillary: 327 mg/dL — ABNORMAL HIGH (ref 70–99)

## 2011-11-29 LAB — HEMOGLOBIN A1C: Mean Plasma Glucose: 169 mg/dL — ABNORMAL HIGH (ref <117)

## 2011-11-29 MED ORDER — INSULIN ASPART 100 UNIT/ML ~~LOC~~ SOLN
0.0000 [IU] | Freq: Every day | SUBCUTANEOUS | Status: DC
  Administered 2011-12-02 – 2011-12-03 (×2): 2 [IU] via SUBCUTANEOUS
  Filled 2011-11-29: qty 0.05

## 2011-11-29 MED ORDER — INSULIN ASPART 100 UNIT/ML ~~LOC~~ SOLN
0.0000 [IU] | Freq: Three times a day (TID) | SUBCUTANEOUS | Status: DC
  Administered 2011-11-29: 7 [IU] via SUBCUTANEOUS
  Administered 2011-11-30: 1 [IU] via SUBCUTANEOUS
  Administered 2011-12-01: 18:00:00 via SUBCUTANEOUS
  Administered 2011-12-01: 1 [IU] via SUBCUTANEOUS
  Administered 2011-12-02 – 2011-12-03 (×4): 2 [IU] via SUBCUTANEOUS
  Administered 2011-12-03: 5 [IU] via SUBCUTANEOUS
  Administered 2011-12-03: 1 [IU] via SUBCUTANEOUS
  Administered 2011-12-04: 3 [IU] via SUBCUTANEOUS
  Administered 2011-12-04: 7 [IU] via SUBCUTANEOUS
  Administered 2011-12-05: 5 [IU] via SUBCUTANEOUS
  Administered 2011-12-05: 3 [IU] via SUBCUTANEOUS
  Administered 2011-12-06: 2 [IU] via SUBCUTANEOUS

## 2011-11-29 NOTE — Progress Notes (Signed)
OT PROGRESS NOTE  Pt scheduled for surgery tomorrow. Please reorder OT as appropriate after surgery. Will hold OT until after surgery. Thank you. [facility], OTR/L  [phone] 11/29/2011

## 2011-11-29 NOTE — Progress Notes (Signed)
Patient ID: [name], female   DOB: [birth year], 78 y.o.   MRN: [id number] Pt unchanged. Still c/o of pain in L arm and neck/ shoulder pain. Plan ACDF C5-6 tomorrow. NPO after MN. Discussed with pt at length. Risks of the surgery include but are not limited to bleeding, infection, spinal cord injury, numbness, weakness, paralysis, vocal cord weakness, esophageal or tracheal injury, carotid or vertebral artery injury, nerve root injury, CSF leak, pseudoarthrosis, hardware failure, possible need for further surgery, failure of the surgery, lack of relief of symptoms or worsening of symptoms, and anesthesia risks. She understands typical outcomes and recovery times. She agrees to proceed.

## 2011-11-29 NOTE — Progress Notes (Signed)
Nutrition Follow-up  Intervention:  Continue current interventions, encourage foods as able, offer alternatives if pt refuses.   Assessment:   Pt consuming 5 - 50% of meals. Awaiting surgery tomorrow. Pt consume supplements, will sometimes refuse Ensure Pudding.  Pt states that she is eating as much as she can, but is often limited to complete most of her meals 2/2 pain.  Diet Order:  Dysphagia 3 Supplement: Ensure Complete po BID, Ensure Pudding po BID  Meds: Scheduled Meds:   . amLODipine  10 mg Oral QHS  . atorvastatin  20 mg Oral Daily  . docusate sodium  100 mg Oral BID  . feeding supplement  237 mL Oral BID BM  . feeding supplement  1 Container Oral BID WC  . gabapentin  300 mg Oral TID  . insulin aspart  0-9 Units Subcutaneous BID  . insulin glargine  15 Units Subcutaneous Daily  . multivitamin with minerals  1 tablet Oral Daily  . polyethylene glycol  17 g Oral Daily  . sodium bicarbonate  1,300 mg Oral BID  . sodium chloride  250 mL Intravenous Once  . DISCONTD: insulin glargine  10 Units Subcutaneous Daily   Continuous Infusions:  PRN Meds:.LORazepam, morphine injection, nitroGLYCERIN, ondansetron (ZOFRAN) IV, ondansetron, oxyCODONE-acetaminophen  Labs:  CMP     Component Value Date/Time   NA 134* 11/27/2011 0425   K 4.5 11/27/2011 0425   CL 94* 11/27/2011 0425   CO2 30 11/27/2011 0425   GLUCOSE 325* 11/27/2011 0425   BUN 24* 11/27/2011 0425   CREATININE 1.03 11/27/2011 0425   CALCIUM 10.5 11/27/2011 0425   PROT 8.3 11/17/2011 2230   ALBUMIN 4.6 11/17/2011 2230   AST 45* 11/17/2011 2230   ALT 21 11/17/2011 2230   ALKPHOS 88 11/17/2011 2230   BILITOT 0.5 11/17/2011 2230   GFRNONAA 50* 11/27/2011 0425   GFRAA 57* 11/27/2011 0425   CBG (last 3)   Basename 11/29/11 0915 11/28/11 2230 11/28/11 1028  GLUCAP 195* 232* 250*    Intake/Output Summary (Last 24 hours) at 11/29/11 1126 Last data filed at 11/29/11 0900  Gross per 24 hour  Intake    720 ml  Output      0 ml  Net     720 ml  BM 9/17  Weight Status:  144 lb/65.7 kg - wt up 3 lb x 6 days  Body mass index is 23.38 kg/(m^2). Weight is WNL.  Estimated needs:  1480 - 1680 kcal, 64 - 74 grams protein  Nutrition Dx:  Inadequate oral intake r/t poor appetite and lethargy AEB poor meal completion. Improving.  Goal:  Pt to meet >/= 90% of their estimated nutrition needs. Not met.  Monitor:  weight trends, lab trends, I/O's, PO intake, supplement tolerance  Jarold Motto MS, RD, LDN Pager: [phone] After-hours pager: [phone]

## 2011-11-29 NOTE — Progress Notes (Signed)
Subjective:  patient is sitting up in a chair with a C-collar in place. Her major complaints are burning type pain down both upper extremities. However she states that the pain on the left is greater than on the right.  The patient and I discussed options for pain management and at this time we will defer escalating her pain regimen any further in deference to her scheduled decompressive surgery tomorrow. However we both agree the patient may benefit from a hot compress to the left shoulder which is the side onto which she fell. Objective: Filed Vitals:   11/28/11 2205 11/29/11 0624 11/29/11 1000 11/29/11 1400  BP: 121/71 118/70 113/51 113/66  Pulse: 72 77 67 69  Temp: 98.3 F (36.8 C) 97.8 F (36.6 C) 98.4 F (36.9 C) 98.6 F (37 C)  TempSrc: Oral Oral Oral Oral  Resp: 18 17 18 18   Height:      Weight:      SpO2: 96% 96% 94% 92%   Weight change:   Intake/Output Summary (Last 24 hours) at 11/29/11 1616 Last data filed at 11/29/11 1300   Gross per 24 hour  Intake    600 ml  Output    300 ml  Net    300 ml    General: Alert, awake, oriented x3, in no acute distress.  HEENT: [place]/AT PEERL, EOMI C-collar in place. Neck: Trachea midline,  no masses, no thyromegal,y no JVD, no carotid bruit OROPHARYNX:  Moist, No exudate/ erythema/lesions.  Heart: Regular rate and rhythm, without murmurs, rubs, gallops, PMI non-displaced, no heaves or thrills on palpation.  Lungs: Clear to auscultation, no wheezing or rhonchi noted. No increased vocal fremitus resonant to percussion  Abdomen: Soft, nontender, nondistended, positive bowel sounds, no masses no hepatosplenomegaly noted..  Neuro: No focal neurological deficits noted . Musculoskeletal: No warm swelling or erythema around joints.   Lab Results:  [facility] 11/27/11 0425  NA 134*  K 4.5  CL 94*  CO2 30  GLUCOSE 325*  BUN 24*  CREATININE 1.03  CALCIUM 10.5  MG --  PHOS --   No results found for this basename:  AST:2,ALT:2,ALKPHOS:2,BILITOT:2,PROT:2,ALBUMIN:2 in the last 72 hours No results found for this basename: LIPASE:2,AMYLASE:2 in the last 72 hours  Basename 11/27/11 0425  WBC 6.9  NEUTROABS --  HGB 10.4*  HCT 31.9*  MCV 91.4  PLT 251   No results found for this basename: CKTOTAL:3,CKMB:3,CKMBINDEX:3,TROPONINI:3 in the last 72 hours No components found with this basename: POCBNP:3 No results found for this basename: DDIMER:2 in the last 72 hours No results found for this basename: HGBA1C:2 in the last 72 hours No results found for this basename: CHOL:2,HDL:2,LDLCALC:2,TRIG:2,CHOLHDL:2,LDLDIRECT:2 in the last 72 hours No results found for this basename: TSH,T4TOTAL,FREET3,T3FREE,THYROIDAB in the last 72 hours No results found for this basename: VITAMINB12:2,FOLATE:2,FERRITIN:2,TIBC:2,IRON:2,RETICCTPCT:2 in the last 72 hours  Micro Results: No results found for this or any previous visit (from the past 240 hour(s)).  Studies/Results: Dg Cervical Spine 1 View  11/29/2011  *RADIOLOGY REPORT*  Clinical Data: Preop for cervical spine surgery  DG CERVICAL SPINE - 1 VIEW  Comparison: MR cervical spine of 11/24/2011  Findings: On the single lateral view obtained the cervical vertebrae are in normal alignment.  There is diffuse degenerative spurring present.  Degenerative disc disease is noted at C4-5 and C5-6 levels with loss of disc space and spurring.  No cervical spine fracture is seen.  No prevertebral soft tissue swelling is noted.  IMPRESSION: Normal alignment.  Diffuse degenerative change  with degenerative disc disease particularly at C4-5 and C5-6.   Original Report Authenticated By: Juline Patch, M.D.    Ct Head Wo Contrast  11/17/2011  *RADIOLOGY REPORT*  Clinical Data:  Fall.  Head injury.  Frontal scalp hematoma and headache.  Epistaxis.  Neck pain.  CT HEAD WITHOUT CONTRAST CT CERVICAL SPINE WITHOUT CONTRAST  Technique:  Multidetector CT imaging of the head and cervical spine was  performed following the standard protocol without intravenous contrast.  Multiplanar CT image reconstructions of the cervical spine were also generated.  Comparison:  Head CT on 09/05/2009  CT HEAD  Findings: There is no evidence of intracranial hemorrhage, brain edema or other signs of acute infarction.  There is no evidence of intracranial mass lesion or mass effect.  No abnormal extra-axial fluid collections are identified.  Mild cerebral atrophy and chronic small vessel disease are unchanged in appearance.  Ventricles remain stable in size.  No evidence of skull fracture.  IMPRESSION:  1.  No acute intracranial abnormality. 2.  Stable mild cerebral atrophy and chronic small vessel disease.  CT CERVICAL SPINE  Findings: No evidence of cervical spine fracture or subluxation.  Mild to moderate degenerative disc disease is seen from levels of C4 to C7.  Moderate facet DJD is also seen bilaterally, left side greater than right.  IMPRESSION:  1. Negative for acute cervical spine fracture or subluxation. 2.  Moderate cervical spondylosis, as described above.   Original Report Authenticated By: Danae Orleans, M.D.    Ct Cervical Spine Wo Contrast  11/17/2011  *RADIOLOGY REPORT*  Clinical Data:  Fall.  Head injury.  Frontal scalp hematoma and headache.  Epistaxis.  Neck pain.  CT HEAD WITHOUT CONTRAST CT CERVICAL SPINE WITHOUT CONTRAST  Technique:  Multidetector CT imaging of the head and cervical spine was performed following the standard protocol without intravenous contrast.  Multiplanar CT image reconstructions of the cervical spine were also generated.  Comparison:  Head CT on 09/05/2009  CT HEAD  Findings: There is no evidence of intracranial hemorrhage, brain edema or other signs of acute infarction.  There is no evidence of intracranial mass lesion or mass effect.  No abnormal extra-axial fluid collections are identified.  Mild cerebral atrophy and chronic small vessel disease are unchanged in appearance.   Ventricles remain stable in size.  No evidence of skull fracture.  IMPRESSION:  1.  No acute intracranial abnormality. 2.  Stable mild cerebral atrophy and chronic small vessel disease.  CT CERVICAL SPINE  Findings: No evidence of cervical spine fracture or subluxation.  Mild to moderate degenerative disc disease is seen from levels of C4 to C7.  Moderate facet DJD is also seen bilaterally, left side greater than right.  IMPRESSION:  1. Negative for acute cervical spine fracture or subluxation. 2.  Moderate cervical spondylosis, as described above.   Original Report Authenticated By: Danae Orleans, M.D.    Mr Cervical Spine W Wo Contrast  11/24/2011  *RADIOLOGY REPORT*  Clinical Data: Fall.  Paresthesias.  MRI CERVICAL SPINE WITHOUT AND WITH CONTRAST  Technique:  Multiplanar and multiecho pulse sequences of the cervical spine, to include the craniocervical junction and cervicothoracic junction, were obtained according to standard protocol without and with intravenous contrast.  Contrast: 15mL MULTIHANCE GADOBENATE DIMEGLUMINE 529 MG/ML IV SOLN  Comparison: CT 11/17/2011.  MRI 09/10/2009.  Findings: The foramen magnum is widely patent.  C1-2 and C2-3 are unremarkable except for mild facet degeneration.  C3-4 shows mild bulging of the  disc and mild facet degeneration but no significant stenosis.  C4-5 shows mild spondylosis and facet degeneration but no significant stenosis.  There is mild foraminal narrowing on the right.  At C5-6, there is evidence of acute injury with interspinous edema and some edema of the facet joints.  There is mild prevertebral edema.  There are endplate osteophytes and chronic protrusion of disc material.  The canal is narrowed with an AP diameter of only 6 mm.  There is mild cord edema.  No evidence of cord hemorrhage.  No epidural hematoma.  No definable fracture.  At C6-7, there is spondylosis with endplate osteophytes and bulging of the disc.  No significant canal or foraminal  narrowing. There is mild interspinous edema.  At C7-T1, there is mild facet degeneration but no significant stenosis.  The upper thoracic region is unremarkable.  IMPRESSION: Findings most consistent with a hyperflexion injury.  Interspinous edema at the C5-6 level and to a lesser extent at the C6-7 level. No definable fracture or subluxation.  Spondylosis at C5-6 with canal stenosis, AP diameter being only 6 mm.  There is low-level abnormal cord edema consistent with recent cord injury.  No evidence of cord hemorrhage or epidural hematoma.  There is mild prevertebral edema.   Original Report Authenticated By: Thomasenia Sales, M.D.    Dg Hand 2 View Left  11/23/2011  *RADIOLOGY REPORT*  Clinical Data: Left hand pain.  Fall.  LEFT HAND - 2 VIEW  Comparison: None.  Findings: No acute fracture and no dislocation.  Mild degenerative change of the IP joints.  IMPRESSION: No acute bony pathology.   Original Report Authenticated By: Donavan Burnet, M.D.    Dg Chest Port 1 View  11/18/2011  *RADIOLOGY REPORT*  Clinical Data: Altered mental status.  Fall.  Pain.  PORTABLE CHEST - 1 VIEW  Comparison: 12/07/2009  Findings: Stable appearance of postoperative changes in the mediastinum.  Shallow inspiration. The heart size and pulmonary vascularity are normal. The lungs appear clear and expanded without focal air space disease or consolidation. No blunting of the costophrenic angles.  No pneumothorax.  Mediastinal contours appear intact.  Degenerative changes in the spine and shoulders.  No significant change since previous study.  IMPRESSION: Shallow inspiration.  No evidence of active pulmonary disease.   Original Report Authenticated By: Marlon Pel, M.D.    Dg Shoulder Left Port  11/19/2011  *RADIOLOGY REPORT*  Clinical Data: 78 year old female fall 3 days ago.  Pain.  PORTABLE LEFT SHOULDER - 2+ VIEW  Comparison: None.  Findings: Portable AP and scapular Y views. No glenohumeral joint dislocation.  Left  clavicle appears intact.  Proximal left humerus appears intact.  Left scapula and visualized left ribs appear grossly intact.  IMPRESSION: No acute fracture or dislocation identified about the left shoulder.   Original Report Authenticated By: Harley Hallmark, M.D.     Medications: I have reviewed the patient's current medications. Scheduled Meds:   . amLODipine  10 mg Oral QHS  . atorvastatin  20 mg Oral Daily  . docusate sodium  100 mg Oral BID  . feeding supplement  237 mL Oral BID BM  . feeding supplement  1 Container Oral BID WC  . gabapentin  300 mg Oral TID  . insulin aspart  0-9 Units Subcutaneous BID  . insulin glargine  15 Units Subcutaneous Daily  . multivitamin with minerals  1 tablet Oral Daily  . polyethylene glycol  17 g Oral Daily  . sodium  bicarbonate  1,300 mg Oral BID  . sodium chloride  250 mL Intravenous Once   Continuous Infusions:  PRN Meds:.LORazepam, morphine injection, nitroGLYCERIN, ondansetron (ZOFRAN) IV, ondansetron, oxyCODONE-acetaminophen Assessment/Plan: Patient Active Hospital Problem List: Fall (11/18/2011)   Assessment: UTI presumed to have been ruled her fall. The patient is seen and evaluated by physical therapy who recommended inpatient rehabilitation postsurgically. Will get CIR evaluation postsurgically.     UTI (lower urinary tract infection) (11/18/2011)   Assessment: Urine culture was negative however patient had ordered he received antibiotics in the emergency room. Nevertheless the patient has completed treatment with Rocephin   Upper extremity weakness and pain (11/24/2011)   Assessment: Based on the MRI the patient has an acute injury at C5-6 and some evidence of central cord type injury. She's been seen by neurosurgery and is scheduled for decompressive surgery  tomorrow.    Dyslipidemia ()   Assessment: Continue statin.   DM (diabetes mellitus) ()   Assessment: We'll continue the patient on current dose of Lantus and switch to sliding  scale to a.c. and at bedtime coverage. I will also check a hemoglobin A1c .    Altered mental status (11/18/2011)   Assessment: Resolved.      LOS: 12 days

## 2011-11-29 NOTE — Progress Notes (Signed)
Physical Therapy Cancellation   11/29/11 1600  PT Visit Information  Last PT Received On 11/29/11  Reason Eval/Treat Not Completed Patient refused (politely declined; Will follow up Friday post op)    Van Clines, [place] [phone]

## 2011-11-30 ENCOUNTER — Inpatient Hospital Stay (HOSPITAL_COMMUNITY): Payer: Medicare Other | Admitting: Anesthesiology

## 2011-11-30 ENCOUNTER — Encounter (HOSPITAL_COMMUNITY): Payer: Self-pay | Admitting: Anesthesiology

## 2011-11-30 ENCOUNTER — Encounter (HOSPITAL_COMMUNITY)
Admission: EM | Disposition: A | Discharge status: Rehabilitation Facility | Payer: Self-pay | Source: Home / Self Care | Attending: Internal Medicine

## 2011-11-30 ENCOUNTER — Inpatient Hospital Stay (HOSPITAL_COMMUNITY): Payer: Medicare Other

## 2011-11-30 HISTORY — PX: ANTERIOR CERVICAL DECOMP/DISCECTOMY FUSION: SHX1161

## 2011-11-30 LAB — GLUCOSE, CAPILLARY
Glucose-Capillary: 113 mg/dL — ABNORMAL HIGH (ref 70–99)
Glucose-Capillary: 188 mg/dL — ABNORMAL HIGH (ref 70–99)

## 2011-11-30 SURGERY — ANTERIOR CERVICAL DECOMPRESSION/DISCECTOMY FUSION 1 LEVEL
Anesthesia: General | Wound class: Clean

## 2011-11-30 MED ORDER — SODIUM CHLORIDE 0.9 % IJ SOLN
3.0000 mL | Freq: Two times a day (BID) | INTRAMUSCULAR | Status: DC
  Administered 2011-12-01 – 2011-12-04 (×7): 3 mL via INTRAVENOUS

## 2011-11-30 MED ORDER — PHENYLEPHRINE HCL 10 MG/ML IJ SOLN
INTRAMUSCULAR | Status: DC | PRN
  Administered 2011-11-30: 80 ug via INTRAVENOUS
  Administered 2011-11-30: 40 ug via INTRAVENOUS
  Administered 2011-11-30: 80 ug via INTRAVENOUS
  Administered 2011-11-30 (×2): 120 ug via INTRAVENOUS
  Administered 2011-11-30: 80 ug via INTRAVENOUS

## 2011-11-30 MED ORDER — THROMBIN 5000 UNITS EX SOLR
OROMUCOSAL | Status: DC | PRN
  Administered 2011-11-30: 14:00:00 via TOPICAL

## 2011-11-30 MED ORDER — ROCURONIUM BROMIDE 100 MG/10ML IV SOLN
INTRAVENOUS | Status: DC | PRN
  Administered 2011-11-30: 50 mg via INTRAVENOUS

## 2011-11-30 MED ORDER — LIDOCAINE HCL (CARDIAC) 20 MG/ML IV SOLN
INTRAVENOUS | Status: DC | PRN
  Administered 2011-11-30: 60 mg via INTRAVENOUS

## 2011-11-30 MED ORDER — GLYCOPYRROLATE 0.2 MG/ML IJ SOLN
INTRAMUSCULAR | Status: DC | PRN
  Administered 2011-11-30: 0.6 mg via INTRAVENOUS

## 2011-11-30 MED ORDER — CEFAZOLIN SODIUM 1-5 GM-% IV SOLN
1.0000 g | Freq: Three times a day (TID) | INTRAVENOUS | Status: AC
  Administered 2011-11-30 – 2011-12-01 (×2): 1 g via INTRAVENOUS
  Filled 2011-11-30 (×3): qty 50

## 2011-11-30 MED ORDER — THROMBIN 5000 UNITS EX SOLR
CUTANEOUS | Status: DC | PRN
  Administered 2011-11-30 (×2): 5000 [IU] via TOPICAL

## 2011-11-30 MED ORDER — FENTANYL CITRATE 0.05 MG/ML IJ SOLN
INTRAMUSCULAR | Status: DC | PRN
  Administered 2011-11-30: 125 ug via INTRAVENOUS

## 2011-11-30 MED ORDER — LACTATED RINGERS IV SOLN
INTRAVENOUS | Status: DC | PRN
  Administered 2011-11-30 (×2): via INTRAVENOUS

## 2011-11-30 MED ORDER — EPHEDRINE SULFATE 50 MG/ML IJ SOLN
INTRAMUSCULAR | Status: DC | PRN
  Administered 2011-11-30: 10 mg via INTRAVENOUS

## 2011-11-30 MED ORDER — 0.9 % SODIUM CHLORIDE (POUR BTL) OPTIME
TOPICAL | Status: DC | PRN
  Administered 2011-11-30: 1000 mL

## 2011-11-30 MED ORDER — ONDANSETRON HCL 4 MG/2ML IJ SOLN
INTRAMUSCULAR | Status: DC | PRN
  Administered 2011-11-30: 4 mg via INTRAVENOUS

## 2011-11-30 MED ORDER — NEOSTIGMINE METHYLSULFATE 1 MG/ML IJ SOLN
INTRAMUSCULAR | Status: DC | PRN
  Administered 2011-11-30: 3 mg via INTRAVENOUS

## 2011-11-30 MED ORDER — SODIUM CHLORIDE 0.9 % IR SOLN
Status: DC | PRN
  Administered 2011-11-30: 14:00:00

## 2011-11-30 MED ORDER — MIDAZOLAM HCL 5 MG/5ML IJ SOLN
INTRAMUSCULAR | Status: DC | PRN
  Administered 2011-11-30: 1 mg via INTRAVENOUS

## 2011-11-30 MED ORDER — FENTANYL CITRATE 0.05 MG/ML IJ SOLN
INTRAMUSCULAR | Status: AC
  Administered 2011-11-30: 25 ug via INTRAVENOUS
  Filled 2011-11-30: qty 2

## 2011-11-30 MED ORDER — POTASSIUM CHLORIDE IN NACL 20-0.9 MEQ/L-% IV SOLN
INTRAVENOUS | Status: DC
  Administered 2011-11-30: 18:00:00 via INTRAVENOUS
  Filled 2011-11-30 (×10): qty 1000

## 2011-11-30 MED ORDER — FENTANYL CITRATE 0.05 MG/ML IJ SOLN
INTRAMUSCULAR | Status: AC
  Filled 2011-11-30: qty 2

## 2011-11-30 MED ORDER — PROPOFOL 10 MG/ML IV BOLUS
INTRAVENOUS | Status: DC | PRN
  Administered 2011-11-30: 150 mg via INTRAVENOUS

## 2011-11-30 MED ORDER — MENTHOL 3 MG MT LOZG
1.0000 | LOZENGE | OROMUCOSAL | Status: DC | PRN
  Filled 2011-11-30: qty 9

## 2011-11-30 MED ORDER — SUCCINYLCHOLINE CHLORIDE 20 MG/ML IJ SOLN
INTRAMUSCULAR | Status: DC | PRN
  Administered 2011-11-30: 100 mg via INTRAVENOUS

## 2011-11-30 MED ORDER — SODIUM CHLORIDE 0.9 % IJ SOLN
3.0000 mL | INTRAMUSCULAR | Status: DC | PRN
  Administered 2011-12-03: 3 mL via INTRAVENOUS

## 2011-11-30 MED ORDER — HEMOSTATIC AGENTS (NO CHARGE) OPTIME
TOPICAL | Status: DC | PRN
  Administered 2011-11-30: 1 via TOPICAL

## 2011-11-30 MED ORDER — FENTANYL CITRATE 0.05 MG/ML IJ SOLN
25.0000 ug | INTRAMUSCULAR | Status: DC | PRN
  Administered 2011-11-30: 25 ug via INTRAVENOUS
  Administered 2011-11-30: 50 ug via INTRAVENOUS
  Administered 2011-11-30 (×2): 25 ug via INTRAVENOUS

## 2011-11-30 MED ORDER — CEFAZOLIN SODIUM-DEXTROSE 2-3 GM-% IV SOLR
INTRAVENOUS | Status: DC | PRN
  Administered 2011-11-30: 2 g via INTRAVENOUS

## 2011-11-30 MED ORDER — BUPIVACAINE HCL (PF) 0.25 % IJ SOLN
INTRAMUSCULAR | Status: DC | PRN
  Administered 2011-11-30: 4 mL

## 2011-11-30 MED ORDER — SODIUM CHLORIDE 0.9 % IV SOLN: 250.0000 mL | INTRAVENOUS | Status: DC

## 2011-11-30 MED ORDER — PHENOL 1.4 % MT LIQD
1.0000 | OROMUCOSAL | Status: DC | PRN
  Filled 2011-11-30: qty 177

## 2011-11-30 MED ORDER — ACETAMINOPHEN 325 MG PO TABS
650.0000 mg | ORAL_TABLET | ORAL | Status: DC | PRN
  Administered 2011-12-02 (×2): 650 mg via ORAL
  Filled 2011-11-30 (×2): qty 2

## 2011-11-30 MED ORDER — TRAMADOL HCL 50 MG PO TABS
50.0000 mg | ORAL_TABLET | Freq: Four times a day (QID) | ORAL | Status: DC | PRN
  Administered 2011-11-30 – 2011-12-06 (×12): 50 mg via ORAL
  Filled 2011-11-30 (×13): qty 1

## 2011-11-30 MED ORDER — ACETAMINOPHEN 650 MG RE SUPP: 650.0000 mg | RECTAL | Status: DC | PRN

## 2011-11-30 MED ORDER — ONDANSETRON HCL 4 MG/2ML IJ SOLN: 4.0000 mg | INTRAMUSCULAR | Status: DC | PRN

## 2011-11-30 SURGICAL SUPPLY — 50 items
APL SKNCLS STERI-STRIP NONHPOA (GAUZE/BANDAGES/DRESSINGS) ×1
BAG DECANTER FOR FLEXI CONT (MISCELLANEOUS) ×2 IMPLANT
BENZOIN TINCTURE PRP APPL 2/3 (GAUZE/BANDAGES/DRESSINGS) ×2 IMPLANT
BUR MATCHSTICK NEURO 3.0 LAGG (BURR) ×2 IMPLANT
CANISTER SUCTION 2500CC (MISCELLANEOUS) ×2 IMPLANT
CLOTH BEACON ORANGE TIMEOUT ST (SAFETY) ×2 IMPLANT
CONT SPEC 4OZ CLIKSEAL STRL BL (MISCELLANEOUS) ×2 IMPLANT
COROENT SM 7MM (Cage) ×1 IMPLANT
DRAPE C-ARM 42X72 X-RAY (DRAPES) ×4 IMPLANT
DRAPE LAPAROTOMY 100X72 PEDS (DRAPES) ×2 IMPLANT
DRAPE MICROSCOPE ZEISS OPMI (DRAPES) ×2 IMPLANT
DRAPE POUCH INSTRU U-SHP 10X18 (DRAPES) ×2 IMPLANT
DRESSING TELFA 8X3 (GAUZE/BANDAGES/DRESSINGS) ×2 IMPLANT
DRSG OPSITE 4X5.5 SM (GAUZE/BANDAGES/DRESSINGS) ×2 IMPLANT
DURAPREP 6ML APPLICATOR 50/CS (WOUND CARE) ×2 IMPLANT
ELECT COATED BLADE 2.86 ST (ELECTRODE) ×2 IMPLANT
ELECT REM PT RETURN 9FT ADLT (ELECTROSURGICAL) ×2
ELECTRODE REM PT RTRN 9FT ADLT (ELECTROSURGICAL) ×1 IMPLANT
GAUZE SPONGE 4X4 16PLY XRAY LF (GAUZE/BANDAGES/DRESSINGS) IMPLANT
GLOVE BIO SURGEON STRL SZ8 (GLOVE) ×2 IMPLANT
GLOVE BIOGEL PI IND STRL 7.5 (GLOVE) IMPLANT
GLOVE BIOGEL PI INDICATOR 7.5 (GLOVE) ×1
GLOVE ECLIPSE 7.5 STRL STRAW (GLOVE) ×3 IMPLANT
GOWN BRE IMP SLV AUR LG STRL (GOWN DISPOSABLE) IMPLANT
GOWN BRE IMP SLV AUR XL STRL (GOWN DISPOSABLE) ×3 IMPLANT
GOWN STRL REIN 2XL LVL4 (GOWN DISPOSABLE) ×1 IMPLANT
HEMOSTAT POWDER KIT SURGIFOAM (HEMOSTASIS) ×2 IMPLANT
KIT BASIN OR (CUSTOM PROCEDURE TRAY) ×2 IMPLANT
KIT ROOM TURNOVER OR (KITS) ×2 IMPLANT
NDL HYPO 25X1 1.5 SAFETY (NEEDLE) ×1 IMPLANT
NDL SPNL 20GX3.5 QUINCKE YW (NEEDLE) ×1 IMPLANT
NEEDLE HYPO 25X1 1.5 SAFETY (NEEDLE) ×2 IMPLANT
NEEDLE SPNL 20GX3.5 QUINCKE YW (NEEDLE) ×2 IMPLANT
NS IRRIG 1000ML POUR BTL (IV SOLUTION) ×2 IMPLANT
PACK LAMINECTOMY NEURO (CUSTOM PROCEDURE TRAY) ×2 IMPLANT
PAD ARMBOARD 7.5X6 YLW CONV (MISCELLANEOUS) ×2 IMPLANT
PLATE 20MM (Plate) ×1 IMPLANT
PUTTY BONE DBX 2.5 MIS (Bone Implant) ×1 IMPLANT
RUBBERBAND STERILE (MISCELLANEOUS) ×4 IMPLANT
SCREW HELIX R SELF TAP 11MM (Screw) ×4 IMPLANT
SPONGE GAUZE 4X4 12PLY (GAUZE/BANDAGES/DRESSINGS) ×1 IMPLANT
SPONGE INTESTINAL PEANUT (DISPOSABLE) ×2 IMPLANT
SPONGE SURGIFOAM ABS GEL SZ50 (HEMOSTASIS) ×2 IMPLANT
STRIP CLOSURE SKIN 1/2X4 (GAUZE/BANDAGES/DRESSINGS) ×2 IMPLANT
SUT VIC AB 3-0 SH 8-18 (SUTURE) ×3 IMPLANT
SYR 20ML ECCENTRIC (SYRINGE) ×2 IMPLANT
TOWEL OR 17X24 6PK STRL BLUE (TOWEL DISPOSABLE) ×2 IMPLANT
TOWEL OR 17X26 10 PK STRL BLUE (TOWEL DISPOSABLE) ×2 IMPLANT
TRAP SPECIMEN MUCOUS 40CC (MISCELLANEOUS) ×1 IMPLANT
WATER STERILE IRR 1000ML POUR (IV SOLUTION) ×2 IMPLANT

## 2011-11-30 NOTE — Anesthesia Postprocedure Evaluation (Signed)
  Anesthesia Post-op Note  Patient: [name]  Procedure(s) Performed: Procedure(s) (LRB) with comments: ANTERIOR CERVICAL DECOMPRESSION/DISCECTOMY FUSION 1 LEVEL (N/A) - Anterior Cervical Decompression Discectomy Cervical Five-Six  Patient Location: PACU  Anesthesia Type: General  Level of Consciousness: awake  Airway and Oxygen Therapy: Patient Spontanous Breathing  Post-op Pain: mild  Post-op Assessment: Post-op Vital signs reviewed  Post-op Vital Signs: Reviewed  Complications: No apparent anesthesia complications

## 2011-11-30 NOTE — Progress Notes (Signed)
Subjective:  Patient anticipating surgery this morning. She has no complaints beyond the pain in the bilateral upper extremities which she states unchanged from yesterday. Objective:  Weight change:   Intake/Output Summary (Last 24 hours) at 11/30/11 1933 Last data filed at 11/30/11 1418  Gross per 24 hour  Intake   1500 ml  Output    400 ml  Net   1100 ml    General: Alert, awake, oriented x3, in no acute distress.  Vital Signs: Reviewed and stable HEENT: [place]/AT PEERL, EOMI C-collar in place. Neck: Trachea midline,  no masses, no thyromegal,y no JVD, no carotid bruit OROPHARYNX:  Moist, No exudate/ erythema/lesions.  Heart: Regular rate and rhythm, without murmurs, rubs, gallops, PMI non-displaced, no heaves or thrills on palpation.  Lungs: Clear to auscultation, no wheezing or rhonchi noted. No increased vocal fremitus resonant to percussion  Abdomen: Soft, nontender, nondistended, positive bowel sounds, no masses no hepatosplenomegaly noted..  Neuro: No focal neurological deficits noted . Musculoskeletal: No warm swelling or erythema around joints.   Lab Results: No results found for this basename: NA:2,K:2,CL:2,CO2:2,GLUCOSE:2,BUN:2,CREATININE:2,CALCIUM:2,MG:2,PHOS:2 in the last 72 hours No results found for this basename: AST:2,ALT:2,ALKPHOS:2,BILITOT:2,PROT:2,ALBUMIN:2 in the last 72 hours No results found for this basename: LIPASE:2,AMYLASE:2 in the last 72 hours No results found for this basename: WBC:2,NEUTROABS:2,HGB:2,HCT:2,MCV:2,PLT:2 in the last 72 hours No results found for this basename: CKTOTAL:3,CKMB:3,CKMBINDEX:3,TROPONINI:3 in the last 72 hours No components found with this basename: POCBNP:3 No results found for this basename: DDIMER:2 in the last 72 hours  Basename 11/29/11 1721  HGBA1C 7.5*   No results found for this basename: CHOL:2,HDL:2,LDLCALC:2,TRIG:2,CHOLHDL:2,LDLDIRECT:2 in the last 72 hours No results found for this basename:  TSH,T4TOTAL,FREET3,T3FREE,THYROIDAB in the last 72 hours No results found for this basename: VITAMINB12:2,FOLATE:2,FERRITIN:2,TIBC:2,IRON:2,RETICCTPCT:2 in the last 72 hours  Micro Results: No results found for this or any previous visit (from the past 240 hour(s)).  Studies/Results: Dg Cervical Spine 1 View  11/29/2011  *RADIOLOGY REPORT*  Clinical Data: Preop for cervical spine surgery  DG CERVICAL SPINE - 1 VIEW  Comparison: MR cervical spine of 11/24/2011  Findings: On the single lateral view obtained the cervical vertebrae are in normal alignment.  There is diffuse degenerative spurring present.  Degenerative disc disease is noted at C4-5 and C5-6 levels with loss of disc space and spurring.  No cervical spine fracture is seen.  No prevertebral soft tissue swelling is noted.  IMPRESSION: Normal alignment.  Diffuse degenerative change with degenerative disc disease particularly at C4-5 and C5-6.   Original Report Authenticated By: Juline Patch, M.D.    Ct Head Wo Contrast  11/17/2011  *RADIOLOGY REPORT*  Clinical Data:  Fall.  Head injury.  Frontal scalp hematoma and headache.  Epistaxis.  Neck pain.  CT HEAD WITHOUT CONTRAST CT CERVICAL SPINE WITHOUT CONTRAST  Technique:  Multidetector CT imaging of the head and cervical spine was performed following the standard protocol without intravenous contrast.  Multiplanar CT image reconstructions of the cervical spine were also generated.  Comparison:  Head CT on 09/05/2009  CT HEAD  Findings: There is no evidence of intracranial hemorrhage, brain edema or other signs of acute infarction.  There is no evidence of intracranial mass lesion or mass effect.  No abnormal extra-axial fluid collections are identified.  Mild cerebral atrophy and chronic small vessel disease are unchanged in appearance.  Ventricles remain stable in size.  No evidence of skull fracture.  IMPRESSION:  1.  No acute intracranial abnormality. 2.  Stable mild cerebral atrophy  and chronic  small vessel disease.  CT CERVICAL SPINE  Findings: No evidence of cervical spine fracture or subluxation.  Mild to moderate degenerative disc disease is seen from levels of C4 to C7.  Moderate facet DJD is also seen bilaterally, left side greater than right.  IMPRESSION:  1. Negative for acute cervical spine fracture or subluxation. 2.  Moderate cervical spondylosis, as described above.   Original Report Authenticated By: Danae Orleans, M.D.    Ct Cervical Spine Wo Contrast  11/17/2011  *RADIOLOGY REPORT*  Clinical Data:  Fall.  Head injury.  Frontal scalp hematoma and headache.  Epistaxis.  Neck pain.  CT HEAD WITHOUT CONTRAST CT CERVICAL SPINE WITHOUT CONTRAST  Technique:  Multidetector CT imaging of the head and cervical spine was performed following the standard protocol without intravenous contrast.  Multiplanar CT image reconstructions of the cervical spine were also generated.  Comparison:  Head CT on 09/05/2009  CT HEAD  Findings: There is no evidence of intracranial hemorrhage, brain edema or other signs of acute infarction.  There is no evidence of intracranial mass lesion or mass effect.  No abnormal extra-axial fluid collections are identified.  Mild cerebral atrophy and chronic small vessel disease are unchanged in appearance.  Ventricles remain stable in size.  No evidence of skull fracture.  IMPRESSION:  1.  No acute intracranial abnormality. 2.  Stable mild cerebral atrophy and chronic small vessel disease.  CT CERVICAL SPINE  Findings: No evidence of cervical spine fracture or subluxation.  Mild to moderate degenerative disc disease is seen from levels of C4 to C7.  Moderate facet DJD is also seen bilaterally, left side greater than right.  IMPRESSION:  1. Negative for acute cervical spine fracture or subluxation. 2.  Moderate cervical spondylosis, as described above.   Original Report Authenticated By: Danae Orleans, M.D.    Mr Cervical Spine W Wo Contrast  11/24/2011  *RADIOLOGY REPORT*   Clinical Data: Fall.  Paresthesias.  MRI CERVICAL SPINE WITHOUT AND WITH CONTRAST  Technique:  Multiplanar and multiecho pulse sequences of the cervical spine, to include the craniocervical junction and cervicothoracic junction, were obtained according to standard protocol without and with intravenous contrast.  Contrast: 15mL MULTIHANCE GADOBENATE DIMEGLUMINE 529 MG/ML IV SOLN  Comparison: CT 11/17/2011.  MRI 09/10/2009.  Findings: The foramen magnum is widely patent.  C1-2 and C2-3 are unremarkable except for mild facet degeneration.  C3-4 shows mild bulging of the disc and mild facet degeneration but no significant stenosis.  C4-5 shows mild spondylosis and facet degeneration but no significant stenosis.  There is mild foraminal narrowing on the right.  At C5-6, there is evidence of acute injury with interspinous edema and some edema of the facet joints.  There is mild prevertebral edema.  There are endplate osteophytes and chronic protrusion of disc material.  The canal is narrowed with an AP diameter of only 6 mm.  There is mild cord edema.  No evidence of cord hemorrhage.  No epidural hematoma.  No definable fracture.  At C6-7, there is spondylosis with endplate osteophytes and bulging of the disc.  No significant canal or foraminal narrowing. There is mild interspinous edema.  At C7-T1, there is mild facet degeneration but no significant stenosis.  The upper thoracic region is unremarkable.  IMPRESSION: Findings most consistent with a hyperflexion injury.  Interspinous edema at the C5-6 level and to a lesser extent at the C6-7 level. No definable fracture or subluxation.  Spondylosis at C5-6 with canal  stenosis, AP diameter being only 6 mm.  There is low-level abnormal cord edema consistent with recent cord injury.  No evidence of cord hemorrhage or epidural hematoma.  There is mild prevertebral edema.   Original Report Authenticated By: Thomasenia Sales, M.D.    Dg Hand 2 View Left  11/23/2011  *RADIOLOGY  REPORT*  Clinical Data: Left hand pain.  Fall.  LEFT HAND - 2 VIEW  Comparison: None.  Findings: No acute fracture and no dislocation.  Mild degenerative change of the IP joints.  IMPRESSION: No acute bony pathology.   Original Report Authenticated By: Donavan Burnet, M.D.    Dg Chest Port 1 View  11/18/2011  *RADIOLOGY REPORT*  Clinical Data: Altered mental status.  Fall.  Pain.  PORTABLE CHEST - 1 VIEW  Comparison: 12/07/2009  Findings: Stable appearance of postoperative changes in the mediastinum.  Shallow inspiration. The heart size and pulmonary vascularity are normal. The lungs appear clear and expanded without focal air space disease or consolidation. No blunting of the costophrenic angles.  No pneumothorax.  Mediastinal contours appear intact.  Degenerative changes in the spine and shoulders.  No significant change since previous study.  IMPRESSION: Shallow inspiration.  No evidence of active pulmonary disease.   Original Report Authenticated By: Marlon Pel, M.D.    Dg Shoulder Left Port  11/19/2011  *RADIOLOGY REPORT*  Clinical Data: 78 year old female fall 3 days ago.  Pain.  PORTABLE LEFT SHOULDER - 2+ VIEW  Comparison: None.  Findings: Portable AP and scapular Y views. No glenohumeral joint dislocation.  Left clavicle appears intact.  Proximal left humerus appears intact.  Left scapula and visualized left ribs appear grossly intact.  IMPRESSION: No acute fracture or dislocation identified about the left shoulder.   Original Report Authenticated By: Harley Hallmark, M.D.     Medications: I have reviewed the patient's current medications. Scheduled Meds:    . amLODipine  10 mg Oral QHS  . atorvastatin  20 mg Oral Daily  .  ceFAZolin (ANCEF) IV  1 g Intravenous Q8H  . docusate sodium  100 mg Oral BID  . feeding supplement  237 mL Oral BID BM  . feeding supplement  1 Container Oral BID WC  . fentaNYL      . gabapentin  300 mg Oral TID  . insulin aspart  0-5 Units Subcutaneous QHS    . insulin aspart  0-9 Units Subcutaneous TID WC  . insulin glargine  15 Units Subcutaneous Daily  . multivitamin with minerals  1 tablet Oral Daily  . polyethylene glycol  17 g Oral Daily  . sodium bicarbonate  1,300 mg Oral BID  . sodium chloride  250 mL Intravenous Once  . sodium chloride  3 mL Intravenous Q12H   Continuous Infusions:    . sodium chloride    . 0.9 % NaCl with KCl 20 mEq / L 75 mL/hr at 11/30/11 1803   PRN Meds:.acetaminophen, acetaminophen, LORazepam, menthol-cetylpyridinium, morphine injection, nitroGLYCERIN, ondansetron (ZOFRAN) IV, ondansetron, oxyCODONE-acetaminophen, phenol, sodium chloride, DISCONTD: 0.9 % irrigation (POUR BTL), DISCONTD: bacitracin irrigation, DISCONTD: bupivacaine, DISCONTD: fentaNYL, DISCONTD: hemostatic agents, DISCONTD: ondansetron (ZOFRAN) IV, DISCONTD: Surgifoam 1 Gm with Thrombin 5,000 units (5 ml) topical solution DISCONTD: thrombin Assessment/Plan: Patient Active Hospital Problem List: Fall (11/18/2011)   Assessment: UTI presumed to have been ruled her fall. The patient is seen and evaluated by physical therapy who recommended inpatient rehabilitation postsurgically. Will get CIR evaluation postsurgically.     UTI (lower urinary tract  infection) (11/18/2011)   Assessment: Urine culture was negative however patient had ordered he received antibiotics in the emergency room. Nevertheless the patient has completed treatment with Rocephin   Upper extremity weakness and pain (11/24/2011)   Assessment: Based on the MRI the patient has an acute injury at C5-6 and some evidence of central cord type injury. Patient for ACDF this morning     Dyslipidemia ()   Assessment: Continue statin.   DM (diabetes mellitus) ()   Assessment: We'll continue the patient on current dose of Lantus and switch to sliding scale to a.c. and at bedtime coverage. Hemoglobin A1c of 7.5 shows poor chronic control .    Altered mental status (11/18/2011)   Assessment:  Resolved.      LOS: 13 days

## 2011-11-30 NOTE — Anesthesia Preprocedure Evaluation (Addendum)
Anesthesia Evaluation  Patient identified by MRN, date of birth, ID band Patient awake    Reviewed: Allergy & Precautions, H&P , NPO status , Patient's Chart, lab work & pertinent test results  Airway Mallampati: II      Dental  (+) Edentulous Upper and Edentulous Lower   Pulmonary shortness of breath and with exertion,  breath sounds clear to auscultation        Cardiovascular hypertension, + CAD Rhythm:Regular Rate:Normal + Systolic murmurs    Neuro/Psych negative neurological ROS     GI/Hepatic negative GI ROS, Neg liver ROS,   Endo/Other  diabetes  Renal/GU negative Renal ROS     Musculoskeletal   Abdominal   Peds  Hematology negative hematology ROS (+)   Anesthesia Other Findings   Reproductive/Obstetrics                          Anesthesia Physical Anesthesia Plan  ASA: III  Anesthesia Plan: General   Post-op Pain Management:    Induction: Intravenous  Airway Management Planned: Oral ETT  Additional Equipment:   Intra-op Plan:   Post-operative Plan:   Informed Consent: I have reviewed the patients History and Physical, chart, labs and discussed the procedure including the risks, benefits and alternatives for the proposed anesthesia with the patient or authorized representative who has indicated his/her understanding and acceptance.     Plan Discussed with: CRNA, Anesthesiologist and Surgeon  Anesthesia Plan Comments:         Anesthesia Quick Evaluation

## 2011-11-30 NOTE — Op Note (Signed)
11/17/2011 - 11/30/2011  1:57 PM  PATIENT:  [name]  78 y.o. female  PRE-OPERATIVE DIAGNOSIS: Cervical Spondylosis with cervical spinal stenosis C5-6 with central cord injury  POST-OPERATIVE DIAGNOSIS:  Same  PROCEDURE:  1. Decomressive anterior cervical diskectomy C5-6, 2. Anterior cervical arthrodesis C5-6 utilizing a peek interbody cage packed with local autograft and morcellized allograft, 3. Anterior cervical plating C5-6 utilizing a Nuvasive plate  SURGEON:  Marikay Alar, MD  ASSISTANTS: Dr. Gerlene Fee  ANESTHESIA:   General  EBL: 20 ml  Total I/O In: 1000 [I.V.:1000] Out: 400 [Urine:300; Blood:100]  BLOOD ADMINISTERED:none  DRAINS: None   SPECIMEN:  No Specimen  INDICATION FOR PROCEDURE: This is a patient who fell and suffered a central cord type injury with some numbness and weakness and pain in the upper extremities. MRI showed severe stenosis with spondylosis at C5-6 and some signal change in the cord. I recommended decompressive surgery. Patient understood the risks, benefits, and alternatives and potential outcomes and wished to proceed.  PROCEDURE DETAILS: Patient was brought to the operating room placed under general endotracheal anesthesia. Patient was placed in the supine position on the operating room table. The neck was prepped with Duraprep and draped in a sterile fashion.   Three cc of local anesthesia was injected and a transverse incision was made on the right side of the neck.  Dissection was carried down thru the subcutaneous tissue and the platysma was  elevated, opened, and undermined with Metzenbaum scissors.  Dissection was then carried out thru an avascular plane leaving the sternocleidomastoid carotid artery and jugular vein laterally and the trachea and esophagus medially. The ventral aspect of the vertebral column was identified and a localizing x-ray was taken. The C5-6 level was identified. The anterior osteophytes were removed. The longus colli  muscles were then elevated and the retractor was placed. The annulus was incised and the disc space entered. Discectomy was performed with micro-curettes and pituitary rongeurs. I then used the high-speed drill to drill the endplates down to the level of the posterior longitudinal ligament. The drill shavings were saved in a mucous trap for later arthrodesis. The operating microscope was draped and brought into the field provided additional magnification, illumination and visualization. Discectomy was continued posteriorly thru the disc space. Posterior longitudinal ligament was opened with a nerve hook, and then removed along with disc herniation and osteophytes, decompressing the spinal canal and thecal sac. We then continued to remove osteophytic overgrowth and disc material decompressing the neural foramina and exiting nerve roots bilaterally. The scope was angled up and down to help decompress and undercut the vertebral bodies. Once the decompression was completed we could pass a nerve hook circumferentially to assure adequate decompression in the midline and in the neural foramina. So by both visualization and palpation we felt we had an adequate decompression of the neural elements. We then measured the height of the intravertebral disc space and selected a 8 millimeter Peek interbody cage packed with autograft and morcellized allograft. It was then gently positioned in the intravertebral disc space and countersunk. I then used a Nuvasive plate and placed four variable angle screws into the vertebral bodies and locked them into position. The wound was irrigated with bacitracin solution, checked for hemostasis which was established and confirmed. Once meticulous hemostasis was achieved, we then proceeded with closure. The platysma was closed with interrupted 3-0 undyed Vicryl suture, the subcuticular layer was closed with interrupted 3-0 undyed Vicryl suture. The skin edges were approximated with steristrips.  The  drapes were removed. A sterile dressing was applied. The patient was then awakened from general anesthesia and transferred to the recovery room in stable condition. At the end of the procedure all sponge, needle and instrument counts were correct.   PLAN OF CARE: Admit to inpatient   PATIENT DISPOSITION:  PACU - hemodynamically stable.   Delay start of Pharmacological VTE agent (>24hrs) due to surgical blood loss or risk of bleeding:  yes

## 2011-11-30 NOTE — Anesthesia Procedure Notes (Signed)
Procedure Name: Intubation Date/Time: 11/30/2011 12:21 PM Performed by: Sharlene Dory E Pre-anesthesia Checklist: Patient identified, Emergency Drugs available, Suction available, Patient being monitored and Timeout performed Patient Re-evaluated:Patient Re-evaluated prior to inductionOxygen Delivery Method: Circle system utilized Preoxygenation: Pre-oxygenation with 100% oxygen Intubation Type: IV induction Grade View: Grade II Tube size: 7.0 mm Number of attempts: 1 Airway Equipment and Method: Video-laryngoscopy Placement Confirmation: ETT inserted through vocal cords under direct vision,  positive ETCO2 and breath sounds checked- equal and bilateral Secured at: 21 cm Tube secured with: Tape Dental Injury: Teeth and Oropharynx as per pre-operative assessment  Difficulty Due To: Difficulty was anticipated and Difficult Airway-  due to neck instability Comments: Glidescope electively used due to spinal cord injury. AOI. Grade II view. +ETCO2 & BBS=. Neck remained in neutral cervical alignment with no neck extension.

## 2011-11-30 NOTE — Preoperative (Signed)
Beta Blockers   Reason not to administer Beta Blockers:Not Applicable 

## 2011-11-30 NOTE — Transfer of Care (Signed)
Immediate Anesthesia Transfer of Care Note  Patient: [name]  Procedure(s) Performed: Procedure(s) (LRB) with comments: ANTERIOR CERVICAL DECOMPRESSION/DISCECTOMY FUSION 1 LEVEL (N/A) - Anterior Cervical Decompression Discectomy Cervical Five-Six  Patient Location: PACU  Anesthesia Type: General  Level of Consciousness: awake, alert  and oriented  Airway & Oxygen Therapy: Patient Spontanous Breathing and Patient connected to nasal cannula oxygen  Post-op Assessment: Report given to PACU RN, Post -op Vital signs reviewed and stable and Patient moving all extremities X 4  Post vital signs: Reviewed and stable  Complications: No apparent anesthesia complications

## 2011-12-01 ENCOUNTER — Inpatient Hospital Stay (HOSPITAL_COMMUNITY): Payer: Medicare Other

## 2011-12-01 DIAGNOSIS — M79603 Pain in arm, unspecified: Secondary | ICD-10-CM | POA: Diagnosis present

## 2011-12-01 LAB — GLUCOSE, CAPILLARY
Glucose-Capillary: 172 mg/dL — ABNORMAL HIGH (ref 70–99)
Glucose-Capillary: 135 mg/dL — ABNORMAL HIGH (ref 70–99)

## 2011-12-01 MED ORDER — OXYCODONE HCL 5 MG PO TABS
5.0000 mg | ORAL_TABLET | ORAL | Status: DC | PRN
  Administered 2011-12-01 – 2011-12-06 (×21): 5 mg via ORAL
  Filled 2011-12-01 (×21): qty 1

## 2011-12-01 MED ORDER — METHOCARBAMOL 500 MG PO TABS
500.0000 mg | ORAL_TABLET | Freq: Four times a day (QID) | ORAL | Status: DC | PRN
  Administered 2011-12-01 – 2011-12-06 (×10): 500 mg via ORAL
  Filled 2011-12-01 (×11): qty 1

## 2011-12-01 NOTE — Clinical Social Work Note (Signed)
Clinical Social Work  CSW was reconsulted for SNF. Pt was seen for assessment on 11/22/11. CSW met with pt, and she requested that this CSW speak with her daughter, Synetta Fail, to address discharge needs. CSW spoke with daughter, who stated that they would like CIR or for pt to discharge home with home health services. Pt's family is declining SNF at this time. CSW notified MD and RNCM. CSW is signing off as no further needs identified. Please reconsult if a need arises prior to discharge.   Dede Query, MSW, Theresia Majors [phone]

## 2011-12-01 NOTE — Progress Notes (Signed)
Occupational Therapy Evaluation Patient Details Name: [name] MRN: [id number] DOB: November 24, 1928 Today's Date: 12/01/2011 Time: 1478-2956 OT Time Calculation (min): 20 min  OT Assessment / Plan / Recommendation Clinical Impression  78 yo s/p ACDF secondary to central cord syndrome. Pt experiencing further weakness and hypersensitivity s/p surgery. R UE stronger and more functional than left. Pt will benefit from skilled OT serives to max independence with ADL and functional mobility for ADL, due to below deficits. Pt will be a good CIR candidate. Feel that pt's family will be able to provide level of assistance needed after D/C from CIR. Family is available 24/7. will follow acutely.    OT Assessment  Patient needs continued OT Services    Follow Up Recommendations  Inpatient Rehab    Barriers to Discharge None    Equipment Recommendations  Defer to next venue    Recommendations for Other Services Rehab consult  Frequency  Min 3X/week    Precautions / Restrictions Precautions Precautions: Fall;Cervical Required Braces or Orthoses: Cervical Brace (for comfort) Cervical Brace: Soft collar   Pertinent Vitals/Pain 10. nsg notified. meds given.    ADL  Eating/Feeding: Performed;Moderate assistance Where Assessed - Eating/Feeding: Bed level Grooming: Performed;Maximal assistance Where Assessed - Grooming: Supine, head of bed up Upper Body Bathing: Simulated;Maximal assistance Where Assessed - Upper Body Bathing: Supine, head of bed up Lower Body Bathing: Simulated;+1 Total assistance Where Assessed - Lower Body Bathing: Supine, head of bed up;Lean right and/or left Upper Body Dressing: Simulated;Maximal assistance Where Assessed - Upper Body Dressing: Supine, head of bed up Lower Body Dressing: Simulated;+1 Total assistance Where Assessed - Lower Body Dressing: Supine, head of bed up;Rolling right and/or left Toilet Transfer: Simulated;Moderate assistance Toilet Transfer  Method: Sit to stand;Stand pivot Transfers/Ambulation Related to ADLs: Mod A ADL Comments: Limited with all ADL. No knowledge of AE. will benefit from AE.    OT Diagnosis: Generalized weakness;Acute pain;Ataxia  OT Problem List: Decreased strength;Decreased range of motion;Decreased activity tolerance;Impaired balance (sitting and/or standing);Decreased safety awareness;Decreased knowledge of use of DME or AE;Decreased knowledge of precautions;Impaired sensation;Impaired tone;Impaired UE functional use;Pain OT Treatment Interventions: Self-care/ADL training;Therapeutic exercise;Neuromuscular education;Energy conservation;DME and/or AE instruction;Therapeutic activities;Patient/family education;Balance training   OT Goals Acute Rehab OT Goals OT Goal Formulation: With patient Time For Goal Achievement: 12/15/11 Potential to Achieve Goals: Good ADL Goals Pt Will Perform Eating: with set-up;with caregiver independent in assisting;with assist to don/doff brace/orthosis;with adaptive utensils;with cueing (comment type and amount);Supported ADL Goal: Eating - Progress: Goal set today Pt Will Perform Grooming: with min assist;with caregiver independent in assisting;Supported;with adaptive equipment;with cueing (comment type and amount);Sitting, chair ADL Goal: Grooming - Progress: Goal set today Pt Will Perform Upper Body Bathing: with min assist;with caregiver independent in assisting;Sitting, edge of bed;Supported;with adaptive equipment;with cueing (comment type and amount) ADL Goal: Upper Body Bathing - Progress: Goal set today Pt Will Perform Lower Body Bathing: with mod assist;with caregiver independent in assisting;Supine, head of bed up;Supine, rolling right and/or left;Supported;with adaptive equipment;with cueing (comment type and amount) ADL Goal: Lower Body Bathing - Progress: Goal set today Pt Will Transfer to Toilet: with min assist;with DME;Ambulation;with cueing (comment type and  amount) Pt Will Perform Toileting - Clothing Manipulation: with mod assist;Sitting on 3-in-1 or toilet;Standing;with adaptive equipment;with cueing (comment type and amount) ADL Goal: Toileting - Clothing Manipulation - Progress: Goal set today Pt Will Perform Toileting - Hygiene: with mod assist;Sit to stand from 3-in-1/toilet;Standing at 3-in-1/toilet;with cueing (comment type and amount);with adaptive equipment ADL Goal: Toileting -  Hygiene - Progress: Goal set today Arm Goals Pt Will Complete Theraputty Exer: with supervision, verbal cues required/provided;to increase strength;Bilateral upper extremities;Min resistance putty;Other (comment) (B hands) Arm Goal: Theraputty Exercises - Progress: Goal set today Additional Arm Goal #1: Pt will be Supervison with Bil UE AROM exercises as appropriate Arm Goal: Additional Goal #1 - Progress: Goal set today Additional Arm Goal #2: Pt will use adapted "pointer" to operate control with set up. Arm Goal: Additional Goal #2 - Progress: Goal set today  Visit Information  Last OT Received On: 12/01/11    Subjective Data  Subjective: I can't believe this is happening.   Prior Functioning  Vision/Perception  Home Living Lives With: Alone Available Help at Discharge: Family;Neighbor Type of Home: House Home Access: Stairs to enter Secretary/administrator of Steps: 2 Entrance Stairs-Rails: Left Home Layout: One level Bathroom Shower/Tub: Counselling psychologist: Yes How Accessible: Accessible via walker Home Adaptive Equipment: Shower chair with back Prior Function Level of Independence: Independent Able to Take Stairs?: Yes Driving: Yes Vocation: Retired Musician: No difficulties Dominant Hand: Right      Cognition  Overall Cognitive Status: Appears within functional limits for tasks assessed/performed Arousal/Alertness: Awake/alert Orientation Level: Appears intact  for tasks assessed Behavior During Session: [facility] for tasks performed    Extremity/Trunk Assessment Right Upper Extremity Assessment RUE ROM/Strength/Tone: Deficits RUE ROM/Strength/Tone Deficits: shoulder @ 3/5. elbow 3+/5 flexion, 3/5 extension. hand @ 3+/5 throughout. ataxic type movements RUE Sensation: Deficits RUE Sensation Deficits: per pt reports as abnormal. decreased lt touch RUE Coordination: Deficits RUE Coordination Deficits: ataxia. poor fine/gross motor. Poor in hand manipulation skills Left Upper Extremity Assessment LUE ROM/Strength/Tone: Deficits LUE ROM/Strength/Tone Deficits: weaker than R. shoulder 2+/5. elbow 3/5/ hand 2+/5. gross grasp release only LUE Sensation: Deficits LUE Sensation Deficits: hypersensitive to touch elbow and distally--painful LUE Coordination: Deficits LUE Coordination Deficits: poor tip pinch unable lat touch and 3 jaw chuck   Mobility  Shoulder Instructions  Bed Mobility Bed Mobility: Rolling Left;Rolling Right Rolling Right: 4: Min assist;With rail Rolling Left: 3: Mod assist Transfers Details for Transfer Assistance: Assist for balance and due to bilateral LEs tremors/weakness (L > R).  Cues for safest hand placement and sequence.       Exercise     Balance  assist required. Will further assess.   End of Session OT - End of Session Activity Tolerance: Patient limited by pain;Patient limited by fatigue Patient left: in bed;with call bell/phone within reach;with family/visitor present Nurse Communication: Patient requests pain meds  GO     Hulan Szumski,HILLARY 12/01/2011, 6:34 PM [facility], OTR/L  [phone] 12/01/2011

## 2011-12-01 NOTE — Progress Notes (Signed)
Occupational Therapy Treatment Patient Details Name: Journie Howson MRN: [id number] DOB: 07/11/1928 Today's Date: 12/01/2011 Time: 1191-4782 OT Time Calculation (min): 30 min  OT Assessment / Plan / Recommendation Comments on Treatment Session Pt upset about inability to self feed. Focus of session on comepensatory techniques and AE for feeding. Pt does well with built up tubing and use of plate guard and sippy cup with handle. Also adapted pointer in order for pt to use to change channels/operate control. Began on theraputty ex. Daughter present for session. Educated daughter on importance of allowing her mother to be as independent as possible in order to work on strengthening and coordination. Very supportive daughter.     Follow Up Recommendations  Inpatient Rehab    Barriers to Discharge  None    Equipment Recommendations  Defer to next venue    Recommendations for Other Services Rehab consult  Frequency Min 3X/week   Plan Discharge plan remains appropriate    Precautions / Restrictions Precautions Precautions: Fall;Cervical Precaution Comments: Pt with hypersensitivity to touch (pain) left arm from elbow to hand Required Braces or Orthoses: Cervical Brace Cervical Brace: Soft collar   Pertinent Vitals/Pain Having difficulty with pain control Discussed with nsg and MD. Ice used for L forearm with good results.    ADL  Eating/Feeding: Performed;Minimal assistance;Min guard;Other (comment) (using AE for feeding. built up tubing, sippy cup, plate guar) Where Assessed - Eating/Feeding: Bed level Grooming: Performed;Maximal assistance Where Assessed - Grooming: Supine, head of bed up Upper Body Bathing: Simulated;Maximal assistance Where Assessed - Upper Body Bathing: Supine, head of bed up Lower Body Bathing: Simulated;+1 Total assistance Where Assessed - Lower Body Bathing: Supine, head of bed up;Lean right and/or left Upper Body Dressing: Simulated;Maximal assistance Where  Assessed - Upper Body Dressing: Supine, head of bed up Lower Body Dressing: Simulated;+1 Total assistance Where Assessed - Lower Body Dressing: Supine, head of bed up;Rolling right and/or left Toilet Transfer: Simulated;Moderate assistance Toilet Transfer Method: Sit to stand;Stand pivot Transfers/Ambulation Related to ADLs: Mod A ADL Comments: Pt upset about not being able to self feed. Focus of session of adapting utensils and teaching compensatory techniques to increase independence wna success with slf feeding    OT Diagnosis: Generalized weakness;Acute pain;Ataxia  OT Problem List: Decreased strength;Decreased range of motion;Decreased activity tolerance;Impaired balance (sitting and/or standing);Decreased safety awareness;Decreased knowledge of use of DME or AE;Decreased knowledge of precautions;Impaired sensation;Impaired tone;Impaired UE functional use;Pain OT Treatment Interventions: Self-care/ADL training;Therapeutic exercise;Neuromuscular education;Energy conservation;DME and/or AE instruction;Therapeutic activities;Patient/family education;Balance training   OT Goals Acute Rehab OT Goals OT Goal Formulation: With patient Time For Goal Achievement: 12/15/11 Potential to Achieve Goals: Good ADL Goals Pt Will Perform Eating: with set-up;with caregiver independent in assisting;with assist to don/doff brace/orthosis;with adaptive utensils;with cueing (comment type and amount);Supported ADL Goal: Eating - Progress: Progressing toward goals Pt Will Perform Grooming: with min assist;with caregiver independent in assisting;Supported;with adaptive equipment;with cueing (comment type and amount);Sitting, chair ADL Goal: Grooming - Progress: Goal set today Pt Will Perform Upper Body Bathing: with min assist;with caregiver independent in assisting;Sitting, edge of bed;Supported;with adaptive equipment;with cueing (comment type and amount) ADL Goal: Upper Body Bathing - Progress: Goal set  today Pt Will Perform Lower Body Bathing: with mod assist;with caregiver independent in assisting;Supine, head of bed up;Supine, rolling right and/or left;Supported;with adaptive equipment;with cueing (comment type and amount) ADL Goal: Lower Body Bathing - Progress: Goal set today Pt Will Transfer to Toilet: with min assist;with DME;Ambulation;with cueing (comment type and amount) Pt Will Perform Toileting -  Clothing Manipulation: with mod assist;Sitting on 3-in-1 or toilet;Standing;with adaptive equipment;with cueing (comment type and amount) ADL Goal: Toileting - Clothing Manipulation - Progress: Goal set today Pt Will Perform Toileting - Hygiene: with mod assist;Sit to stand from 3-in-1/toilet;Standing at 3-in-1/toilet;with cueing (comment type and amount);with adaptive equipment ADL Goal: Toileting - Hygiene - Progress: Goal set today Arm Goals Pt Will Complete Theraputty Exer: with supervision, verbal cues required/provided;to increase strength;Bilateral upper extremities;Min resistance putty;Other (comment) Arm Goal: Theraputty Exercises - Progress: Progressing toward goal Additional Arm Goal #1: Pt will be Supervison with Bil UE AROM exercises as appropriate Arm Goal: Additional Goal #1 - Progress: Progressing toward goals Additional Arm Goal #2: Pt will use adapted "pointer" to operate control with set up. Arm Goal: Additional Goal #2 - Progress: Progressing toward goals  Visit Information  Last OT Received On: 12/01/11    Subjective Data  Subjective: I can't believe this is happening.   Prior Functioning  Home Living Lives With: Alone Available Help at Discharge: Family;Neighbor Type of Home: House Home Access: Stairs to enter Secretary/administrator of Steps: 2 Entrance Stairs-Rails: Left Home Layout: One level Bathroom Shower/Tub: Counselling psychologist: Yes How Accessible: Accessible via walker Home Adaptive Equipment:  Shower chair with back Prior Function Level of Independence: Independent Able to Take Stairs?: Yes Driving: Yes Vocation: Retired Musician: No difficulties Dominant Hand: Right    Cognition  Overall Cognitive Status: Appears within functional limits for tasks assessed/performed Arousal/Alertness: Awake/alert Orientation Level: Appears intact for tasks assessed Behavior During Session: [facility] for tasks performed    Mobility  Shoulder Instructions Bed Mobility Bed Mobility: Rolling Left;Rolling Right Rolling Right: 4: Min assist;With rail Rolling Left: 3: Mod assist Details for Bed Mobility Assistance: total A to scoot up in bed. Educatin gpt to use bridging to assist Transfers Details for Transfer Assistance: Assist for balance and due to bilateral LEs tremors/weakness (L > R).  Cues for safest hand placement and sequence.       Exercises  Hand Exercises Forearm Supination: AROM;Both;10 reps;Supine Forearm Pronation: AROM;Both;5 reps;Supine Wrist Flexion: AROM;Self ROM;Both;5 reps Wrist Extension: AROM;Strengthening;Both;5 reps Digit Composite Flexion: AROM;10 reps;Both;Strengthening Composite Extension: AROM;Self ROM;Both;5 reps;Strengthening Other Exercises Other Exercises: theraputty light resistance givebn   Balance     End of Session OT - End of Session Activity Tolerance: Patient limited by pain;Patient limited by fatigue Patient left: in bed;with call bell/phone within reach;with family/visitor present Nurse Communication: Other (comment) (need for saft touch call bell)  GO     Janeen Watson,HILLARY 12/01/2011, 6:46 PM [facility] OTR/L  [phone] 12/01/2011

## 2011-12-01 NOTE — Progress Notes (Signed)
Patient ID: [name], female   DOB: 10-09-1928, 78 y.o.   MRN: [id number] CT scan of cervical spine reviewed. I see no evidence of epidural hematoma or complication. The canal looks well decompressed. The graft and screws and plate were in good position.

## 2011-12-01 NOTE — Progress Notes (Signed)
Patient ID: [name], female   DOB: June 09, 1928, 78 y.o.   MRN: [id number] Patient seems to be a little bit weaker postop than she was preop in her right upper extremity. Still has neuropathic pain in the left upper extremity and seems weak in both arms left greater than right similar to preop. Handgrip about 4-5, remainder is about 4 or 5 of the right upper extremity. PT note read. I will get a CT scan of the cervical spine to make sure there is no hematoma, I think she will tolerate this better than an MRI.

## 2011-12-01 NOTE — Progress Notes (Signed)
Physical Therapy Treatment Patient Details Name: [name] MRN: [id number] DOB: July 07, 1928 Today's Date: 12/01/2011 Time: 7829-5621 PT Time Calculation (min): 23 min  PT Assessment / Plan / Recommendation Comments on Treatment Session  Pt admitted and now s/p C5-6 ACDF 12/01/11.  Re-evaluation completed with bilateral weakness noted (L > R).  Pt able to be motivated to attempt mobility with PT.  Limited by pain.  Goals and d/c recommendations remain appropriate.    Follow Up Recommendations  Inpatient Rehab    Barriers to Discharge        Equipment Recommendations  Defer to next venue    Recommendations for Other Services    Frequency Min 3X/week   Plan Discharge plan remains appropriate;Frequency remains appropriate    Precautions / Restrictions Precautions Precautions: Fall;Cervical Required Braces or Orthoses: Cervical Brace Cervical Brace: Soft collar (PRN for comfort) Restrictions Weight Bearing Restrictions: No   Pertinent Vitals/Pain 5/10 in neck radiating to left UE.  RN aware and pt repositioned.    Mobility  Bed Mobility Bed Mobility: Rolling Right;Right Sidelying to Sit Rolling Right: 4: Min assist Right Sidelying to Sit: 1: +2 Total assist;HOB flat Right Sidelying to Sit: Patient Percentage: 60% Details for Bed Mobility Assistance: Assist to facilitate rotation of trunk and pelvis to sidelying and translate trunk to midline from side.  Cues for sequence and to attempt with use of bilateral UEs. Transfers Transfers: Sit to Stand;Stand to Sit (2 trials.) Sit to Stand: 1: +2 Total assist;With upper extremity assist;From bed Sit to Stand: Patient Percentage: 60% Stand to Sit: 1: +2 Total assist;With upper extremity assist;To bed;To chair/3-in-1 Stand to Sit: Patient Percentage: 60% Details for Transfer Assistance: Assist for balance and due to bilateral LEs tremors/weakness (L > R).  Cues for safest hand placement and sequence. Ambulation/Gait Ambulation/Gait  Assistance: 1: +2 Total assist Ambulation/Gait: Patient Percentage: 70% Ambulation Distance (Feet): 5 Feet Assistive device: 2 person hand held assist Ambulation/Gait Assistance Details: Assist for balance due to gross instability throughout trunk and bilateral LEs with tremors/weakness noted (L > R).  Cues for tall posture and sequence. Gait Pattern: Step-to pattern;Decreased stride length;Lateral hip instability;Narrow base of support;Trunk flexed Stairs: No Wheelchair Mobility Wheelchair Mobility: No    Exercises     PT Diagnosis:    PT Problem List:   PT Treatment Interventions:     PT Goals Acute Rehab PT Goals PT Goal Formulation: With patient Time For Goal Achievement: 12/04/11 Potential to Achieve Goals: Good PT Goal: Supine/Side to Sit - Progress: Progressing toward goal PT Goal: Sit to Stand - Progress: Progressing toward goal PT Goal: Stand to Sit - Progress: Progressing toward goal PT Goal: Ambulate - Progress: Progressing toward goal  Visit Information  Last PT Received On: 12/01/11 Assistance Needed: +2    Subjective Data  Subjective: "I just don't know how much I can do today." Patient Stated Goal: less pain   Cognition  Overall Cognitive Status: Appears within functional limits for tasks assessed/performed Arousal/Alertness: Awake/alert Orientation Level: Appears intact for tasks assessed Behavior During Session: [facility] for tasks performed    Balance  Balance Balance Assessed: Yes Static Sitting Balance Static Sitting - Balance Support: Bilateral upper extremity supported;Feet supported Static Sitting - Level of Assistance: 3: Mod assist (Up to mod assist due to posterior lean initially.) Static Sitting - Comment/# of Minutes: Pt sat EOB for 8 minutes total with up to mod assist due to inital posterior lean.  Pt able to progress to min assist with shifting weight  anterior over BOS.  End of Session PT - End of Session Equipment Utilized During Treatment:  Gait belt;Cervical collar Activity Tolerance: Patient tolerated treatment well;Patient limited by pain Patient left: in chair;with call bell/phone within reach Nurse Communication: Mobility status   GP     Cephus Shelling 12/01/2011, 9:21 AM  12/01/2011 Cephus Shelling, PT, DPT [phone]

## 2011-12-01 NOTE — Progress Notes (Signed)
Physical medicine and rehabilitation consult requested. Patient admitted 11/18/2011 after a fall with findings of cervical spondylosis with radiculopathy central cord injury was noted upper extremity weakness. Underwent decompressive anterior cervical discectomy C5-6 11/30/2011. Awaiting for followup of physical therapy and occupational therapy evaluations after surgery. Patient with noted increased weakness postoperatively with followup x-rays and imaging without signs of hematoma. Discussed our role of inpatient rehabilitation services. Patient requested to hold on formal consult at this time and a followup after feeling better to help to establish possible need for inpatient rehabilitation services. Will followup Monday, 12/04/2011 in regards to formal consult.

## 2011-12-01 NOTE — Progress Notes (Signed)
Subjective:  Patient states that the pins and needles in her bilateral upper extremities are worse than prior surgery. She's also complaining of significant pain in the left forearm which is suicidal when she fell. The patient mostly complains of a dysesthesia with any type of tactile stimulus and an ongoing constant dull pain in the left forearm. She is unable to quantify the intensity of the pain and unable to identify any palliative or provocative features.  Objective:  Weight change:  No intake or output data in the 24 hours ending 12/01/11 1827  General: Alert, awake, oriented x3, in no acute distress.  Vital Signs: Reviewed and stable HEENT: Rains/AT PEERL, EOMI C-collar in place. Neck: Trachea midline,  no masses, no thyromegal,y no JVD, no carotid bruit OROPHARYNX:  Moist, No exudate/ erythema/lesions.  Heart: Regular rate and rhythm, without murmurs, rubs, gallops, PMI non-displaced, no heaves or thrills on palpation.  Lungs: Clear to auscultation, no wheezing or rhonchi noted. No increased vocal fremitus resonant to percussion  Abdomen: Soft, nontender, nondistended, positive bowel sounds, no masses no hepatosplenomegaly noted..  Neuro: No focal neurological deficits noted . Musculoskeletal: Patient with tenderness with any touch to the left forearm. She also has a mild amount of swelling of the left forearm however there is no warmth or erythema noted.   Lab Results:   Basename 11/29/11 1721  HGBA1C 7.5*    Micro Results: No results found for this or any previous visit (from the past 240 hour(s)).  Studies/Results: Dg Cervical Spine 1 View  11/29/2011  *RADIOLOGY REPORT*  Clinical Data: Preop for cervical spine surgery  DG CERVICAL SPINE - 1 VIEW  Comparison: MR cervical spine of 11/24/2011  Findings: On the single lateral view obtained the cervical vertebrae are in normal alignment.  There is diffuse degenerative spurring present.  Degenerative disc disease is noted at C4-5  and C5-6 levels with loss of disc space and spurring.  No cervical spine fracture is seen.  No prevertebral soft tissue swelling is noted.  IMPRESSION: Normal alignment.  Diffuse degenerative change with degenerative disc disease particularly at C4-5 and C5-6.   Original Report Authenticated By: Juline Patch, M.D.    Ct Head Wo Contrast  11/17/2011  *RADIOLOGY REPORT*  Clinical Data:  Fall.  Head injury.  Frontal scalp hematoma and headache.  Epistaxis.  Neck pain.  CT HEAD WITHOUT CONTRAST CT CERVICAL SPINE WITHOUT CONTRAST  Technique:  Multidetector CT imaging of the head and cervical spine was performed following the standard protocol without intravenous contrast.  Multiplanar CT image reconstructions of the cervical spine were also generated.  Comparison:  Head CT on 09/05/2009  CT HEAD  Findings: There is no evidence of intracranial hemorrhage, brain edema or other signs of acute infarction.  There is no evidence of intracranial mass lesion or mass effect.  No abnormal extra-axial fluid collections are identified.  Mild cerebral atrophy and chronic small vessel disease are unchanged in appearance.  Ventricles remain stable in size.  No evidence of skull fracture.  IMPRESSION:  1.  No acute intracranial abnormality. 2.  Stable mild cerebral atrophy and chronic small vessel disease.  CT CERVICAL SPINE  Findings: No evidence of cervical spine fracture or subluxation.  Mild to moderate degenerative disc disease is seen from levels of C4 to C7.  Moderate facet DJD is also seen bilaterally, left side greater than right.  IMPRESSION:  1. Negative for acute cervical spine fracture or subluxation. 2.  Moderate cervical spondylosis, as described above.  Original Report Authenticated By: Danae Orleans, M.D.    Ct Cervical Spine Wo Contrast  11/17/2011  *RADIOLOGY REPORT*  Clinical Data:  Fall.  Head injury.  Frontal scalp hematoma and headache.  Epistaxis.  Neck pain.  CT HEAD WITHOUT CONTRAST CT CERVICAL SPINE  WITHOUT CONTRAST  Technique:  Multidetector CT imaging of the head and cervical spine was performed following the standard protocol without intravenous contrast.  Multiplanar CT image reconstructions of the cervical spine were also generated.  Comparison:  Head CT on 09/05/2009  CT HEAD  Findings: There is no evidence of intracranial hemorrhage, brain edema or other signs of acute infarction.  There is no evidence of intracranial mass lesion or mass effect.  No abnormal extra-axial fluid collections are identified.  Mild cerebral atrophy and chronic small vessel disease are unchanged in appearance.  Ventricles remain stable in size.  No evidence of skull fracture.  IMPRESSION:  1.  No acute intracranial abnormality. 2.  Stable mild cerebral atrophy and chronic small vessel disease.  CT CERVICAL SPINE  Findings: No evidence of cervical spine fracture or subluxation.  Mild to moderate degenerative disc disease is seen from levels of C4 to C7.  Moderate facet DJD is also seen bilaterally, left side greater than right.  IMPRESSION:  1. Negative for acute cervical spine fracture or subluxation. 2.  Moderate cervical spondylosis, as described above.   Original Report Authenticated By: Danae Orleans, M.D.    Mr Cervical Spine W Wo Contrast  11/24/2011  *RADIOLOGY REPORT*  Clinical Data: Fall.  Paresthesias.  MRI CERVICAL SPINE WITHOUT AND WITH CONTRAST  Technique:  Multiplanar and multiecho pulse sequences of the cervical spine, to include the craniocervical junction and cervicothoracic junction, were obtained according to standard protocol without and with intravenous contrast.  Contrast: 15mL MULTIHANCE GADOBENATE DIMEGLUMINE 529 MG/ML IV SOLN  Comparison: CT 11/17/2011.  MRI 09/10/2009.  Findings: The foramen magnum is widely patent.  C1-2 and C2-3 are unremarkable except for mild facet degeneration.  C3-4 shows mild bulging of the disc and mild facet degeneration but no significant stenosis.  C4-5 shows mild  spondylosis and facet degeneration but no significant stenosis.  There is mild foraminal narrowing on the right.  At C5-6, there is evidence of acute injury with interspinous edema and some edema of the facet joints.  There is mild prevertebral edema.  There are endplate osteophytes and chronic protrusion of disc material.  The canal is narrowed with an AP diameter of only 6 mm.  There is mild cord edema.  No evidence of cord hemorrhage.  No epidural hematoma.  No definable fracture.  At C6-7, there is spondylosis with endplate osteophytes and bulging of the disc.  No significant canal or foraminal narrowing. There is mild interspinous edema.  At C7-T1, there is mild facet degeneration but no significant stenosis.  The upper thoracic region is unremarkable.  IMPRESSION: Findings most consistent with a hyperflexion injury.  Interspinous edema at the C5-6 level and to a lesser extent at the C6-7 level. No definable fracture or subluxation.  Spondylosis at C5-6 with canal stenosis, AP diameter being only 6 mm.  There is low-level abnormal cord edema consistent with recent cord injury.  No evidence of cord hemorrhage or epidural hematoma.  There is mild prevertebral edema.   Original Report Authenticated By: Thomasenia Sales, M.D.    Dg Hand 2 View Left  11/23/2011  *RADIOLOGY REPORT*  Clinical Data: Left hand pain.  Fall.  LEFT HAND -  2 VIEW  Comparison: None.  Findings: No acute fracture and no dislocation.  Mild degenerative change of the IP joints.  IMPRESSION: No acute bony pathology.   Original Report Authenticated By: Donavan Burnet, M.D.    Dg Chest Port 1 View  11/18/2011  *RADIOLOGY REPORT*  Clinical Data: Altered mental status.  Fall.  Pain.  PORTABLE CHEST - 1 VIEW  Comparison: 12/07/2009  Findings: Stable appearance of postoperative changes in the mediastinum.  Shallow inspiration. The heart size and pulmonary vascularity are normal. The lungs appear clear and expanded without focal air space disease or  consolidation. No blunting of the costophrenic angles.  No pneumothorax.  Mediastinal contours appear intact.  Degenerative changes in the spine and shoulders.  No significant change since previous study.  IMPRESSION: Shallow inspiration.  No evidence of active pulmonary disease.   Original Report Authenticated By: Marlon Pel, M.D.    Dg Shoulder Left Port  11/19/2011  *RADIOLOGY REPORT*  Clinical Data: 78 year old female fall 3 days ago.  Pain.  PORTABLE LEFT SHOULDER - 2+ VIEW  Comparison: None.  Findings: Portable AP and scapular Y views. No glenohumeral joint dislocation.  Left clavicle appears intact.  Proximal left humerus appears intact.  Left scapula and visualized left ribs appear grossly intact.  IMPRESSION: No acute fracture or dislocation identified about the left shoulder.   Original Report Authenticated By: Harley Hallmark, M.D.     Medications: I have reviewed the patient's current medications. Scheduled Meds:    . amLODipine  10 mg Oral QHS  . atorvastatin  20 mg Oral Daily  .  ceFAZolin (ANCEF) IV  1 g Intravenous Q8H  . docusate sodium  100 mg Oral BID  . feeding supplement  237 mL Oral BID BM  . feeding supplement  1 Container Oral BID WC  . fentaNYL      . gabapentin  300 mg Oral TID  . insulin aspart  0-5 Units Subcutaneous QHS  . insulin aspart  0-9 Units Subcutaneous TID WC  . insulin glargine  15 Units Subcutaneous Daily  . multivitamin with minerals  1 tablet Oral Daily  . polyethylene glycol  17 g Oral Daily  . sodium bicarbonate  1,300 mg Oral BID  . sodium chloride  250 mL Intravenous Once  . sodium chloride  3 mL Intravenous Q12H   Continuous Infusions:    . sodium chloride    . 0.9 % NaCl with KCl 20 mEq / L 75 mL/hr at 11/30/11 1803   PRN Meds:.acetaminophen, acetaminophen, LORazepam, menthol-cetylpyridinium, methocarbamol, morphine injection, nitroGLYCERIN, ondansetron (ZOFRAN) IV, ondansetron, oxyCODONE, phenol, sodium chloride, traMADol,  DISCONTD: oxyCODONE-acetaminophen Assessment/Plan: Patient Active Hospital Problem List: Upper extremity weakness and pain (11/24/2011)   Assessment: Weakness and neurologic pain has been assessed by Dr. Yetta Barre of neurology. However in light of the fact the patient fell on her left side is complaining of left forearm pain I will obtain an x-ray of the left forearm to ensure that there's not an occult fracture that has been undiagnosed.  Fall (11/18/2011)   Assessment: UTI presumed to have been ruled her fall. The patient is seen and evaluated by physical therapy who recommended inpatient rehabilitation postsurgically. Will get CIR evaluation postsurgically.     UTI (lower urinary tract infection) (11/18/2011)   Assessment: Urine culture was negative however patient had ordered he received antibiotics in the emergency room. Nevertheless the patient has completed treatment with Rocephin   Dyslipidemia ()   Assessment: Continue statin.  DM (diabetes mellitus) ()   Assessment: Blood sugars remained well controlled during his hospitalization. Hemoglobin A1c of 7.5 shows poor chronic control .    Altered mental status (11/18/2011)   Assessment: Resolved.      LOS: 14 days

## 2011-12-02 ENCOUNTER — Inpatient Hospital Stay (HOSPITAL_COMMUNITY): Payer: Medicare Other

## 2011-12-02 DIAGNOSIS — M79609 Pain in unspecified limb: Secondary | ICD-10-CM

## 2011-12-02 LAB — GLUCOSE, CAPILLARY
Glucose-Capillary: 152 mg/dL — ABNORMAL HIGH (ref 70–99)
Glucose-Capillary: 212 mg/dL — ABNORMAL HIGH (ref 70–99)

## 2011-12-02 MED ORDER — LORAZEPAM 0.5 MG PO TABS
0.5000 mg | ORAL_TABLET | Freq: Three times a day (TID) | ORAL | Status: DC | PRN
  Administered 2011-12-03 – 2011-12-06 (×5): 0.5 mg via ORAL
  Filled 2011-12-02 (×5): qty 1

## 2011-12-02 NOTE — Progress Notes (Signed)
Subjective: Patient reports Reports generally feeling poorly. Complains of difficulty breathing. Complains of left upper extremity shoulder and neck pain and pins and needles all over  Objective: Vital signs in last 24 hours: Temp:  [98.5 F (36.9 C)-100.3 F (37.9 C)] 100 F (37.8 C) (09/21 0611) Pulse Rate:  [85-93] 85  (09/21 0611) Resp:  [18-20] 18  (09/21 0611) BP: (104-135)/(46-56) 104/47 mmHg (09/21 0611) SpO2:  [91 %-100 %] 91 % (09/21 0611)  Intake/Output from previous day:   Intake/Output this shift:    Vital signs are stable patient oxygenating well breathing 16 times per minute without effort or stridor sensitive to touch in the left upper extremity. Does have motor function that is at least 4/5 in deltoid  Lab Results: No results found for this basename: WBC:2,HGB:2,HCT:2,PLT:2 in the last 72 hours BMET No results found for this basename: NA:2,K:2,CL:2,CO2:2,GLUCOSE:2,BUN:2,CREATININE:2,CALCIUM:2 in the last 72 hours  Studies/Results: Dg Cervical Spine 1 View  11/30/2011  *RADIOLOGY REPORT*  Clinical Data: fusion  DG C-ARM 1-60 MIN,DG CERVICAL SPINE - 1 VIEW  Comparison:   the previous day's study  Findings: Single lateral intraoperative cervical radiograph demonstrate changes of instrumented ACDF C5-6.  The cervicothoracic junction is not visualized.  IMPRESSION:  1.  ACDF C5-6   Original Report Authenticated By: Osa Craver, M.D.    Dg Forearm Left  12/01/2011  *RADIOLOGY REPORT*  Clinical Data: Pain post trauma  LEFT FOREARM - 2 VIEW  Comparison: None.  Findings: Frontal and lateral views were obtained.  There is no fracture or dislocation.  Joint spaces appear intact.  There is no abnormal periosteal reaction.  IMPRESSION: No abnormality noted.   Original Report Authenticated By: Arvin Collard. WOODRUFF III, M.D.    Ct Cervical Spine Wo Contrast  12/01/2011  *RADIOLOGY REPORT*  Clinical Data: Left-sided neck shoulder arm pain and weakness. Anterior cervical  fusion at C5-6 on 11/30/2011  CT CERVICAL SPINE WITHOUT CONTRAST  Technique:  Multidetector CT imaging of the cervical spine was performed. Multiplanar CT image reconstructions were also generated.  Comparison: CT scan of the cervical spine dated 11/17/2011 and cervical MRI dated 11/24/2011  Findings: The patient has undergone anterior cervical fusion at C5- 6.  Screws, anterior plate, and interbody fusion device appear in excellent position.  There is no evidence of an epidural hematoma. Spinal canal is decompressed.  There is some streak artifact in the spinal canal at the operative level caused by the hardware.  The patient does have a fracture through the left vertebral foramen at C5 as well as a vertical fracture through the left side of the spinous process of C5.  This fracture does not extend through the lamina.  There is postsurgical air in the prevertebral soft tissues to the expected degree.  The remainder of the cervical spine is stable since the prior CT scan.  There is auto fusion of the left lateral masses at C4-5.  IMPRESSION:  1.  Good decompression of the spinal canal at C5-6 after anterior cervical fusion.  No evidence of epidural hematoma or other acute neural compression. 2.  The patient does have hairline fractures through the left vertebral foramen at C5 and through the left side of the spinous processes C5.  These are essentially unchanged with benefit of retrospection since the prior study although better seen on today's exam.   Original Report Authenticated By: Gwynn Burly, M.D.    Dg C-arm 1-60 Min  11/30/2011  *RADIOLOGY REPORT*  Clinical Data: fusion  DG C-ARM 1-60 MIN,DG CERVICAL SPINE - 1 VIEW  Comparison:   the previous day's study  Findings: Single lateral intraoperative cervical radiograph demonstrate changes of instrumented ACDF C5-6.  The cervicothoracic junction is not visualized.  IMPRESSION:  1.  ACDF C5-6   Original Report Authenticated By: Osa Craver, M.D.       Assessment/Plan: Stable postop patient making slow recovery continue to observe  LOS: 15 days  Observation and supportive care for the present time.   Madeeha Costantino J 12/02/2011, 8:25 AM

## 2011-12-02 NOTE — Progress Notes (Signed)
[facility]             Progress Note   Subjective: Had Surgery last Thursday. Patient still complains of weakness and pain L>R UE. However, per my exam she actually appears stronger than she did earlier last week. She is moving her right arm around as she talks, and she now allows touch to her left hand/arm without extreme pain. Left forearm Xray reviewed and without fractures. She is now willing to consider CIR evaluation. Will see if they can consult today as we are not providing much in the way of acute care at this point.  Objective: Vital signs in last 24 hours: Temp:  [98.2 F (36.8 C)-100.3 F (37.9 C)] 98.2 F (36.8 C) (09/21 0901) Pulse Rate:  [76-93] 76  (09/21 0901) Resp:  [18-20] 18  (09/21 0901) BP: (79-135)/(35-56) 111/55 mmHg (09/21 0903) SpO2:  [91 %-100 %] 94 % (09/21 0901) Weight change:  Last BM Date: 11/29/11  Intake/Output from previous day:       Physical Exam: General: Alert, awake, oriented x3. HEENT: No bruits, no goiter. Heart: Regular rate and rhythm, has a SEM. Lungs: Clear to auscultation bilaterally. Abdomen: Soft, nontender, nondistended, positive bowel sounds. Extremities: No clubbing cyanosis or edema with positive pedal pulses. No upper extremity deformities noted.    Lab Results: CBG:  Basename 12/02/11 1610 12/01/11 2205 12/01/11 1653 12/01/11 1306 12/01/11 0641 11/30/11 2051  GLUCAP 152* 135* 172* 147* 135* 188*   Urine Drug Screen: Drugs of Abuse     Component Value Date/Time   LABOPIA  Value: POSITIVE (NOTE) Result repeated and verified. Sent for confirmatory testing* 09/06/2009 0037   COCAINSCRNUR NEGATIVE 09/06/2009 0037   LABBENZ NEGATIVE 09/06/2009 0037   AMPHETMU NEGATIVE 09/06/2009 0037      Studies/Results: Dg Cervical Spine 1 View  11/30/2011  *RADIOLOGY REPORT*  Clinical Data: fusion  DG C-ARM 1-60 MIN,DG CERVICAL SPINE - 1 VIEW  Comparison:   the previous day's study  Findings: Single lateral  intraoperative cervical radiograph demonstrate changes of instrumented ACDF C5-6.  The cervicothoracic junction is not visualized.  IMPRESSION:  1.  ACDF C5-6   Original Report Authenticated By: Osa Craver, M.D.    Dg Forearm Left  12/01/2011  *RADIOLOGY REPORT*  Clinical Data: Pain post trauma  LEFT FOREARM - 2 VIEW  Comparison: None.  Findings: Frontal and lateral views were obtained.  There is no fracture or dislocation.  Joint spaces appear intact.  There is no abnormal periosteal reaction.  IMPRESSION: No abnormality noted.   Original Report Authenticated By: Arvin Collard. WOODRUFF III, M.D.    Ct Cervical Spine Wo Contrast  12/01/2011  *RADIOLOGY REPORT*  Clinical Data: Left-sided neck shoulder arm pain and weakness. Anterior cervical fusion at C5-6 on 11/30/2011  CT CERVICAL SPINE WITHOUT CONTRAST  Technique:  Multidetector CT imaging of the cervical spine was performed. Multiplanar CT image reconstructions were also generated.  Comparison: CT scan of the cervical spine dated 11/17/2011 and cervical MRI dated 11/24/2011  Findings: The patient has undergone anterior cervical fusion at C5- 6.  Screws, anterior plate, and interbody fusion device appear in excellent position.  There is no evidence of an epidural hematoma. Spinal canal is decompressed.  There is some streak artifact in the spinal canal at the operative level caused by the hardware.  The patient does have a fracture through the left vertebral foramen at C5 as well as a vertical fracture through the left side of the spinous process  of C5.  This fracture does not extend through the lamina.  There is postsurgical air in the prevertebral soft tissues to the expected degree.  The remainder of the cervical spine is stable since the prior CT scan.  There is auto fusion of the left lateral masses at C4-5.  IMPRESSION:  1.  Good decompression of the spinal canal at C5-6 after anterior cervical fusion.  No evidence of epidural hematoma or  other acute neural compression. 2.  The patient does have hairline fractures through the left vertebral foramen at C5 and through the left side of the spinous processes C5.  These are essentially unchanged with benefit of retrospection since the prior study although better seen on today's exam.   Original Report Authenticated By: Gwynn Burly, M.D.    Dg C-arm 1-60 Min  11/30/2011  *RADIOLOGY REPORT*  Clinical Data: fusion  DG C-ARM 1-60 MIN,DG CERVICAL SPINE - 1 VIEW  Comparison:   the previous day's study  Findings: Single lateral intraoperative cervical radiograph demonstrate changes of instrumented ACDF C5-6.  The cervicothoracic junction is not visualized.  IMPRESSION:  1.  ACDF C5-6   Original Report Authenticated By: Osa Craver, M.D.     Medications: Scheduled Meds:    . amLODipine  10 mg Oral QHS  . atorvastatin  20 mg Oral Daily  . docusate sodium  100 mg Oral BID  . feeding supplement  237 mL Oral BID BM  . feeding supplement  1 Container Oral BID WC  . gabapentin  300 mg Oral TID  . insulin aspart  0-5 Units Subcutaneous QHS  . insulin aspart  0-9 Units Subcutaneous TID WC  . insulin glargine  15 Units Subcutaneous Daily  . multivitamin with minerals  1 tablet Oral Daily  . polyethylene glycol  17 g Oral Daily  . sodium bicarbonate  1,300 mg Oral BID  . sodium chloride  250 mL Intravenous Once  . sodium chloride  3 mL Intravenous Q12H   Continuous Infusions:    . sodium chloride    . 0.9 % NaCl with KCl 20 mEq / L 75 mL/hr at 11/30/11 1803   PRN Meds:.acetaminophen, acetaminophen, LORazepam, menthol-cetylpyridinium, methocarbamol, morphine injection, nitroGLYCERIN, ondansetron (ZOFRAN) IV, ondansetron, oxyCODONE, phenol, sodium chloride, traMADol  Assessment/Plan:  Principal Problem:  *Upper extremity weakness Active Problems:  Hypertension  Dyslipidemia  DM (diabetes mellitus)  UTI (lower urinary tract infection)  Altered mental status  Fall   Pain of upper extremity   Fall -UTI presumed to have played a role in her fall. -Continue PT. -Patient is now refusing SNF, so we will need to arrange for [facility] care upon DC vs CIR.  UTI -Culture negative, but had already received antibiotics in the ED. -Has completed treatment with rocephin.  Bilateral UE Pain and Weakness -Had decompressive surgery last Thursday. -Complained of increase pain and weakness post surgery. -Ct scan done that showed: Good decompression of the spinal canal at C5-6 after anterior  cervical fusion. No evidence of epidural hematoma or other acute  neural compression. -Continue plan as per neurosurgery. -Awaiting CIR eval.   Hyperlipidemia -Continue statin  Encephalopathy -Resolved -Presumed secondary to UTI.  DM -CBGs well controlled. -Continue lantus to 15 .  Disposition -Await CIR eval. -Hopefully they will be able to see today as we are not providing much acute care at this point. -Patient would certainly benefit from CIR prior to returning home as per PT/OT notes.     LOS: 15 days  [name] [facility] Pager: [phone] 12/02/2011, 9:32 AM

## 2011-12-03 DIAGNOSIS — R509 Fever, unspecified: Secondary | ICD-10-CM | POA: Diagnosis not present

## 2011-12-03 DIAGNOSIS — I10 Essential (primary) hypertension: Secondary | ICD-10-CM

## 2011-12-03 LAB — URINE MICROSCOPIC-ADD ON

## 2011-12-03 LAB — CBC WITH DIFFERENTIAL/PLATELET
Eosinophils Absolute: 0.1 10*3/uL (ref 0.0–0.7)
Hemoglobin: 8.8 g/dL — ABNORMAL LOW (ref 12.0–15.0)
Lymphs Abs: 1.9 10*3/uL (ref 0.7–4.0)
MCH: 30.8 pg (ref 26.0–34.0)
Monocytes Relative: 11 % (ref 3–12)
Neutro Abs: 4.9 10*3/uL (ref 1.7–7.7)
Neutrophils Relative %: 63 % (ref 43–77)
RBC: 2.86 MIL/uL — ABNORMAL LOW (ref 3.87–5.11)

## 2011-12-03 LAB — GLUCOSE, CAPILLARY
Glucose-Capillary: 171 mg/dL — ABNORMAL HIGH (ref 70–99)
Glucose-Capillary: 279 mg/dL — ABNORMAL HIGH (ref 70–99)
Glucose-Capillary: 224 mg/dL — ABNORMAL HIGH (ref 70–99)

## 2011-12-03 LAB — URINALYSIS, ROUTINE W REFLEX MICROSCOPIC
Bilirubin Urine: NEGATIVE
Glucose, UA: 500 mg/dL — AB
Hgb urine dipstick: NEGATIVE
Nitrite: NEGATIVE
Specific Gravity, Urine: 1.024 (ref 1.005–1.030)
pH: 6.5 (ref 5.0–8.0)

## 2011-12-03 LAB — LACTIC ACID, PLASMA: Lactic Acid, Venous: 1.5 mmol/L (ref 0.5–2.2)

## 2011-12-03 NOTE — Progress Notes (Signed)
Subjective: Patient reports Patient reports feeling poorly and does not want to be poked and prodded anymore. She notes that she feels ready to die and does not wish any further aggressive care  Objective: Vital signs in last 24 hours: Temp:  [98.1 F (36.7 C)-101.5 F (38.6 C)] 99.4 F (37.4 C) (09/22 1017) Pulse Rate:  [74-91] 87  (09/22 1017) Resp:  [18-20] 18  (09/22 1017) BP: (102-117)/(43-81) 114/50 mmHg (09/22 1017) SpO2:  [93 %-97 %] 93 % (09/22 1017)  Intake/Output from previous day: 09/21 0701 - 09/22 0700 In: 1320 [P.O.:1320] Out: 250 [Urine:250] Intake/Output this shift:    Painful dysesthesias and upper extremities makes movement of upper extremities difficult motor function is present  Lab Results:  Basename 12/02/11 2345  WBC 7.7  HGB 8.8*  HCT 26.7*  PLT 247   BMET No results found for this basename: NA:2,K:2,CL:2,CO2:2,GLUCOSE:2,BUN:2,CREATININE:2,CALCIUM:2 in the last 72 hours  Studies/Results: Dg Forearm Left  12/01/2011  *RADIOLOGY REPORT*  Clinical Data: Pain post trauma  LEFT FOREARM - 2 VIEW  Comparison: None.  Findings: Frontal and lateral views were obtained.  There is no fracture or dislocation.  Joint spaces appear intact.  There is no abnormal periosteal reaction.  IMPRESSION: No abnormality noted.   Original Report Authenticated By: Arvin Collard. WOODRUFF III, M.D.    Ct Cervical Spine Wo Contrast  12/01/2011  *RADIOLOGY REPORT*  Clinical Data: Left-sided neck shoulder arm pain and weakness. Anterior cervical fusion at C5-6 on 11/30/2011  CT CERVICAL SPINE WITHOUT CONTRAST  Technique:  Multidetector CT imaging of the cervical spine was performed. Multiplanar CT image reconstructions were also generated.  Comparison: CT scan of the cervical spine dated 11/17/2011 and cervical MRI dated 11/24/2011  Findings: The patient has undergone anterior cervical fusion at C5- 6.  Screws, anterior plate, and interbody fusion device appear in excellent position.   There is no evidence of an epidural hematoma. Spinal canal is decompressed.  There is some streak artifact in the spinal canal at the operative level caused by the hardware.  The patient does have a fracture through the left vertebral foramen at C5 as well as a vertical fracture through the left side of the spinous process of C5.  This fracture does not extend through the lamina.  There is postsurgical air in the prevertebral soft tissues to the expected degree.  The remainder of the cervical spine is stable since the prior CT scan.  There is auto fusion of the left lateral masses at C4-5.  IMPRESSION:  1.  Good decompression of the spinal canal at C5-6 after anterior cervical fusion.  No evidence of epidural hematoma or other acute neural compression. 2.  The patient does have hairline fractures through the left vertebral foramen at C5 and through the left side of the spinous processes C5.  These are essentially unchanged with benefit of retrospection since the prior study although better seen on today's exam.   Original Report Authenticated By: Gwynn Burly, M.D.    Dg Chest Port 1 View  12/02/2011  *RADIOLOGY REPORT*  Clinical Data: Fever  PORTABLE CHEST - 1 VIEW  Comparison: 11/17/2011  Findings: Prior CABG.  Negative for heart failure or pneumonia. Lungs are clear.  Negative for pleural effusion.  IMPRESSION: No active cardiopulmonary disease.   Original Report Authenticated By: Camelia Phenes, M.D.     Assessment/Plan: Listen to patient's desires and concerns. She has been talking with her daughters about her wishes and desires notes that one daughter  has power of attorney for her.  LOS: 16 days  Expressed support per patient's wishes and desires.   Deno Sida J 12/03/2011, 10:25 AM

## 2011-12-03 NOTE — Progress Notes (Signed)
Daughter expressing concerns re:  Patient's increasing depression.  Affect remains flat with little initiation of conversation.  Daughter asking for Ativan to be increased.   I expressed concern about the underlying cause of the depression.  Notified Dr. Ardyth Harps, who will initiate a psych. Consult.  I spoke with daughter and patient, who are in agreement with this plan of action at this time.

## 2011-12-03 NOTE — Progress Notes (Signed)
[facility]             Progress Note   Subjective: Had Surgery last Thursday. Patient still complains of weakness and pain L>R UE. However, per my exam she actually appears stronger than she did earlier last week. She is moving her right arm around as she talks, and she now allows touch to her left hand/arm without extreme pain. Left forearm Xray reviewed and without fractures. She seems very anxious and frustrated:"why would I go to rehab and continue suffering as I am suffering here? Ijust want to go home". She does however agree that she is still too weak and it would be difficult for her to perform her ADLs.  Had a fever of 101.5 last pm.  Objective: Vital signs in last 24 hours: Temp:  [98.1 F (36.7 C)-101.5 F (38.6 C)] 98.1 F (36.7 C) (09/22 0617) Pulse Rate:  [74-91] 82  (09/22 0617) Resp:  [18-20] 18  (09/22 0617) BP: (102-117)/(43-81) 106/57 mmHg (09/22 0617) SpO2:  [94 %-97 %] 97 % (09/22 0617) Weight change:  Last BM Date: 12/02/11  Intake/Output from previous day: 09/21 0701 - 09/22 0700 In: 1320 [P.O.:1320] Out: 250 [Urine:250]     Physical Exam: General: Alert, awake, oriented x3. HEENT: No bruits, no goiter. Heart: Regular rate and rhythm, has a SEM. Lungs: Clear to auscultation bilaterally. Abdomen: Soft, nontender, nondistended, positive bowel sounds. Extremities: No clubbing cyanosis or edema with positive pedal pulses. No upper extremity deformities noted.    Lab Results: CBG:  Basename 12/03/11 0640 12/02/11 2149 12/02/11 1634 12/02/11 1116 12/02/11 0642 12/01/11 2205  GLUCAP 126* 212* 198* 215* 152* 135*   Urine Drug Screen: Drugs of Abuse     Component Value Date/Time   LABOPIA  Value: POSITIVE (NOTE) Result repeated and verified. Sent for confirmatory testing* 09/06/2009 0037   COCAINSCRNUR NEGATIVE 09/06/2009 0037   LABBENZ NEGATIVE 09/06/2009 0037   AMPHETMU NEGATIVE 09/06/2009 0037      Studies/Results: Dg Forearm  Left  12/01/2011  *RADIOLOGY REPORT*  Clinical Data: Pain post trauma  LEFT FOREARM - 2 VIEW  Comparison: None.  Findings: Frontal and lateral views were obtained.  There is no fracture or dislocation.  Joint spaces appear intact.  There is no abnormal periosteal reaction.  IMPRESSION: No abnormality noted.   Original Report Authenticated By: Arvin Collard. WOODRUFF III, M.D.    Ct Cervical Spine Wo Contrast  12/01/2011  *RADIOLOGY REPORT*  Clinical Data: Left-sided neck shoulder arm pain and weakness. Anterior cervical fusion at C5-6 on 11/30/2011  CT CERVICAL SPINE WITHOUT CONTRAST  Technique:  Multidetector CT imaging of the cervical spine was performed. Multiplanar CT image reconstructions were also generated.  Comparison: CT scan of the cervical spine dated 11/17/2011 and cervical MRI dated 11/24/2011  Findings: The patient has undergone anterior cervical fusion at C5- 6.  Screws, anterior plate, and interbody fusion device appear in excellent position.  There is no evidence of an epidural hematoma. Spinal canal is decompressed.  There is some streak artifact in the spinal canal at the operative level caused by the hardware.  The patient does have a fracture through the left vertebral foramen at C5 as well as a vertical fracture through the left side of the spinous process of C5.  This fracture does not extend through the lamina.  There is postsurgical air in the prevertebral soft tissues to the expected degree.  The remainder of the cervical spine is stable since the prior CT scan.  There  is auto fusion of the left lateral masses at C4-5.  IMPRESSION:  1.  Good decompression of the spinal canal at C5-6 after anterior cervical fusion.  No evidence of epidural hematoma or other acute neural compression. 2.  The patient does have hairline fractures through the left vertebral foramen at C5 and through the left side of the spinous processes C5.  These are essentially unchanged with benefit of retrospection since  the prior study although better seen on today's exam.   Original Report Authenticated By: Gwynn Burly, M.D.    Dg Chest Port 1 View  12/02/2011  *RADIOLOGY REPORT*  Clinical Data: Fever  PORTABLE CHEST - 1 VIEW  Comparison: 11/17/2011  Findings: Prior CABG.  Negative for heart failure or pneumonia. Lungs are clear.  Negative for pleural effusion.  IMPRESSION: No active cardiopulmonary disease.   Original Report Authenticated By: Camelia Phenes, M.D.     Medications: Scheduled Meds:    . amLODipine  10 mg Oral QHS  . atorvastatin  20 mg Oral Daily  . docusate sodium  100 mg Oral BID  . feeding supplement  237 mL Oral BID BM  . feeding supplement  1 Container Oral BID WC  . gabapentin  300 mg Oral TID  . insulin aspart  0-5 Units Subcutaneous QHS  . insulin aspart  0-9 Units Subcutaneous TID WC  . insulin glargine  15 Units Subcutaneous Daily  . multivitamin with minerals  1 tablet Oral Daily  . polyethylene glycol  17 g Oral Daily  . sodium bicarbonate  1,300 mg Oral BID  . sodium chloride  250 mL Intravenous Once  . sodium chloride  3 mL Intravenous Q12H   Continuous Infusions:    . sodium chloride    . 0.9 % NaCl with KCl 20 mEq / L 75 mL/hr (12/02/11 0600)   PRN Meds:.acetaminophen, acetaminophen, LORazepam, menthol-cetylpyridinium, methocarbamol, morphine injection, nitroGLYCERIN, ondansetron (ZOFRAN) IV, ondansetron, oxyCODONE, phenol, sodium chloride, traMADol, DISCONTD: LORazepam  Assessment/Plan:  Principal Problem:  *Upper extremity weakness Active Problems:  Hypertension  Dyslipidemia  DM (diabetes mellitus)  UTI (lower urinary tract infection)  Altered mental status  Fall  Pain of upper extremity   Fall -UTI presumed to have played a role in her fall. -Continue PT. -Patient is now refusing SNF, so we will need to arrange for [facility] care upon DC vs CIR.  UTI -Culture negative, but had already received antibiotics in the ED. -Has completed treatment with  rocephin.  Fever -Had a temp of 101.5 last pm. -CXR and UA without abnormalities. -Blood cultures ordered and pending. -? Spurious. -Neurosurgery to comment on possibly source being from recent cervical decompression and fusion.  Bilateral UE Pain and Weakness -Had decompressive surgery last Thursday. -Complained of increase pain and weakness post surgery. -Ct scan done that showed: Good decompression of the spinal canal at C5-6 after anterior  cervical fusion. No evidence of epidural hematoma or other acute  neural compression. -Continue plan as per neurosurgery. -Awaiting CIR eval.   Hyperlipidemia -Continue statin  Encephalopathy -Resolved -Presumed secondary to UTI.  DM -CBGs well controlled. -Continue lantus to 15 .  Disposition -Await CIR eval. -Hopefully they will be able to see soon as we are not providing much acute care at this point. -Patient would certainly benefit from CIR prior to returning home as per PT/OT notes.     LOS: 16 days   [name],ESTELA [facility] Pager: [phone] 12/03/2011, 9:28 AM

## 2011-12-03 NOTE — Progress Notes (Signed)
[facility] progress note. Chief complaint. Fever. History of present illness. This 78 year old female had and the recent decompression of C5-6 after anterior cervical fusion. Patient also had a urinary tract infection with cultures negative and completion of treatment with Rocephin antibiotic therapy. Staff called me indicating the patient had a new fever 101.5. I ordered a septic workup and the results show no leukocytosis and normal differential, normal pro-calcitonin and lactic acid, chest x-ray that showed no acute disease. The blood cultures were also ordered and obtained with results pending. Urinalysis with microscopic exam and culture still pending. Patient has no complaints other than feeling hot. In particular she denies any chest pain, neck pain at her surgical site, or dyspnea. Vital signs. Temperature 99.5, pulse 91, respiration 18, blood pressure 111/43. O2 sats 94%. General appearance. Well-developed elderly female in no acute distress. She is alert and cooperative. Cardiac. Rate and rhythm regular with 2/6 systolic ejection murmur. Lungs. Few scattered crackles in the bases. Otherwise clear without distress or cough. Stable O2 sats. Abdomen. Soft with positive bowel sounds. No pain. Urinary genital. Patient does have some pain with palpation over the bladder. No CVA tenderness. Skin. The right neck incisional site is covered with surgical dressing. There is no surrounding erythema. I see no evidence of discharge on the dressing. Impression/plan. Problem #1. Fever unknown origin. At this point I see no clear indication of an infection. Her blood work and chest x-ray are normal. Possibly a recurrent UTI we'll follow for the urinalysis and microscopic examination. Will defer antibiotics at this point until a confirmed a source of infection is isolated. I asked staff to notify neurosurgery call and they had no further orders. Patient's fever has come down significantly status post  Tylenol.

## 2011-12-04 DIAGNOSIS — F329 Major depressive disorder, single episode, unspecified: Secondary | ICD-10-CM | POA: Diagnosis present

## 2011-12-04 LAB — URINE CULTURE: Colony Count: 2000

## 2011-12-04 LAB — GLUCOSE, CAPILLARY
Glucose-Capillary: 117 mg/dL — ABNORMAL HIGH (ref 70–99)
Glucose-Capillary: 217 mg/dL — ABNORMAL HIGH (ref 70–99)
Glucose-Capillary: 121 mg/dL — ABNORMAL HIGH (ref 70–99)

## 2011-12-04 MED ORDER — DULOXETINE HCL 30 MG PO CPEP
30.0000 mg | ORAL_CAPSULE | Freq: Every day | ORAL | Status: DC
  Administered 2011-12-04 – 2011-12-06 (×3): 30 mg via ORAL
  Filled 2011-12-04 (×4): qty 1

## 2011-12-04 NOTE — Progress Notes (Signed)
Following pt for CIR admit.  Await insurance auth & bed availability.  [phone] 

## 2011-12-04 NOTE — Consult Note (Signed)
Patient Identification:  [name] Date of Evaluation:  12/04/2011 Reason for Consult:  Evaluate Capacity  Referring Provider: Dr Janne Lab  History of Present Illness: Patient is admitted after reporting a fall. Her history is very date because she does not recall exactly what had happened. She manage to call EMS and was brought to the emergency room. She was complaining of pain all over. She lives alone and there is no other person available to provide anymore specific information. On examination she was found to have compression of C5-C6 and surgery to decompress the area was performed. She also was diagnosed with urinary tract infection that was treated. After work, she spiked a fever of 101.5 and call cultures were negative.    Past Psychiatric History: Patient reports only periodic medication for her "nerves". She is not taking any medication at this time.  Past Medical History:     Past Medical History  Diagnosis Date  . Shortness of breath   . CAD (coronary artery disease)     Last catheterization 2009, grafts patent, EF 70%, 80% small OM with possible mild ischemic  . Hx of CABG     2000, LIMA to LAD, SVG to diagonal, SVG to OM, SVG to posterior descending  . Ejection fraction     EF 70%,, 2009 /  EF 65%, echo, October, 2011  . Hypertension   . Palpitations   . Dyslipidemia   . Diabetes mellitus   . Pancreatitis   . Carotid artery disease     Mild, Doppler, 2000  . Dizziness     Evaluated April, 2008, no treatment needed  . Cervical spine disease     Severe neck pain, 2011  . Hyperkalemia     Her doctor Zachery Dauer, September, 2012, ACE inhibitor stopped, probable amlodipine to be  . Shortness of breath   . History of hysterectomy   . Chest pain     Chest hurting, May, 2013       Past Surgical History  Procedure Date  . Coronary artery bypass graft 2000  . Cardiac catheterization 2009  . Cholecystectomy     Allergies:  Allergies  Allergen Reactions  .  Codeine     Unknown    Current Medications:  Prior to Admission medications   Medication Sig Start Date End Date Taking? Authorizing Provider  aspirin 81 MG tablet Take 81 mg by mouth daily.     Yes Historical Provider, MD  atorvastatin (LIPITOR) 20 MG tablet Take 1 tablet (20 mg total) by mouth daily. 06/06/11  Yes Luis Abed, MD  clopidogrel (PLAVIX) 75 MG tablet Take 1 tablet (75 mg total) by mouth daily. 06/06/11  Yes Luis Abed, MD  furosemide (LASIX) 20 MG tablet TAKE ONE-HALF TABLET BY MOUTH EVERY DAY 10/30/11  Yes Luis Abed, MD  glyBURIDE (DIABETA) 5 MG tablet Take 5 mg by mouth 2 (two) times daily with a meal.     Yes Historical Provider, MD  metFORMIN (GLUCOPHAGE) 500 MG tablet Take 2 tabs in the AM and 1 tab in the PM   Yes Historical Provider, MD  multivitamin [facility]) per tablet Take 1 tablet by mouth daily.     Yes Historical Provider, MD  nitroGLYCERIN (NITROSTAT) 0.4 MG SL tablet Place 0.4 mg under the tongue every 5 (five) minutes as needed. For chest pain   Yes Historical Provider, MD  sitaGLIPtin (JANUVIA) 100 MG tablet Take 100 mg by mouth daily.     Yes Historical Provider, MD  sodium bicarbonate 650 MG tablet Take 1,300 mg by mouth 2 (two) times daily.   Yes Historical Provider, MD    Social History:    reports that she has never smoked. She does not have any smokeless tobacco history on file. She reports that she does not drink alcohol or use illicit drugs.   Family History:    Family History  Problem Relation Age of Onset  . Cancer Mother   . Heart failure Father   . Coronary artery disease Father   . Cancer      siblings  . Heart failure      siblings    Mental Status Examination/Evaluation: Objective:  Appearance: Casual and A. from Neck brace for support  Psychomotor Activity:  Normal  Eye Contact::  Good  Speech:  Clear and Coherent  Volume:  Normal  Mood:  Dysphoric  Affect:  Congruent  Thought Process:  Coherent, Relevant and  Unable to recall specifics of her fall and call for help  Orientation:  Full  Thought Content: Concern for her present loss of independence, concern about length of stay, concern for recovery and regaining her independent lifestyle.   Suicidal Thoughts:  No  Homicidal Thoughts:  No  Judgement:  Good  Insight:  Good    DIAGNOSIS:   AXIS I   Depression related to other medical condition,   AXIS II  Deffered  AXIS III See medical notes.  AXIS IV other psychosocial or environmental problems, problems related to social environment, problems with primary support group and Concern about recovery to previous level of functin. function   AXIS V 51-60 moderate symptoms   Assessment/Plan: Plan to discuss with Dr. Ardyth Harps,, discussed with Psych CSW Patient is very calm alert and oriented to situation. She is then postop recovery and has concerns about her ability to assume prior level of activity. Discussion is in process to determine her admission to inpatient rehabilitation which she would appreciate greatly. She has no expressed intention of suicide present or past. She has not had consistent treatment for mood or anxiety. Given her present situation, it is advisable to suggest a medication for mood stabilization that will not exacerbate any cardiac problem. This will be discussed with Dr. Ardyth Harps. RECOMMENDATION:  1. Suggests antidepressant medication, possibly ES-Citalopram 20 mg daily for depression. 2.  Support recommendation for inpatient rehabilitation. 3.  Will follow patient Herchel Hopkin J. Ferol Luz, MD Psychiatrist  12/04/2011 2:59 PM

## 2011-12-04 NOTE — Progress Notes (Signed)
Medicated patient for complaints of pain,  Pt states she doesn't remember Dr. Yetta Barre seeing her this am.  Seems very discouraged.  Refusing breakfast

## 2011-12-04 NOTE — Progress Notes (Signed)
Occupational Therapy Treatment Patient Details Name: [name] MRN: [id number] DOB: [birth year] Today's Date: 12/04/2011 Time: 3244-0102 OT Time Calculation (min): 32 min  OT Assessment / Plan / Recommendation Comments on Treatment Session Pt making progress since re-eval post ACDF. Will continue to benefit from acute OT with follow up at inpatient rehab    Follow Up Recommendations  Inpatient Rehab       Equipment Recommendations  Defer to next venue    Recommendations for Other Services Rehab consult  Frequency Min 3X/week   Plan Discharge plan remains appropriate    Precautions / Restrictions Precautions Precautions: Fall Precaution Comments: Pt with hypersensitivity to touch (pain) left arm from elbow distally (better than it wa prior to ACDF, but still an issue) Required Braces or Orthoses: Cervical Brace Cervical Brace: Soft collar Restrictions Weight Bearing Restrictions: No   Pertinent Vitals/Pain 7/10 Bil UEs LUE>RUE overall    ADL  Grooming: Performed;Brushing hair;Minimal assistance (with RUE) Where Assessed - Grooming: Unsupported sitting Toilet Transfer: Simulated;Minimal assistance Toilet Transfer Method: Sit to stand Toilet Transfer Equipment:  (Bed to recliner about 3 feet away from each other) Transfers/Ambulation Related to ADLs: Min A ADL Comments: Pt able to push soft touch call bell. Pt also able to turn on/off phone now with RUE, turn it over and put it up to her ear on the right.       OT Goals ADL Goals ADL Goal: Grooming - Progress: Progressing toward goals ADL Goal: Toilet Transfer - Progress: Progressing toward goals Arm Goals Arm Goal: Additional Goal #2 - Progress: Progressing toward goals Miscellaneous OT Goals OT Goal: Miscellaneous Goal #1 - Progress: Progressing toward goals  Visit Information  Last OT Received On: 12/04/11 Assistance Needed: +1    Subjective Data  Subjective: "I just think I would be better off gone."   (Encouraged her that in the scheme of things this is a short term issue and that things will get better, that she was a very active person before this happened and it can be that way again it will just take a while)      Cognition  Overall Cognitive Status: Appears within functional limits for tasks assessed/performed Arousal/Alertness: Awake/alert Orientation Level: Appears intact for tasks assessed Behavior During Session: [facility] for tasks performed    Mobility  Bed Mobility Bed Mobility: Supine to Sit;Sitting - Scoot to Edge of Bed Supine to Sit: 4: Min guard;HOB elevated (20 degrees) Sitting - Scoot to Edge of Bed: 5: Supervision Transfers Transfers: Sit to Stand;Stand to Sit Sit to Stand: 4: Min assist;From elevated surface;With upper extremity assist;From bed (using RUE only) Stand to Sit: 4: Min assist;With upper extremity assist;With armrests;To chair/3-in-1 (using RUE only)       Exercises  Other Exercises Other Exercises: Pt able close LUE into a fist about 2/3 of way and has full extension. Pt can oppose thumb to all digits on LUE with 1st to 5th requiring increased effort. Noted decreased coordination of RUE with FM and GM as well as proprioception.      End of Session OT - End of Session Equipment Utilized During Treatment: Cervical collar Activity Tolerance: Patient tolerated treatment well Patient left: in chair;with call bell/phone within reach Nurse Communication: Mobility status       Evette Georges [phone] 12/04/2011, 1:07 PM

## 2011-12-04 NOTE — Progress Notes (Signed)
Physical Therapy Treatment Patient Details Name: [name] MRN: [id number] DOB: 1928-10-30 Today's Date: 12/04/2011 Time: 1478-2956 PT Time Calculation (min): 64 min  PT Assessment / Plan / Recommendation Comments on Treatment Session  Pt mobility improved but continues to be limited. Pt c/o R knee pain which she reports was not present prior to falling.  Pt states that she has told everyone (staff) about her knee pain but nothing has been done about it.  Assessment of pt knee reveals R knee edema and tenderness to palpation of inferior lateral patella, patellar tendon, medial and lateral joint line. +patellar grind test, negative for instability.  Notified RN and made sticky note entry for MD.  Acute PT will continue to progress pt.  Provided pt with HEP hand out for ankle pumps and quad sets.        Follow Up Recommendations  Inpatient Rehab    Barriers to Discharge        Equipment Recommendations  Rolling walker with 5" wheels (with L platform)    Recommendations for Other Services OT consult  Frequency Min 3X/week   Plan Discharge plan remains appropriate;Frequency remains appropriate    Precautions / Restrictions Precautions Precautions: Fall Precaution Comments: Pt with hypersensitivity to touch (pain) left arm from elbow distally (better than it wa prior to ACDF, but still an issue) Required Braces or Orthoses: Cervical Brace Cervical Brace: Soft collar Restrictions Weight Bearing Restrictions: No   Pertinent Vitals/Pain C/o pain in L shoulder, L hand, and Right knee.  RN notified.      Mobility  Bed Mobility Bed Mobility: Not assessed Transfers Transfers: Sit to Stand;Stand to Sit Sit to Stand: 4: Min assist;With upper extremity assist;From chair/3-in-1 (4 trials) Sit to Stand: Patient Percentage: 80% Stand to Sit: 4: Min assist;With upper extremity assist;With armrests;To chair/3-in-1 Stand to Sit: Patient Percentage: 90% Stand Pivot Transfers: Not tested  (comment) Details for Transfer Assistance: Assist to initiate standing secondary to Right knee pain and inability of pt to use L UE secondary to hand pain.  Assist for controlled descent to chair.  Ambulation/Gait Ambulation/Gait Assistance: 4: Min assist Ambulation/Gait: Patient Percentage: 90% Ambulation Distance (Feet): 120 Feet Assistive device: Left platform walker Ambulation/Gait Assistance Details: Pt gait much more stable per daughter who was present when pt last ambulated on saturday.  Pt reports feeling more stability with platform walker but did c/o L shoulder pain.  Pt reports R knee pain worsening with increased distance.  Cue pt to widen base of support as pt externally rotates and adduct left hip in L LE swing phase.  Unable to provide visual feedback secondary to cervical precautions.   Gait Pattern: Step-through pattern;Decreased stride length;Scissoring;Festinating;Narrow base of support General Gait Details: see above Stairs: No Wheelchair Mobility Wheelchair Mobility: No    Exercises Total Joint Exercises Ankle Circles/Pumps: Both;10 reps;Seated Quad Sets: Both;10 reps;Seated   PT Diagnosis:    PT Problem List:   PT Treatment Interventions:     PT Goals Acute Rehab PT Goals PT Goal Formulation: With patient Time For Goal Achievement: 12/11/11 Potential to Achieve Goals: Good Pt will go Supine/Side to Sit: with modified independence Pt will go Sit to Supine/Side: with modified independence Pt will go Sit to Stand: with modified independence PT Goal: Sit to Stand - Progress: Progressing toward goal Pt will go Stand to Sit: with modified independence PT Goal: Stand to Sit - Progress: Progressing toward goal Pt will Ambulate: >150 feet;with modified independence;with rolling walker;with least restrictive assistive device PT  Goal: Ambulate - Progress: Progressing toward goal Pt will Go Up / Down Stairs: 1-2 stairs;with modified independence;with rail(s) PT Goal:  Up/Down Stairs - Progress: Not met  Visit Information  Last PT Received On: 12/04/11 Assistance Needed: +1    Subjective Data  Subjective: My knee hurts whenever I stand.   Patient Stated Goal: less pain   Cognition  Overall Cognitive Status: Appears within functional limits for tasks assessed/performed Arousal/Alertness: Awake/alert Orientation Level: Appears intact for tasks assessed Behavior During Session: [name] for tasks performed    Balance  Balance Balance Assessed: No  End of Session PT - End of Session Equipment Utilized During Treatment: Gait belt;Cervical collar Activity Tolerance: Patient tolerated treatment well;Patient limited by pain Patient left: in chair;with call bell/phone within reach Nurse Communication: Mobility status   GP     [name] 12/04/2011, 6:41 PM [name] DPT [phone]

## 2011-12-04 NOTE — Progress Notes (Signed)
[facility]             Progress Note   Subjective: Had Surgery last Thursday. Patient still complains of weakness and pain L>R UE.  No further fever; denies CP or SOB. Frustrated and with flat affect.  Objective: Vital signs in last 24 hours: Temp:  [99 F (37.2 C)-99.7 F (37.6 C)] 99.1 F (37.3 C) (09/23 1403) Pulse Rate:  [77-86] 83  (09/23 1403) Resp:  [18-20] 20  (09/23 1403) BP: (94-124)/(46-49) 113/47 mmHg (09/23 1403) SpO2:  [91 %-97 %] 97 % (09/23 1403) Weight change:  Last BM Date: 12/02/11  Intake/Output from previous day: 09/22 0701 - 09/23 0700 In: 303 [P.O.:300; I.V.:3] Out: 300 [Urine:300]     Physical Exam: General: Alert, awake, oriented x3. HEENT: No bruits, no goiter. Heart: Regular rate and rhythm, has a SEM. Lungs: Clear to auscultation bilaterally. Abdomen: Soft, nontender, nondistended, positive bowel sounds. Extremities: No clubbing cyanosis or edema with positive pedal pulses. No upper extremity deformities noted.    Lab Results: CBG:  Basename 12/04/11 1209 12/04/11 0655 12/03/11 2158 12/03/11 1607 12/03/11 1113 12/03/11 0640  GLUCAP 217* 117* 224* 279* 171* 126*   Urine Drug Screen: Drugs of Abuse     Component Value Date/Time   LABOPIA  Value: POSITIVE (NOTE) Result repeated and verified. Sent for confirmatory testing* 09/06/2009 0037   COCAINSCRNUR NEGATIVE 09/06/2009 0037   LABBENZ NEGATIVE 09/06/2009 0037   AMPHETMU NEGATIVE 09/06/2009 0037      Studies/Results: Dg Chest Port 1 View  12/02/2011  *RADIOLOGY REPORT*  Clinical Data: Fever  PORTABLE CHEST - 1 VIEW  Comparison: 11/17/2011  Findings: Prior CABG.  Negative for heart failure or pneumonia. Lungs are clear.  Negative for pleural effusion.  IMPRESSION: No active cardiopulmonary disease.   Original Report Authenticated By: Camelia Phenes, M.D.     Medications: Scheduled Meds:    . amLODipine  10 mg Oral QHS  . atorvastatin  20 mg Oral Daily  . docusate  sodium  100 mg Oral BID  . DULoxetine  30 mg Oral Daily  . feeding supplement  237 mL Oral BID BM  . feeding supplement  1 Container Oral BID WC  . gabapentin  300 mg Oral TID  . insulin aspart  0-5 Units Subcutaneous QHS  . insulin aspart  0-9 Units Subcutaneous TID WC  . insulin glargine  15 Units Subcutaneous Daily  . multivitamin with minerals  1 tablet Oral Daily  . polyethylene glycol  17 g Oral Daily  . sodium bicarbonate  1,300 mg Oral BID  . sodium chloride  250 mL Intravenous Once  . sodium chloride  3 mL Intravenous Q12H   Continuous Infusions:    . sodium chloride    . 0.9 % NaCl with KCl 20 mEq / L 75 mL/hr (12/02/11 0600)   PRN Meds:.acetaminophen, acetaminophen, LORazepam, menthol-cetylpyridinium, methocarbamol, morphine injection, nitroGLYCERIN, ondansetron (ZOFRAN) IV, ondansetron, oxyCODONE, phenol, sodium chloride, traMADol  Assessment/Plan:  Principal Problem:  *Upper extremity weakness Active Problems:  Hypertension  Dyslipidemia  DM (diabetes mellitus)  UTI (lower urinary tract infection)  Altered mental status  Fall  Pain of upper extremity  Fever   Fall -UTI presumed to have played a role in her fall. -Continue PT. -Patient is now refusing SNF, so we will need to arrange for [facility] care upon DC vs CIR. -Will check vit D level  UTI -Culture negative, but had already received antibiotics in the ED. -Has completed treatment with rocephin.  Fever -no further fever -CXR and UA without abnormalities. -Blood cultures ordered and so far no growth.  Bilateral UE Pain and Weakness -Had decompressive surgery last Thursday. -Complained of increase pain and weakness post surgery. -Ct scan done that showed: Good decompression of the spinal canal at C5-6 after anterior  cervical fusion. No evidence of epidural hematoma or other acute  neural compression. -Continue plan as per neurosurgery. -Awaiting  Insurance authorization for CIR. -Continue neurontin  robaxin and PRN narcotics -Will also start cymbalta  Hyperlipidemia -Continue statin  Encephalopathy -Resolved -Presumed secondary to UTI.  DM -CBGs well controlled. -Continue lantus to 15 . -Continue carb modify diet  Depression -Psych has been consulted -start cymbalta  Disposition -Await CIR insurance authorization; patient otherwise stable to be transfer. -Patient would certainly benefit from CIR prior to returning home as per PT/OT notes.     LOS: 17 days   Leniya Breit [facility] Pager: [phone] 12/04/2011, 2:46 PM

## 2011-12-04 NOTE — Progress Notes (Signed)
Patient ID: [name], female   DOB: 06-11-1928, 78 y.o.   MRN: [id number] Patient appears somewhat better today. She states "I hurt all over" most of the pain appears to be in the neck. Left arm. The somewhat better and that I can touch his now without her wincing in pain. Right arm appears to be better. she states she is doing okay with her right arm. Incision is okay. Has at least antigravity strength and can wiggle fingers with both hands.

## 2011-12-04 NOTE — Clinical Social Work Psych Assess (Signed)
Clinical Social Work Department CLINICAL SOCIAL WORK PSYCHIATRY SERVICE LINE ASSESSMENT 12/04/2011  Patient:  [name]  Account:  [id number]  Admit Date:  11/17/2011  Clinical Social Worker:  Ashley Jacobs, LCSW  Date/Time:  12/04/2011 01:38 PM Referred by:  Physician  Date referred:  12/04/2011 Reason for Referral  Behavioral Health Issues   Presenting Symptoms/Problems (In the person's/family's own words):   Per daughters report and other providers, patient's mood is depressed and patient continues to be flat with affect.  Patient also reports she is depressed and upset regarding being in the hospital so long, being a burden on her family and going to a nursing home.   Abuse/Neglect/Trauma History (check all that apply)  Denies history   Abuse/Neglect/Trauma Comments:   none reported   Psychiatric History (check all that apply)  Denies history   Psychiatric medications:  reports she has taken some medicine for her nerves but not on a regular basis and not currently taking any medication for mood.   Current Mental Health Hospitalizations/Previous Mental Health History:   none   Current provider:   none   Place and Date:   none   Current Medications:   Scheduled Meds:   . amLODipine  10 mg Oral QHS  . atorvastatin  20 mg Oral Daily  . docusate sodium  100 mg Oral BID  . feeding supplement  237 mL Oral BID BM  . feeding supplement  1 Container Oral BID WC  . gabapentin  300 mg Oral TID  . insulin aspart  0-5 Units Subcutaneous QHS  . insulin aspart  0-9 Units Subcutaneous TID WC  . insulin glargine  15 Units Subcutaneous Daily  . multivitamin with minerals  1 tablet Oral Daily  . polyethylene glycol  17 g Oral Daily  . sodium bicarbonate  1,300 mg Oral BID  . sodium chloride  250 mL Intravenous Once  . sodium chloride  3 mL Intravenous Q12H   Continuous Infusions:   . sodium chloride    . 0.9 % NaCl with KCl 20 mEq / L 75 mL/hr (12/02/11 0600)   PRN  Meds:.acetaminophen, acetaminophen, LORazepam, menthol-cetylpyridinium, methocarbamol, morphine injection, nitroGLYCERIN, ondansetron (ZOFRAN) IV, ondansetron, oxyCODONE, phenol, sodium chloride, traMADol   Previous Impatient Admission/Date/Reason:   none   Emotional Health / Current Symptoms    Suicide/Self Harm  None reported   Suicide attempt in the past:   Patient reports she does not want to go to a nursing home, but reports if she has too she would rather die. reports she does not want to be a burden on her family, but really does not want to go to a nursing home because that is very depression. No plan or thoughts of suicide, but reports she has lived a good life and would be okay letting go now if the time was right.   Other harmful behavior:   none   Psychotic/Dissociative Symptoms  Inability to care for self   Other Psychotic/Dissociative Symptoms:   Patient due to medical and physical problems needs continued care after her hospitalization  CIR has been in review and family agreeable to go home with her with [facility] per notes.    Attention/Behavioral Symptoms  Withdrawn   Other Attention / Behavioral Symptoms:   Patient sitting up in chair and watching TV in room.  She is cooperative during assessment, alert and oriented to place, self, date and time.  She is aware of why she is in the hospital and need for  CIR.  Patient reports everyone has been so kind to her, but she struggles depending on others and feels depressed she has been such a burden on her family and others.  Reports mood at home is WNL and she is very happy with her grandchildren and children.  She reports she is very independent at home, takes herself to the grocery store.  Reports this is very hard and difficult for her to sit all day.  Reports stress is high.    Cognitive Impairment  Within Normal Limits   Other Cognitive Impairment:   none reported.  Patient cannot remember how she fell but able to  complete sequence of events prior to fall.    Mood and Adjustment  DEPRESSION  Flat    Stress, Anxiety, Trauma, Any Recent Loss/Stressor  Other - See comment   Anxiety (frequency):   Phobia (specify):   Compulsive behavior (specify):   Obsessive behavior (specify):   Other:   Regarding continued stay and burden on family, patient feels depressed and upset about the situation and not being able to take care of herself, but rather depending on others   Substance Abuse/Use  None   SBIRT completed (please refer for detailed history):  N  Self-reported substance use:   none   Urinary Drug Screen Completed:  N Alcohol level:   non    Environmental/Housing/Living Arrangement  Stable housing   Who is in the home:   patient was living alone   Emergency contact:  Daugthers (3)  Synetta Fail has been main Scientist, product/process development  Medicare   Patient's Strengths and Goals (patient's own words):   Agreeable for CIR and medication managment   Clinical Social Worker's Interpretive Summary:   1. Patient displays evidence of depression as she is tearful, poor eye contact and self report of feeling depressed. She is aware of her daughters concerns, but able to verbalize feelings of limited independence and relying on family. She reports she does not want to be a burden on her family.  reports she will go to rehab, but does not want a SNF.  2. Agreeable for medication for her mood and nerves, Dr. Ferol Luz made aware and will adjust.  3. CIR is reviewing patient for possible admission, (insurance and bed situation) or patient to go home with [facility] per CSW Sarah's note. Not agreeable to SNF.  4. Will follow up as needed .   Disposition:  Recommend Psych CSW continuing to support while in hospital   Ashley Jacobs, MSW LCSW [phone]

## 2011-12-04 NOTE — Consult Note (Signed)
Physical Medicine and Rehabilitation Consult Reason for Consult: Recent cervical decompression C5-6 Referring Physician: [facility]   HPI: [name] is a 78 y.o. right-handed female with history of coronary artery disease, CABG, diabetes mellitus with peripheral neuropathy. Admitted 11/18/2011 after reported fall that was unwitnessed. Patient was a poor historian. Patient with upper extremity weakness noted after fall. Cranial CT scan was negative for acute intracranial abnormalities. Noted creatinine upon admission of 1.48. Carotid Dopplers completed workup of questionable syncopal episode showing no ICA stenosis. MRI cervical spine revealed acute injury to C5-6 with stenosis, signal change in the posterior ligamentous structure, and mild signal change in the cord. Followup neurosurgery plan for ACDF. Surgery delayed secondary to patient being on aspirin Plavix which was held. Underwent decompressive anterior cervical discectomy C5-6 11/30/2011 per Dr. Marikay Alar. Advised cervical collar for comfort. Postoperative pain control. Physical and occupational therapy ongoing with recommendations for physical medicine rehabilitation consult to consider inpatient rehabilitation services.  Review of Systems  Musculoskeletal: Positive for myalgias, back pain and falls.  Neurological: Positive for tingling.  Psychiatric/Behavioral: Positive for memory loss.   Past Medical History  Diagnosis Date  . Shortness of breath   . CAD (coronary artery disease)     Last catheterization 2009, grafts patent, EF 70%, 80% small OM with possible mild ischemic  . Hx of CABG     2000, LIMA to LAD, SVG to diagonal, SVG to OM, SVG to posterior descending  . Ejection fraction     EF 70%,, 2009 /  EF 65%, echo, October, 2011  . Hypertension   . Palpitations   . Dyslipidemia   . Diabetes mellitus   . Pancreatitis   . Carotid artery disease     Mild, Doppler, 2000  . Dizziness     Evaluated April, 2008, no treatment  needed  . Cervical spine disease     Severe neck pain, 2011  . Hyperkalemia     Her doctor Zachery Dauer, September, 2012, ACE inhibitor stopped, probable amlodipine to be  . Shortness of breath   . History of hysterectomy   . Chest pain     Chest hurting, May, 2013   Past Surgical History  Procedure Date  . Coronary artery bypass graft 2000  . Cardiac catheterization 2009  . Cholecystectomy    Family History  Problem Relation Age of Onset  . Cancer Mother   . Heart failure Father   . Coronary artery disease Father   . Cancer      siblings  . Heart failure      siblings   Social History:  reports that she has never smoked. She does not have any smokeless tobacco history on file. She reports that she does not drink alcohol or use illicit drugs. Allergies:  Allergies  Allergen Reactions  . Codeine     Unknown   Medications Prior to Admission  Medication Sig Dispense Refill  . aspirin 81 MG tablet Take 81 mg by mouth daily.        Marland Kitchen atorvastatin (LIPITOR) 20 MG tablet Take 1 tablet (20 mg total) by mouth daily.  90 tablet  2  . clopidogrel (PLAVIX) 75 MG tablet Take 1 tablet (75 mg total) by mouth daily.  90 tablet  2  . furosemide (LASIX) 20 MG tablet TAKE ONE-HALF TABLET BY MOUTH EVERY DAY  45 tablet  2  . glyBURIDE (DIABETA) 5 MG tablet Take 5 mg by mouth 2 (two) times daily with a meal.        .  metFORMIN (GLUCOPHAGE) 500 MG tablet Take 2 tabs in the AM and 1 tab in the PM      . multivitamin (THERAGRAN) per tablet Take 1 tablet by mouth daily.        . nitroGLYCERIN (NITROSTAT) 0.4 MG SL tablet Place 0.4 mg under the tongue every 5 (five) minutes as needed. For chest pain      . sitaGLIPtin (JANUVIA) 100 MG tablet Take 100 mg by mouth daily.        . sodium bicarbonate 650 MG tablet Take 1,300 mg by mouth 2 (two) times daily.        Home: Home Living Lives With: Alone Available Help at Discharge: Family;Neighbor Type of Home: House Home Access: Stairs to enter Water quality scientist of Steps: 2 Entrance Stairs-Rails: Left Home Layout: One level Bathroom Shower/Tub: Counselling psychologist: Yes How Accessible: Accessible via walker Home Adaptive Equipment: Shower chair with back  Functional History: Prior Function Able to Take Stairs?: Yes Driving: Yes Vocation: Retired Comments: Used to own the [employer] Functional Status:  Mobility: Bed Mobility Bed Mobility: Rolling Left;Rolling Right Rolling Right: 4: Min assist;With rail Rolling Left: 3: Mod assist Right Sidelying to Sit: 1: +2 Total assist;HOB flat Right Sidelying to Sit: Patient Percentage: 60% Supine to Sit: 4: Min assist Sitting - Scoot to [place] of Bed: 5: Supervision Sit to Supine: 4: Min guard;HOB elevated Transfers Transfers: Sit to Stand;Stand to Sit (2 trials.) Sit to Stand: 1: +2 Total assist;With upper extremity assist;From bed Sit to Stand: Patient Percentage: 60% Stand to Sit: 1: +2 Total assist;With upper extremity assist;To bed;To chair/3-in-1 Stand to Sit: Patient Percentage: 60% Stand Pivot Transfers: 4: Min assist Ambulation/Gait Ambulation/Gait Assistance: 1: +2 Total assist Ambulation/Gait: Patient Percentage: 70% Ambulation Distance (Feet): 5 Feet Assistive device: 2 person hand held assist Ambulation/Gait Assistance Details: Assist for balance due to gross instability throughout trunk and bilateral LEs with tremors/weakness noted (L > R).  Cues for tall posture and sequence. Gait Pattern: Step-to pattern;Decreased stride length;Lateral hip instability;Narrow base of support;Trunk flexed General Gait Details: some scissoring, which is worse today Stairs: No Wheelchair Mobility Wheelchair Mobility: No  ADL: ADL Eating/Feeding: Performed;Minimal assistance;Min guard;Other (comment) (using AE for feeding. built up tubing, sippy cup, plate guar) Where Assessed - Eating/Feeding: Bed level Grooming:  Performed;Maximal assistance Where Assessed - Grooming: Supine, head of bed up Upper Body Bathing: Simulated;Maximal assistance Where Assessed - Upper Body Bathing: Supine, head of bed up Lower Body Bathing: Simulated;+1 Total assistance Where Assessed - Lower Body Bathing: Supine, head of bed up;Lean right and/or left Upper Body Dressing: Simulated;Maximal assistance Where Assessed - Upper Body Dressing: Supine, head of bed up Lower Body Dressing: Simulated;+1 Total assistance Where Assessed - Lower Body Dressing: Supine, head of bed up;Rolling right and/or left Toilet Transfer: Simulated;Moderate assistance Toilet Transfer Method: Sit to stand;Stand pivot Toilet Transfer Equipment: Bedside commode Equipment Used:  (soft c collar) Transfers/Ambulation Related to ADLs: Mod A ADL Comments: Pt upset about not being able to self feed. Focus of session of adapting utensils and teaching compensatory techniques to increase independence wna success with slf feeding  Cognition: Cognition Arousal/Alertness: Awake/alert Orientation Level: Oriented to person;Oriented to place;Oriented to time;Oriented to situation Cognition Overall Cognitive Status: Appears within functional limits for tasks assessed/performed Arousal/Alertness: Awake/alert Orientation Level: Appears intact for tasks assessed Behavior During Session: [facility] for tasks performed  Blood pressure 105/49, pulse 77, temperature 99.1 F (37.3 C), temperature source Oral, resp. rate 20,  height 5\' 6"  (1.676 m), weight 64.3 kg (141 lb 12.1 oz), SpO2 91.00%. Physical Exam  Vitals reviewed. HENT:  Head: Normocephalic.  Eyes:       Pupils round and reactive to light  Neck:       Soft cervical collar in place  Cardiovascular: Normal rate and regular rhythm.   Pulmonary/Chest: Breath sounds normal. She has no wheezes.  Abdominal: Bowel sounds are normal. She exhibits no distension. There is no tenderness.  Musculoskeletal: She exhibits no  edema.  Neurological: She is alert.       Patient is oriented to name, year and place. He had some difficulty recalling full of dense related to her fall. She follows two-step commands  Psychiatric: She has a normal mood and affect.  Ataxia FNF in BUE Motor 3/5 in B Delt, 4- bi,tri, grip,  3- B HF,KE,ADF Sensory mildly reduced Lt touch in hands   Results for orders placed during the hospital encounter of 11/17/11 (from the past 24 hour(s))  GLUCOSE, CAPILLARY     Status: Abnormal   Collection Time   12/03/11  6:40 AM      Component Value Range   Glucose-Capillary 126 (*) 70 - 99 mg/dL   Comment 1 Documented in Chart     Comment 2 Notify RN    GLUCOSE, CAPILLARY     Status: Abnormal   Collection Time   12/03/11 11:13 AM      Component Value Range   Glucose-Capillary 171 (*) 70 - 99 mg/dL  GLUCOSE, CAPILLARY     Status: Abnormal   Collection Time   12/03/11  4:07 PM      Component Value Range   Glucose-Capillary 279 (*) 70 - 99 mg/dL  GLUCOSE, CAPILLARY     Status: Abnormal   Collection Time   12/03/11  9:58 PM      Component Value Range   Glucose-Capillary 224 (*) 70 - 99 mg/dL   Dg Chest Port 1 View  12/02/2011  *RADIOLOGY REPORT*  Clinical Data: Fever  PORTABLE CHEST - 1 VIEW  Comparison: 11/17/2011  Findings: Prior CABG.  Negative for heart failure or pneumonia. Lungs are clear.  Negative for pleural effusion.  IMPRESSION: No active cardiopulmonary disease.   Original Report Authenticated By: Camelia Phenes, M.D.     Assessment/Plan: Diagnosis: Cervical myelopathy s/p ACDF 1. Does the need for close, 24 hr/day medical supervision in concert with the patient's rehab needs make it unreasonable for this patient to be served in a less intensive setting? Yes 2. Co-Morbidities requiring supervision/potential complications: DM,CAD,HTN,Post op confusion 3. Due to bladder management, bowel management, safety, skin/wound care, disease management, medication administration, pain  management and patient education, does the patient require 24 hr/day rehab nursing? Yes 4. Does the patient require coordinated care of a physician, rehab nurse, PT (1-2 hrs/day, 5 days/week) and OT (1-2 hrs/day, 5 days/week) to address physical and functional deficits in the context of the above medical diagnosis(es)? Yes Addressing deficits in the following areas: balance, endurance, locomotion, strength, transferring, bowel/bladder control, bathing, dressing, feeding, grooming and toileting 5. Can the patient actively participate in an intensive therapy program of at least 3 hrs of therapy per day at least 5 days per week? Yes 6. The potential for patient to make measurable gains while on inpatient rehab is good 7. Anticipated functional outcomes upon discharge from inpatient rehab are Sup mobility with PT, Sup ADL with OT, assess cognition with SLP. 8. Estimated rehab length of stay to  reach the above functional goals is: 10-12 d 9. Does the patient have adequate social supports to accommodate these discharge functional goals? Potentially 10. Anticipated D/C setting: Home 11. Anticipated post D/C treatments: HH therapy 12. Overall Rehab/Functional Prognosis: good  RECOMMENDATIONS: This patient's condition is appropriate for continued rehabilitative care in the following setting: CIR Patient has agreed to participate in recommended program. Potentially Note that insurance prior authorization may be required for reimbursement for recommended care.  Comment:    12/04/2011

## 2011-12-05 ENCOUNTER — Inpatient Hospital Stay (HOSPITAL_COMMUNITY): Payer: Medicare Other

## 2011-12-05 ENCOUNTER — Encounter (HOSPITAL_COMMUNITY): Payer: Self-pay | Admitting: Neurological Surgery

## 2011-12-05 LAB — GLUCOSE, CAPILLARY
Glucose-Capillary: 120 mg/dL — ABNORMAL HIGH (ref 70–99)
Glucose-Capillary: 221 mg/dL — ABNORMAL HIGH (ref 70–99)

## 2011-12-05 MED ORDER — POLYETHYLENE GLYCOL 3350 17 G PO PACK: 17.0000 g | PACK | Freq: Every day | ORAL | Status: DC

## 2011-12-05 MED ORDER — OXYCODONE HCL 5 MG PO TABS: 5.0000 mg | ORAL_TABLET | ORAL | Status: DC | PRN

## 2011-12-05 MED ORDER — DULOXETINE HCL 30 MG PO CPEP: 30.0000 mg | ORAL_CAPSULE | Freq: Every day | ORAL | Status: DC

## 2011-12-05 MED ORDER — CLOPIDOGREL BISULFATE 75 MG PO TABS: 75.0000 mg | ORAL_TABLET | Freq: Every day | ORAL | Status: DC

## 2011-12-05 MED ORDER — ENSURE PUDDING PO PUDG: 1.0000 | Freq: Two times a day (BID) | ORAL | Status: DC

## 2011-12-05 MED ORDER — METHOCARBAMOL 500 MG PO TABS: 500.0000 mg | ORAL_TABLET | Freq: Four times a day (QID) | ORAL | Status: DC | PRN

## 2011-12-05 MED ORDER — ENSURE COMPLETE PO LIQD: 237.0000 mL | Freq: Two times a day (BID) | ORAL | Status: DC

## 2011-12-05 MED ORDER — TRAMADOL HCL 50 MG PO TABS: 50.0000 mg | ORAL_TABLET | Freq: Four times a day (QID) | ORAL | Status: DC | PRN

## 2011-12-05 MED ORDER — AMLODIPINE BESYLATE 10 MG PO TABS: 10.0000 mg | ORAL_TABLET | Freq: Every day | ORAL | Status: DC

## 2011-12-05 MED ORDER — GABAPENTIN 300 MG PO CAPS: 300.0000 mg | ORAL_CAPSULE | Freq: Three times a day (TID) | ORAL | Status: DC

## 2011-12-05 NOTE — Consult Note (Signed)
Patient Identification:  [name] Date of Evaluation:  12/05/2011 Reason for Consult:Pt has depression s/p fall; surgical decompression C4-6'   Referring Provider: Dr. Ardyth Harps  History of Present Illness:Pt has vague memory of fall; called for help Eval revealed need for urgent surgery to relieve spinal cord compression.   Past Psychiatric History: Occasional medication for depression but no regular regimine   Past Medical History:     Past Medical History  Diagnosis Date  . Shortness of breath   . CAD (coronary artery disease)     Last catheterization 2009, grafts patent, EF 70%, 80% small OM with possible mild ischemic  . Hx of CABG     2000, LIMA to LAD, SVG to diagonal, SVG to OM, SVG to posterior descending  . Ejection fraction     EF 70%,, 2009 /  EF 65%, echo, October, 2011  . Hypertension   . Palpitations   . Dyslipidemia   . Diabetes mellitus   . Pancreatitis   . Carotid artery disease     Mild, Doppler, 2000  . Dizziness     Evaluated April, 2008, no treatment needed  . Cervical spine disease     Severe neck pain, 2011  . Hyperkalemia     Her doctor Zachery Dauer, September, 2012, ACE inhibitor stopped, probable amlodipine to be  . Shortness of breath   . History of hysterectomy   . Chest pain     Chest hurting, May, 2013       Past Surgical History  Procedure Date  . Coronary artery bypass graft 2000  . Cardiac catheterization 2009  . Cholecystectomy   . Anterior cervical decomp/discectomy fusion 11/30/2011    Procedure: ANTERIOR CERVICAL DECOMPRESSION/DISCECTOMY FUSION 1 LEVEL;  Surgeon: Tia Alert, MD;  Location: [facility];  Service: Neurosurgery;  Laterality: N/A;  Anterior Cervical Decompression Discectomy Cervical Five-Six    Allergies:  Allergies  Allergen Reactions  . Codeine     Unknown    Current Medications:  Prior to Admission medications   Medication Sig Start Date End Date Taking? Authorizing Provider  aspirin 81 MG tablet Take  81 mg by mouth daily.     Yes Historical Provider, MD  atorvastatin (LIPITOR) 20 MG tablet Take 1 tablet (20 mg total) by mouth daily. 06/06/11  Yes Luis Abed, MD  furosemide (LASIX) 20 MG tablet TAKE ONE-HALF TABLET BY MOUTH EVERY DAY 10/30/11  Yes Luis Abed, MD  glyBURIDE (DIABETA) 5 MG tablet Take 5 mg by mouth 2 (two) times daily with a meal.     Yes Historical Provider, MD  metFORMIN (GLUCOPHAGE) 500 MG tablet Take 2 tabs in the AM and 1 tab in the PM   Yes Historical Provider, MD  multivitamin [facility]) per tablet Take 1 tablet by mouth daily.     Yes Historical Provider, MD  nitroGLYCERIN (NITROSTAT) 0.4 MG SL tablet Place 0.4 mg under the tongue every 5 (five) minutes as needed. For chest pain   Yes Historical Provider, MD  sitaGLIPtin (JANUVIA) 100 MG tablet Take 100 mg by mouth daily.     Yes Historical Provider, MD  sodium bicarbonate 650 MG tablet Take 1,300 mg by mouth 2 (two) times daily.   Yes Historical Provider, MD  amLODipine (NORVASC) 10 MG tablet Take 1 tablet (10 mg total) by mouth at bedtime. 12/05/11   Vassie Loll, MD  clopidogrel (PLAVIX) 75 MG tablet Take 1 tablet (75 mg total) by mouth daily. Continue on hold for 2  more weeks. 12/05/11   Vassie Loll, MD  DULoxetine (CYMBALTA) 30 MG capsule Take 1 capsule (30 mg total) by mouth daily. 12/05/11   Vassie Loll, MD  feeding supplement (ENSURE COMPLETE) LIQD Take 237 mLs by mouth 2 (two) times daily between meals. 12/05/11   Vassie Loll, MD  feeding supplement (ENSURE) PUDG Take 1 Container by mouth 2 (two) times daily with a meal. 12/05/11   Vassie Loll, MD  gabapentin (NEURONTIN) 300 MG capsule Take 1 capsule (300 mg total) by mouth 3 (three) times daily. 12/05/11   Vassie Loll, MD  methocarbamol (ROBAXIN) 500 MG tablet Take 1 tablet (500 mg total) by mouth every 6 (six) hours as needed. 12/05/11   Vassie Loll, MD  oxyCODONE (OXY IR/ROXICODONE) 5 MG immediate release tablet Take 1 tablet (5 mg total) by mouth  every 4 (four) hours as needed. 12/05/11   Vassie Loll, MD  polyethylene glycol [facility] / GLYCOLAX) packet Take 17 g by mouth daily. 12/05/11   Vassie Loll, MD  traMADol (ULTRAM) 50 MG tablet Take 1 tablet (50 mg total) by mouth every 6 (six) hours as needed. 12/05/11   Vassie Loll, MD    Social History:    reports that she has never smoked. She does not have any smokeless tobacco history on file. She reports that she does not drink alcohol or use illicit drugs.   Family History:    Family History  Problem Relation Age of Onset  . Cancer Mother   . Heart failure Father   . Coronary artery disease Father   . Cancer      siblings  . Heart failure      siblings    Mental Status Examination/Evaluation: Objective:  Appearance: Casual and Sitting up and wearing a cervical collar for stability  Psychomotor Activity:  Normal  Eye Contact::  Good  Speech:  Clear and Coherent and Soft  Volume:  Soft but clearly enunciated  Mood:  Anxious and Depressed  Affect:  Blunt and Depressed  Thought Process:  Coherent, Relevant and Organized and sequential  Orientation:  Full  Thought Content:  Maj. concerns regarding possibility of being placed in nursing home which is far from her wishes   Suicidal Thoughts:  No  Homicidal Thoughts:  No  Judgement:  Good  Insight:  Fair    DIAGNOSIS:   AXIS I Depression due to another medical condition  AXIS II  Deffered  AXIS III See medical notes.  AXIS IV economic problems, other psychosocial or environmental problems, problems related to social environment and very independent, avoiding thoughts of meeting nursing home care  AXIS V 51-60 moderate symptoms   Assessment/Plan:  Discussed with Psych CSW This patient is sitting up in her bed with a neck collar attached. She has good eye contact soft speech and is oriented to person, place, situation and future options. She denies suicidal ideation. She is more concerned about discharge plans. She  expresses a desire to avoid nursing home placement. She does recognize that she needs some rehabilitation.  She reports that she has been a single parent to all of her children after her divorce. She had 5 children, 2 of which have died one was hit by a train and the other died of patents disease at age 52. The remainder are in contact. She hated school, moving from a country school to a downtown school. 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 Her father was a  minor and was hurt, disabled since 14. Finally, she gave the bar long deal dried and to another owner. She has lost weight has taken some meds and has 7 grandchildren. She denies any thought of suicide she's had to former surgeries without complication she hadn't been followed by that she's had a CABG x4. She denies suicidal ideation, however, she is opposed to the thought of being in a nursing home and being a burden to her children.  RECOMMENDATION:  1.  Patient has capacity to participate in treatment decision. 2.  Will followup on possibility of transfer to inpatient rehabilitation. 3.  Medications noted ,, agree with Cymbalta 30 mg in a.m. daily, Neurontin 30 mg 3 times daily, 4.  Suggest trazodone 50 mg by mouth at bedtime, may repeat x1 when necessary insomnia 5.  Caution: Suggest substitution of tramadol 50 mg when necessary pain this may contribute to serotonin syndrome if used with duloxetine, Cymbalta 6.  I agree with inquiry to transfer to inpatient rehabilitation rather than nursing home. Will follow patient. Franky Reier J. Ferol Luz, MD Psychiatrist                       12/05/2011 11:37 PM

## 2011-12-05 NOTE — Progress Notes (Signed)
Physical Therapy Treatment Patient Details Name: [name] MRN: [id number] DOB: Dec 07, 1928 Today's Date: 12/05/2011 Time: 4098-1191 PT Time Calculation (min): 23 min  PT Assessment / Plan / Recommendation Comments on Treatment Session  Pt continues to be limited by pain and fatique.  Able to increase ambulation distance today.  Appears motivated to increase mobility.  Still presents with c/o R knee pain.    Follow Up Recommendations  Inpatient Rehab    Barriers to Discharge        Equipment Recommendations  Rolling walker with 5" wheels    Recommendations for Other Services OT consult  Frequency Min 3X/week   Plan Discharge plan remains appropriate;Frequency remains appropriate    Precautions / Restrictions Precautions Precautions: Fall Precaution Comments: Pt with hypersensitivity to touch (pain) left arm from elbow distally (better than it wa prior to ACDF, but still an issue) Required Braces or Orthoses: Cervical Brace Cervical Brace: Soft collar Restrictions Weight Bearing Restrictions: No   Pertinent Vitals/Pain C/o bilateral hand and L sided neck pain.  Hot pack provided, repositioned and RN notified.     Mobility  Bed Mobility Bed Mobility: Sit to Supine Supine to Sit: 4: Min guard;HOB flat Details for Bed Mobility Assistance: cues for technique Transfers Transfers: Sit to Stand;Stand to Sit Sit to Stand: 4: Min assist;With upper extremity assist;From chair/3-in-1 Stand to Sit: 4: Min guard;With upper extremity assist;To bed Details for Transfer Assistance: Cues for hand placement and LE placement for sit-stand.  Poor control of descent to bed from standing. Ambulation/Gait Ambulation/Gait Assistance: 4: Min assist Ambulation Distance (Feet): 200 Feet Assistive device: Left platform walker Ambulation/Gait Assistance Details: Pt still with R knee pain with increased ambulation distance.  Needed min assist at times to guide platform walker, especially with  turns.  Fatigued after ambulation. Gait Pattern: Step-through pattern;Decreased stride length;Narrow base of support Stairs: No Wheelchair Mobility Wheelchair Mobility: No        PT Goals Acute Rehab PT Goals PT Goal: Sit to Supine/Side - Progress: Progressing toward goal PT Goal: Sit to Stand - Progress: Progressing toward goal PT Goal: Stand to Sit - Progress: Progressing toward goal PT Goal: Ambulate - Progress: Progressing toward goal  Visit Information  Last PT Received On: 12/05/11 Assistance Needed: +1    Subjective Data  Subjective: "My neck is hurting this morning."   Cognition  Overall Cognitive Status: Appears within functional limits for tasks assessed/performed Arousal/Alertness: Awake/alert Orientation Level: Appears intact for tasks assessed Behavior During Session: [facility] for tasks performed    Balance  Balance Balance Assessed: No  End of Session PT - End of Session Equipment Utilized During Treatment: Gait belt;Cervical collar Activity Tolerance: Patient limited by fatigue;Patient limited by pain Patient left: in bed;with call bell/phone within reach Nurse Communication: Patient requests pain meds       Newell Coral 12/05/2011, 9:50 AM  Newell Coral, PTA Acute Rehab [phone] (office)  [phone] pager

## 2011-12-05 NOTE — Care Management Note (Addendum)
    Page 1 of 2   12/07/2011     11:33:58 AM   CARE MANAGEMENT NOTE 12/07/2011  Patient:  [name], [name]   Account Number:  [id number]  Date Initiated:  11/20/2011  Documentation initiated by:  Darlyne Russian  Subjective/Objective Assessment:   Patient admitted with UTI     Action/Plan:   Progression of care and discharge planning  PT/OT recommending inpatient rehab   Anticipated DC Date:  12/06/2011   Anticipated DC Plan:  IP REHAB FACILITY      DC Planning Services  CM consult      Choice offered to / List presented to:             Status of service:  Completed, signed off Medicare Important Message given?   (If response is "NO", the following Medicare IM given date fields will be blank) Date Medicare IM given:   Date Additional Medicare IM given:    Discharge Disposition:  IP REHAB FACILITY  Per UR Regulation:  Reviewed for med. necessity/level of care/duration of stay  If discussed at Long Length of Stay Meetings, dates discussed:    Comments:  12/07/11 [facility] inpatient rehab had bed become available, discharge to [facility] on 12/06/11. Jacquelynn Cree RN, BSN, CCM   12/05/11 Met with patient and her daughter Patton Salles regarding alternate d/c plans due to CIR unable to take the patient due to bed not available.They are open to inpatient rehab at [facility] beds available at [facility].If unable to go to rehab they want HHC with [facility].Contacted Zelda with [employer],verified that  they would approve rehab at [facility] and acute hospital stay until 12/06/11 if unable to get patient into [place] today.Faxed Hand P, demographics, PT and OT notes,and d/c summary to [phone] . Left vm with  Stacie Acres [phone], adm coordinator at [facility] requesting callback. Will continue to follow to facilitate d/c. Jacquelynn Cree RN, BSN,CCM  11/24/2011  1000 Darlyne Russian RN, [place] [phone] Met with patient and daughters regarding discharge planning and home  health services. Provided list of agencies in [place]. The patient will return to her home and the family will provide 24/7 supervision.   11/22/2011  0830 Darlyne Russian RN, CCM  [phone] Patient lives alone and has a neighbor who comes by to check on her, never had any need for home health or equipment, she is very independent until this episode.  Per PT evaluation, recommend SNF.

## 2011-12-05 NOTE — Progress Notes (Signed)
Inpatient Diabetes Program Recommendations  AACE/ADA: New Consensus Statement on Inpatient Glycemic Control (2013)  Target Ranges:  Prepandial:   less than 140 mg/dL      Peak postprandial:   less than 180 mg/dL (1-2 hours)      Critically ill patients:  140 - 180 mg/dL   Reason for Visit: Hyperglycemia  Note: Results for GEARLENE, GODSIL (MRN [id number]) as of 12/05/2011 13:18  Ref. Range 12/04/2011 06:55 12/04/2011 12:09 12/04/2011 16:12 12/04/2011 22:21 12/05/2011 06:30 12/05/2011 12:04  Glucose-Capillary Latest Range: 70-99 mg/dL 409 (H) 811 (H) 914 (H) 121 (H) 120 (H) 279 (H)   CBG's before breakfast are excellent.  However, before lunch and supper she required a total of 10 units Novolog coverage to bring CBG's back down.  Intake was variable earlier.  Currently patient is on a regular, not a CHO modified diet.  If appropriate, please change diet to CHO Modified Medium.  Also, consider adding Novolog 3 units meal coverage in addition to correction scale to be given if patient eats 50% or more of tray.  Thank you. Tanisia Yokley S. Elsie Lincoln, RN, CNS, CDE  [phone])

## 2011-12-05 NOTE — Progress Notes (Signed)
Pt w/ insurance auth for CIR, but, no bed available today.  Pt, her daughter & pt's CM made aware.  Pt & daughter also aware that other d/c options will be discussed w/ them.  Pt continues on CIR waiting list.  [phone]

## 2011-12-05 NOTE — Clinical Social Work Psych Note (Signed)
Patient discussed in LOS meeting with plan to dc to CIR vs Home with home health with family. Reviewed psych MD notes, no other recommendations for psych CSW and per patient no other needs at this time.  Will defer case to Unit CSW for all other needs, if psych needs arise: please call. Otherwise will sign off.  Ashley Jacobs, MSW LCSW [phone]

## 2011-12-05 NOTE — Discharge Planning (Addendum)
Physician Discharge Summary  [name] ZOX:[id number] DOB: [birth year] DOA: 11/17/2011  PCP: Gaye Alken, MD  Admit date: 11/17/2011 Discharge date: 12/06/2011  Recommendations for Outpatient Follow-up:  1. Followup with PCP in 2 weeks after being discharged from inpatient rehabilitation (will be important to follow vitamin D levels that has been order but pending at the moment of discharge; follow patient chronic kidney disease, diabetes and also blood pressure; medication adjustment to be done as needed. Follow patient symptoms recently she and after discharge from inpatient rehabilitation) 2. Will be important in 2 weeks to restart patient Plavix. 3. Adjust Cymbalta after one week of treatment to 30 mg twice a day. 4.  Check CBC in 1 week.  Anemia work up as outpatient.  Discharge Diagnoses:  Principal Problem:  *Upper extremity weakness Active Problems:  Hypertension  Dyslipidemia  DM (diabetes mellitus)  UTI (lower urinary tract infection)  Altered mental status  Fall  Pain of upper extremity  Fever  Depression   Discharge Condition: Stable and improved; still with upper extremities pain, weakness and numbness. Patient will be discharged to [facility] inpatient rehabilitation facility for further physical therapy. Patient has been advised to take medications as prescribed, to follow a low-sodium and low carbohydrate diet and also advised to keep herself hydrated. Further medication adjustment for chronic medical problems to be done by primary care physician at discharge from facility.  Diet recommendation: Heart healthy diet and low carbohydrates.  Filed Weights   11/26/11 2055 11/28/11 0533 11/29/11 2226  Weight: 63.957 kg (141 lb) 65.7 kg (144 lb 13.5 oz) 64.3 kg (141 lb 12.1 oz)    History of present illness:  78 y.o. female, with above-mentioned past medical history, patient lives at home by herself, she called EMS he due to fall, patient is very poor historian  and cannot give a reliable history, stressors circumstances of the fall are unclear, as well it was unwitnessed, patient can't tell if it there was any loss of consciousness, or head trauma, currently patient is confused home, and complaining of pain all over, had CT brain and cervical CT neck which did not show any evidence of acute finding, patient appears to be clinically dehydrated, as well having elevated creatinine from her baseline, as well her urinalysis came back significant for bacteria and white blood cells in the urine.   Hospital Course:  Fall  -UTI presumed to have played a role in her fall.  -Continue PT and physical conditioning at inpatient rehabilitation facility.Marland Kitchen    UTI  -Culture negative, but had already received antibiotics in the ED.  -Has completed treatment with rocephin.   Fever  -no further fever  -CXR and UA without abnormalities.  -Blood cultures without any growth.   Bilateral UE Pain and Weakness  -Had decompressive surgery last Thursday.  -Complained of increase pain and weakness post surgery.  -Ct scan done that showed: Good decompression of the spinal canal at C5-6 after anterior  cervical fusion. No evidence of epidural hematoma or other acute  neural compression.  -Continue neurontin, robaxin and PRN narcotics  -Will also continue cymbalta and follow physical therapy at the inpatient rehabilitation facility.  Right knee pain:  Stable from yesterday and without evidence of inflammation.  X-ray consistent with osteoarthritis.  Continue cymbalta  Hyperlipidemia  -Continue statin   Encephalopathy  -Resolved  -Presumed secondary to UTI.   DM  -CBGs well controlled.  -Continue home regimen and Continue carb modify diet  -Hemoglobin A1c 7.5  Depression  -started  on cymbalta; currently receiving 30 mg by mouth daily; after one week of being receiving this does please adjust the medication to 30 mg twice a day if needed. This medication will also  help with her neuropathy along with depression.  *The rest of her medical problems remains stable and no changes has been done to her home medication regimen. Plan is to continue same medication regimen and for her to follow with PCP for further adjustment as an outpatient.  Anemia, normocytic.  Likely due to acute illness.  Patient asymptomatic and therefore no need for acute transfusion.  -  Plan to recheck CBC in 1 week.  Consultations: Neurosurgery  Discharge Exam: Filed Vitals:   12/05/11 1800 12/05/11 2112 12/06/11 0132 12/06/11 0509  BP: 120/58 114/52 109/56 100/49  Pulse: 82 82 73 72  Temp: 97.5 F (36.4 C) 98.5 F (36.9 C) 98.1 F (36.7 C) 98.4 F (36.9 C)  TempSrc: Oral Oral Oral Oral  Resp: 18 20 20 20   Height:      Weight:      SpO2: 100% 94% 92% 92%   Patient states that she feels about the same as yesterday.  General: Flat affect, no acute distress Cardiovascular: Regular rate, no rubs, no gallops; S1 and S2 appreciated.  2/6 SEM at RUSB and heard across precordium Respiratory: Clear to auscultation bilaterally. Abdomen: Soft, nontender/nondistended; positive bowel sounds. Musculoskeletal: No cyanosis or edema appreciated on her extremities; positive right knee pain along medial joint line with erythema or effusion.   Neuro: Cranial left grossly intact, patient alert, awake and oriented x3; muscle strength 4/5 upper extremities bilaterally after cervical surgery; 4+/5 lower extremities. No other focal deficit appreciated.  Discharge Instructions      Discharge Orders    Future Appointments: Provider: Department: Dept Phone: Center:   12/14/2011 12:00 PM Luis Abed, MD [facility] [phone] [facility]     Future Orders Please Complete By Expires   Diet - low sodium heart healthy      Increase activity slowly      Discharge instructions      Comments:   -Keep patient well hydrated -In 2 weeks resume plavix -Take medications as  prescribed -Follow up with PCP in 2 weeks after been discharge from facility -physical rehab as per facility       Medication List     As of 12/06/2011  9:53 AM    TAKE these medications         amLODipine 10 MG tablet   Commonly known as: NORVASC   Take 1 tablet (10 mg total) by mouth at bedtime.      aspirin 81 MG tablet   Take 81 mg by mouth daily.      atorvastatin 20 MG tablet   Commonly known as: LIPITOR   Take 1 tablet (20 mg total) by mouth daily.      clopidogrel 75 MG tablet   Commonly known as: PLAVIX   Take 1 tablet (75 mg total) by mouth daily. Continue on hold for 2 more weeks.      DULoxetine 30 MG capsule   Commonly known as: CYMBALTA   Take 1 capsule (30 mg total) by mouth daily.      feeding supplement Pudg   Take 1 Container by mouth 2 (two) times daily with a meal.      feeding supplement Liqd   Take 237 mLs by mouth 2 (two) times daily between meals.      furosemide  20 MG tablet   Commonly known as: LASIX   TAKE ONE-HALF TABLET BY MOUTH EVERY DAY      gabapentin 300 MG capsule   Commonly known as: NEURONTIN   Take 1 capsule (300 mg total) by mouth 3 (three) times daily.      glyBURIDE 5 MG tablet   Commonly known as: DIABETA   Take 5 mg by mouth 2 (two) times daily with a meal.      metFORMIN 500 MG tablet   Commonly known as: GLUCOPHAGE   Take 2 tabs in the AM and 1 tab in the PM      methocarbamol 500 MG tablet   Commonly known as: ROBAXIN   Take 1 tablet (500 mg total) by mouth every 6 (six) hours as needed.      multivitamin per tablet   Take 1 tablet by mouth daily.      nitroGLYCERIN 0.4 MG SL tablet   Commonly known as: NITROSTAT   Place 0.4 mg under the tongue every 5 (five) minutes as needed. For chest pain      oxyCODONE 5 MG immediate release tablet   Commonly known as: Oxy IR/ROXICODONE   Take 1 tablet (5 mg total) by mouth every 4 (four) hours as needed.      polyethylene glycol packet   Commonly known as: MIRALAX /  GLYCOLAX   Take 17 g by mouth daily.      sitaGLIPtin 100 MG tablet   Commonly known as: JANUVIA   Take 100 mg by mouth daily.      sodium bicarbonate 650 MG tablet   Take 1,300 mg by mouth 2 (two) times daily.      traMADol 50 MG tablet   Commonly known as: ULTRAM   Take 1 tablet (50 mg total) by mouth every 6 (six) hours as needed.        Follow-up Information    Follow up with Gaye Alken, MD. Schedule an appointment as soon as possible for a visit in 1 week.   Contact information:   [address] [place] [address] [phone]           The results of significant diagnostics from this hospitalization (including imaging, microbiology, ancillary and laboratory) are listed below for reference.    Significant Diagnostic Studies: Dg Cervical Spine 1 View  11/30/2011  *RADIOLOGY REPORT*  Clinical Data: fusion  DG C-ARM 1-60 MIN,DG CERVICAL SPINE - 1 VIEW  Comparison:   the previous day's study  Findings: Single lateral intraoperative cervical radiograph demonstrate changes of instrumented ACDF C5-6.  The cervicothoracic junction is not visualized.  IMPRESSION:  1.  ACDF C5-6   Original Report Authenticated By: Osa Craver, M.D.    Dg Cervical Spine 1 View  11/29/2011  *RADIOLOGY REPORT*  Clinical Data: Preop for cervical spine surgery  DG CERVICAL SPINE - 1 VIEW  Comparison: MR cervical spine of 11/24/2011  Findings: On the single lateral view obtained the cervical vertebrae are in normal alignment.  There is diffuse degenerative spurring present.  Degenerative disc disease is noted at C4-5 and C5-6 levels with loss of disc space and spurring.  No cervical spine fracture is seen.  No prevertebral soft tissue swelling is noted.  IMPRESSION: Normal alignment.  Diffuse degenerative change with degenerative disc disease particularly at C4-5 and C5-6.   Original Report Authenticated By: Juline Patch, M.D.    Dg Forearm Left  12/01/2011  *RADIOLOGY  REPORT*  Clinical  Data: Pain post trauma  LEFT FOREARM - 2 VIEW  Comparison: None.  Findings: Frontal and lateral views were obtained.  There is no fracture or dislocation.  Joint spaces appear intact.  There is no abnormal periosteal reaction.  IMPRESSION: No abnormality noted.   Original Report Authenticated By: Arvin Collard. WOODRUFF III, M.D.    Ct Head Wo Contrast  11/17/2011  *RADIOLOGY REPORT*  Clinical Data:  Fall.  Head injury.  Frontal scalp hematoma and headache.  Epistaxis.  Neck pain.  CT HEAD WITHOUT CONTRAST CT CERVICAL SPINE WITHOUT CONTRAST  Technique:  Multidetector CT imaging of the head and cervical spine was performed following the standard protocol without intravenous contrast.  Multiplanar CT image reconstructions of the cervical spine were also generated.  Comparison:  Head CT on 09/05/2009  CT HEAD  Findings: There is no evidence of intracranial hemorrhage, brain edema or other signs of acute infarction.  There is no evidence of intracranial mass lesion or mass effect.  No abnormal extra-axial fluid collections are identified.  Mild cerebral atrophy and chronic small vessel disease are unchanged in appearance.  Ventricles remain stable in size.  No evidence of skull fracture.  IMPRESSION:  1.  No acute intracranial abnormality. 2.  Stable mild cerebral atrophy and chronic small vessel disease.  CT CERVICAL SPINE  Findings: No evidence of cervical spine fracture or subluxation.  Mild to moderate degenerative disc disease is seen from levels of C4 to C7.  Moderate facet DJD is also seen bilaterally, left side greater than right.  IMPRESSION:  1. Negative for acute cervical spine fracture or subluxation. 2.  Moderate cervical spondylosis, as described above.   Original Report Authenticated By: Danae Orleans, M.D.    Ct Cervical Spine Wo Contrast  12/01/2011  *RADIOLOGY REPORT*  Clinical Data: Left-sided neck shoulder arm pain and weakness. Anterior cervical fusion at C5-6 on 11/30/2011  CT  CERVICAL SPINE WITHOUT CONTRAST  Technique:  Multidetector CT imaging of the cervical spine was performed. Multiplanar CT image reconstructions were also generated.  Comparison: CT scan of the cervical spine dated 11/17/2011 and cervical MRI dated 11/24/2011  Findings: The patient has undergone anterior cervical fusion at C5- 6.  Screws, anterior plate, and interbody fusion device appear in excellent position.  There is no evidence of an epidural hematoma. Spinal canal is decompressed.  There is some streak artifact in the spinal canal at the operative level caused by the hardware.  The patient does have a fracture through the left vertebral foramen at C5 as well as a vertical fracture through the left side of the spinous process of C5.  This fracture does not extend through the lamina.  There is postsurgical air in the prevertebral soft tissues to the expected degree.  The remainder of the cervical spine is stable since the prior CT scan.  There is auto fusion of the left lateral masses at C4-5.  IMPRESSION:  1.  Good decompression of the spinal canal at C5-6 after anterior cervical fusion.  No evidence of epidural hematoma or other acute neural compression. 2.  The patient does have hairline fractures through the left vertebral foramen at C5 and through the left side of the spinous processes C5.  These are essentially unchanged with benefit of retrospection since the prior study although better seen on today's exam.   Original Report Authenticated By: Gwynn Burly, M.D.    Ct Cervical Spine Wo Contrast  11/17/2011  *RADIOLOGY REPORT*  Clinical Data:  Fall.  Head injury.  Frontal scalp hematoma and headache.  Epistaxis.  Neck pain.  CT HEAD WITHOUT CONTRAST CT CERVICAL SPINE WITHOUT CONTRAST  Technique:  Multidetector CT imaging of the head and cervical spine was performed following the standard protocol without intravenous contrast.  Multiplanar CT image reconstructions of the cervical spine were also  generated.  Comparison:  Head CT on 09/05/2009  CT HEAD  Findings: There is no evidence of intracranial hemorrhage, brain edema or other signs of acute infarction.  There is no evidence of intracranial mass lesion or mass effect.  No abnormal extra-axial fluid collections are identified.  Mild cerebral atrophy and chronic small vessel disease are unchanged in appearance.  Ventricles remain stable in size.  No evidence of skull fracture.  IMPRESSION:  1.  No acute intracranial abnormality. 2.  Stable mild cerebral atrophy and chronic small vessel disease.  CT CERVICAL SPINE  Findings: No evidence of cervical spine fracture or subluxation.  Mild to moderate degenerative disc disease is seen from levels of C4 to C7.  Moderate facet DJD is also seen bilaterally, left side greater than right.  IMPRESSION:  1. Negative for acute cervical spine fracture or subluxation. 2.  Moderate cervical spondylosis, as described above.   Original Report Authenticated By: Danae Orleans, M.D.    Mr Cervical Spine W Wo Contrast  11/24/2011  *RADIOLOGY REPORT*  Clinical Data: Fall.  Paresthesias.  MRI CERVICAL SPINE WITHOUT AND WITH CONTRAST  Technique:  Multiplanar and multiecho pulse sequences of the cervical spine, to include the craniocervical junction and cervicothoracic junction, were obtained according to standard protocol without and with intravenous contrast.  Contrast: 15mL MULTIHANCE GADOBENATE DIMEGLUMINE 529 MG/ML IV SOLN  Comparison: CT 11/17/2011.  MRI 09/10/2009.  Findings: The foramen magnum is widely patent.  C1-2 and C2-3 are unremarkable except for mild facet degeneration.  C3-4 shows mild bulging of the disc and mild facet degeneration but no significant stenosis.  C4-5 shows mild spondylosis and facet degeneration but no significant stenosis.  There is mild foraminal narrowing on the right.  At C5-6, there is evidence of acute injury with interspinous edema and some edema of the facet joints.  There is mild  prevertebral edema.  There are endplate osteophytes and chronic protrusion of disc material.  The canal is narrowed with an AP diameter of only 6 mm.  There is mild cord edema.  No evidence of cord hemorrhage.  No epidural hematoma.  No definable fracture.  At C6-7, there is spondylosis with endplate osteophytes and bulging of the disc.  No significant canal or foraminal narrowing. There is mild interspinous edema.  At C7-T1, there is mild facet degeneration but no significant stenosis.  The upper thoracic region is unremarkable.  IMPRESSION: Findings most consistent with a hyperflexion injury.  Interspinous edema at the C5-6 level and to a lesser extent at the C6-7 level. No definable fracture or subluxation.  Spondylosis at C5-6 with canal stenosis, AP diameter being only 6 mm.  There is low-level abnormal cord edema consistent with recent cord injury.  No evidence of cord hemorrhage or epidural hematoma.  There is mild prevertebral edema.   Original Report Authenticated By: Thomasenia Sales, M.D.    Dg Hand 2 View Left  11/23/2011  *RADIOLOGY REPORT*  Clinical Data: Left hand pain.  Fall.  LEFT HAND - 2 VIEW  Comparison: None.  Findings: No acute fracture and no dislocation.  Mild degenerative change of the IP joints.  IMPRESSION: No acute bony pathology.  Original Report Authenticated By: Donavan Burnet, M.D.    Dg Chest Port 1 View  12/02/2011  *RADIOLOGY REPORT*  Clinical Data: Fever  PORTABLE CHEST - 1 VIEW  Comparison: 11/17/2011  Findings: Prior CABG.  Negative for heart failure or pneumonia. Lungs are clear.  Negative for pleural effusion.  IMPRESSION: No active cardiopulmonary disease.   Original Report Authenticated By: Camelia Phenes, M.D.    Dg Chest Port 1 View  11/18/2011  *RADIOLOGY REPORT*  Clinical Data: Altered mental status.  Fall.  Pain.  PORTABLE CHEST - 1 VIEW  Comparison: 12/07/2009  Findings: Stable appearance of postoperative changes in the mediastinum.  Shallow inspiration. The  heart size and pulmonary vascularity are normal. The lungs appear clear and expanded without focal air space disease or consolidation. No blunting of the costophrenic angles.  No pneumothorax.  Mediastinal contours appear intact.  Degenerative changes in the spine and shoulders.  No significant change since previous study.  IMPRESSION: Shallow inspiration.  No evidence of active pulmonary disease.   Original Report Authenticated By: Marlon Pel, M.D.    Dg Shoulder Left Port  11/19/2011  *RADIOLOGY REPORT*  Clinical Data: 78 year old female fall 3 days ago.  Pain.  PORTABLE LEFT SHOULDER - 2+ VIEW  Comparison: None.  Findings: Portable AP and scapular Y views. No glenohumeral joint dislocation.  Left clavicle appears intact.  Proximal left humerus appears intact.  Left scapula and visualized left ribs appear grossly intact.  IMPRESSION: No acute fracture or dislocation identified about the left shoulder.   Original Report Authenticated By: Harley Hallmark, M.D.    Dg C-arm 1-60 Min  11/30/2011  *RADIOLOGY REPORT*  Clinical Data: fusion  DG C-ARM 1-60 MIN,DG CERVICAL SPINE - 1 VIEW  Comparison:   the previous day's study  Findings: Single lateral intraoperative cervical radiograph demonstrate changes of instrumented ACDF C5-6.  The cervicothoracic junction is not visualized.  IMPRESSION:  1.  ACDF C5-6   Original Report Authenticated By: Osa Craver, M.D.     Microbiology: Recent Results (from the past 240 hour(s))  CULTURE, BLOOD (ROUTINE X 2)     Status: Normal (Preliminary result)   Collection Time   12/02/11 11:45 PM      Component Value Range Status Comment   Specimen Description BLOOD RIGHT ARM   Final    Special Requests BOTTLES DRAWN AEROBIC AND ANAEROBIC 10CC EACH   Final    Culture  Setup Time 12/03/2011 11:39   Final    Culture     Final    Value:        BLOOD CULTURE RECEIVED NO GROWTH TO DATE CULTURE WILL BE HELD FOR 5 DAYS BEFORE ISSUING A FINAL NEGATIVE REPORT   Report  Status PENDING   Incomplete   URINE CULTURE     Status: Normal   Collection Time   12/03/11  1:10 AM      Component Value Range Status Comment   Specimen Description URINE, CATHETERIZED   Final    Special Requests NONE   Final    Culture  Setup Time 12/03/2011 11:38   Final    Colony Count 2,000 COLONIES/ML   Final    Culture INSIGNIFICANT GROWTH   Final    Report Status 12/04/2011 FINAL   Final      CBC:  Lab 12/02/11 2345  WBC 7.7  NEUTROABS 4.9  HGB 8.8*  HCT 26.7*  MCV 93.4  PLT 247   CBG:  Lab 12/06/11  1610 12/05/11 2116 12/05/11 1650 12/05/11 1204 12/05/11 0630  GLUCAP 96 155* 221* 279* 120*    Time coordinating discharge: More than 30 minutes  Signed:  Glennette Galster  [facility] 12/06/2011, 9:53 AM

## 2011-12-06 ENCOUNTER — Inpatient Hospital Stay (HOSPITAL_COMMUNITY)
Admission: RE | Admit: 2011-12-06 | Discharge: 2011-12-14 | DRG: 945 | Disposition: A | Discharge status: Home Health | Payer: Medicare Other | Source: Ambulatory Visit | Attending: Physical Medicine & Rehabilitation | Admitting: Physical Medicine & Rehabilitation

## 2011-12-06 DIAGNOSIS — R42 Dizziness and giddiness: Secondary | ICD-10-CM

## 2011-12-06 DIAGNOSIS — E1165 Type 2 diabetes mellitus with hyperglycemia: Secondary | ICD-10-CM

## 2011-12-06 DIAGNOSIS — W19XXXA Unspecified fall, initial encounter: Secondary | ICD-10-CM

## 2011-12-06 DIAGNOSIS — IMO0002 Reserved for concepts with insufficient information to code with codable children: Secondary | ICD-10-CM

## 2011-12-06 DIAGNOSIS — M4712 Other spondylosis with myelopathy, cervical region: Secondary | ICD-10-CM

## 2011-12-06 DIAGNOSIS — G959 Disease of spinal cord, unspecified: Secondary | ICD-10-CM

## 2011-12-06 DIAGNOSIS — I2510 Atherosclerotic heart disease of native coronary artery without angina pectoris: Secondary | ICD-10-CM

## 2011-12-06 DIAGNOSIS — Z5189 Encounter for other specified aftercare: Secondary | ICD-10-CM

## 2011-12-06 LAB — GLUCOSE, CAPILLARY
Glucose-Capillary: 96 mg/dL (ref 70–99)
Glucose-Capillary: 166 mg/dL — ABNORMAL HIGH (ref 70–99)
Glucose-Capillary: 120 mg/dL — ABNORMAL HIGH (ref 70–99)
Glucose-Capillary: 99 mg/dL (ref 70–99)

## 2011-12-06 MED ORDER — ONDANSETRON HCL 4 MG PO TABS: 4.0000 mg | ORAL_TABLET | Freq: Four times a day (QID) | ORAL | Status: DC | PRN

## 2011-12-06 MED ORDER — GLYBURIDE 5 MG PO TABS
5.0000 mg | ORAL_TABLET | Freq: Two times a day (BID) | ORAL | Status: DC
  Administered 2011-12-07 (×2): 5 mg via ORAL
  Filled 2011-12-06 (×5): qty 1

## 2011-12-06 MED ORDER — DULOXETINE HCL 30 MG PO CPEP
30.0000 mg | ORAL_CAPSULE | Freq: Every day | ORAL | Status: DC
  Administered 2011-12-07 – 2011-12-14 (×8): 30 mg via ORAL
  Filled 2011-12-06 (×10): qty 1

## 2011-12-06 MED ORDER — NITROGLYCERIN 0.4 MG SL SUBL: 0.4000 mg | SUBLINGUAL_TABLET | SUBLINGUAL | Status: DC | PRN

## 2011-12-06 MED ORDER — ACETAMINOPHEN 325 MG PO TABS: 325.0000 mg | ORAL_TABLET | ORAL | Status: DC | PRN

## 2011-12-06 MED ORDER — POLYETHYLENE GLYCOL 3350 17 G PO PACK
17.0000 g | PACK | Freq: Every day | ORAL | Status: DC | PRN
  Filled 2011-12-06: qty 1

## 2011-12-06 MED ORDER — MENTHOL 3 MG MT LOZG
1.0000 | LOZENGE | OROMUCOSAL | Status: DC | PRN
  Filled 2011-12-06: qty 9

## 2011-12-06 MED ORDER — METFORMIN HCL 500 MG PO TABS
500.0000 mg | ORAL_TABLET | Freq: Every day | ORAL | Status: DC
  Administered 2011-12-07 – 2011-12-14 (×8): 500 mg via ORAL
  Filled 2011-12-06 (×9): qty 1

## 2011-12-06 MED ORDER — ENSURE PUDDING PO PUDG
1.0000 | Freq: Two times a day (BID) | ORAL | Status: DC
  Administered 2011-12-09 – 2011-12-10 (×3): 1 via ORAL

## 2011-12-06 MED ORDER — POLYETHYLENE GLYCOL 3350 17 G PO PACK
17.0000 g | PACK | Freq: Every day | ORAL | Status: DC
  Administered 2011-12-09 – 2011-12-14 (×6): 17 g via ORAL
  Filled 2011-12-06 (×9): qty 1

## 2011-12-06 MED ORDER — GABAPENTIN 300 MG PO CAPS
300.0000 mg | ORAL_CAPSULE | Freq: Three times a day (TID) | ORAL | Status: DC
  Administered 2011-12-06 – 2011-12-12 (×17): 300 mg via ORAL
  Filled 2011-12-06 (×20): qty 1

## 2011-12-06 MED ORDER — LORAZEPAM 0.5 MG PO TABS
0.5000 mg | ORAL_TABLET | Freq: Three times a day (TID) | ORAL | Status: DC | PRN
  Administered 2011-12-08: 0.5 mg via ORAL
  Filled 2011-12-06: qty 1

## 2011-12-06 MED ORDER — AMLODIPINE BESYLATE 10 MG PO TABS
10.0000 mg | ORAL_TABLET | Freq: Every day | ORAL | Status: DC
  Administered 2011-12-06 – 2011-12-12 (×7): 10 mg via ORAL
  Filled 2011-12-06 (×10): qty 1

## 2011-12-06 MED ORDER — METHOCARBAMOL 500 MG PO TABS
500.0000 mg | ORAL_TABLET | Freq: Four times a day (QID) | ORAL | Status: DC | PRN
  Administered 2011-12-07 – 2011-12-13 (×10): 500 mg via ORAL
  Filled 2011-12-06 (×10): qty 1

## 2011-12-06 MED ORDER — PHENOL 1.4 % MT LIQD
1.0000 | OROMUCOSAL | Status: DC | PRN
  Filled 2011-12-06: qty 177

## 2011-12-06 MED ORDER — LINAGLIPTIN 5 MG PO TABS
5.0000 mg | ORAL_TABLET | Freq: Every day | ORAL | Status: DC
  Administered 2011-12-07 – 2011-12-14 (×8): 5 mg via ORAL
  Filled 2011-12-06 (×11): qty 1

## 2011-12-06 MED ORDER — ENSURE COMPLETE PO LIQD
237.0000 mL | Freq: Two times a day (BID) | ORAL | Status: DC
  Administered 2011-12-09 – 2011-12-13 (×8): 237 mL via ORAL

## 2011-12-06 MED ORDER — ONDANSETRON HCL 4 MG/2ML IJ SOLN: 4.0000 mg | Freq: Four times a day (QID) | INTRAMUSCULAR | Status: DC | PRN

## 2011-12-06 MED ORDER — ASPIRIN 81 MG PO CHEW
81.0000 mg | CHEWABLE_TABLET | Freq: Every day | ORAL | Status: DC
  Administered 2011-12-07 – 2011-12-14 (×8): 81 mg via ORAL
  Filled 2011-12-06 (×11): qty 1

## 2011-12-06 MED ORDER — SORBITOL 70 % SOLN: 30.0000 mL | Freq: Every day | Status: DC | PRN

## 2011-12-06 MED ORDER — OXYCODONE HCL 5 MG PO TABS
5.0000 mg | ORAL_TABLET | ORAL | Status: DC | PRN
  Administered 2011-12-07 – 2011-12-14 (×23): 5 mg via ORAL
  Filled 2011-12-06 (×23): qty 1

## 2011-12-06 MED ORDER — ATORVASTATIN CALCIUM 20 MG PO TABS
20.0000 mg | ORAL_TABLET | Freq: Every day | ORAL | Status: DC
  Administered 2011-12-07 – 2011-12-14 (×8): 20 mg via ORAL
  Filled 2011-12-06 (×9): qty 1

## 2011-12-06 MED ORDER — ADULT MULTIVITAMIN W/MINERALS CH
1.0000 | ORAL_TABLET | Freq: Every day | ORAL | Status: DC
  Administered 2011-12-07 – 2011-12-14 (×8): 1 via ORAL
  Filled 2011-12-06 (×9): qty 1

## 2011-12-06 NOTE — H&P (Signed)
Physical Medicine and Rehabilitation Admission H&P    Chief Complaint  Patient presents with  . Fall  . Generalized Body Aches  . Epistaxis  : HPI: [name] is a 78 y.o. right-handed female with history of coronary artery disease, CABG, diabetes mellitus with peripheral neuropathy. Admitted 11/18/2011 after reported fall that was unwitnessed. Patient was a poor historian. Patient with upper extremity weakness noted after fall. Cranial CT scan was negative for acute intracranial abnormalities. Noted creatinine upon admission of 1.48. Carotid Dopplers completed workup of questionable syncopal episode showing no ICA stenosis. MRI cervical spine revealed acute injury to C5-6 with stenosis, signal change in the posterior ligamentous structure, and mild signal change in the cord. Followup neurosurgery plan for ACDF. Surgery delayed secondary to patient being on aspirin Plavix which was held. Underwent decompressive anterior cervical discectomy C5-6 11/30/2011 per Dr. Marikay Alar. Advised cervical collar for comfort. Postoperative pain control. Psychiatry consulted 12/04/2011 for mood stabilization with bouts of anxiety. There is questioned as Celexa and monitor as patient is presently on Cymbalta. Physical and occupational therapy ongoing with recommendations for physical medicine rehabilitation consult to consider inpatient rehabilitation services. Patient was felt to be a good candidate for inpatient rehabilitation services and was admitted for comprehensive rehabilitation program  Review of Systems  Musculoskeletal: Positive for myalgias, back pain and falls.  Neurological: Positive for tingling.  Psychiatric/Behavioral: Positive for memory loss, depression, anxiety   Past Medical History  Diagnosis Date  . Shortness of breath   . CAD (coronary artery disease)     Last catheterization 2009, grafts patent, EF 70%, 80% small OM with possible mild ischemic  . Hx of CABG     2000, LIMA to LAD,  SVG to diagonal, SVG to OM, SVG to posterior descending  . Ejection fraction     EF 70%,, 2009 /  EF 65%, echo, October, 2011  . Hypertension   . Palpitations   . Dyslipidemia   . Diabetes mellitus   . Pancreatitis   . Carotid artery disease     Mild, Doppler, 2000  . Dizziness     Evaluated April, 2008, no treatment needed  . Cervical spine disease     Severe neck pain, 2011  . Hyperkalemia     Her doctor Zachery Dauer, September, 2012, ACE inhibitor stopped, probable amlodipine to be  . Shortness of breath   . History of hysterectomy   . Chest pain     Chest hurting, May, 2013   Past Surgical History  Procedure Date  . Coronary artery bypass graft 2000  . Cardiac catheterization 2009  . Cholecystectomy   . Anterior cervical decomp/discectomy fusion 11/30/2011    Procedure: ANTERIOR CERVICAL DECOMPRESSION/DISCECTOMY FUSION 1 LEVEL;  Surgeon: Tia Alert, MD;  Location: [facility];  Service: Neurosurgery;  Laterality: N/A;  Anterior Cervical Decompression Discectomy Cervical Five-Six   Family History  Problem Relation Age of Onset  . Cancer Mother   . Heart failure Father   . Coronary artery disease Father   . Cancer      siblings  . Heart failure      siblings   Social History:  reports that she has never smoked. She does not have any smokeless tobacco history on file. She reports that she does not drink alcohol or use illicit drugs. Allergies:  Allergies  Allergen Reactions  . Codeine     Unknown   Medications Prior to Admission  Medication Sig Dispense Refill  . aspirin 81 MG tablet  Take 81 mg by mouth daily.        Marland Kitchen atorvastatin (LIPITOR) 20 MG tablet Take 1 tablet (20 mg total) by mouth daily.  90 tablet  2  . furosemide (LASIX) 20 MG tablet TAKE ONE-HALF TABLET BY MOUTH EVERY DAY  45 tablet  2  . glyBURIDE (DIABETA) 5 MG tablet Take 5 mg by mouth 2 (two) times daily with a meal.        . metFORMIN (GLUCOPHAGE) 500 MG tablet Take 2 tabs in the AM and 1 tab in the  PM      . multivitamin (THERAGRAN) per tablet Take 1 tablet by mouth daily.        . nitroGLYCERIN (NITROSTAT) 0.4 MG SL tablet Place 0.4 mg under the tongue every 5 (five) minutes as needed. For chest pain      . sitaGLIPtin (JANUVIA) 100 MG tablet Take 100 mg by mouth daily.        . sodium bicarbonate 650 MG tablet Take 1,300 mg by mouth 2 (two) times daily.      Marland Kitchen DISCONTD: clopidogrel (PLAVIX) 75 MG tablet Take 1 tablet (75 mg total) by mouth daily.  90 tablet  2    Home: Home Living Lives With: Alone Available Help at Discharge: Family;Neighbor Type of Home: House Home Access: Stairs to enter Secretary/administrator of Steps: 2 Entrance Stairs-Rails: Left Home Layout: One level Bathroom Shower/Tub: Counselling psychologist: Yes How Accessible: Accessible via walker Home Adaptive Equipment: Shower chair with back   Functional History: Prior Function Able to Take Stairs?: Yes Driving: Yes Vocation: Retired Comments: Used to own the [employer]  Functional Status:  Mobility: Bed Mobility Bed Mobility: Sit to Supine Rolling Right: 4: Min assist;With rail Rolling Left: 3: Mod assist Right Sidelying to Sit: 1: +2 Total assist;HOB flat Right Sidelying to Sit: Patient Percentage: 60% Supine to Sit: 4: Min guard;HOB flat Sitting - Scoot to Edge of Bed: 5: Supervision Sit to Supine: 4: Min guard;HOB elevated Transfers Transfers: Sit to Stand;Stand to Sit Sit to Stand: 4: Min assist;With upper extremity assist;From chair/3-in-1 Sit to Stand: Patient Percentage: 80% Stand to Sit: 4: Min guard;With upper extremity assist;To bed Stand to Sit: Patient Percentage: 90% Stand Pivot Transfers: Not tested (comment) Ambulation/Gait Ambulation/Gait Assistance: 4: Min assist Ambulation/Gait: Patient Percentage: 90% Ambulation Distance (Feet): 200 Feet Assistive device: Left platform walker Ambulation/Gait Assistance Details: Pt  still with R knee pain with increased ambulation distance.  Needed min assist at times to guide platform walker, especially with turns.  Fatigued after ambulation. Gait Pattern: Step-through pattern;Decreased stride length;Narrow base of support General Gait Details: see above Stairs: No Wheelchair Mobility Wheelchair Mobility: No  ADL: ADL Eating/Feeding: Performed;Minimal assistance;Min guard;Other (comment) (using AE for feeding. built up tubing, sippy cup, plate guar) Where Assessed - Eating/Feeding: Bed level Grooming: Performed;Brushing hair;Minimal assistance (with RUE) Where Assessed - Grooming: Unsupported sitting Upper Body Bathing: Simulated;Maximal assistance Where Assessed - Upper Body Bathing: Supine, head of bed up Lower Body Bathing: Simulated;+1 Total assistance Where Assessed - Lower Body Bathing: Supine, head of bed up;Lean right and/or left Upper Body Dressing: Simulated;Maximal assistance Where Assessed - Upper Body Dressing: Supine, head of bed up Lower Body Dressing: Simulated;+1 Total assistance Where Assessed - Lower Body Dressing: Supine, head of bed up;Rolling right and/or left Toilet Transfer: Simulated;Minimal assistance Toilet Transfer Method: Sit to stand Toilet Transfer Equipment:  (Bed to recliner about 3 feet away from each other) Equipment Used:  (  soft c collar) Transfers/Ambulation Related to ADLs: Min A ADL Comments: Pt able to push soft touch call bell. Pt also able to turn on/off phone now with RUE, turn it over and put it up to her ear on the right.   Cognition: Cognition Arousal/Alertness: Awake/alert Orientation Level: Oriented X4 Cognition Overall Cognitive Status: Appears within functional limits for tasks assessed/performed Arousal/Alertness: Awake/alert Orientation Level: Appears intact for tasks assessed Behavior During Session: [facility] for tasks performed   Blood pressure 100/49, pulse 72, temperature 98.4 F (36.9 C), temperature  source Oral, resp. rate 20, height 5\' 6"  (1.676 m), weight 64.3 kg (141 lb 12.1 oz), SpO2 92.00%. Physical Exam  Vitals reviewed.  HENT:  Head: Normocephalic.  Eyes:  Pupils round and reactive to light  Neck:  Soft cervical collar in place  Cardiovascular: Normal rate and regular rhythm.  Pulmonary/Chest: Breath sounds normal. She has no wheezes.  Abdominal: Bowel sounds are normal. She exhibits no distension. There is no tenderness.  Musculoskeletal: She exhibits no edema. Tender around surgical site and bilateral traps Neurological: She is alert.  Patient is oriented to name, year and place. He had some difficulty recalling full of dense related to her fall. She follows two-step commands  Psychiatric: She has a flat affect.  Ataxia FNF in BUE  Motor 3/5 in B Delt, 3+ bi,tri, grip 3,  3-4 B HF,KE,ADF with some pain noted Sensory mildly reduced Lt touch in hands left more than right. Minimal sensory loss in the legs.   Results for orders placed during the hospital encounter of 11/17/11 (from the past 48 hour(s))  GLUCOSE, CAPILLARY     Status: Abnormal   Collection Time   12/04/11 12:09 PM      Component Value Range Comment   Glucose-Capillary 217 (*) 70 - 99 mg/dL    Comment 1 Documented in Chart      Comment 2 Notify RN     GLUCOSE, CAPILLARY     Status: Abnormal   Collection Time   12/04/11  4:12 PM      Component Value Range Comment   Glucose-Capillary 307 (*) 70 - 99 mg/dL    Comment 1 Documented in Chart      Comment 2 Notify RN     GLUCOSE, CAPILLARY     Status: Abnormal   Collection Time   12/04/11 10:21 PM      Component Value Range Comment   Glucose-Capillary 121 (*) 70 - 99 mg/dL    Comment 1 Documented in Chart      Comment 2 Notify RN     GLUCOSE, CAPILLARY     Status: Abnormal   Collection Time   12/05/11  6:30 AM      Component Value Range Comment   Glucose-Capillary 120 (*) 70 - 99 mg/dL    Comment 1 Notify RN      Comment 2 Documented in Chart       GLUCOSE, CAPILLARY     Status: Abnormal   Collection Time   12/05/11 12:04 PM      Component Value Range Comment   Glucose-Capillary 279 (*) 70 - 99 mg/dL   GLUCOSE, CAPILLARY     Status: Abnormal   Collection Time   12/05/11  4:50 PM      Component Value Range Comment   Glucose-Capillary 221 (*) 70 - 99 mg/dL   GLUCOSE, CAPILLARY     Status: Abnormal   Collection Time   12/05/11  9:16 PM  Component Value Range Comment   Glucose-Capillary 155 (*) 70 - 99 mg/dL    Comment 1 Documented in Chart      Comment 2 Notify RN     GLUCOSE, CAPILLARY     Status: Normal   Collection Time   12/06/11  6:48 AM      Component Value Range Comment   Glucose-Capillary 96  70 - 99 mg/dL    Dg Knee Complete 4 Views Right  12/05/2011  *RADIOLOGY REPORT*  Clinical Data: Knee pain, fall.  RIGHT KNEE - COMPLETE 4+ VIEW  Comparison: None.  Findings: Moderate degenerative changes with joint space narrowing and mild spurring. No acute bony abnormality.  Specifically, no fracture, subluxation, or dislocation.  Soft tissues are intact. Small joint effusion suspected.  IMPRESSION: Mild degenerative changes and small joint effusion.  No acute bony abnormality.   Original Report Authenticated By: Cyndie Chime, M.D.     Post Admission Physician Evaluation: 1. Functional deficits secondary  to cervical myelopathy with a C5-6 central cord picture, right knee contusion/OA/prepatellar bursitis. 2. Patient is admitted to receive collaborative, interdisciplinary care between the physiatrist, rehab nursing staff, and therapy team. 3. Patient's level of medical complexity and substantial therapy needs in context of that medical necessity cannot be provided at a lesser intensity of care such as a SNF. 4. Patient has experienced substantial functional loss from his/her baseline which was documented above under the "Functional History" and "Functional Status" headings.  Judging by the patient's diagnosis, physical exam, and  functional history, the patient has potential for functional progress which will result in measurable gains while on inpatient rehab.  These gains will be of substantial and practical use upon discharge  in facilitating mobility and self-care at the household level. 5. Physiatrist will provide 24 hour management of medical needs as well as oversight of the therapy plan/treatment and provide guidance as appropriate regarding the interaction of the two. 6. 24 hour rehab nursing will assist with bladder management, bowel management, safety, skin/wound care, disease management, medication administration, pain management and patient education  and help integrate therapy concepts, techniques,education, etc. 7. PT will assess and treat for:  fxnl mobility, adaptive equipment, balance, safety, pain.  Goals are: supervision to mod I. 8. OT will assess and treat for: UES, ROM, fxnl mobility, NMR, adaptive equipment and techniques.   Goals are: mod I to minimal assist. 9. SLP will assess and treat for: n/a.  Goals are: n/a. 10. Case Management and Social Worker will assess and treat for psychological issues and discharge planning. 11. Team conference will be held weekly to assess progress toward goals and to determine barriers to discharge. 12. Patient will receive at least 3 hours of therapy per day at least 5 days per week. 13. ELOS: 10 days      Prognosis:  excellent   Medical Problem List and Plan: 1. Cervical myelopathy status post ACDF 11/30/2011 2. DVT Prophylaxis/Anticoagulation: SCDs. Monitor for any signs of DVT 3. Pain Management: Neurontin 300 mg 3 times a day, Robaxin and oxycodone as needed. Monitor with increased mobility  -Right knee may be slightly contused, or this may be a prepatellar bursitis   -utilize ice, voltaren gel for knee 4. Mood: Cymbalta 30 mg daily, Ativan 0.5 mg every 8 hours as needed. Provide emotional support and positive reinforcement 5. Neuropsych: This patient is capable  of making decisions on his/her own behalf. 6. Coronary artery disease a coronary artery bypass grafting. Aspirin 81 mg daily resumed. Resume  Plavix 75 mg daily in 2 weeks 7. diabetes mellitus. Hemoglobin A1c 7.5.Glyburide 5 mg twice a day, Glucophage 500 mg daily, Januvia 100 mg daily. Check blood sugars a.c. and at bedtime 8. Hypertension. Norvasc 10 mg daily. Monitor with increased mobility 9. Hyperlipidemia. Lipitor  12/06/2011, Ivory Broad, MD

## 2011-12-06 NOTE — Progress Notes (Signed)
Report received from [place], [place]. 1745 Pt. Arrived to rehab unit; RM [id number] from 4N01.  VSS, AOX3; family present at bedside.  Pt. And family oriented to rehab.

## 2011-12-06 NOTE — Interval H&P Note (Signed)
Azie Mcconahy was admitted today to Inpatient Rehabilitation with the diagnosis of cervical myelopathy, C5-6 central cord injury.  The patient's history has been reviewed, patient examined, and there is no change in status.  Patient continues to be appropriate for intensive inpatient rehabilitation.  I have reviewed the patient's chart and labs.  Questions were answered to the patient's satisfaction.  SWARTZ,ZACHARY T 12/06/2011, 8:52 PM

## 2011-12-06 NOTE — Plan of Care (Signed)
Overall Plan of Care [facility]) Patient Details Name: [name] MRN: [id number] DOB: 1928/04/10  Diagnosis:  Cervical stenosis, myelopathy  Primary Diagnosis:    Myelopathy Co-morbidities: CAD, DM, Right knee contusion  Functional Problem List  Patient demonstrates impairments in the following areas: Balance, Bladder, Bowel, Endurance, Medication Management, Motor, Nutrition, Pain, Safety, Sensory  and Skin Integrity  Basic ADL's: eating, grooming, bathing, dressing and toileting Advanced ADL's: simple meal preparation and light housekeeping  Transfers:  bed mobility, bed to chair, toilet, tub/shower, car and furniture Locomotion:  ambulation and stairs  Additional Impairments:  Leisure Awareness  Anticipated Outcomes Item Anticipated Outcome  Eating/Swallowing  Min assist  Basic self-care  Mod I  Tolieting  Mod I  Bowel/Bladder  Pt. Will be continent of bladder; min assist  Transfers  Mod I basic, S car  Locomotion  S gait and stairs  Communication    Cognition    Pain  Pain will be managed at 3 or less with medication; min assist  Safety/Judgment  Min assist/ supervision  Other     Therapy Plan: PT Frequency: 1-2 X/day, 60-90 minutes OT Frequency: 1-2 X/day, 60-90 minutes     Team Interventions: Item RN PT OT SLP SW TR Other  Self Care/Advanced ADL Retraining  x x      Neuromuscular Re-Education  x x      Therapeutic Activities  x x      UE/LE Strength Training/ROM  x x      UE/LE Coordination Activities  x x      Visual/Perceptual Remediation/Compensation         DME/Adaptive Equipment Instruction  x x      Therapeutic Exercise  x x      Balance/Vestibular Training  x x      Patient/Family Education x x x      Cognitive Remediation/Compensation         Functional Mobility Training  x x      Ambulation/Gait Training  x x      Stair Training  x       Wheelchair Propulsion/Positioning  x x      Functional Tourist information centre manager  Reintegration  x x      Dysphagia/Aspiration Film/video editor         Bladder Management x        Bowel Management x        Disease Management/Prevention x        Pain Management x x x      Medication Management x        Skin Care/Wound Management x x x      Splinting/Orthotics  x x      Discharge Planning  x x      Psychosocial Support x x x                         Team Discharge Planning: Destination:  Home Projected Follow-up:  PT and Home Health, OT: TBD Projected Equipment Needs:  Walker, Tub Transfer bench, and BSC Patient/family involved in discharge planning:  Yes  MD ELOS: 7-10 days Medical Rehab Prognosis:  Excellent Assessment: Pt admitted for CIR therapies. The team will be addressing self-care, NMR, strength, adaptive techniques and equipment. The patient does have some pain in the neck and right knee which are limiting. Goals are mod I  for mobility and Mod I to min assist for ADL's.

## 2011-12-06 NOTE — Progress Notes (Signed)
CIR admit: Spoke w/ pt & daughter, Synetta Fail, & w/ pt's acute CM & MD & w/ pt's insurance about bed available on CIR today.  Plan for CIR admit today.  [phone]

## 2011-12-06 NOTE — Progress Notes (Signed)
12/06/11 1000  PT Visit Information  Last PT Received On 12/06/11  Assistance Needed +1  PT Time Calculation  PT Start Time 1006  PT Stop Time 1030  PT Time Calculation (min) 24 min  Subjective Data  Subjective "I am going to rehab today."  Precautions  Precautions Fall  Precaution Comments Pt with hypersensitivity to touch (pain) left arm from elbow distally (better than it wa prior to ACDF, but still an issue)  Required Braces or Orthoses Cervical Brace  Cervical Brace Soft collar  Restrictions  Weight Bearing Restrictions No  Cognition  Overall Cognitive Status Appears within functional limits for tasks assessed/performed  Arousal/Alertness Awake/alert  Orientation Level Appears intact for tasks assessed  Behavior During Session [facility] for tasks performed  Bed Mobility  Bed Mobility Not assessed  Transfers  Transfers Sit to Stand;Stand to Sit  Sit to Stand 4: Min assist;With upper extremity assist;With armrests;From chair/3-in-1;From toilet  Stand to Sit 4: Min assist;With upper extremity assist;With armrests;To chair/3-in-1;To toilet  Details for Transfer Assistance cues for hand and LE placement. Poor control of descent.  Needs physical assist to come to standing from low position.  Tends to rely on L side more.  Ambulation/Gait  Ambulation/Gait Assistance 4: Min assist  Ambulation Distance (Feet) 180 Feet (then 20)  Assistive device Left platform walker  Ambulation/Gait Assistance Details Needs physical assist to maneuver platform RW at times, especially with turns.  LOB x 1 that required min assist to recover.  Fatiques quickly.  Gait Pattern Step-through pattern;Decreased stride length;Scissoring;Narrow base of support (R LE externally rotated)  Gait velocity decreased  General Gait Details scissoring worse today  Stairs No  Wheelchair Mobility  Wheelchair Mobility No  Balance  Balance Assessed No  PT - End of Session  Equipment Utilized During Treatment Gait  belt;Cervical collar  Activity Tolerance Patient limited by fatigue;Patient limited by pain  Patient left in chair;with call bell/phone within reach  Nurse Communication Mobility status  PT - Assessment/Plan  Comments on Treatment Session Pt's gait with more unsteadiness today and increased scissoring of LE's.  Motivated to increase mobility and is eager to go to rehab for continued therapy.  Continues with pain, especially in UE's and neck.  Noted xray of knee (-) for fracture.  PT Plan Discharge plan remains appropriate;Frequency remains appropriate  PT Frequency Min 3X/week  Follow Up Recommendations Inpatient Rehab  Equipment Recommended Rolling walker with 5" wheels;3 in 1 bedside comode  Acute Rehab PT Goals  PT Goal: Sit to Stand - Progress Progressing toward goal  PT Goal: Stand to Sit - Progress Progressing toward goal  PT Goal: Ambulate - Progress Progressing toward goal  PT General Charges  $$ ACUTE PT VISIT 1 Procedure  PT Treatments  $Gait Training 23-37 mins   [place]ell [place], [place] Acute Rehab [phone] (office)

## 2011-12-06 NOTE — H&P (View-Only) (Signed)
Physical Medicine and Rehabilitation Admission H&P    Chief Complaint  Patient presents with  . Fall  . Generalized Body Aches  . Epistaxis  : HPI: [name] is a 78 y.o. right-handed female with history of coronary artery disease, CABG, diabetes mellitus with peripheral neuropathy. Admitted 11/18/2011 after reported fall that was unwitnessed. Patient was a poor historian. Patient with upper extremity weakness noted after fall. Cranial CT scan was negative for acute intracranial abnormalities. Noted creatinine upon admission of 1.48. Carotid Dopplers completed workup of questionable syncopal episode showing no ICA stenosis. MRI cervical spine revealed acute injury to C5-6 with stenosis, signal change in the posterior ligamentous structure, and mild signal change in the cord. Followup neurosurgery plan for ACDF. Surgery delayed secondary to patient being on aspirin Plavix which was held. Underwent decompressive anterior cervical discectomy C5-6 11/30/2011 per Dr. David Jones. Advised cervical collar for comfort. Postoperative pain control. Psychiatry consulted 12/04/2011 for mood stabilization with bouts of anxiety. There is questioned as Celexa and monitor as patient is presently on Cymbalta. Physical and occupational therapy ongoing with recommendations for physical medicine rehabilitation consult to consider inpatient rehabilitation services. Patient was felt to be a good candidate for inpatient rehabilitation services and was admitted for comprehensive rehabilitation program  Review of Systems  Musculoskeletal: Positive for myalgias, back pain and falls.  Neurological: Positive for tingling.  Psychiatric/Behavioral: Positive for memory loss, depression, anxiety   Past Medical History  Diagnosis Date  . Shortness of breath   . CAD (coronary artery disease)     Last catheterization 2009, grafts patent, EF 70%, 80% small OM with possible mild ischemic  . Hx of CABG     2000, LIMA to LAD,  SVG to diagonal, SVG to OM, SVG to posterior descending  . Ejection fraction     EF 70%,, 2009 /  EF 65%, echo, October, 2011  . Hypertension   . Palpitations   . Dyslipidemia   . Diabetes mellitus   . Pancreatitis   . Carotid artery disease     Mild, Doppler, 2000  . Dizziness     Evaluated April, 2008, no treatment needed  . Cervical spine disease     Severe neck pain, 2011  . Hyperkalemia     Her doctor Barnes, September, 2012, ACE inhibitor stopped, probable amlodipine to be  . Shortness of breath   . History of hysterectomy   . Chest pain     Chest hurting, May, 2013   Past Surgical History  Procedure Date  . Coronary artery bypass graft 2000  . Cardiac catheterization 2009  . Cholecystectomy   . Anterior cervical decomp/discectomy fusion 11/30/2011    Procedure: ANTERIOR CERVICAL DECOMPRESSION/DISCECTOMY FUSION 1 LEVEL;  Surgeon: David S Jones, MD;  Location: [facility];  Service: Neurosurgery;  Laterality: N/A;  Anterior Cervical Decompression Discectomy Cervical Five-Six   Family History  Problem Relation Age of Onset  . Cancer Mother   . Heart failure Father   . Coronary artery disease Father   . Cancer      siblings  . Heart failure      siblings   Social History:  reports that she has never smoked. She does not have any smokeless tobacco history on file. She reports that she does not drink alcohol or use illicit drugs. Allergies:  Allergies  Allergen Reactions  . Codeine     Unknown   Medications Prior to Admission  Medication Sig Dispense Refill  . aspirin 81 MG tablet   Take 81 mg by mouth daily.        . atorvastatin (LIPITOR) 20 MG tablet Take 1 tablet (20 mg total) by mouth daily.  90 tablet  2  . furosemide (LASIX) 20 MG tablet TAKE ONE-HALF TABLET BY MOUTH EVERY DAY  45 tablet  2  . glyBURIDE (DIABETA) 5 MG tablet Take 5 mg by mouth 2 (two) times daily with a meal.        . metFORMIN (GLUCOPHAGE) 500 MG tablet Take 2 tabs in the AM and 1 tab in the  PM      . multivitamin (THERAGRAN) per tablet Take 1 tablet by mouth daily.        . nitroGLYCERIN (NITROSTAT) 0.4 MG SL tablet Place 0.4 mg under the tongue every 5 (five) minutes as needed. For chest pain      . sitaGLIPtin (JANUVIA) 100 MG tablet Take 100 mg by mouth daily.        . sodium bicarbonate 650 MG tablet Take 1,300 mg by mouth 2 (two) times daily.      . DISCONTD: clopidogrel (PLAVIX) 75 MG tablet Take 1 tablet (75 mg total) by mouth daily.  90 tablet  2    Home: Home Living Lives With: Alone Available Help at Discharge: Family;Neighbor Type of Home: House Home Access: Stairs to enter Entrance Stairs-Number of Steps: 2 Entrance Stairs-Rails: Left Home Layout: One level Bathroom Shower/Tub: Tub/shower unit;Curtain Bathroom Toilet: Standard Bathroom Accessibility: Yes How Accessible: Accessible via walker Home Adaptive Equipment: Shower chair with back   Functional History: Prior Function Able to Take Stairs?: Yes Driving: Yes Vocation: Retired Comments: Used to own the [address]  Functional Status:  Mobility: Bed Mobility Bed Mobility: Sit to Supine Rolling Right: 4: Min assist;With rail Rolling Left: 3: Mod assist Right Sidelying to Sit: 1: +2 Total assist;HOB flat Right Sidelying to Sit: Patient Percentage: 60% Supine to Sit: 4: Min guard;HOB flat Sitting - Scoot to Edge of Bed: 5: Supervision Sit to Supine: 4: Min guard;HOB elevated Transfers Transfers: Sit to Stand;Stand to Sit Sit to Stand: 4: Min assist;With upper extremity assist;From chair/3-in-1 Sit to Stand: Patient Percentage: 80% Stand to Sit: 4: Min guard;With upper extremity assist;To bed Stand to Sit: Patient Percentage: 90% Stand Pivot Transfers: Not tested (comment) Ambulation/Gait Ambulation/Gait Assistance: 4: Min assist Ambulation/Gait: Patient Percentage: 90% Ambulation Distance (Feet): 200 Feet Assistive device: Left platform walker Ambulation/Gait Assistance Details: Pt  still with R knee pain with increased ambulation distance.  Needed min assist at times to guide platform walker, especially with turns.  Fatigued after ambulation. Gait Pattern: Step-through pattern;Decreased stride length;Narrow base of support General Gait Details: see above Stairs: No Wheelchair Mobility Wheelchair Mobility: No  ADL: ADL Eating/Feeding: Performed;Minimal assistance;Min guard;Other (comment) (using AE for feeding. built up tubing, sippy cup, plate guar) Where Assessed - Eating/Feeding: Bed level Grooming: Performed;Brushing hair;Minimal assistance (with RUE) Where Assessed - Grooming: Unsupported sitting Upper Body Bathing: Simulated;Maximal assistance Where Assessed - Upper Body Bathing: Supine, head of bed up Lower Body Bathing: Simulated;+1 Total assistance Where Assessed - Lower Body Bathing: Supine, head of bed up;Lean right and/or left Upper Body Dressing: Simulated;Maximal assistance Where Assessed - Upper Body Dressing: Supine, head of bed up Lower Body Dressing: Simulated;+1 Total assistance Where Assessed - Lower Body Dressing: Supine, head of bed up;Rolling right and/or left Toilet Transfer: Simulated;Minimal assistance Toilet Transfer Method: Sit to stand Toilet Transfer Equipment:  (Bed to recliner about 3 feet away from each other) Equipment Used:  (  soft c collar) Transfers/Ambulation Related to ADLs: Min A ADL Comments: Pt able to push soft touch call bell. Pt also able to turn on/off phone now with RUE, turn it over and put it up to her ear on the right.   Cognition: Cognition Arousal/Alertness: Awake/alert Orientation Level: Oriented X4 Cognition Overall Cognitive Status: Appears within functional limits for tasks assessed/performed Arousal/Alertness: Awake/alert Orientation Level: Appears intact for tasks assessed Behavior During Session: WFL for tasks performed   Blood pressure 100/49, pulse 72, temperature 98.4 F (36.9 C), temperature  source Oral, resp. rate 20, height 5' 6" (1.676 m), weight 64.3 kg (141 lb 12.1 oz), SpO2 92.00%. Physical Exam  Vitals reviewed.  HENT:  Head: Normocephalic.  Eyes:  Pupils round and reactive to light  Neck:  Soft cervical collar in place  Cardiovascular: Normal rate and regular rhythm.  Pulmonary/Chest: Breath sounds normal. She has no wheezes.  Abdominal: Bowel sounds are normal. She exhibits no distension. There is no tenderness.  Musculoskeletal: She exhibits no edema. Tender around surgical site and bilateral traps Neurological: She is alert.  Patient is oriented to name, year and place. He had some difficulty recalling full of dense related to her fall. She follows two-step commands  Psychiatric: She has a flat affect.  Ataxia FNF in BUE  Motor 3/5 in B Delt, 3+ bi,tri, grip 3,  3-4 B HF,KE,ADF with some pain noted Sensory mildly reduced Lt touch in hands left more than right. Minimal sensory loss in the legs.   Results for orders placed during the hospital encounter of 11/17/11 (from the past 48 hour(s))  GLUCOSE, CAPILLARY     Status: Abnormal   Collection Time   12/04/11 12:09 PM      Component Value Range Comment   Glucose-Capillary 217 (*) 70 - 99 mg/dL    Comment 1 Documented in Chart      Comment 2 Notify RN     GLUCOSE, CAPILLARY     Status: Abnormal   Collection Time   12/04/11  4:12 PM      Component Value Range Comment   Glucose-Capillary 307 (*) 70 - 99 mg/dL    Comment 1 Documented in Chart      Comment 2 Notify RN     GLUCOSE, CAPILLARY     Status: Abnormal   Collection Time   12/04/11 10:21 PM      Component Value Range Comment   Glucose-Capillary 121 (*) 70 - 99 mg/dL    Comment 1 Documented in Chart      Comment 2 Notify RN     GLUCOSE, CAPILLARY     Status: Abnormal   Collection Time   12/05/11  6:30 AM      Component Value Range Comment   Glucose-Capillary 120 (*) 70 - 99 mg/dL    Comment 1 Notify RN      Comment 2 Documented in Chart       GLUCOSE, CAPILLARY     Status: Abnormal   Collection Time   12/05/11 12:04 PM      Component Value Range Comment   Glucose-Capillary 279 (*) 70 - 99 mg/dL   GLUCOSE, CAPILLARY     Status: Abnormal   Collection Time   12/05/11  4:50 PM      Component Value Range Comment   Glucose-Capillary 221 (*) 70 - 99 mg/dL   GLUCOSE, CAPILLARY     Status: Abnormal   Collection Time   12/05/11  9:16 PM        Component Value Range Comment   Glucose-Capillary 155 (*) 70 - 99 mg/dL    Comment 1 Documented in Chart      Comment 2 Notify RN     GLUCOSE, CAPILLARY     Status: Normal   Collection Time   12/06/11  6:48 AM      Component Value Range Comment   Glucose-Capillary 96  70 - 99 mg/dL    Dg Knee Complete 4 Views Right  12/05/2011  *RADIOLOGY REPORT*  Clinical Data: Knee pain, fall.  RIGHT KNEE - COMPLETE 4+ VIEW  Comparison: None.  Findings: Moderate degenerative changes with joint space narrowing and mild spurring. No acute bony abnormality.  Specifically, no fracture, subluxation, or dislocation.  Soft tissues are intact. Small joint effusion suspected.  IMPRESSION: Mild degenerative changes and small joint effusion.  No acute bony abnormality.   Original Report Authenticated By: KEVIN G. DOVER, M.D.     Post Admission Physician Evaluation: 1. Functional deficits secondary  to cervical myelopathy with a C5-6 central cord picture, right knee contusion/OA/prepatellar bursitis. 2. Patient is admitted to receive collaborative, interdisciplinary care between the physiatrist, rehab nursing staff, and therapy team. 3. Patient's level of medical complexity and substantial therapy needs in context of that medical necessity cannot be provided at a lesser intensity of care such as a SNF. 4. Patient has experienced substantial functional loss from his/her baseline which was documented above under the "Functional History" and "Functional Status" headings.  Judging by the patient's diagnosis, physical exam, and  functional history, the patient has potential for functional progress which will result in measurable gains while on inpatient rehab.  These gains will be of substantial and practical use upon discharge  in facilitating mobility and self-care at the household level. 5. Physiatrist will provide 24 hour management of medical needs as well as oversight of the therapy plan/treatment and provide guidance as appropriate regarding the interaction of the two. 6. 24 hour rehab nursing will assist with bladder management, bowel management, safety, skin/wound care, disease management, medication administration, pain management and patient education  and help integrate therapy concepts, techniques,education, etc. 7. PT will assess and treat for:  fxnl mobility, adaptive equipment, balance, safety, pain.  Goals are: supervision to mod I. 8. OT will assess and treat for: UES, ROM, fxnl mobility, NMR, adaptive equipment and techniques.   Goals are: mod I to minimal assist. 9. SLP will assess and treat for: n/a.  Goals are: n/a. 10. Case Management and Social Worker will assess and treat for psychological issues and discharge planning. 11. Team conference will be held weekly to assess progress toward goals and to determine barriers to discharge. 12. Patient will receive at least 3 hours of therapy per day at least 5 days per week. 13. ELOS: 10 days      Prognosis:  excellent   Medical Problem List and Plan: 1. Cervical myelopathy status post ACDF 11/30/2011 2. DVT Prophylaxis/Anticoagulation: SCDs. Monitor for any signs of DVT 3. Pain Management: Neurontin 300 mg 3 times a day, Robaxin and oxycodone as needed. Monitor with increased mobility  -Right knee may be slightly contused, or this may be a prepatellar bursitis   -utilize ice, voltaren gel for knee 4. Mood: Cymbalta 30 mg daily, Ativan 0.5 mg every 8 hours as needed. Provide emotional support and positive reinforcement 5. Neuropsych: This patient is capable  of making decisions on his/her own behalf. 6. Coronary artery disease a coronary artery bypass grafting. Aspirin 81 mg daily resumed. Resume   Plavix 75 mg daily in 2 weeks 7. diabetes mellitus. Hemoglobin A1c 7.5.Glyburide 5 mg twice a day, Glucophage 500 mg daily, Januvia 100 mg daily. Check blood sugars a.c. and at bedtime 8. Hypertension. Norvasc 10 mg daily. Monitor with increased mobility 9. Hyperlipidemia. Lipitor  12/06/2011, Zach Saman Umstead, MD 

## 2011-12-06 NOTE — PMR Pre-admission (Signed)
PMR Admission Coordinator Pre-Admission Assessment  Patient: [name] is an 78 y.o., female MRN: [id number] DOB: 1929/02/16 Height: 5\' 6"  (167.6 cm) Weight: 64.3 kg (141 lb 12.1 oz)  Insurance Information HMO: yes    PPO:       PCP:       IPA:       80/20:       OTHER:   PRIMARY: A[employer] Medicare      Policy#: [id number]      Subscriber: pt CM Name: Bertram Denver      Phone#: [phone]     Fax#:   Pre-Cert#: [id number]      Employer: retired Benefits:  Phone #: [phone]     Name: [name]. Date: 03-14-11     Deduct: 0      Out of Pocket Max: $3900.00  [0 met]     Life Max: none CIR: $220.00/day days 1-7; 0/day days 8+      SNF: $50/day days 1-20; $150/day days 21-40; 0/day days 41-100  [100 days max] Outpatient:      Co-Pay: $40.00/visit Home Health:  Needs precert        DME: 80%     Co-Pay: 20% Providers: in network  SECONDARY: Medicaid [place] Access      Policy#: [id number]      Subscriber: pt   Emergency Contact Information Contact Information    Name Relation Home Work Mobile   Polson Daughter [phone]       Current Medical History  Patient Admitting Diagnosis: Cervical myelopathy s/p ACDF  History of Present Illness: 78 y.o. right-handed female with history of coronary artery disease, CABG, diabetes mellitus with peripheral neuropathy. Admitted 11/18/2011 after reported fall that was unwitnessed. Patient was a poor historian. Patient with upper extremity weakness noted after fall. Cranial CT scan was negative for acute intracranial abnormalities. Noted creatinine upon admission of 1.48. Carotid Dopplers completed workup of questionable syncopal episode showing no ICA stenosis. MRI cervical spine revealed acute injury to C5-6 with stenosis, signal change in the posterior ligamentous structure, and mild signal change in the cord. Followup neurosurgery plan for ACDF. Surgery delayed secondary to patient being on aspirin Plavix which was held. Underwent decompressive  anterior cervical discectomy C5-6 11/30/2011 per Dr. Marikay Alar. Advised cervical collar for comfort. Postoperative pain control. Physical and occupational therapy ongoing.       Past Medical History  Past Medical History  Diagnosis Date  . Shortness of breath   . CAD (coronary artery disease)     Last catheterization 2009, grafts patent, EF 70%, 80% small OM with possible mild ischemic  . Hx of CABG     2000, LIMA to LAD, SVG to diagonal, SVG to OM, SVG to posterior descending  . Ejection fraction     EF 70%,, 2009 /  EF 65%, echo, October, 2011  . Hypertension   . Palpitations   . Dyslipidemia   . Diabetes mellitus   . Pancreatitis   . Carotid artery disease     Mild, Doppler, 2000  . Dizziness     Evaluated April, 2008, no treatment needed  . Cervical spine disease     Severe neck pain, 2011  . Hyperkalemia     Her doctor Zachery Dauer, September, 2012, ACE inhibitor stopped, probable amlodipine to be  . Shortness of breath   . History of hysterectomy   . Chest pain     Chest hurting, May, 2013    Family History  family history includes Cancer  in her mother and unspecified family member; Coronary artery disease in her father; and Heart failure in her father and unspecified family member.  Prior Rehab/Hospitalizations: none   Current Medications  Current facility-administered medications:0.9 % NaCl with KCl 20 mEq/ L  infusion, , Intravenous, Continuous, Vassie Loll, MD, Last Rate: 75 mL/hr at 12/02/11 0600, 75 mL/hr at 12/02/11 0600;  acetaminophen (TYLENOL) suppository 650 mg, 650 mg, Rectal, Q4H PRN, Tia Alert, MD;  acetaminophen (TYLENOL) tablet 650 mg, 650 mg, Oral, Q4H PRN, Tia Alert, MD, 650 mg at 12/02/11 2239 amLODipine (NORVASC) tablet 10 mg, 10 mg, Oral, QHS, Dorothea Ogle, MD, 10 mg at 12/05/11 2154;  atorvastatin (LIPITOR) tablet 20 mg, 20 mg, Oral, Daily, Huey Bienenstock, MD, 20 mg at 12/06/11 1610;  docusate sodium (COLACE) capsule 100 mg, 100 mg, Oral,  BID, Dorothea Ogle, MD, 100 mg at 12/05/11 2205;  DULoxetine (CYMBALTA) DR capsule 30 mg, 30 mg, Oral, Daily, Vassie Loll, MD, 30 mg at 12/06/11 9604 feeding supplement (ENSURE COMPLETE) liquid 237 mL, 237 mL, Oral, BID BM, Haynes Bast, RD, 237 mL at 12/05/11 1413;  feeding supplement (ENSURE) pudding 1 Container, 1 Container, Oral, BID WC, Haynes Bast, RD, 1 Container at 12/06/11 5409;  gabapentin (NEURONTIN) capsule 300 mg, 300 mg, Oral, TID, Tia Alert, MD, 300 mg at 12/06/11 8119 insulin aspart (novoLOG) injection 0-5 Units, 0-5 Units, Subcutaneous, QHS, Altha Harm, MD, 2 Units at 12/03/11 2206;  insulin aspart (novoLOG) injection 0-9 Units, 0-9 Units, Subcutaneous, TID WC, Altha Harm, MD, 2 Units at 12/06/11 1131;  insulin glargine (LANTUS) injection 15 Units, 15 Units, Subcutaneous, Daily, Henderson Cloud, MD, 15 Units at 12/06/11 1478 LORazepam (ATIVAN) tablet 0.5 mg, 0.5 mg, Oral, Q8H PRN, Henderson Cloud, MD, 0.5 mg at 12/05/11 2020;  menthol-cetylpyridinium (CEPACOL) lozenge 3 mg, 1 lozenge, Oral, PRN, Tia Alert, MD;  methocarbamol (ROBAXIN) tablet 500 mg, 500 mg, Oral, Q6H PRN, Tia Alert, MD, 500 mg at 12/06/11 2956;  morphine 2 MG/ML injection 2 mg, 2 mg, Intravenous, Q2H PRN, Henderson Cloud, MD, 2 mg at 12/03/11 2130 multivitamin with minerals tablet 1 tablet, 1 tablet, Oral, Daily, Huey Bienenstock, MD, 1 tablet at 12/06/11 8657;  nitroGLYCERIN (NITROSTAT) SL tablet 0.4 mg, 0.4 mg, Sublingual, Q5 min PRN, Huey Bienenstock, MD;  ondansetron (ZOFRAN) injection 4 mg, 4 mg, Intravenous, Q4H PRN, Tia Alert, MD;  ondansetron [facility]) tablet 4 mg, 4 mg, Oral, Q6H PRN, Huey Bienenstock, MD, 4 mg at 12/06/11 1131 oxyCODONE (Oxy IR/ROXICODONE) immediate release tablet 5 mg, 5 mg, Oral, Q4H PRN, Tia Alert, MD, 5 mg at 12/06/11 1131;  phenol (CHLORASEPTIC) mouth spray 1 spray, 1 spray, Mouth/Throat, PRN, Tia Alert, MD;   polyethylene glycol (MIRALAX / GLYCOLAX) packet 17 g, 17 g, Oral, Daily, Dorothea Ogle, MD, 17 g at 12/06/11 8469;  sodium bicarbonate tablet 1,300 mg, 1,300 mg, Oral, BID, Huey Bienenstock, MD, 1,300 mg at 12/06/11 6295 sodium chloride 0.9 % bolus 250 mL, 250 mL, Intravenous, Once, Huey Bienenstock, MD;  traMADol Janean Sark) tablet 50 mg, 50 mg, Oral, Q6H PRN, Rolan Lipa, NP, 50 mg at 12/06/11 2841  Patients Current Diet: General  Precautions / Restrictions Precautions Precautions: Fall Precaution Comments: Pt with hypersensitivity to touch (pain) left arm from elbow distally (better than it wa prior to ACDF, but still an issue) Cervical Brace: Soft collar Restrictions Weight Bearing Restrictions: No   Prior Activity  Level  Barista / Equipment Home Assistive Devices/Equipment: None Home Adaptive Equipment: Shower chair with back  Prior Functional Level Prior Function Level of Independence: Independent Able to Take Stairs?: Yes Driving: Yes Vocation: Retired Comments: Used to own the [employer]  Current Functional Level Cognition  Arousal/Alertness: Awake/alert Overall Cognitive Status: Appears within functional limits for tasks assessed/performed Orientation Level: Oriented X4    Extremity Assessment (includes Sensation/Coordination)  RUE ROM/Strength/Tone: Deficits RUE ROM/Strength/Tone Deficits: shoulder @ 3/5. elbow 3+/5 flexion, 3/5 extension. hand @ 3+/5 throughout. ataxic type movements RUE Sensation: Deficits RUE Sensation Deficits: per pt reports as abnormal. decreased lt touch RUE Coordination: Deficits RUE Coordination Deficits: ataxia. poor fine/gross motor. Poor in hand manipulation skills  RLE ROM/Strength/Tone: Deficits RLE ROM/Strength/Tone Deficits: Generalized weakness    ADLs  Eating/Feeding: Performed;Minimal assistance;Min guard;Other (comment) (using AE for feeding. built up tubing, sippy cup, plate  guar) Where Assessed - Eating/Feeding: Bed level Grooming: Performed;Brushing hair;Minimal assistance (with RUE) Where Assessed - Grooming: Unsupported sitting Upper Body Bathing: Simulated;Maximal assistance Where Assessed - Upper Body Bathing: Supine, head of bed up Lower Body Bathing: Simulated;+1 Total assistance Where Assessed - Lower Body Bathing: Supine, head of bed up;Lean right and/or left Upper Body Dressing: Simulated;Maximal assistance Where Assessed - Upper Body Dressing: Supine, head of bed up Lower Body Dressing: Simulated;+1 Total assistance Where Assessed - Lower Body Dressing: Supine, head of bed up;Rolling right and/or left Toilet Transfer: Simulated;Minimal assistance Toilet Transfer Method: Sit to stand Toilet Transfer Equipment:  (Bed to recliner about 3 feet away from each other) Toileting - Clothing Manipulation and Hygiene: Performed;Independent Where Assessed - Toileting Clothing Manipulation and Hygiene: Sit on 3-in-1 or toilet Equipment Used:  (soft c collar) Transfers/Ambulation Related to ADLs: Min A ADL Comments: Pt able to push soft touch call bell. Pt also able to turn on/off phone now with RUE, turn it over and put it up to her ear on the right.     Mobility  Bed Mobility: Not assessed Rolling Right: 4: Min assist;With rail Rolling Left: 3: Mod assist Right Sidelying to Sit: 1: +2 Total assist;HOB flat Right Sidelying to Sit: Patient Percentage: 60% Supine to Sit: 4: Min guard;HOB flat Sitting - Scoot to Edge of Bed: 5: Supervision Sit to Supine: 4: Min guard;HOB elevated    Transfers  Transfers: Sit to Stand;Stand to Sit Sit to Stand: 4: Min assist;With upper extremity assist;With armrests;From chair/3-in-1;From toilet Sit to Stand: Patient Percentage: 80% Stand to Sit: 4: Min assist;With upper extremity assist;With armrests;To chair/3-in-1;To toilet Stand to Sit: Patient Percentage: 90% Stand Pivot Transfers: Not tested (comment)     Ambulation / Gait / Stairs / Wheelchair Mobility  Ambulation/Gait Ambulation/Gait Assistance: 4: Min assist Ambulation/Gait: Patient Percentage: 90% Ambulation Distance (Feet): 180 Feet (then 20) Assistive device: Left platform walker Ambulation/Gait Assistance Details: Needs physical assist to maneuver platform RW at times, especially with turns.  LOB x 1 that required min assist to recover.  Fatiques quickly. Gait Pattern: Step-through pattern;Decreased stride length;Scissoring;Narrow base of support (R LE externally rotated) Gait velocity: decreased General Gait Details: scissoring worse today Stairs: No Wheelchair Mobility Wheelchair Mobility: No    Posture / Balance Static Sitting Balance Static Sitting - Balance Support: Bilateral upper extremity supported;Feet supported Static Sitting - Level of Assistance: 3: Mod assist (Up to mod assist due to posterior lean initially.) Static Sitting - Comment/# of Minutes: Pt sat EOB for 8 minutes total with up to mod assist due to inital posterior lean.  Pt able to progress to min assist with shifting weight anterior over BOS. Dynamic Sitting Balance Dynamic Sitting - Balance Support: No upper extremity supported;Feet supported Dynamic Sitting - Level of Assistance: 5: Stand by assistance Dynamic Sitting - Balance Activities: Lateral lean/weight shifting;Forward lean/weight shifting;Reaching for objects     Previous Home Environment Living Arrangements: Alone Lives With: Alone Available Help at Discharge: Family;Neighbor Type of Home: House Home Layout: One level Home Access: Stairs to enter Entrance Stairs-Rails: Left Entrance Stairs-Number of Steps: 2 Bathroom Shower/Tub: Tub/shower unit;Curtain Bathroom Toilet: Standard Bathroom Accessibility: Yes How Accessible: Accessible via walker Home Care Services: No  Discharge Living Setting Plans for Discharge Living Setting: Patient's home Do you have any problems obtaining your  medications?: No  Social/Family/Support Systems Patient Roles: Parent Contact Information: [phone] Anticipated Caregiver: daughters Anticipated Caregiver's Contact Information: Patton Salles cell [phone] Ability/Limitations of Caregiver: S-Min Assist Caregiver Availability: 24/7 Discharge Plan Discussed with Primary Caregiver: Yes Is Caregiver In Agreement with Plan?: Yes Does Caregiver/Family have Issues with Lodging/Transportation while Pt is in Rehab?: No  Goals/Additional Needs Patient/Family Goal for Rehab: S-Min Assist Expected length of stay: 10-14 days Additional Information: pain & fatigue are issues Pt/Family Agrees to Admission and willing to participate: Yes Program Orientation Provided & Reviewed with Pt/Caregiver Including Roles  & Responsibilities: Yes  Patient Condition: Please see physician update to information in consult dated 12/04/11.  Preadmission Screen Completed By:  Brock Ra, 12/06/2011 11:53 AM ______________________________________________________________________   Discussed status with Dr. Riley Kill on 12/06/11 at 11:53 and received telephone approval for admission today.  Admission Coordinator:  Brock Ra, time 11:53/Date 12/06/11

## 2011-12-06 NOTE — Progress Notes (Signed)
Nutrition Follow-up  Intervention:    Encourage PO intake at meals.  Continue Ensure Complete and Ensure pudding.  Assessment:   Pt s/p surgery (ANTERIOR CERVICAL DECOMPRESSION/DISCECTOMY FUSION 1 LEVEL) on 11/30/11. Pt still reports pain and weakness in upper extremeties s/p surgery and has since felt depressed. Anticipated d/c to rehab today.  Diet Order:  Heart healthy (0-50% PO intake documented) Supplement: Ensure Complete BID; Ensure pudding BID (pt usually consuming one of each per day)  Meds: Scheduled Meds:    . amLODipine  10 mg Oral QHS  . atorvastatin  20 mg Oral Daily  . docusate sodium  100 mg Oral BID  . DULoxetine  30 mg Oral Daily  . feeding supplement  237 mL Oral BID BM  . feeding supplement  1 Container Oral BID WC  . gabapentin  300 mg Oral TID  . insulin aspart  0-5 Units Subcutaneous QHS  . insulin aspart  0-9 Units Subcutaneous TID WC  . insulin glargine  15 Units Subcutaneous Daily  . multivitamin with minerals  1 tablet Oral Daily  . polyethylene glycol  17 g Oral Daily  . sodium bicarbonate  1,300 mg Oral BID  . sodium chloride  250 mL Intravenous Once   Continuous Infusions:    . 0.9 % NaCl with KCl 20 mEq / L 75 mL/hr (12/02/11 0600)   PRN Meds:.acetaminophen, acetaminophen, LORazepam, menthol-cetylpyridinium, methocarbamol, morphine injection, nitroGLYCERIN, ondansetron (ZOFRAN) IV, ondansetron, oxyCODONE, phenol, traMADol  Labs:  CMP     Component Value Date/Time   NA 134* 11/27/2011 0425   K 4.5 11/27/2011 0425   CL 94* 11/27/2011 0425   CO2 30 11/27/2011 0425   GLUCOSE 325* 11/27/2011 0425   BUN 24* 11/27/2011 0425   CREATININE 1.03 11/27/2011 0425   CALCIUM 10.5 11/27/2011 0425   PROT 8.3 11/17/2011 2230   ALBUMIN 4.6 11/17/2011 2230   AST 45* 11/17/2011 2230   ALT 21 11/17/2011 2230   ALKPHOS 88 11/17/2011 2230   BILITOT 0.5 11/17/2011 2230   GFRNONAA 50* 11/27/2011 0425   GFRAA 57* 11/27/2011 0425   CBG (last 3)   Basename 12/06/11 0648  12/05/11 2116 12/05/11 1650  GLUCAP 96 155* 221*    Lab Results  Component Value Date   HGBA1C 7.5* 11/29/2011   HGBA1C  Value: 6.6 (NOTE)                                                                       According to the ADA Clinical Practice Recommendations for 2011, when HbA1c is used as a screening test:   >=6.5%   Diagnostic of Diabetes Mellitus           (if abnormal result  is confirmed)  5.7-6.4%   Increased risk of developing Diabetes Mellitus  References:Diagnosis and Classification of Diabetes Mellitus,Diabetes Care,2011,34(Suppl 1):S62-S69 and Standards of Medical Care in         Diabetes - 2011,Diabetes Care,2011,34  (Suppl 1):S11-S61.* 09/05/2009   HGBA1C  Value: 7.2 (NOTE) The ADA recommends the following therapeutic goal for glycemic control related to Hgb A1c measurement: Goal of therapy: <6.5 Hgb A1c  Reference: American Diabetes Association: Clinical Practice Recommendations 2010, Diabetes Care, 2010, 33: (Suppl  1).* 07/26/2008   Lab  Results  Component Value Date   LDLCALC  Value: 69        Total Cholesterol/HDL:CHD Risk Coronary Heart Disease Risk Table                     Men   Women  1/2 Average Risk   3.4   3.3  Average Risk       5.0   4.4  2 X Average Risk   9.6   7.1  3 X Average Risk  23.4   11.0        Use the calculated Patient Ratio above and the CHD Risk Table to determine the patient's CHD Risk.        ATP III CLASSIFICATION (LDL):  <100     mg/dL   Optimal  914-782  mg/dL   Near or Above                    Optimal  130-159  mg/dL   Borderline  956-213  mg/dL   High  >086     mg/dL   Very High 5/78/4696   CREATININE 1.03 11/27/2011    Weight Status:  No new weight Wt Readings from Last 10 Encounters:  11/29/11 141 lb 12.1 oz (64.3 kg)  11/29/11 141 lb 12.1 oz (64.3 kg)  07/12/11 146 lb (66.225 kg)  11/30/10 155 lb 12.8 oz (70.67 kg)  05/26/10 156 lb 12 oz (71.101 kg)  01/06/10 153 lb (69.4 kg)  12/16/09 152 lb (68.947 kg)  11/02/09 154 lb (69.854 kg)    03/15/09 151 lb (68.493 kg)  09/17/08 157 lb (71.215 kg)   Estimated needs:   1480-1680 kcal, 64-74 gm protein  Nutrition Dx:  Inadequate oral intake, ongoing.  Goal:  Pt to meet >/= 90% of their estimated nutrition needs. Not met.  Monitor:  weight trends, lab trends, I/O's, PO intake, supplement tolerance  [name], Trinda Pascal RD, LDN

## 2011-12-07 ENCOUNTER — Inpatient Hospital Stay (HOSPITAL_COMMUNITY): Payer: Medicare Other | Admitting: Occupational Therapy

## 2011-12-07 ENCOUNTER — Inpatient Hospital Stay (HOSPITAL_COMMUNITY): Payer: Medicare Other

## 2011-12-07 ENCOUNTER — Inpatient Hospital Stay (HOSPITAL_COMMUNITY): Payer: Medicare Other | Admitting: *Deleted

## 2011-12-07 DIAGNOSIS — G959 Disease of spinal cord, unspecified: Secondary | ICD-10-CM

## 2011-12-07 LAB — GLUCOSE, CAPILLARY
Glucose-Capillary: 62 mg/dL — ABNORMAL LOW (ref 70–99)
Glucose-Capillary: 90 mg/dL (ref 70–99)

## 2011-12-07 LAB — CBC WITH DIFFERENTIAL/PLATELET
Basophils Absolute: 0 10*3/uL (ref 0.0–0.1)
Basophils Relative: 0 % (ref 0–1)
Eosinophils Relative: 2 % (ref 0–5)
HCT: 29 % — ABNORMAL LOW (ref 36.0–46.0)
MCHC: 32.8 g/dL (ref 30.0–36.0)
MCV: 92.4 fL (ref 78.0–100.0)
Monocytes Absolute: 0.5 10*3/uL (ref 0.1–1.0)
RDW: 13.2 % (ref 11.5–15.5)

## 2011-12-07 LAB — COMPREHENSIVE METABOLIC PANEL
Alkaline Phosphatase: 58 U/L (ref 39–117)
BUN: 15 mg/dL (ref 6–23)
Chloride: 98 mEq/L (ref 96–112)
GFR calc Af Amer: 56 mL/min — ABNORMAL LOW (ref >90)
Glucose, Bld: 75 mg/dL (ref 70–99)
Potassium: 4.5 mEq/L (ref 3.5–5.1)
Total Bilirubin: 0.4 mg/dL (ref 0.3–1.2)

## 2011-12-07 MED ORDER — DICLOFENAC SODIUM 1 % TD GEL
1.0000 "application " | Freq: Four times a day (QID) | TRANSDERMAL | Status: DC
  Administered 2011-12-07 – 2011-12-14 (×25): 1 via TOPICAL
  Filled 2011-12-07: qty 100

## 2011-12-07 NOTE — Significant Event (Signed)
Hypoglycemic Event  CBG: 60  Treatment: 15 GM carbohydrate snack  Symptoms: Vision changes  Follow-up CBG: Time:1215 CBG Result:100  Possible Reasons for Event: Inadequate meal intake  Comments/MD notified:Yes    [name]  Remember to initiate Hypoglycemia Order Set & complete

## 2011-12-07 NOTE — Progress Notes (Signed)
Pt was in therapy and felt light headed and dizzy. Vision was slightly blurry. Checked VS BP-117/52 HR-67 and Blood sugar was 78. Pt back to bed and states she feels better. Notified PA, Dan Anguilli. Cont. To monitor pt.

## 2011-12-07 NOTE — Progress Notes (Signed)
Physical Therapy Session Note  Patient Details  Name: [name] MRN: [id number] Date of Birth: [birth year]  Today's Date: 12/07/2011 Time: 1308-6578 Time Calculation (min): 45 min  Short Term Goals: Week 1:  PT Short Term Goal 1 (Week 1): = LTGs  Skilled Therapeutic Interventions/Progress Updates:    family education with daughters on safe Rw use, transfers, car transfer and they observed steps. Once pt on to of 3 steps she stated she felt she was going to pass out, assisted into chair, nurse came, BP 117/57, HR 67 and blood sugar 78 (had been 60 earlier) pt with some confusion, assited back to bed and left with nurse  Pt climbed 3 steps with L rail and HHA on R min A with max cues for technique d/t reports of bursitis and intermittent pain in L knee  Stand pivot car transfer min A and regular transfers min A with constant cues for hand placement and to keep RW with her  Therapy Documentation Precautions:  Precautions Precautions: Fall Required Braces or Orthoses: Cervical Brace Cervical Brace: Soft collar Restrictions Weight Bearing Restrictions: No    Vital Signs: Therapy Vitals Pulse Rate: 67  BP: 117/52 mmHg (was in therapy and felt light headed. PA, Angiulli notified) Patient Position, if appropriate: Sitting Pain:c/o significant fatigue but not pain   Locomotion : Stairs / Additional Locomotion Stairs: Yes Stairs Assistance: 4: Min assist Stairs Assistance Details: Verbal cues for technique Stairs Assistance Details (indicate cue type and reason): step to Stair Management Technique: One rail Left;Step to pattern (HHA) Number of Stairs: 3      Other Treatments: Treatments Therapeutic Activity: car transfer training to regular and SUV type with running board which is unsafe, family return demonstrated A pt with regular car transfer stand pivot with RW min A and cues  See FIM for current functional status  Therapy/Group: Individual Therapy  Michaelene Song 12/07/2011, 2:01 PM

## 2011-12-07 NOTE — Progress Notes (Signed)
Occupational Therapy Session Note  Patient Details  Name: [name] MRN: [id number] Date of Birth: 04-17-1928  Today's Date: 12/07/2011 Time: 1000-1030 Time Calculation (min): 30 min  Short Term Goals: Week 1:  OT Short Term Goal 1 (Week 1): Patient will perform UB/LB bathing with supervision OT Short Term Goal 2 (Week 1): Patient will perform UB/LB dressing with supervision OT Short Term Goal 3 (Week 1): Patient will perform toilet transfer with supervision OT Short Term Goal 4 (Week 1): Patient will perform tub/shower transfer with supervision  Skilled Therapeutic Interventions/Progress Updates:  Pt seen for functional mobility skills training with ADL transfers. Pt stated she was feeling much better from earlier in the morning. Pt ambulated from her recliner to toilet with RW with min to steady assist, then into walk in shower to sit on tub bench, she then ambulated to sink to wash hands.  Pt did not need assist to move from sit to supine in bed.  Pt stated that she has a tub/ shower combination.  Therapy Documentation Precautions:  Precautions Precautions: Fall Required Braces or Orthoses: Cervical Brace Cervical Brace: Soft collar Restrictions Weight Bearing Restrictions: No  Pain: Pain Assessment Pain Assessment: No/denies pain  ADL:  See FIM for current functional status  Therapy/Group: Individual Therapy  Atul Delucia 12/07/2011, 11:54 AM

## 2011-12-07 NOTE — Progress Notes (Signed)
Social Work Assessment and Plan Social Work Assessment and Plan  Patient Details  Name: [name] MRN: [id number] Date of Birth: 01-Aug-1928  Today's Date: 12/07/2011  Problem List:  Patient Active Problem List  Diagnosis  . Shortness of breath  . CAD (coronary artery disease)  . Hx of CABG  . Ejection fraction  . Hypertension  . Palpitations  . Dyslipidemia  . DM (diabetes mellitus)  . Carotid artery disease  . Dizziness  . Cervical spine disease  . Hyperkalemia  . Chest pain  . UTI (lower urinary tract infection)  . Altered mental status  . Fall  . Upper extremity weakness  . Pain of upper extremity  . Fever  . Depression  . Myelopathy   Past Medical History:  Past Medical History  Diagnosis Date  . Shortness of breath   . CAD (coronary artery disease)     Last catheterization 2009, grafts patent, EF 70%, 80% small OM with possible mild ischemic  . Hx of CABG     2000, LIMA to LAD, SVG to diagonal, SVG to OM, SVG to posterior descending  . Ejection fraction     EF 70%,, 2009 /  EF 65%, echo, October, 2011  . Hypertension   . Palpitations   . Dyslipidemia   . Diabetes mellitus   . Pancreatitis   . Carotid artery disease     Mild, Doppler, 2000  . Dizziness     Evaluated April, 2008, no treatment needed  . Cervical spine disease     Severe neck pain, 2011  . Hyperkalemia     Her doctor Zachery Dauer, September, 2012, ACE inhibitor stopped, probable amlodipine to be  . Shortness of breath   . History of hysterectomy   . Chest pain     Chest hurting, May, 2013   Past Surgical History:  Past Surgical History  Procedure Date  . Coronary artery bypass graft 2000  . Cardiac catheterization 2009  . Cholecystectomy   . Anterior cervical decomp/discectomy fusion 11/30/2011    Procedure: ANTERIOR CERVICAL DECOMPRESSION/DISCECTOMY FUSION 1 LEVEL;  Surgeon: Tia Alert, MD;  Location: [facility];  Service: Neurosurgery;  Laterality: N/A;  Anterior Cervical  Decompression Discectomy Cervical Five-Six   Social History:  reports that she has never smoked. She does not have any smokeless tobacco history on file. She reports that she does not drink alcohol or use illicit drugs.  Family / Support Systems Marital Status: Divorced Patient Roles: Parent Children: Synetta Fail Jones-[phone]-home  [phone] Other Supports: other daughters and neighbor Anticipated Caregiver: Daughter's Ability/Limitations of Caregiver: Supervision/min level Caregiver Availability: 24/7 Family Dynamics: Pt has four daughter's who are involved and supportive.  She also has a neighbor who is involved in her care.  Social History Preferred language: English Religion:  Cultural Background: No issues Education: High School Read: Yes Write: Yes Employment Status: Retired Fish farm manager Issues: No issues Guardian/Conservator: None-According to MD pt is capable of making her own decisions   Abuse/Neglect Physical Abuse: Denies Verbal Abuse: Denies Sexual Abuse: Denies Exploitation of patient/patient's resources: Denies Self-Neglect: Denies  Emotional Status Pt's affect, behavior adn adjustment status: Pt is motivated to imporve, but having pain.  She still is puzzled about how she fell and did this to herself.  Shehas not been falling at home. Recent Psychosocial Issues: Other medical issues Pyschiatric History: No history-depression screen score-2.  She feels she will do well once feels better and pain lessened. Substance Abuse History: No issues  Patient /  Family Perceptions, Expectations & Goals Pt/Family understanding of illness & functional limitations: Pt is able to explain her condition and issues.  She is still getting used to the brace and she is sore and has pain still.  She realizes it will take time to recover and heal. Premorbid pt/family roles/activities: Mother, Grandmother, Retiree, Home owner, etc Anticipated changes in  roles/activities/participation: Resume Pt/family expectations/goals: Pt states: " I want to do for myself, like I always have."  " My daughter's will be there, maybe too much."  [employer]: None Premorbid Home Care/DME Agencies: None Transportation available at discharge: Family  Discharge Planning Living Arrangements: Alone Support Systems: Children;Friends/neighbors;Church/faith community Type of Residence: Private residence Insurance Resources: Media planner (specify);Medicaid (specify county) (UHC-Medicare and [place]) Financial Resources: Restaurant manager, fast food Screen Referred: No Living Expenses: Own Money Management: Patient Do you have any problems obtaining your medications?: No Home Management: Pateint and daughter's Patient/Family Preliminary Plans: Return home with daughter's and neighbor assisting.  She reports someone will always be there with her at discharge. Social Work Anticipated Follow Up Needs: HH/OP  Clinical Impression Pleasant female in pain from neck and collar adjusting.  Motivated to improve and regain her independence.  Supportive family, should do well here.  Lucy Chris 12/07/2011, 10:16 AM

## 2011-12-07 NOTE — Significant Event (Signed)
Hypoglycemic Event  CBG: 62  Treatment: 15 GM carbohydrate snack  Symptoms: None  Follow-up CBG: Time:2218 CBG Result:90  Possible Reasons for Event: Unknown  Comments/MD notified: At 2220 notified Pam Love,PA no new orders given at this time.    Bonnee Quin  Remember to initiate Hypoglycemia Order Set & complete

## 2011-12-07 NOTE — Progress Notes (Signed)
Subjective/Complaints: Complains of neck and arm pain over night into this am. Right knee sore. No other problems. Nausea better A 12 point review of systems has been performed and if not noted above is otherwise negative.   Objective: Vital Signs: Blood pressure 109/59, pulse 70, temperature 98.7 F (37.1 C), temperature source Oral, resp. rate 18, height 5\' 7"  (1.702 m), SpO2 96.00%. Dg Knee Complete 4 Views Right  12/05/2011  *RADIOLOGY REPORT*  Clinical Data: Knee pain, fall.  RIGHT KNEE - COMPLETE 4+ VIEW  Comparison: None.  Findings: Moderate degenerative changes with joint space narrowing and mild spurring. No acute bony abnormality.  Specifically, no fracture, subluxation, or dislocation.  Soft tissues are intact. Small joint effusion suspected.  IMPRESSION: Mild degenerative changes and small joint effusion.  No acute bony abnormality.   Original Report Authenticated By: Cyndie Chime, M.D.    No results found for this basename: WBC:2,HGB:2,HCT:2,PLT:2 in the last 72 hours  Basename 12/07/11 0545  NA 134*  K 4.5  CL 98  CO2 25  GLUCOSE 75  BUN 15  CREATININE 1.05  CALCIUM 9.5   CBG (last 3)   Basename 12/07/11 0713 12/06/11 2056 12/06/11 1647  GLUCAP 81 99 120*    Wt Readings from Last 3 Encounters:  11/29/11 64.3 kg (141 lb 12.1 oz)  11/29/11 64.3 kg (141 lb 12.1 oz)  07/12/11 66.225 kg (146 lb)    Physical Exam:  HENT:  Head: Normocephalic.  Eyes:  Pupils round and reactive to light  Neck:  Soft cervical collar in place  Cardiovascular: Normal rate and regular rhythm.  Pulmonary/Chest: Breath sounds normal. She has no wheezes.  Abdominal: Bowel sounds are normal. She exhibits no distension. There is no tenderness.  Musculoskeletal: She exhibits no edema. Tender around surgical site and bilateral traps. Right knee tender at the patella, mild swelling. No bruise Neurological: She is alert.  Patient is oriented to name, year and place. He had some difficulty  recalling full of dense related to her fall. She follows two-step commands  Psychiatric: She has a flat affect.  Ataxia FNF in BUE  Motor 3/5 in B Delt, 3+ bi,tri, grip 3,  3-4 B HF,KE,ADF with some pain noted at right knee especially. Sensory mildly reduced Lt touch in hands left more than right. Minimal sensory loss in the legs   Assessment/Plan: 1. Functional deficits secondary to cervical myelopathy/central cord C5-6 which require 3+ hours per day of interdisciplinary therapy in a comprehensive inpatient rehab setting. Physiatrist is providing close team supervision and 24 hour management of active medical problems listed below. Physiatrist and rehab team continue to assess barriers to discharge/monitor patient progress toward functional and medical goals. FIM: FIM - Bathing Bathing: 0: Activity did not occur  FIM - Upper Body Dressing/Undressing Upper body dressing/undressing steps patient completed: Thread/unthread right sleeve of front closure shirt/dress;Thread/unthread left sleeve of front closure shirt/dress;Pull shirt around back of front closure shirt/dress;Button/unbutton shirt Upper body dressing/undressing: 4: Steadying assist FIM - Lower Body Dressing/Undressing Lower body dressing/undressing steps patient completed: Thread/unthread right pants leg;Thread/unthread left pants leg;Don/Doff right sock;Don/Doff left sock Lower body dressing/undressing: 4: Min-Patient completed 75 plus % of tasks  FIM - Toileting Toileting steps completed by patient: Adjust clothing prior to toileting;Performs perineal hygiene;Adjust clothing after toileting Toileting: 0: Activity did not occur  FIM - Diplomatic Services operational officer Devices: Bedside commode Toilet Transfers: 0-Activity did not occur  FIM - Games developer Transfer: 4: Supine > Sit: Min A (steadying  Pt. > 75%/lift 1 leg);4: Bed > Chair or W/C: Min A (steadying Pt. > 75%)      Comprehension Comprehension Mode: Auditory Comprehension: 6-Follows complex conversation/direction: With extra time/assistive device  Expression Expression Mode: Verbal Expression: 6-Expresses complex ideas: With extra time/assistive device  Social Interaction Social Interaction: 6-Interacts appropriately with others with medication or extra time (anti-anxiety, antidepressant).  Problem Solving Problem Solving: 6-Solves complex problems: With extra time  Memory Memory: 6-More than reasonable amt of time  Medical Problem List and Plan:  1. Cervical myelopathy status post ACDF 11/30/2011  2. DVT Prophylaxis/Anticoagulation: SCDs. Monitor for any signs of DVT  3. Pain Management: Neurontin 300 mg 3 times a day, Robaxin and oxycodone as needed. Monitor with increased mobility  -Right knee may be slightly contused, or this may be a prepatellar bursitis  -utilize ice, voltaren gel for knee  4. Mood: Cymbalta 30 mg daily, Ativan 0.5 mg every 8 hours as needed. Provide emotional support and positive reinforcement  5. Neuropsych: This patient is capable of making decisions on his/her own behalf.  6. Coronary artery disease a coronary artery bypass grafting. Aspirin 81 mg daily resumed. Resumed Plavix 75 mg daily in 2 weeks  7. diabetes mellitus. Hemoglobin A1c 7.5.Glyburide 5 mg twice a day, Glucophage 500 mg daily, Januvia 100 mg daily.   8. Hypertension. Norvasc 10 mg daily. Monitor with increased mobility  9. Hyperlipidemia. Lipitor  LOS (Days) 1 A FACE TO FACE EVALUATION WAS PERFORMED  Aften Lipsey T 12/07/2011, 8:48 AM

## 2011-12-07 NOTE — Care Management Note (Signed)
[facility] Individual Statement of Services  Patient Name:  [name]  Date:  12/07/2011  Welcome to the [facility].  Our goal is to provide you with an individualized program based on your diagnosis and situation, designed to meet your specific needs.  With this comprehensive rehabilitation program, you will be expected to participate in at least 3 hours of rehabilitation therapies Monday-Friday, with modified therapy programming on the weekends.  Your rehabilitation program will include the following services:  Physical Therapy (PT), Occupational Therapy (OT), Speech Therapy (ST), 24 hour per day rehabilitation nursing, Case Management (RN and Social Worker), Rehabilitation Medicine, Nutrition Services and Pharmacy Services  Weekly team conferences will be held on Tuesday to discuss your progress.  Your RN Case Designer, television/film set will talk with you frequently to get your input and to update you on team discussions.  Team conferences with you and your family in attendance may also be held.  Expected length of stay: 7-10 days Overall anticipated outcome: Supervision/mod/i level  Depending on your progress and recovery, your program may change.  Your RN Case Estate agent will coordinate services and will keep you informed of any changes.  Your RN Sports coach and SW names and contact numbers are listed  below.  The following services may also be recommended but are not provided by the [facility]:   Driving Evaluations  Home Health Rehabiltiation Services  Outpatient Rehabilitatation [facility]  Vocational Rehabilitation   Arrangements will be made to provide these services after discharge if needed.  Arrangements include referral to agencies that provide these services.  Your insurance has been verified to be:  UHC-Medicare & Medicaid Your primary doctor is:  Dr Juluis Rainier  Pertinent information  will be shared with your doctor and your insurance company.   Social Worker:  Dossie Der, [place] [phone]  Information discussed with and copy given to patient by: Lucy Chris, 12/07/2011, 8:55 AM

## 2011-12-07 NOTE — Evaluation (Signed)
Physical Therapy Assessment and Plan  Patient Details  Name: [name] MRN: [id number] Date of Birth: 1928-03-25  PT Diagnosis: Abnormality of gait, Difficulty walking, Impaired sensation, Muscle weakness and Pain in joint Rehab Potential: Good ELOS: 7-10 days   Today's Date: 12/07/2011 Time: 830-928 (58 minutes)  Problem List:  Patient Active Problem List  Diagnosis  . Shortness of breath  . CAD (coronary artery disease)  . Hx of CABG  . Ejection fraction  . Hypertension  . Palpitations  . Dyslipidemia  . DM (diabetes mellitus)  . Carotid artery disease  . Dizziness  . Cervical spine disease  . Hyperkalemia  . Chest pain  . UTI (lower urinary tract infection)  . Altered mental status  . Fall  . Upper extremity weakness  . Pain of upper extremity  . Fever  . Depression  . Myelopathy    Past Medical History:  Past Medical History  Diagnosis Date  . Shortness of breath   . CAD (coronary artery disease)     Last catheterization 2009, grafts patent, EF 70%, 80% small OM with possible mild ischemic  . Hx of CABG     2000, LIMA to LAD, SVG to diagonal, SVG to OM, SVG to posterior descending  . Ejection fraction     EF 70%,, 2009 /  EF 65%, echo, October, 2011  . Hypertension   . Palpitations   . Dyslipidemia   . Diabetes mellitus   . Pancreatitis   . Carotid artery disease     Mild, Doppler, 2000  . Dizziness     Evaluated April, 2008, no treatment needed  . Cervical spine disease     Severe neck pain, 2011  . Hyperkalemia     Her doctor Zachery Dauer, September, 2012, ACE inhibitor stopped, probable amlodipine to be  . Shortness of breath   . History of hysterectomy   . Chest pain     Chest hurting, May, 2013   Past Surgical History:  Past Surgical History  Procedure Date  . Coronary artery bypass graft 2000  . Cardiac catheterization 2009  . Cholecystectomy   . Anterior cervical decomp/discectomy fusion 11/30/2011    Procedure: ANTERIOR CERVICAL  DECOMPRESSION/DISCECTOMY FUSION 1 LEVEL;  Surgeon: Tia Alert, MD;  Location: [facility];  Service: Neurosurgery;  Laterality: N/A;  Anterior Cervical Decompression Discectomy Cervical Five-Six    Assessment & Plan Clinical Impression: Patient is a 78 y.o. year old female with history of coronary artery disease, CABG, diabetes mellitus with peripheral neuropathy. Admitted 11/18/2011 after reported fall that was unwitnessed. Patient was a poor historian. Patient with upper extremity weakness noted after fall. Cranial CT scan was negative for acute intracranial abnormalities. Noted creatinine upon admission of 1.48. Carotid Dopplers completed workup of questionable syncopal episode showing no ICA stenosis. MRI cervical spine revealed acute injury to C5-6 with stenosis, signal change in the posterior ligamentous structure, and mild signal change in the cord. Followup neurosurgery plan for ACDF. Surgery delayed secondary to patient being on aspirin Plavix which was held. Underwent decompressive anterior cervical discectomy C5-6 11/30/2011 per Dr. Marikay Alar. Advised cervical collar for comfort. Patient transferred to CIR on 12/06/2011 .   Patient currently requires mod with mobility secondary to muscle weakness, decreased coordination and decreased standing balance, decreased postural control and decreased balance strategies.  Prior to hospitalization, patient was independent with mobility and lived with Alone in a House home.  Home access is 2Stairs to enter.  Patient will benefit from skilled PT  intervention to maximize safe functional mobility, minimize fall risk and decrease caregiver burden for planned discharge home with intermittent assist.  Anticipate patient will benefit from follow up [facility] at discharge.  PT - End of Session Endurance Deficit: Yes PT Assessment Rehab Potential: Good Barriers to Discharge: Decreased caregiver support PT Plan PT Frequency: 1-2 X/day, 60-90 minutes Estimated  Length of Stay: 7-10 days PT Treatment/Interventions: Ambulation/gait training;Balance/vestibular training;Community reintegration;Discharge planning;DME/adaptive equipment instruction;Functional mobility training;Neuromuscular re-education;Pain management;Patient/family education;Psychosocial support;Skin care/wound management;Splinting/orthotics;Stair training;Therapeutic Activities;Therapeutic Exercise;UE/LE Strength taining/ROM;UE/LE Coordination activities;Wheelchair propulsion/positioning PT Recommendation Follow Up Recommendations: Home health PT;24 hour supervision/assistance Equipment Recommended: Rolling walker with 5" wheels  PT Evaluation Precautions/Restrictions Precautions Precautions: Fall Required Braces or Orthoses: Cervical Brace Cervical Brace: Soft collar Restrictions Weight Bearing Restrictions: No Pain C/o pain in R knee - ice applied end of session. Premedicated prior to session Home Living/Prior Functioning Home Living Lives With: Alone Available Help at Discharge: Family;Neighbor Type of Home: House Home Access: Stairs to enter [employer] of Steps: 2 Entrance Stairs-Rails: Can reach both Home Layout: One level Bathroom Shower/Tub: Forensic scientist: Standard Bathroom Accessibility: Yes How Accessible: Accessible via walker Home Adaptive Equipment: Shower chair with back Prior Function Level of Independence: Independent with basic ADLs;Independent with homemaking with ambulation;Independent with gait;Independent with transfers Able to Take Stairs?: Yes Driving: Yes Vocation: Retired Optometrist - History Baseline Vision: Wears glasses only for reading Patient Visual Report: No change from baseline Vision - Assessment Eye Alignment: Within Functional Limits Perception Perception: Within Functional Limits Praxis Praxis: Intact  Cognition Overall Cognitive Status: Appears within functional limits for  tasks assessed Arousal/Alertness: Awake/alert Orientation Level: Oriented X4 Memory: Appears intact Awareness: Appears intact Problem Solving: Appears intact Safety/Judgment: Appears intact Comments: appears intact during conversation on eval Sensation Sensation Light Touch: Impaired by gross assessment Stereognosis: Impaired by gross assessment Hot/Cold: Not tested Proprioception: Not tested Additional Comments: LE's appear intact to light tough, hypersensitivty mostly in LUE Coordination Gross Motor Movements are Fluid and Coordinated: No Fine Motor Movements are Fluid and Coordinated: Yes (slow movements bilaterally) Motor  Motor Motor: Other (comment);Ataxia ((gait)) scissoring noted with gait.    Trunk/Postural Assessment  Cervical Assessment Cervical Assessment: Exceptions to [facility] (cervical collar) Thoracic Assessment Thoracic Assessment: Exceptions to [facility] (kyphotic posture) Lumbar Assessment Lumbar Assessment: Within Functional Limits Postural Control Postural Control: Deficits on evaluation  Balance Balance Balance Assessed: Yes Static Sitting Balance Static Sitting - Level of Assistance: 5: Stand by assistance Dynamic Sitting Balance Dynamic Sitting - Level of Assistance: 5: Stand by assistance Static Standing Balance Static Standing - Level of Assistance: 4: Min assist Dynamic Standing Balance Dynamic Standing - Level of Assistance: 3: Mod assist Extremity Assessment  RUE Assessment RUE Assessment: Within Functional Limits (range WFL, can benefit from strengthening, hypersensitive) LUE Assessment LUE Assessment: Within Functional Limits (range WFL, can benefit from strengthening, hypersensitive) RLE Assessment RLE Assessment: Exceptions to [facility] (grossly 4/5 knee and ankle, 3+/5 hip; pain in R knee limitin) LLE Assessment LLE Assessment: Exceptions to [facility] (4/5 grossly knee, ankle; 3+/5 hip)  See FIM for current functional status Refer to Care Plan for Long  Term Goals  Recommendations for other services: None  Discharge Criteria: Patient will be discharged from PT if patient refuses treatment 3 consecutive times without medical reason, if treatment goals not met, if there is a change in medical status, if patient makes no progress towards goals or if patient is discharged from hospital.  The above assessment, treatment plan, treatment alternatives and  goals were discussed and mutually agreed upon: by patient  Individual therapy initiated with focus on transfers with and without AD, gait (scissoring and uncoordinated movement noted, posterior lean when LOB), toileting, and stair negotiation for home entry. Pt appears to possibly have some short term memory deficits, slow to process - possibility medication related? Pt had been using L PFRW before coming to CIR, but in future session may try without platform to see if pt able to manage better. Initial gait trials were with HHA (min/mod A). Pain in R knee limiting mobility as well, did not notice knee to buckle but pt very focused on it. Ice applied at end of session.  Karolee Stamps [facility] 12/07/2011, 9:37 AM

## 2011-12-07 NOTE — Evaluation (Signed)
Occupational Therapy Assessment and Plan & Session Note  Patient Details  Name: [name] MRN: [id number] Date of Birth: 1928/12/17  OT Diagnosis: acute pain and muscle weakness (generalized) Rehab Potential: Rehab Potential: Good ELOS: ~2weeks   Today's Date: 12/07/2011  Problem List:  Patient Active Problem List  Diagnosis  . Shortness of breath  . CAD (coronary artery disease)  . Hx of CABG  . Ejection fraction  . Hypertension  . Palpitations  . Dyslipidemia  . DM (diabetes mellitus)  . Carotid artery disease  . Dizziness  . Cervical spine disease  . Hyperkalemia  . Chest pain  . UTI (lower urinary tract infection)  . Altered mental status  . Fall  . Upper extremity weakness  . Pain of upper extremity  . Fever  . Depression  . Myelopathy    Past Medical History:  Past Medical History  Diagnosis Date  . Shortness of breath   . CAD (coronary artery disease)     Last catheterization 2009, grafts patent, EF 70%, 80% small OM with possible mild ischemic  . Hx of CABG     2000, LIMA to LAD, SVG to diagonal, SVG to OM, SVG to posterior descending  . Ejection fraction     EF 70%,, 2009 /  EF 65%, echo, October, 2011  . Hypertension   . Palpitations   . Dyslipidemia   . Diabetes mellitus   . Pancreatitis   . Carotid artery disease     Mild, Doppler, 2000  . Dizziness     Evaluated April, 2008, no treatment needed  . Cervical spine disease     Severe neck pain, 2011  . Hyperkalemia     Her doctor Zachery Dauer, September, 2012, ACE inhibitor stopped, probable amlodipine to be  . Shortness of breath   . History of hysterectomy   . Chest pain     Chest hurting, May, 2013   Past Surgical History:  Past Surgical History  Procedure Date  . Coronary artery bypass graft 2000  . Cardiac catheterization 2009  . Cholecystectomy   . Anterior cervical decomp/discectomy fusion 11/30/2011    Procedure: ANTERIOR CERVICAL DECOMPRESSION/DISCECTOMY FUSION 1 LEVEL;  Surgeon:  Tia Alert, MD;  Location: [facility];  Service: Neurosurgery;  Laterality: N/A;  Anterior Cervical Decompression Discectomy Cervical Five-Six    ASSESSMENT AND PLAN  Clinical Impression: [name] is a 78 y.o. right-handed female with history of coronary artery disease, CABG, diabetes mellitus with peripheral neuropathy. Admitted 11/18/2011 after reported fall that was unwitnessed. Patient was a poor historian. Patient with upper extremity weakness noted after fall. Cranial CT scan was negative for acute intracranial abnormalities. Noted creatinine upon admission of 1.48. Carotid Dopplers completed workup of questionable syncopal episode showing no ICA stenosis. MRI cervical spine revealed acute injury to C5-6 with stenosis, signal change in the posterior ligamentous structure, and mild signal change in the cord. Followup neurosurgery plan for ACDF. Surgery delayed secondary to patient being on aspirin Plavix which was held. Underwent decompressive anterior cervical discectomy C5-6 11/30/2011 per Dr. Marikay Alar. Advised cervical collar for comfort. Postoperative pain control. Psychiatry consulted 12/04/2011 for mood stabilization with bouts of anxiety. There is questioned as Celexa and monitor as patient is presently on Cymbalta. Physical and occupational therapy ongoing with recommendations for physical medicine rehabilitation consult to consider inpatient rehabilitation services. Patient was felt to be a good candidate for inpatient rehabilitation services and was admitted for comprehensive rehabilitation program. Patient transferred to CIR on 12/06/2011 .  Patient currently requires min with basic self-care skills and IADL secondary to muscle weakness and decreased standing balance, decreased postural control and decreased balance strategies.  Prior to hospitalization, patient could complete ADLs and IADLs independently.   Patient will benefit from skilled intervention to increase  independence with basic self-care skills prior to discharge home independently.  Anticipate patient will require intermittent supervision and additional occupational therapy is TBD.  OT - End of Session Activity Tolerance: Tolerates 10 - 20 min activity with multiple rests Endurance Deficit: Yes OT Assessment Rehab Potential: Good Barriers to Discharge: Decreased caregiver support OT Plan OT Frequency: 1-2 X/day, 60-90 minutes Estimated Length of Stay: ~2weeks OT Treatment/Interventions: Balance/vestibular training;Community reintegration;Discharge planning;Functional mobility training;Neuromuscular re-education;Pain management;DME/adaptive equipment instruction;Patient/family education;Psychosocial support;Self Care/advanced ADL retraining;Skin care/wound managment;Splinting/orthotics;Therapeutic Activities;Therapeutic Exercise;UE/LE Strength taining/ROM;UE/LE Coordination activities;Visual/perceptual remediation/compensation;Wheelchair propulsion/positioning OT Recommendation Follow Up Recommendations:  (OT: TBD) Equipment Recommended: Rolling walker with 5" wheels  Precautions/Restrictions  Precautions Precautions: Fall Required Braces or Orthoses: Cervical Brace Cervical Brace: Soft collar Restrictions Weight Bearing Restrictions: No  General Chart Reviewed: Yes Family/Caregiver Present: No  Pain Pain Assessment Pain Assessment: 0-10 Pain Score:   8 Pain Type: Acute pain Pain Location: Neck Pain Orientation: Right;Left Pain Radiating Towards: arms Pain Descriptors: Aching Pain Frequency: Constant Pain Onset: On-going Patients Stated Pain Goal: 3 Pain Intervention(s): Medication (See eMAR)  Home Living/Prior Functioning Home Living Lives With: Alone Available Help at Discharge: Family;Neighbor Type of Home: House Home Access: Stairs to enter [employer] of Steps: 2 Entrance Stairs-Rails: Can reach both Home Layout: One level Bathroom Shower/Tub:  Forensic scientist: Standard Bathroom Accessibility: Yes How Accessible: Accessible via walker Home Adaptive Equipment: Shower chair with back IADL History Homemaking Responsibilities: Yes Meal Prep Responsibility: Primary Laundry Responsibility: Primary Cleaning Responsibility: Primary Bill Paying/Finance Responsibility: Primary Shopping Responsibility: Primary Current License: Yes Occupation: Retired Type of Occupation: Public librarian Prior Function Level of Independence: Independent with basic ADLs;Independent with homemaking with ambulation;Independent with gait;Independent with transfers Able to Take Stairs?: Yes Driving: Yes Vocation: Retired  ADL - See FIM  Vision/Perception  Vision - History Baseline Vision: Wears glasses only for reading Patient Visual Report: No change from baseline Vision - Assessment Eye Alignment: Within Functional Limits Perception Perception: Within Functional Limits Praxis Praxis: Intact   Cognition Overall Cognitive Status: Appears within functional limits for tasks assessed Arousal/Alertness: Awake/alert Orientation Level: Oriented X4 Memory: Appears intact Awareness: Appears intact Problem Solving: Appears intact Safety/Judgment: Appears intact Comments: appears intact during conversation on eval  Sensation Sensation Light Touch: Impaired by gross assessment Stereognosis: Impaired by gross assessment Hot/Cold: Not tested Proprioception: Not tested Additional Comments: LE's appear intact to light tough, hypersensitivty mostly in LUE Coordination Gross Motor Movements are Fluid and Coordinated: No Fine Motor Movements are Fluid and Coordinated: Yes (slow movements bilaterally)  Motor  Motor Motor: Other (comment);Ataxia ((gait))   Trunk/Postural Assessment  Cervical Assessment Cervical Assessment: Exceptions to [facility] (cervical collar) Thoracic Assessment Thoracic Assessment: Exceptions to [facility]  (kyphotic posture) Lumbar Assessment Lumbar Assessment: Within Functional Limits Postural Control Postural Control: Deficits on evaluation   Balance Balance Balance Assessed: Yes Static Sitting Balance Static Sitting - Level of Assistance: 5: Stand by assistance Dynamic Sitting Balance Dynamic Sitting - Level of Assistance: 5: Stand by assistance Static Standing Balance Static Standing - Level of Assistance: 4: Min assist Dynamic Standing Balance Dynamic Standing - Level of Assistance: 3: Mod assist  Extremity/Trunk Assessment RUE Assessment RUE Assessment: Within Functional Limits (range WFL, can benefit from strengthening, hypersensitive)  LUE Assessment LUE Assessment: Within Functional Limits (range WFL, can benefit from strengthening, hypersensitive)  See FIM for current functional status  Refer to Care Plan for Long Term Goals  Recommendations for other services: None  Discharge Criteria: Patient will be discharged from OT if patient refuses treatment 3 consecutive times without medical reason, if treatment goals not met, if there is a change in medical status, if patient makes no progress towards goals or if patient is discharged from hospital.  The above assessment, treatment plan, treatment alternatives and goals were discussed and mutually agreed upon: by patient  SESSION NOTE [phone] - 55 Minutes Individual Therapy Patient with 5/10 complaints of pain throughout bilateral shoulders; RN made aware Initial 1:1 occupational therapy evaluation completed. Focused skilled intervention on bed mobility, UB/LB dressing, sit/stands, functional ambulation around room using platform rolling walker, grooming tasks standing at sink, and recliner transfer. At end of session left patient seated in recliner with call bell & phone within reach.   Kloe Oates 12/07/2011, 10:35 AM

## 2011-12-07 NOTE — Significant Event (Signed)
Hypoglycemic Event  CBG:57  Treatment: 15 GM carbohydrate snack  Symptoms: None  Follow-up CBG: Time:2148 CBG Result:62  Possible Reasons for Event: Unknown  Comments/MD notified:Notified Pam Love, PA no new orders given at this time    Bonnee Quin  Remember to initiate Hypoglycemia Order Set & complete

## 2011-12-07 NOTE — Progress Notes (Signed)
Patient information reviewed and entered into [facility] system by Perle Gibbon, RN, CRRN, PPS Coordinator.  Information including medical coding and functional independence measure will be reviewed and updated through discharge.     Per nursing patient was given "Data Collection Information Summary for Patients in Inpatient Rehabilitation Facilities with attached "Privacy Act Statement-Health Care Records" upon admission.   

## 2011-12-08 ENCOUNTER — Inpatient Hospital Stay (HOSPITAL_COMMUNITY): Payer: Medicare Other | Admitting: Occupational Therapy

## 2011-12-08 ENCOUNTER — Inpatient Hospital Stay (HOSPITAL_COMMUNITY): Payer: Medicare Other | Admitting: *Deleted

## 2011-12-08 ENCOUNTER — Inpatient Hospital Stay (HOSPITAL_COMMUNITY): Payer: Medicare Other

## 2011-12-08 DIAGNOSIS — M4712 Other spondylosis with myelopathy, cervical region: Secondary | ICD-10-CM

## 2011-12-08 DIAGNOSIS — W19XXXA Unspecified fall, initial encounter: Secondary | ICD-10-CM

## 2011-12-08 DIAGNOSIS — I2510 Atherosclerotic heart disease of native coronary artery without angina pectoris: Secondary | ICD-10-CM

## 2011-12-08 DIAGNOSIS — E1165 Type 2 diabetes mellitus with hyperglycemia: Secondary | ICD-10-CM

## 2011-12-08 DIAGNOSIS — IMO0002 Reserved for concepts with insufficient information to code with codable children: Secondary | ICD-10-CM

## 2011-12-08 DIAGNOSIS — Z5189 Encounter for other specified aftercare: Secondary | ICD-10-CM

## 2011-12-08 LAB — GLUCOSE, CAPILLARY: Glucose-Capillary: 202 mg/dL — ABNORMAL HIGH (ref 70–99)

## 2011-12-08 NOTE — Progress Notes (Signed)
Physical Therapy Session Note  Patient Details  Name: [name] MRN: [id number] Date of Birth: Dec 02, 1928  Today's Date: 12/08/2011 Time: 1400-1500 Time Calculation (min): 60 min  Short Term Goals: Week 1:  PT Short Term Goal 1 (Week 1): = LTGs  Skilled Therapeutic Interventions/Progress Updates:    Functional mobility training in the room for transfers OOB with RW, gait to/from bathroom, and hygiene at the sink with overall min A. Gait with RW for endurance and strengthening x 140' cueing for posture and not letting RLE cross midline. 1 episode of R knee buckling requiring min A to recover. Dynamic standing balance activities on compliant surface while manipulating cones to simulate functional task with close S/steady A intermittently when no UE support. Toe taps and progressing to step ups for strengthening and coordination with LE's; increased difficulty noted with RLE. Without UE support pt unable to complete on R and required mod A for L, with UE support overall steady A. LE therex for strengthening to aid with transfers and mobility with 2# ankle weight bilaterally for 2 sets of 10 reps of LAQ and seated marches. Supine bridging x 10 reps x 2 sets. Pt with complaints of dizziness throughout, rest breaks given as needed. BP = 140/72. Positioned in recliner at end of session with LEs elevated.  Therapy Documentation Precautions:  Precautions Precautions: Fall Required Braces or Orthoses: Cervical Brace Cervical Brace: Soft collar Restrictions Weight Bearing Restrictions: No   Pain:  c/o pain in shoulders - premedicated. Locomotion : Ambulation Ambulation/Gait Assistance: 4: Min assist   See FIM for current functional status  Therapy/Group: Individual Therapy  Karolee Stamps [facility] 12/08/2011, 4:01 PM

## 2011-12-08 NOTE — Progress Notes (Signed)
Subjective/Complaints: Sugars low. Walking with tech in the room. Knee tender still as well as neck A 12 point review of systems has been performed and if not noted above is otherwise negative.   Objective: Vital Signs: Blood pressure 129/54, pulse 69, temperature 98.1 F (36.7 C), temperature source Oral, resp. rate 18, height 5\' 7"  (1.702 m), SpO2 93.00%. No results found.  Basename 12/07/11 0929  WBC 6.4  HGB 9.5*  HCT 29.0*  PLT 359    Basename 12/07/11 0545  NA 134*  K 4.5  CL 98  CO2 25  GLUCOSE 75  BUN 15  CREATININE 1.05  CALCIUM 9.5   CBG (last 3)   Basename 12/08/11 0811 12/08/11 0729 12/07/11 2218  GLUCAP 78 50* 90    Wt Readings from Last 3 Encounters:  11/29/11 64.3 kg (141 lb 12.1 oz)  11/29/11 64.3 kg (141 lb 12.1 oz)  07/12/11 66.225 kg (146 lb)    Physical Exam:  HENT:  Head: Normocephalic.  Eyes:  Pupils round and reactive to light  Neck:  Soft cervical collar in place  Cardiovascular: Normal rate and regular rhythm.  Pulmonary/Chest: Breath sounds normal. She has no wheezes.  Abdominal: Bowel sounds are normal. She exhibits no distension. There is no tenderness.  Musculoskeletal: She exhibits no edema. Tender around surgical site and bilateral traps. Right knee tender at the patella, mild swelling. No bruise Neurological: She is alert.  Patient is oriented to name, year and place. He had some difficulty recalling full of dense related to her fall. She follows two-step commands  Psychiatric: She has a flat affect.  Ataxia FNF in BUE  Motor 3/5 in B Delt, 3+ bi,tri, grip 3,  3-4 B HF,KE,ADF with some pain noted at right knee especially with weight bearing Sensory mildly reduced Lt touch in hands left more than right. Minimal sensory loss in the legs   Assessment/Plan: 1. Functional deficits secondary to cervical myelopathy/central cord C5-6 which require 3+ hours per day of interdisciplinary therapy in a comprehensive inpatient rehab  setting. Physiatrist is providing close team supervision and 24 hour management of active medical problems listed below. Physiatrist and rehab team continue to assess barriers to discharge/monitor patient progress toward functional and medical goals. FIM: FIM - Bathing Bathing: 0: Activity did not occur  FIM - Upper Body Dressing/Undressing Upper body dressing/undressing steps patient completed: Thread/unthread right sleeve of front closure shirt/dress;Thread/unthread left sleeve of front closure shirt/dress;Pull shirt around back of front closure shirt/dress;Button/unbutton shirt Upper body dressing/undressing: 4: Steadying assist FIM - Lower Body Dressing/Undressing Lower body dressing/undressing steps patient completed: Thread/unthread right pants leg;Thread/unthread left pants leg;Don/Doff right sock;Don/Doff left sock Lower body dressing/undressing: 4: Min-Patient completed 75 plus % of tasks  FIM - Toileting Toileting steps completed by patient: Adjust clothing after toileting;Performs perineal hygiene;Adjust clothing prior to toileting Toileting Assistive Devices: Grab bar or rail for support Toileting: 5: Supervision: Safety issues/verbal cues  FIM - Diplomatic Services operational officer Devices: Bedside commode Toilet Transfers: 4-To toilet/BSC: Min A (steadying Pt. > 75%);4-From toilet/BSC: Min A (steadying Pt. > 75%)  FIM - Bed/Chair Transfer Bed/Chair Transfer: 4: Supine > Sit: Min A (steadying Pt. > 75%/lift 1 leg);4: Sit > Supine: Min A (steadying pt. > 75%/lift 1 leg);4: Bed > Chair or W/C: Min A (steadying Pt. > 75%);4: Chair or W/C > Bed: Min A (steadying Pt. > 75%)  FIM - Locomotion: Wheelchair Locomotion: Wheelchair: 1: Total Assistance/staff pushes wheelchair (Pt<25%) FIM - Locomotion: Ambulation Locomotion: Ambulation Assistive  Devices: Designer, industrial/product Ambulation/Gait Assistance: 4: Min assist Locomotion: Ambulation: 1: Travels less than 50 ft with minimal  assistance (Pt.>75%)  Comprehension Comprehension Mode: Auditory Comprehension: 5-Follows basic conversation/direction: With no assist  Expression Expression Mode: Verbal Expression: 5-Expresses basic needs/ideas: With extra time/assistive device  Social Interaction Social Interaction: 5-Interacts appropriately 90% of the time - Needs monitoring or encouragement for participation or interaction.  Problem Solving Problem Solving: 5-Solves basic 90% of the time/requires cueing < 10% of the time  Memory Memory: 4-Recognizes or recalls 75 - 89% of the time/requires cueing 10 - 24% of the time  Medical Problem List and Plan:  1. Cervical myelopathy status post ACDF 11/30/2011  2. DVT Prophylaxis/Anticoagulation: SCDs. Monitor for any signs of DVT  3. Pain Management: Neurontin 300 mg 3 times a day, Robaxin and oxycodone as needed. Monitor with increased mobility  -Right knee may be slightly contused, or this may be a prepatellar bursitis  -utilize ice, voltaren gel for knee  4. Mood: Cymbalta 30 mg daily, Ativan 0.5 mg every 8 hours as needed. Provide emotional support and positive reinforcement  5. Neuropsych: This patient is capable of making decisions on his/her own behalf.  6. Coronary artery disease a coronary artery bypass grafting. Aspirin 81 mg daily resumed. Resumed Plavix 75 mg daily in 2 weeks  7. diabetes mellitus. Hemoglobin A1c 7.5  -.Glyburide 5 mg twice a day (held),   -Glucophage 500 mg daily, Januvia 100 mg daily to continue but potentially adjust if sugars remain low 8. Hypertension. Norvasc 10 mg daily. Monitor with increased mobility  9. Hyperlipidemia. Lipitor  LOS (Days) 2 A FACE TO FACE EVALUATION WAS PERFORMED  SWARTZ,ZACHARY T 12/08/2011, 8:39 AM

## 2011-12-08 NOTE — Significant Event (Signed)
Hypoglycemic Event  CBG: 60  Treatment: 15 GM carbohydrate snack  Symptoms: None  Follow-up CBG: Time:0810 CBG Result:78  Possible Reasons for Event: Inadequate meal intake  Comments/MD notified:yes    [name]  Remember to initiate Hypoglycemia Order Set & complete

## 2011-12-08 NOTE — Progress Notes (Signed)
Nurse with concerns regarding variable po intake and low BS.  Will discontinue glyburide for now.  Monitor over the weekend. Anticipate BS to stabilize out over the next 24 hours off medication.

## 2011-12-08 NOTE — Progress Notes (Signed)
Physical Therapy Session Note  Patient Details  Name: [name] MRN: [id number] Date of Birth: 01-09-1929  Today's Date: 12/08/2011 Time: 8:30-9:15 ( )    Short Term Goals: Week 1:  PT Short Term Goal 1 (Week 1): = LTGs  Skilled Therapeutic Interventions/Progress Updates:  Tx focused on gait training, transfer training, LE strengthening, and balance.  Pt c/o being very cold this tx, needing encouragement to participate. Pt also continues to c/o new memory problems: cannot recall daughter's phone number this morning.   Supine>sit with flat bed and rail with S for technique and increased time required.  Sit<>stand and stand-step transfers with min A for steadying only and cues for hand placement.  Gait training 1x85' with RW and Min-guard with cues for upright posture. Pt with R ER. Distance limited by pain.  Once in gym, pt c/o room spinning or chair feeling like it's moving. BP checked 123/70. No nystagmus. Nursing made aware. Pt able to continue tx with encouragement.   Serial sit<>stand without UE from 22" mat with Min A initially, progressing to close S, for functional LE strengthening. 3x5 with seated rest between trials. Educated pt on importance of upright posture in sitting with tactile cues and demonstration slight chin tuck.   Dynamic gait with RW through obstacle course for cone weaving tight turns and stepping over obstacles with min A. Pt has most difficulty with managing turns, tending to cross over feet and moving outside RW. Cues given for correction, but pt unable to adjust.       Therapy Documentation Precautions:  Precautions Precautions: Fall Required Braces or Orthoses: Cervical Brace Cervical Brace: Soft collar Restrictions Weight Bearing Restrictions: No Pain: Pain Assessment Pain Score:   8 Pt premedicated, adjusted tx prn   Locomotion : Ambulation Ambulation/Gait Assistance: 4: Min assist   See FIM for current functional  status  Therapy/Group: Individual Therapy  Virl Cagey, PT 12/08/2011, 9:03 AM

## 2011-12-08 NOTE — Progress Notes (Signed)
Occupational Therapy Session Note  Patient Details  Name: Dannapaola Holloman MRN: [id number] Date of Birth: August 26, 1928  Today's Date: 12/08/2011 Time: 1030-1130 Time Calculation (min): 60 min  Short Term Goals: Week 1:  OT Short Term Goal 1 (Week 1): Patient will perform UB/LB bathing with supervision OT Short Term Goal 2 (Week 1): Patient will perform UB/LB dressing with supervision OT Short Term Goal 3 (Week 1): Patient will perform toilet transfer with supervision OT Short Term Goal 4 (Week 1): Patient will perform tub/shower transfer with supervision  Skilled Therapeutic Interventions/Progress Updates:  Patient found seated in w/c asleep, therapist woke patient and patient with 8/10 complaints of pain - RN made aware. Patient then engaged in functional mobility throughout room using rolling walker for shower stall transfer and UB/LB bathing at shower level. Patient then ambulated back to w/c for UB./LB dressing. Patient took rest breaks prn throughout ADL and completed ADL at an overall supervision -> steady assist. Patient stood at sink to complete grooming tasks with steady assist. At end of session left patient seated in w/c beside bed with call bell & phone within reach.   Precautions:  Precautions Precautions: Fall Required Braces or Orthoses: Cervical Brace Cervical Brace: Soft collar Restrictions Weight Bearing Restrictions: No  See FIM for current functional status  Therapy/Group: Individual Therapy  Adelayde Minney 12/08/2011, 11:34 AM

## 2011-12-09 ENCOUNTER — Inpatient Hospital Stay (HOSPITAL_COMMUNITY): Payer: Medicare Other | Admitting: Occupational Therapy

## 2011-12-09 ENCOUNTER — Inpatient Hospital Stay (HOSPITAL_COMMUNITY): Payer: Medicare Other | Admitting: Physical Therapy

## 2011-12-09 LAB — GLUCOSE, CAPILLARY
Glucose-Capillary: 85 mg/dL (ref 70–99)
Glucose-Capillary: 145 mg/dL — ABNORMAL HIGH (ref 70–99)

## 2011-12-09 LAB — CULTURE, BLOOD (ROUTINE X 2): Culture: NO GROWTH

## 2011-12-09 LAB — VITAMIN D 1,25 DIHYDROXY: Vitamin D2 1, 25 (OH)2: <8 pg/mL

## 2011-12-09 MED ORDER — LORAZEPAM 0.5 MG PO TABS
0.5000 mg | ORAL_TABLET | Freq: Two times a day (BID) | ORAL | Status: DC
  Administered 2011-12-09 – 2011-12-14 (×10): 0.5 mg via ORAL
  Filled 2011-12-09 (×10): qty 1

## 2011-12-09 NOTE — Progress Notes (Signed)
Occupational Therapy Session Note  Patient Details  Name: [name] MRN: [id number] Date of Birth: 1928-12-21  Today's Date: 12/09/2011 Time: 1000-1058 Time Calculation (min): 58 min  Short Term Goals: Week 1:  OT Short Term Goal 1 (Week 1): Patient will perform UB/LB bathing with supervision OT Short Term Goal 2 (Week 1): Patient will perform UB/LB dressing with supervision OT Short Term Goal 3 (Week 1): Patient will perform toilet transfer with supervision OT Short Term Goal 4 (Week 1): Patient will perform tub/shower transfer with supervision  Skilled Therapeutic Interventions/Progress Updates:    Bathing and dressing session.  Pt ambulated to the shower using her RW.  Needed min assist to transition from sidelying to sitting.  Showered sit to stand, crossing LEs to wash her lower legs and feet.  Performed dressing sit to stand at the sink.  2-3 rest breaks secondary to fatigue.  Min assist for all sit to stand with min instructional cueing for hand placement.  Performed grooming task of brushing her teeth in standing as well.  Finished by taking pt down to the gym in her wheelchair and performing UE ergonometer for 4 mins without resistance.  Pt needed one rest break after 2 mins.    Therapy Documentation Precautions:  Precautions Precautions: Fall Required Braces or Orthoses: Cervical Brace Cervical Brace: Soft collar Restrictions Weight Bearing Restrictions: No  Pain: Pain Assessment Pain Assessment: 0-10 Pain Score:   6 Pain Type: Surgical pain Pain Location: Neck Pain Intervention(s): Repositioned;Medication (See eMAR) ADL: See FIM for current functional status  Therapy/Group: Individual Therapy  Blandina Renaldo OTR/L 12/09/2011, 10:58 AM

## 2011-12-09 NOTE — Progress Notes (Signed)
Subjective/Complaints: Sugars low. Walking with tech in the room. Knee tender still as well as neck A 12 point review of systems has been performed and if not noted above is otherwise negative.  C/o chewing difficulties, anxiety  Objective: Vital Signs: Blood pressure 113/51, pulse 65, temperature 98.6 F (37 C), temperature source Oral, resp. rate 19, height 5\' 7"  (1.702 m), SpO2 99.00%. No results found.  Basename 12/07/11 0929  WBC 6.4  HGB 9.5*  HCT 29.0*  PLT 359    Basename 12/07/11 0545  NA 134*  K 4.5  CL 98  CO2 25  GLUCOSE 75  BUN 15  CREATININE 1.05  CALCIUM 9.5   CBG (last 3)   Basename 12/09/11 0734 12/08/11 2041 12/08/11 1652  GLUCAP 85 202* 82    Wt Readings from Last 3 Encounters:  11/29/11 141 lb 12.1 oz (64.3 kg)  11/29/11 141 lb 12.1 oz (64.3 kg)  07/12/11 146 lb (66.225 kg)    Physical Exam:  HENT:  Head: Normocephalic.  Eyes:  Pupils round and reactive to light  Neck:  Soft cervical collar in place  Cardiovascular: Normal rate and regular rhythm.  Pulmonary/Chest: Breath sounds normal. She has no wheezes.  Abdominal: Bowel sounds are normal. She exhibits no distension. There is no tenderness.  Musculoskeletal: She exhibits no edema. Tender around surgical site and bilateral traps. Right knee tender at the patella, mild swelling. No bruise Neurological: She is alert.  Patient is oriented to name, year and place. He had some difficulty recalling full of dense related to her fall. She follows two-step commands  Psychiatric: She has a flat affect.  Ataxia FNF in BUE  Motor 3/5 in B Delt, 3+ bi,tri, grip 3,  3-4 B HF,KE,ADF with some pain noted at right knee especially with weight bearing Sensory mildly reduced Lt touch in hands left more than right. Minimal sensory loss in the legs   Assessment/Plan: 1. Functional deficits secondary to cervical myelopathy/central cord C5-6 which require 3+ hours per day of interdisciplinary therapy in a  comprehensive inpatient rehab setting. Physiatrist is providing close team supervision and 24 hour management of active medical problems listed below. Physiatrist and rehab team continue to assess barriers to discharge/monitor patient progress toward functional and medical goals. FIM: FIM - Bathing Bathing Steps Patient Completed: Chest;Right Arm;Left Arm;Abdomen;Front perineal area;Buttocks;Right upper leg;Left upper leg;Right lower leg (including foot);Left lower leg (including foot) Bathing: 5: Supervision: Safety issues/verbal cues  FIM - Upper Body Dressing/Undressing Upper body dressing/undressing steps patient completed: Thread/unthread right bra strap;Thread/unthread left bra strap;Thread/unthread right sleeve of pullover shirt/dresss;Thread/unthread left sleeve of pullover shirt/dress;Put head through opening of pull over shirt/dress;Pull shirt over trunk Upper body dressing/undressing: 4: Min-Patient completed 75 plus % of tasks FIM - Lower Body Dressing/Undressing Lower body dressing/undressing steps patient completed: Thread/unthread right underwear leg;Thread/unthread left underwear leg;Pull underwear up/down;Thread/unthread right pants leg;Thread/unthread left pants leg;Pull pants up/down;Don/Doff right sock;Don/Doff left sock;Don/Doff right shoe;Don/Doff left shoe;Fasten/unfasten right shoe;Fasten/unfasten left shoe Lower body dressing/undressing: 4: Steadying Assist  FIM - Toileting Toileting steps completed by patient: Adjust clothing prior to toileting;Performs perineal hygiene;Adjust clothing after toileting Toileting Assistive Devices: Grab bar or rail for support Toileting: 4: Steadying assist  FIM - Diplomatic Services operational officer Devices: Elevated toilet seat;Walker Toilet Transfers: 4-To toilet/BSC: Min A (steadying Pt. > 75%);4-From toilet/BSC: Min A (steadying Pt. > 75%)  FIM - Bed/Chair Transfer Bed/Chair Transfer Assistive Devices: Diplomatic Services operational officer: 5: Supine > Sit: Supervision (verbal cues/safety issues);5: Sit > Supine:  Supervision (verbal cues/safety issues);4: Bed > Chair or W/C: Min A (steadying Pt. > 75%);4: Chair or W/C > Bed: Min A (steadying Pt. > 75%)  FIM - Locomotion: Wheelchair Locomotion: Wheelchair: 1: Travels less than 50 ft with moderate assistance (Pt: 50 - 74%) FIM - Locomotion: Ambulation Locomotion: Ambulation Assistive Devices: Designer, industrial/product Ambulation/Gait Assistance: 4: Min assist Locomotion: Ambulation: 2: Travels 50 - 149 ft with minimal assistance (Pt.>75%)  Comprehension Comprehension Mode: Auditory Comprehension: 6-Follows complex conversation/direction: With extra time/assistive device  Expression Expression Mode: Verbal Expression: 5-Expresses complex 90% of the time/cues < 10% of the time  Social Interaction Social Interaction: 6-Interacts appropriately with others with medication or extra time (anti-anxiety, antidepressant).  Problem Solving Problem Solving: 5-Solves complex 90% of the time/cues < 10% of the time  Memory Memory: 5-Recognizes or recalls 90% of the time/requires cueing < 10% of the time  Medical Problem List and Plan:  1. Cervical myelopathy status post ACDF 11/30/2011  2. DVT Prophylaxis/Anticoagulation: SCDs. Monitor for any signs of DVT  3. Pain Management: Neurontin 300 mg 3 times a day, Robaxin and oxycodone as needed. Monitor with increased mobility  -Right knee may be slightly contused, or this may be a prepatellar bursitis  -utilize ice, voltaren gel for knee  4. Mood: Cymbalta 30 mg daily, Ativan 0.5 mg every 12 hours. Provide emotional support and positive reinforcement  5. Neuropsych: This patient is capable of making decisions on his/her own behalf.  6. Coronary artery disease a coronary artery bypass grafting. Aspirin 81 mg daily resumed. Resumed Plavix 75 mg daily in 2 weeks  7. diabetes mellitus. Hemoglobin A1c 7.5  -.Glyburide 5 mg twice a day  (held),   -Glucophage 500 mg daily, Januvia 100 mg daily to continue but potentially adjust if sugars remain low 8. Hypertension. Norvasc 10 mg daily. Monitor with increased mobility  9. Hyperlipidemia. Lipitor 10. Soft diet per family request  LOS (Days) 3 A FACE TO FACE EVALUATION WAS PERFORMED  Alex Plotnikov 12/09/2011, 9:35 AM

## 2011-12-09 NOTE — Progress Notes (Signed)
Physical Therapy Session Note  Patient Details  Name: [name] MRN: [id number] Date of Birth: 1929/03/11  Today's Date: 12/09/2011 Time: 1100-1200 Time Calculation (min): 60 min  Short Term Goals: Week 1:  PT Short Term Goal 1 (Week 1): = LTGs  Therapy Documentation Precautions:  Precautions Precautions: Fall Required Braces or Orthoses: Cervical Brace Cervical Brace: Soft collar Restrictions Weight Bearing Restrictions: No Pain: Pain Assessment: 0-10 Pain Score:  3 Pain Type: Surgical pain Pain Location: Neck Pain Intervention(s): Repositioned;Medication (See eMAR)  Gait Training:(15') Using RW 2 x 170', then without assistive device 2 x 150' min-A (note: R LE externally rotated > L) Therapeutic Exercise:(30' Nu-Step Level 2 x 10', LE exercises in sitting including 4# ankle weight for R LAQ Therapeutic Activity:(15') Transfer training sit<->stand and w/c<->chair S/Mod-I using RW  Patient ambulating with R LE externally rotated. No LOB or episodes of knees giving way.  Ambulating without assistive device with mildly ataxic gait.   Patient needing rest breaks with exercise and walking and will benefit from ongoing endurance strengthening.  See FIM for current functional status  Therapy/Group: Individual Therapy  [name] J 12/09/2011, 11:13 AM

## 2011-12-09 NOTE — Progress Notes (Signed)
Physical Therapy Session Note  Patient Details  Name: [name] MRN: [id number] Date of Birth: 09-Jan-1929  Today's Date: 12/09/2011 Time: 1300-1400 Time Calculation (min): 60 min  Short Term Goals: Week 1:  PT Short Term Goal 1 (Week 1): = LTGs  Therapy Documentation Precautions:  Precautions Precautions: Fall Required Braces or Orthoses: Cervical Brace Cervical Brace: Soft collar Restrictions Weight Bearing Restrictions: No Pain: Pain Assessment Pain Assessment: 0-10 Pain Score:   7 Pain Type: Acute pain;Surgical pain Pain Location: Neck Pain Orientation: Right;Left Pain Descriptors: Aching;Constant Pain Intervention(s): Medication (See eMAR)  Therapeutic Exercise:(15') B LE's in sitting Gait Training:(30') using RW 3 x 200' with S/Mod-I,  4 x 50' without assistive device with SBA Therapeutic Activity:(15') Transfer training, toilet, bed<->chair S/Mod-I, Sit<->stand S/Mod-I  See FIM for current functional status  Therapy/Group: Individual Therapy  Carle Dargan J 12/09/2011, 1:04 PM

## 2011-12-10 ENCOUNTER — Inpatient Hospital Stay (HOSPITAL_COMMUNITY): Payer: Medicare Other | Admitting: Physical Therapy

## 2011-12-10 LAB — GLUCOSE, CAPILLARY: Glucose-Capillary: 212 mg/dL — ABNORMAL HIGH (ref 70–99)

## 2011-12-10 NOTE — Progress Notes (Signed)
Subjective/Complaints: Sugars low. Walking with tech in the room. Knee tender still as well as neck A 12 point review of systems has been performed and if not noted above is otherwise negative.  C/o chewing difficulties, anxiety - better C/o neck pain - chronic  Objective: Vital Signs: Blood pressure 119/67, pulse 71, temperature 98 F (36.7 C), temperature source Oral, resp. rate 18, height 5\' 7"  (1.702 m), SpO2 96.00%. No results found. No results found for this basename: WBC:2,HGB:2,HCT:2,PLT:2 in the last 72 hours No results found for this basename: NA:2,K:2,CL:2,CO2:2,GLUCOSE:2,BUN:2,CREATININE:2,CALCIUM:2 in the last 72 hours CBG (last 3)   Basename 12/09/11 2111 12/09/11 1701 12/09/11 1149  GLUCAP 169* 252* 145*    Wt Readings from Last 3 Encounters:  11/29/11 141 lb 12.1 oz (64.3 kg)  11/29/11 141 lb 12.1 oz (64.3 kg)  07/12/11 146 lb (66.225 kg)    Physical Exam:  HENT:  Head: Normocephalic.  Eyes:  Pupils round and reactive to light  Neck:  Soft cervical collar in place  Cardiovascular: Normal rate and regular rhythm.  Pulmonary/Chest: Breath sounds normal. She has no wheezes.  Abdominal: Bowel sounds are normal. She exhibits no distension. There is no tenderness.  Musculoskeletal: She exhibits no edema. Tender around surgical site and bilateral traps. Right knee tender at the patella, mild swelling. No bruise Neurological: She is alert.  Patient is oriented to name, year and place. He had some difficulty recalling full of dense related to her fall. She follows two-step commands  Psychiatric: She has a flat affect.  Ataxia FNF in BUE  Motor 3/5 in B Delt, 3+ bi,tri, grip 3,  3-4 B HF,KE,ADF with some pain noted at right knee especially with weight bearing Sensory mildly reduced Lt touch in hands left more than right. Minimal sensory loss in the legs   Assessment/Plan: 1. Functional deficits secondary to cervical myelopathy/central cord C5-6 which require 3+  hours per day of interdisciplinary therapy in a comprehensive inpatient rehab setting. Physiatrist is providing close team supervision and 24 hour management of active medical problems listed below. Physiatrist and rehab team continue to assess barriers to discharge/monitor patient progress toward functional and medical goals. FIM: FIM - Bathing Bathing Steps Patient Completed: Chest;Right Arm;Left Arm;Abdomen;Front perineal area;Buttocks;Right upper leg;Left upper leg;Right lower leg (including foot);Left lower leg (including foot) Bathing: 5: Supervision: Safety issues/verbal cues  FIM - Upper Body Dressing/Undressing Upper body dressing/undressing steps patient completed: Thread/unthread right bra strap;Thread/unthread left bra strap;Pull shirt over trunk;Put head through opening of pull over shirt/dress;Thread/unthread left sleeve of pullover shirt/dress;Thread/unthread right sleeve of pullover shirt/dresss Upper body dressing/undressing: 5: Set-up assist to: Obtain clothing/put away FIM - Lower Body Dressing/Undressing Lower body dressing/undressing steps patient completed: Thread/unthread right underwear leg;Thread/unthread left underwear leg;Pull underwear up/down;Thread/unthread right pants leg;Thread/unthread left pants leg;Pull pants up/down;Don/Doff right sock;Don/Doff left sock;Don/Doff right shoe;Don/Doff left shoe;Fasten/unfasten right shoe;Fasten/unfasten left shoe Lower body dressing/undressing: 5: Supervision: Safety issues/verbal cues  FIM - Toileting Toileting steps completed by patient: Adjust clothing prior to toileting;Performs perineal hygiene;Adjust clothing after toileting Toileting Assistive Devices: Grab bar or rail for support Toileting: 4: Steadying assist  FIM - Diplomatic Services operational officer Devices: Grab bars;Walker Toilet Transfers: 4-To toilet/BSC: Min A (steadying Pt. > 75%);4-From toilet/BSC: Min A (steadying Pt. > 75%)  FIM - Bed/Chair  Transfer Bed/Chair Transfer Assistive Devices: Bed rails Bed/Chair Transfer: 4: Bed > Chair or W/C: Min A (steadying Pt. > 75%);4: Chair or W/C > Bed: Min A (steadying Pt. > 75%)  FIM - Locomotion:  Wheelchair Locomotion: Wheelchair: 1: Travels less than 50 ft with moderate assistance (Pt: 50 - 74%) FIM - Locomotion: Ambulation Locomotion: Ambulation Assistive Devices: Designer, industrial/product Ambulation/Gait Assistance: 4: Min assist Locomotion: Ambulation: 2: Travels 50 - 149 ft with minimal assistance (Pt.>75%)  Comprehension Comprehension Mode: Auditory Comprehension: 6-Follows complex conversation/direction: With extra time/assistive device  Expression Expression Mode: Verbal Expression: 5-Expresses complex 90% of the time/cues < 10% of the time  Social Interaction Social Interaction: 6-Interacts appropriately with others with medication or extra time (anti-anxiety, antidepressant).  Problem Solving Problem Solving: 5-Solves complex 90% of the time/cues < 10% of the time  Memory Memory: 5-Recognizes or recalls 90% of the time/requires cueing < 10% of the time  Medical Problem List and Plan:  1. Cervical myelopathy status post ACDF 11/30/2011  2. DVT Prophylaxis/Anticoagulation: SCDs. Monitor for any signs of DVT  3. Pain Management: Neurontin 300 mg 3 times a day, Robaxin and oxycodone as needed. Monitor with increased mobility  -Right knee may be slightly contused, or this may be a prepatellar bursitis  -utilize ice, voltaren gel for knee  4. Mood: Cymbalta 30 mg daily, Ativan 0.5 mg every 12 hours. Provide emotional support and positive reinforcement  5. Neuropsych: This patient is capable of making decisions on his/her own behalf.  6. Coronary artery disease a coronary artery bypass grafting. Aspirin 81 mg daily resumed. Resumed Plavix 75 mg daily in 2 weeks  7. diabetes mellitus. Hemoglobin A1c 7.5  -.Glyburide 5 mg twice a day (held),   -Glucophage 500 mg daily, Januvia 100 mg  daily to continue but potentially adjust if sugars remain low 8. Hypertension. Norvasc 10 mg daily. Monitor with increased mobility  9. Hyperlipidemia. Lipitor 10. Soft diet per family request 11. Chronic neck pain - Kpad prn  LOS (Days) 4 A FACE TO FACE EVALUATION WAS PERFORMED  Alex Eastin Swing 12/10/2011, 9:50 AM

## 2011-12-10 NOTE — Progress Notes (Signed)
Physical Therapy Note  Patient Details  Name: [name] MRN: [id number] Date of Birth: [birth year]  Today's Date: 12/10/2011  1530-1625 (55 minutes) individual Pain: no complaint of pain  Focus of treatment: Gait training/endurance with/without assistive device; therapeutic activities focused on standing balance  Treatment: Gait to/from room 120 feet RW SBA; Standing balance activities- toe taps to 6 inch step X 15 close SBA with no loss of balance; standing with pertubations in all directions with no loss  of balance; stepping over obstacle and back  X 15 min assist with difficulty and loss of balance posteriorly ; alternate stepping to targets on floor changing BOS with difficulty maintaining balance.; gait 160 feet X 2 without AD min to close SBA with minimal decreased trunk extension.     Rona Tomson,JIM 12/10/2011, 8:11 AM

## 2011-12-10 NOTE — Plan of Care (Signed)
Problem: RH PAIN MANAGEMENT Goal: RH STG PAIN MANAGED AT OR BELOW PT'S PAIN GOAL Less than 3  Outcome: Not Progressing Patient continues to complain of neck pain about an 8 out of 10 with little relief at times with PRN medications.

## 2011-12-11 ENCOUNTER — Inpatient Hospital Stay (HOSPITAL_COMMUNITY): Payer: Medicare Other

## 2011-12-11 ENCOUNTER — Inpatient Hospital Stay (HOSPITAL_COMMUNITY): Payer: Medicare Other | Admitting: Occupational Therapy

## 2011-12-11 LAB — GLUCOSE, CAPILLARY
Glucose-Capillary: 150 mg/dL — ABNORMAL HIGH (ref 70–99)
Glucose-Capillary: 205 mg/dL — ABNORMAL HIGH (ref 70–99)

## 2011-12-11 MED ORDER — GLYBURIDE 5 MG PO TABS
5.0000 mg | ORAL_TABLET | Freq: Every day | ORAL | Status: DC
  Administered 2011-12-12: 5 mg via ORAL
  Filled 2011-12-11 (×2): qty 1

## 2011-12-11 NOTE — Progress Notes (Signed)
Physical Therapy Session Note  Patient Details  Name: [name] MRN: [id number] Date of Birth: 05/18/1928  Today's Date: 12/11/2011 Time: 1300-1350 Time Calculation (min): 50 min  Short Term Goals: Week 1:  PT Short Term Goal 1 (Week 1): = LTGs  Skilled Therapeutic Interventions/Progress Updates:   Gait and functional mobility training on unit with RW with close S overall, cues to use RW when transferring to toilet or in home environment situation. Simulated car transfer with overall S for safety from van height. Nustep for functional strengthening and endurance x 6 min on level 4. Introduced [employer] for strengthening and balance with 2# ankle weights for LAQ, standing hip abduction, standing hamstring curls, heel and toe raises and mini squats. Limited with mini squats due to R knee pain.   Therapy Documentation Precautions:  Precautions Precautions: Fall Required Braces or Orthoses: Cervical Brace Cervical Brace: Soft collar Restrictions Weight Bearing Restrictions: No Pain: C/o 7/10 neck pain, notified RN.     See FIM for current functional status  Therapy/Group: Individual Therapy  Karolee Stamps [facility] 12/11/2011, 2:10 PM

## 2011-12-11 NOTE — Progress Notes (Signed)
Physical Therapy Session Note  Patient Details  Name: [name] MRN: [id number] Date of Birth: 07-12-1928  Today's Date: 12/11/2011 Time: 0830-0925 Time Calculation (min): 55 min  Short Term Goals: Week 1:  PT Short Term Goal 1 (Week 1): = LTGs  Skilled Therapeutic Interventions/Progress Updates:    Gait training with RW in controlled environment and in dynamic setting to simulate home environment negotiating turns, stepping over objects, and walking on compliant surface with overall S/steady A intermittently when LOB posterior. Rest breaks as needed due to fatigue. Stair training for home entry with 1 rail with steady A up/down 5 steps. Functional mobility training in home environment in ADL apartment to navigate in kitchen and bedroom setting, getting things out of cabinets, bed mobility in soft bed (height similar to her bed at home), and maneuvering with RW on carpeted surface and changes in surface. Pt required close S up to steady A, and cues for safe techniques. Educated on setting up kitchen and bedroom in ways that make it safer and more efficient for her to obtain items. Verbalized understanding.   Therapy Documentation Precautions:  Precautions Precautions: Fall Required Braces or Orthoses: Cervical Brace Cervical Brace: Soft collar Restrictions Weight Bearing Restrictions: No   Pain: C/o 8/10 neck pain - medicated by RN at beginning of session. Gel applied to the knee.  See FIM for current functional status  Therapy/Group: Individual Therapy  Karolee Stamps [facility] 12/11/2011, 9:31 AM

## 2011-12-11 NOTE — Progress Notes (Signed)
Subjective/Complaints: No major issues over weekend. Appetite perhaps a little better A 12 point review of systems has been performed and if not noted above is otherwise negative.   Objective: Vital Signs: Blood pressure 99/65, pulse 63, temperature 98.2 F (36.8 C), temperature source Oral, resp. rate 17, height 5\' 7"  (1.702 m), SpO2 95.00%. No results found. No results found for this basename: WBC:2,HGB:2,HCT:2,PLT:2 in the last 72 hours No results found for this basename: NA:2,K:2,CL:2,CO2:2,GLUCOSE:2,BUN:2,CREATININE:2,CALCIUM:2 in the last 72 hours CBG (last 3)   Basename 12/11/11 0719 12/10/11 2033 12/10/11 1649  GLUCAP 99 238* 212*    Wt Readings from Last 3 Encounters:  11/29/11 64.3 kg (141 lb 12.1 oz)  11/29/11 64.3 kg (141 lb 12.1 oz)  07/12/11 66.225 kg (146 lb)    Physical Exam:  HENT:  Head: Normocephalic.  Eyes:  Pupils round and reactive to light  Neck:  Soft cervical collar in place  Cardiovascular: Normal rate and regular rhythm.  Pulmonary/Chest: Breath sounds normal. She has no wheezes.  Abdominal: Bowel sounds are normal. She exhibits no distension. There is no tenderness.  Musculoskeletal: She exhibits no edema. Tender around surgical site and bilateral traps. Right knee tender at the patella, mild swelling. No bruise Neurological: She is alert.  Patient is oriented to name, year and place. He had some difficulty recalling full of dense related to her fall. She follows two-step commands  Psychiatric: She has a flat affect.  Ataxia FNF in BUE  Motor 3/5 in B Delt, 3+ bi,tri, grip 3,  3-4 B HF,KE,ADF with some pain noted at right knee especially with weight bearing Sensory mildly reduced Lt touch in hands left more than right. Minimal sensory loss in the legs   Assessment/Plan: 1. Functional deficits secondary to cervical myelopathy/central cord C5-6 which require 3+ hours per day of interdisciplinary therapy in a comprehensive inpatient rehab  setting. Physiatrist is providing close team supervision and 24 hour management of active medical problems listed below. Physiatrist and rehab team continue to assess barriers to discharge/monitor patient progress toward functional and medical goals. FIM: FIM - Bathing Bathing Steps Patient Completed: Chest;Right Arm;Left Arm;Abdomen;Front perineal area;Buttocks;Right upper leg;Left upper leg;Right lower leg (including foot);Left lower leg (including foot) Bathing: 5: Supervision: Safety issues/verbal cues  FIM - Upper Body Dressing/Undressing Upper body dressing/undressing steps patient completed: Thread/unthread right bra strap;Thread/unthread left bra strap;Thread/unthread right sleeve of pullover shirt/dresss;Thread/unthread left sleeve of pullover shirt/dress;Put head through opening of pull over shirt/dress;Pull shirt over trunk Upper body dressing/undressing: 4: Min-Patient completed 75 plus % of tasks FIM - Lower Body Dressing/Undressing Lower body dressing/undressing steps patient completed: Thread/unthread right underwear leg;Thread/unthread left underwear leg;Pull underwear up/down;Thread/unthread right pants leg;Thread/unthread left pants leg;Pull pants up/down Lower body dressing/undressing: 4: Min-Patient completed 75 plus % of tasks  FIM - Toileting Toileting steps completed by patient: Adjust clothing prior to toileting;Performs perineal hygiene;Adjust clothing after toileting Toileting Assistive Devices: Grab bar or rail for support Toileting: 5: Supervision: Safety issues/verbal cues  FIM - Diplomatic Services operational officer Devices: Elevated toilet seat;Walker Toilet Transfers: 5-From toilet/BSC: Supervision (verbal cues/safety issues);5-To toilet/BSC: Supervision (verbal cues/safety issues)  FIM - Banker Devices: Therapist, occupational: 5: Bed > Chair or W/C: Supervision (verbal cues/safety issues);5: Chair or W/C >  Bed: Supervision (verbal cues/safety issues)  FIM - Locomotion: Wheelchair Locomotion: Wheelchair: 1: Travels less than 50 ft with moderate assistance (Pt: 50 - 74%) FIM - Locomotion: Ambulation Locomotion: Ambulation Assistive Devices: Designer, industrial/product Ambulation/Gait Assistance: 4:  Min assist Locomotion: Ambulation: 2: Travels 50 - 149 ft with minimal assistance (Pt.>75%)  Comprehension Comprehension Mode: Auditory Comprehension: 6-Follows complex conversation/direction: With extra time/assistive device  Expression Expression Mode: Verbal Expression: 5-Expresses basic needs/ideas: With no assist  Social Interaction Social Interaction: 6-Interacts appropriately with others with medication or extra time (anti-anxiety, antidepressant).  Problem Solving Problem Solving: 5-Solves basic problems: With no assist  Memory Memory: 5-Recognizes or recalls 90% of the time/requires cueing < 10% of the time  Medical Problem List and Plan:  1. Cervical myelopathy status post ACDF 11/30/2011  2. DVT Prophylaxis/Anticoagulation: SCDs. Monitor for any signs of DVT  3. Pain Management: Neurontin 300 mg 3 times a day, Robaxin and oxycodone as needed. Monitor with increased mobility  -Right knee may be slightly contused, or this may be a prepatellar bursitis  -utilize ice, voltaren gel for knee  4. Mood: Cymbalta 30 mg daily, Ativan 0.5 mg every 8 hours as needed. Provide emotional support and positive reinforcement  5. Neuropsych: This patient is capable of making decisions on his/her own behalf.  6. Coronary artery disease a coronary artery bypass grafting. Aspirin 81 mg daily resumed. Resumed Plavix 75 mg daily in 2 weeks  7. diabetes mellitus. Hemoglobin A1c 7.5  -.Glyburide -resume at 5mg  qday,   -Glucophage 500 mg daily, Januvia 100 mg daily to continue but potentially adjust if sugars remain low 8. Hypertension. Norvasc 10 mg daily. Monitor with increased mobility  9. Hyperlipidemia.  Lipitor  LOS (Days) 5 A FACE TO FACE EVALUATION WAS PERFORMED  Melat Wrisley T 12/11/2011, 8:52 AM

## 2011-12-11 NOTE — Progress Notes (Signed)
Occupational Therapy Session Notes  Patient Details  Name: [name] MRN: [id number] Date of Birth: September 26, 1928  Today's Date: 12/11/2011  Short Term Goals: Week 1:  OT Short Term Goal 1 (Week 1): Patient will perform UB/LB bathing with supervision OT Short Term Goal 2 (Week 1): Patient will perform UB/LB dressing with supervision OT Short Term Goal 3 (Week 1): Patient will perform toilet transfer with supervision OT Short Term Goal 4 (Week 1): Patient will perform tub/shower transfer with supervision  Skilled Therapeutic Interventions/Progress Updates:   Session #1 1100-1200 - 60 Minutes Individual Therapy No complaints of pain Treatment emphasis on ADL retraining at shower level. Focused skilled intervention on functional mobility throughout room using rolling walker, UB/LB bathing at shower level using shower seat in walk-in shower, UB/LB dressing in sit -> stand position, grooming tasks standing at sink, overall activity tolerance/endurance, and functional use of L UE/hand. Gave patient HEP using theraputty to strengthen L hand and increase fine motor control/coordination. At end of session left patient seated in recliner with call bell & phone within reach. Recommend patient stand every 20-30 minutes for pressure relief to sacrum area; RN aware of recommendation.   Session #2 1400-1430 - 30 Minutes Individual Therapy No complaints of pain Patient found seated in recliner. Patient ambulated from room -> therapy gym using rolling walker and with steady assist from therapist. Patient then engaged in tub/shower transfer on/off shower seat seated inside tub/shower with steady assist from therapist. From there patient ambulated -> therapy gym to engage in UE exercise using SCIFIT machine. At end of session left patient seated in recliner and encouraged her to engage in pressure relief every 20-30 minutes. Call bell & phone left within reach.   Precautions:  Precautions Precautions:  Fall Required Braces or Orthoses: Cervical Brace Cervical Brace: Soft collar Restrictions Weight Bearing Restrictions: No  See FIM for current functional status  Barry Faircloth 12/11/2011, 12:08 PM

## 2011-12-12 ENCOUNTER — Inpatient Hospital Stay (HOSPITAL_COMMUNITY): Payer: Medicare Other | Admitting: Occupational Therapy

## 2011-12-12 ENCOUNTER — Inpatient Hospital Stay (HOSPITAL_COMMUNITY): Payer: Medicare Other | Admitting: Physical Therapy

## 2011-12-12 ENCOUNTER — Inpatient Hospital Stay (HOSPITAL_COMMUNITY): Payer: Medicare Other

## 2011-12-12 DIAGNOSIS — W19XXXA Unspecified fall, initial encounter: Secondary | ICD-10-CM

## 2011-12-12 DIAGNOSIS — IMO0002 Reserved for concepts with insufficient information to code with codable children: Secondary | ICD-10-CM

## 2011-12-12 DIAGNOSIS — I2510 Atherosclerotic heart disease of native coronary artery without angina pectoris: Secondary | ICD-10-CM

## 2011-12-12 DIAGNOSIS — M4712 Other spondylosis with myelopathy, cervical region: Secondary | ICD-10-CM

## 2011-12-12 DIAGNOSIS — E1165 Type 2 diabetes mellitus with hyperglycemia: Secondary | ICD-10-CM

## 2011-12-12 DIAGNOSIS — Z5189 Encounter for other specified aftercare: Secondary | ICD-10-CM

## 2011-12-12 LAB — GLUCOSE, CAPILLARY: Glucose-Capillary: 152 mg/dL — ABNORMAL HIGH (ref 70–99)

## 2011-12-12 MED ORDER — GLYBURIDE 5 MG PO TABS
5.0000 mg | ORAL_TABLET | Freq: Two times a day (BID) | ORAL | Status: DC
  Administered 2011-12-12 – 2011-12-14 (×4): 5 mg via ORAL
  Filled 2011-12-12 (×6): qty 1

## 2011-12-12 MED ORDER — GABAPENTIN 400 MG PO CAPS
400.0000 mg | ORAL_CAPSULE | Freq: Three times a day (TID) | ORAL | Status: DC
Start: 2011-12-12 — End: 2011-12-14
  Administered 2011-12-12 – 2011-12-14 (×6): 400 mg via ORAL
  Filled 2011-12-12 (×9): qty 1

## 2011-12-12 MED ORDER — BARRIER CREAM NON-SPECIFIED
1.0000 "application " | TOPICAL_CREAM | Freq: Two times a day (BID) | TOPICAL | Status: DC
  Administered 2011-12-12 – 2011-12-14 (×5): 1 via TOPICAL
  Filled 2011-12-12: qty 1

## 2011-12-12 NOTE — Patient Care Conference (Signed)
Inpatient RehabilitationTeam Conference Note Date: 12/12/2011   Time: 2:00 PM    Patient Name: [name]      Medical Record Number: [id number]  Date of Birth: 08-May-1928 Sex: Female         Room/Bed: 4153/4153-01 Payor Info: Payor: Advertising copywriter MEDICARE  Plan: A[employer] MEDICARE COMPLETE  Product Type: *No Product type*     Admitting Diagnosis: C5-6 ACDF  Admit Date/Time:  12/06/2011  5:47 PM Admission Comments: No comment available   Primary Diagnosis:  Myelopathy Principal Problem: Myelopathy  Patient Active Problem List   Diagnosis Date Noted  . Myelopathy 12/07/2011  . Depression 12/04/2011  . Fever 12/03/2011  . Pain of upper extremity 12/01/2011  . Upper extremity weakness 11/24/2011  . UTI (lower urinary tract infection) 11/18/2011  . Altered mental status 11/18/2011  . Fall 11/18/2011  . Chest pain   . Shortness of breath   . CAD (coronary artery disease)   . Hx of CABG   . Ejection fraction   . Hypertension   . Palpitations   . Dyslipidemia   . DM (diabetes mellitus)   . Carotid artery disease   . Dizziness   . Cervical spine disease   . Hyperkalemia     Expected Discharge Date: Expected Discharge Date: 12/14/11  Team Members Present: Physician: Dr. Faith Rogue Social Worker Present: Dossie Der, LCSW Nurse Present: Other (comment) Beverely Low Hicks-RN) PT Present: Karolee Stamps, PT OT Present: Mackie Pai, OT;Patricia Mat Carne, OT SLP Present: Other (comment) (Leah McCoy-SPT) Other (Discipline and Name): Charolette Child Coordinator     Current Status/Progress Goal Weekly Team Focus  Medical   pain improved as well as upper extremity coordination  pain control  neurontin trial   Bowel/Bladder   Continent of bowel and bladder         Swallow/Nutrition/ Hydration             ADL's   overall supervision ->steady assist  supervision -> mod I (toileting & toilet transfers)  ADL retraining, functional mobility, overall activity tolerance/endurance,  functional use & strengthening of Left UE/hand   Mobility   steady A to close S overall  S overall, mod I basic transfers  functional strengthening and endurance, dynamic standing balance, dynamic gait   Communication             Safety/Cognition/ Behavioral Observations            Pain   Neurtoin 400mg  TID, Oxycodone 5mg  Q4 PRN, Robaxin 500mg  Q6 PRN, Ice packs PRN, KPAD PRN  3 or less  assess and monitor pain. medicate as needed   Skin   steris to right anterior neck CDI, soft collar for comfort, Stage I to sacrum-EPBC applied  no new skin breakdown  assess skin qshift and turnq2 hrs when in bed      *See Interdisciplinary Assessment and Plan and progress notes for long and short-term goals  Barriers to Discharge: safety awareness    Possible Resolutions to Barriers:  family ed, superviision at home    Discharge Planning/Teaching Needs:  Home with family member, can provide supervision level.  Encouraged daughter to come in and attend therapies with pt.      Team Discussion:  Progressing well, need family education-neuropathic pain-trying meds.  Ready to be discharged Thursday  Revisions to Treatment Plan:  NOne   Continued Need for Acute Rehabilitation Level of Care: The patient requires daily medical management by a physician with specialized training in physical medicine and rehabilitation for  the following conditions: Daily direction of a multidisciplinary physical rehabilitation program to ensure safe treatment while eliciting the highest outcome that is of practical value to the patient.: Yes Daily medical management of patient stability for increased activity during participation in an intensive rehabilitation regime.: Yes Daily analysis of laboratory values and/or radiology reports with any subsequent need for medication adjustment of medical intervention for : Post surgical problems;Neurological problems;Other  [name], [name] 12/12/2011, 2:26 PM

## 2011-12-12 NOTE — Progress Notes (Signed)
Social Work Patient ID: [name], female   DOB: 10/13/1928, 78 y.o.   MRN: [id number] Met with pt and spoke with [name] via telephone to inform of team conference supervision/mod/i level goals and discharge 10/3. Scheduled family education for Wed 1:00-4:00pm.  Daughter reports they need a list of agencies due to they all work.  Will leave agency list For daughter to pursue caregiver's during the day.  See tomorrow to work on discharge needs and follow up.

## 2011-12-12 NOTE — Progress Notes (Signed)
Physical Therapy Session Note  Patient Details  Name: Rhea Kaelin MRN: [id number] Date of Birth: 09-06-1928  Today's Date: 12/12/2011 Time: 0930-1026 Time Calculation (min): 56 min  Short Term Goals: Week 1:  PT Short Term Goal 1 (Week 1): = LTGs  Skilled Therapeutic Interventions/Progress Updates:   Discussed d/c planning and home environment mobility, pt reports feeling confident with going home and daughter will stay with pt a few days. Gait in room with RW and toileting tasks with overall S to mod I. Gait with RW for functional mobility training to/from therapy with S, intermittent cueing for posture, R foot still ER. Berg Balance test administered and results/recommendations for use of RW discussed with pt who verbalized understanding. Patient demonstrates increased fall risk as noted by score of   31/56 on Berg Balance Scale.  (<36= high risk for falls, close to 100%; 37-45 significant >80%; 46-51 moderate >50%; 52-55 lower >25%). Practiced floor transfer with mod A and discussed what to do in case of emergency. Gait trials without AD to challenge balance, requiring min A overall with LOB and RLE crossing midline.     Therapy Documentation Precautions:  Precautions Precautions: Fall Required Braces or Orthoses: Cervical Brace Cervical Brace: Soft collar Restrictions Weight Bearing Restrictions: No   Pain: C/o neck and knee pain - premedicated. States today is tough day for her.     Balance: Standardized Balance Assessment Standardized Balance Assessment: Berg Balance Test Berg Balance Test Sit to Stand: Able to stand without using hands and stabilize independently Standing Unsupported: Able to stand safely 2 minutes Sitting with Back Unsupported but Feet Supported on Floor or Stool: Able to sit safely and securely 2 minutes Stand to Sit: Sits safely with minimal use of hands Transfers: Able to transfer safely, minor use of hands Standing Unsupported with Eyes Closed: Able  to stand 10 seconds with supervision Standing Ubsupported with Feet Together: Able to place feet together independently but unable to hold for 30 seconds From Standing, Reach Forward with Outstretched Arm: Reaches forward but needs supervision From Standing Position, Pick up Object from Floor: Unable to try/needs assist to keep balance (needs min A to pick up shoe) From Standing Position, Turn to Look Behind Over each Shoulder: Turn sideways only but maintains balance Turn 360 Degrees: Able to turn 360 degrees safely but slowly Standing Unsupported, Alternately Place Feet on Step/Stool: Able to complete >2 steps/needs minimal assist Standing Unsupported, One Foot in Front: Loses balance while stepping or standing Standing on One Leg: Unable to try or needs assist to prevent fall Total Score: 31   See FIM for current functional status  Therapy/Group: Individual Therapy  Karolee Stamps [facility] 12/12/2011, 10:03 AM

## 2011-12-12 NOTE — Progress Notes (Signed)
Occupational Therapy Session Note  Patient Details  Name: Imagean Lagorio MRN: [id number] Date of Birth: Oct 09, 1928  Today's Date: 12/12/2011 Time: 4098-1191 Time Calculation (min): 55 min  Short Term Goals: Week 1:  OT Short Term Goal 1 (Week 1): Patient will perform UB/LB bathing with supervision OT Short Term Goal 2 (Week 1): Patient will perform UB/LB dressing with supervision OT Short Term Goal 3 (Week 1): Patient will perform toilet transfer with supervision OT Short Term Goal 4 (Week 1): Patient will perform tub/shower transfer with supervision  Skilled Therapeutic Interventions/Progress Updates:  Patient found seated in recliner upon entering room. Treatment focus on ADL retraining at shower level, shower stall transfer, functional ambulation throughout room/bathroom using rolling walker, overall activity tolerance/endurance, and grooming tasks standing at sink. Also discussed d/c planning and overall goals with patient. Goals downgraded to overall supervision & min assist for UB dressing (secondary to hooking of bra), except mod I for toileting and toilet transfers. At end of session left patient seated in recliner with call bell & phone within reach.   Precautions:  Precautions Precautions: Fall Required Braces or Orthoses: Cervical Brace Cervical Brace: Soft collar Restrictions Weight Bearing Restrictions: No  See FIM for current functional status  Therapy/Group: Individual Therapy  Theta Leaf 12/12/2011, 9:31 AM

## 2011-12-12 NOTE — Progress Notes (Addendum)
Physical Therapy Session Note  Patient Details  Name: Billijo Yerke MRN: [id number] Date of Birth: 1928/12/22  Today's Date: 12/12/2011 Time: 1530-1610 Time Calculation (min): 40 min  Short Term Goals: Week 1:  PT Short Term Goal 1 (Week 1): = LTGs  Functional mobility in pt room to complete toileting tasks and transfers with S overall. Pt excited to go home on Thursday. Gait to/from therapy with S for general endurance and strengthening. Stair training for home entry with S up/down 10 steps. Dynamic standing balance activity and coordination to play Wii [employer]. Completed balance activity with S and rest breaks sitting as needed.     Therapy Documentation Precautions:  Precautions Precautions: Fall;Cervical Precaution Comments: Pt with hypersensitivity to touch (pain) left arm from elbow distally (better than it wa prior to ACDF, but still an issue) Required Braces or Orthoses: Cervical Brace Cervical Brace: Soft collar Restrictions Weight Bearing Restrictions: No   Pain: No complaints.  See FIM for current functional status  Therapy/Group: Individual Therapy  Karolee Stamps [facility] 12/12/2011, 4:14 PM

## 2011-12-12 NOTE — Progress Notes (Signed)
Physical Therapy Session Note  Patient Details  Name: [name] MRN: [id number] Date of Birth: Oct 29, 1928  Today's Date: 12/12/2011 Time: 1131-1204 Time Calculation (min): 33 min  Short Term Goals: Week 1:  PT Short Term Goal 1 (Week 1): = LTGs  Skilled Therapeutic Interventions/Progress Updates:    This treatment session focused on gait both with and without RW, exercises in the hallway working on gait/balance, and NuStep for endurance and strength training.  We also practiced the stairs again with one rail (L going up).  See details below.    Therapy Documentation Precautions:  Precautions Precautions: Fall;Cervical Precaution Comments: Pt with hypersensitivity to touch (pain) left arm from elbow distally (better than it wa prior to ACDF, but still an issue) Required Braces or Orthoses: Cervical Brace Cervical Brace: Soft collar Restrictions Weight Bearing Restrictions: No Pain: Pain Assessment Pain Assessment: 0-10 Pain Score:   8 Pain Type: Acute pain Pain Location: Neck Pain Orientation: Posterior Pain Descriptors: Aching;Discomfort Pain Intervention(s): Repositioned;Ambulation/increased activity (RN pre-medicated) Mobility: Transfers Sit to Stand: 5: Supervision;With upper extremity assist;From chair/3-in-1 Stand to Sit: 5: Supervision;Without upper extremity assist;To chair/3-in-1 Locomotion : Ambulation Ambulation/Gait Assistance: 5: Supervision;4: Min assist Ambulation Distance (Feet): 300 Feet Assistive device: Rolling walker;None Ambulation/Gait Assistance Details: supervision with RW, min assist with no assistive device, mild scissoring with gait without RW, upright posture better without. Increased LOB while turning corners and mild posterior lean.   Stairs / Management consultant Assistance: 5: Supervision Stair Management Technique: One rail Left;Alternating pattern;Step to pattern (alternating up, step to down) Number of Stairs: 10     Balance: Standardized Balance Assessment Standardized Balance Assessment: Berg Balance Test Berg Balance Test Sit to Stand: Able to stand without using hands and stabilize independently Standing Unsupported: Able to stand safely 2 minutes Sitting with Back Unsupported but Feet Supported on Floor or Stool: Able to sit safely and securely 2 minutes Stand to Sit: Sits safely with minimal use of hands Transfers: Able to transfer safely, minor use of hands Standing Unsupported with Eyes Closed: Able to stand 10 seconds with supervision Standing Ubsupported with Feet Together: Able to place feet together independently but unable to hold for 30 seconds From Standing, Reach Forward with Outstretched Arm: Reaches forward but needs supervision From Standing Position, Pick up Object from Floor: Unable to try/needs assist to keep balance (needs min A to pick up shoe) From Standing Position, Turn to Look Behind Over each Shoulder: Turn sideways only but maintains balance Turn 360 Degrees: Able to turn 360 degrees safely but slowly Standing Unsupported, Alternately Place Feet on Step/Stool: Able to complete >2 steps/needs minimal assist Standing Unsupported, One Foot in Front: Loses balance while stepping or standing Standing on One Leg: Unable to try or needs assist to prevent fall Total Score: 31  Exercises: Other Exercises Other Exercises: NuStep level 5 legs only 5 mins for strength and endurance training of the legs. Other Treatments: Treatments Therapeutic Activity: in hallway with railing, marches, side steps, retro gait, tandem gait all 25' x 2 for balance and endurance training.    See FIM for current functional status  Therapy/Group: Individual Therapy  Quintyn Dombek, Claretta Fraise 12/12/2011, 12:12 PM

## 2011-12-12 NOTE — Progress Notes (Signed)
Subjective/Complaints: Complains of burning arm pain and buttock pain. A 12 point review of systems has been performed and if not noted above is otherwise negative.   Objective: Vital Signs: Blood pressure 107/65, pulse 66, temperature 99 F (37.2 C), temperature source Oral, resp. rate 19, height 5\' 7"  (1.702 m), SpO2 93.00%. No results found. No results found for this basename: WBC:2,HGB:2,HCT:2,PLT:2 in the last 72 hours No results found for this basename: NA:2,K:2,CL:2,CO2:2,GLUCOSE:2,BUN:2,CREATININE:2,CALCIUM:2 in the last 72 hours CBG (last 3)   Basename 12/12/11 0723 12/11/11 2105 12/11/11 1645  GLUCAP 103* 205* 150*    Wt Readings from Last 3 Encounters:  11/29/11 64.3 kg (141 lb 12.1 oz)  11/29/11 64.3 kg (141 lb 12.1 oz)  07/12/11 66.225 kg (146 lb)    Physical Exam:  HENT:  Head: Normocephalic.  Eyes:  Pupils round and reactive to light  Neck:  Soft cervical collar in place  Cardiovascular: Normal rate and regular rhythm.  Pulmonary/Chest: Breath sounds normal. She has no wheezes.  Abdominal: Bowel sounds are normal. She exhibits no distension. There is no tenderness.  Musculoskeletal: She exhibits no edema. Tender around surgical site and bilateral traps. Right knee tender at the patella, mild swelling. No bruise Neurological: She is alert.  Patient is oriented to name, year and place. He had some difficulty recalling full of dense related to her fall. She follows two-step commands  Psychiatric: She has a flat affect.  Ataxia FNF in BUE  Motor 3/5 in B Delt, 3+ bi,tri, grip 3,  3-4 B HF,KE,ADF with some pain noted at right knee especially with weight bearing Sensory mildly reduced Lt touch in hands left more than right. Minimal sensory loss in the legs Skin: reddened area around sacrum and gluteal folds. A few areas of superficial breakdown.  Assessment/Plan: 1. Functional deficits secondary to cervical myelopathy/central cord C5-6 which require 3+ hours per  day of interdisciplinary therapy in a comprehensive inpatient rehab setting. Physiatrist is providing close team supervision and 24 hour management of active medical problems listed below. Physiatrist and rehab team continue to assess barriers to discharge/monitor patient progress toward functional and medical goals. FIM: FIM - Bathing Bathing Steps Patient Completed: Chest;Right Arm;Left Arm;Abdomen;Front perineal area;Right upper leg;Buttocks;Left upper leg;Right lower leg (including foot);Left lower leg (including foot) Bathing: 5: Supervision: Safety issues/verbal cues  FIM - Upper Body Dressing/Undressing Upper body dressing/undressing steps patient completed: Thread/unthread right bra strap;Thread/unthread left bra strap;Thread/unthread right sleeve of pullover shirt/dresss;Thread/unthread left sleeve of pullover shirt/dress;Put head through opening of pull over shirt/dress;Pull shirt over trunk Upper body dressing/undressing: 4: Min-Patient completed 75 plus % of tasks FIM - Lower Body Dressing/Undressing Lower body dressing/undressing steps patient completed: Thread/unthread right underwear leg;Thread/unthread left underwear leg;Pull underwear up/down;Thread/unthread right pants leg;Thread/unthread left pants leg;Pull pants up/down;Don/Doff right sock;Don/Doff left sock Lower body dressing/undressing: 5: Supervision: Safety issues/verbal cues  FIM - Toileting Toileting steps completed by patient: Adjust clothing prior to toileting;Performs perineal hygiene;Adjust clothing after toileting Toileting Assistive Devices: Grab bar or rail for support Toileting: 4: Steadying assist  FIM - Diplomatic Services operational officer Devices: Elevated toilet Photographer Transfers: 0-Activity did not occur  FIM - Banker Devices: Therapist, occupational: 6: Supine > Sit: No assist;6: Sit > Supine: No assist;5: Bed > Chair or W/C: Supervision  (verbal cues/safety issues);4: Chair or W/C > Bed: Min A (steadying Pt. > 75%)  FIM - Locomotion: Wheelchair Locomotion: Wheelchair: 1: Total Assistance/staff pushes wheelchair (Pt<25%) FIM - Locomotion: Ambulation Locomotion: Ambulation  Assistive Devices: Designer, industrial/product Ambulation/Gait Assistance: 4: Min guard Locomotion: Ambulation: 4: Travels 150 ft or more with minimal assistance (Pt.>75%)  Comprehension Comprehension Mode: Auditory Comprehension: 5-Understands complex 90% of the time/Cues < 10% of the time  Expression Expression Mode: Verbal Expression: 6-Expresses complex ideas: With extra time/assistive device  Social Interaction Social Interaction: 6-Interacts appropriately with others with medication or extra time (anti-anxiety, antidepressant).  Problem Solving Problem Solving: 6-Solves complex problems: With extra time  Memory Memory: 5-Recognizes or recalls 90% of the time/requires cueing < 10% of the time  Medical Problem List and Plan:  1. Cervical myelopathy status post ACDF 11/30/2011  2. DVT Prophylaxis/Anticoagulation: SCDs. Monitor for any signs of DVT  3. Pain Management: Neurontin 300 mg 3 times a day, Robaxin and oxycodone as needed. Monitor with increased mobility. Will further titrate neurontin for arm pain -Right knee may be slightly contused, or this may be a prepatellar bursitis  -utilize ice, voltaren gel for knee  4. Mood: Cymbalta 30 mg daily, Ativan 0.5 mg every 8 hours as needed. Provide emotional support and positive reinforcement  5. Neuropsych: This patient is capable of making decisions on his/her own behalf.  6. Coronary artery disease a coronary artery bypass grafting. Aspirin 81 mg daily resumed. Resumed Plavix 75 mg daily in 2 weeks  7. diabetes mellitus. Hemoglobin A1c 7.5  -.Glyburide -resumed at 5mg  qday yestrday. Will increase to bid   -Glucophage 500 mg daily, Januvia 100 mg daily to continue but potentially adjust if sugars remain  low 8. Hypertension. Norvasc 10 mg daily. Monitor with increased mobility  9. Hyperlipidemia. Lipitor 10. Sacral wound- stage 2. EPBC should be fine for this as well as pressure relief which has been reviewed with the patient  LOS (Days) 6 A FACE TO FACE EVALUATION WAS PERFORMED  [name] T 12/12/2011, 8:11 AM

## 2011-12-13 ENCOUNTER — Inpatient Hospital Stay (HOSPITAL_COMMUNITY): Payer: Medicare Other

## 2011-12-13 ENCOUNTER — Inpatient Hospital Stay (HOSPITAL_COMMUNITY): Payer: Medicare Other | Admitting: Occupational Therapy

## 2011-12-13 ENCOUNTER — Inpatient Hospital Stay (HOSPITAL_COMMUNITY): Payer: Medicare Other | Admitting: Physical Therapy

## 2011-12-13 LAB — GLUCOSE, CAPILLARY
Glucose-Capillary: 76 mg/dL (ref 70–99)
Glucose-Capillary: 158 mg/dL — ABNORMAL HIGH (ref 70–99)
Glucose-Capillary: 218 mg/dL — ABNORMAL HIGH (ref 70–99)
Glucose-Capillary: 202 mg/dL — ABNORMAL HIGH (ref 70–99)

## 2011-12-13 NOTE — Discharge Summary (Signed)
  Discharge summary job # [id number]

## 2011-12-13 NOTE — Progress Notes (Signed)
Occupational Therapy Session Notes & Discharge Summary  Patient Details  Name: [name] MRN: [id number] Date of Birth: 11/01/1928  Today's Date: 12/13/2011  SESSION NOTES  Session #1 4098-1191 - 45 Minutes Individual Therapy No complaints of pain Treatment focus on ADL retraining at shower level. Patients daughter present during entire session for education regarding BADLs. Patient performed at an overall modified independent -> supervision level except patient required min assist for UB dressing to hook bra. At end of session made patient mod I in room.   Session #2 1300-1400 - 60 Minutes Individual Therapy No complaints of pain Treatment focus on family education regarding bed mobility, tub/shower transfer using tub transfer bench, toilet transfers, toileting, and UE HEP. Patient's daughter present during entire session for education. Encouraged energy conservation during tasks prn.   ------------------------------------------------------------------------------------------------------------  DISCHARGE SUMMARY Patient has met 10 of 10 long term goals due to improved activity tolerance, improved balance, ability to compensate for deficits, functional use of  RIGHT upper and LEFT upper extremity, improved attention, improved awareness and improved coordination.  Patient to discharge at overall supervision -> modified independent level.  Patient's care partner is independent to provide the necessary supervision prn assistance at discharge.    Reasons goals not met: n/a; all goals met at this time  Recommendation:  Patient will benefit from ongoing skilled OT services in home health setting to continue to advance functional skills in the area of BADL, iADL and Reduce care partner burden.  Equipment: tub transfer bench and BSC  Reasons for discharge: treatment goals met and discharge from hospital  Patient/family agrees with progress made and goals achieved:  Yes  Precautions/Restrictions  Precautions Precautions: Fall;Cervical Required Braces or Orthoses: Cervical Brace Cervical Brace: Soft collar (prn) Restrictions Weight Bearing Restrictions: No  Pain Pain Assessment Pain Assessment: No/denies pain Pain Score: 0-No pain  ADL - See FIM  Vision/Perception  Vision - History Baseline Vision: Wears glasses only for reading Patient Visual Report: No change from baseline Vision - Assessment Eye Alignment: Within Functional Limits Perception Perception: Within Functional Limits Praxis Praxis: Intact   Cognition Overall Cognitive Status: Appears within functional limits for tasks assessed Arousal/Alertness: Awake/alert Orientation Level: Oriented X4 Memory: Impaired (noteable memory loss during some functional tasks) Awareness: Appears intact Problem Solving: Appears intact Safety/Judgment: Appears intact  Sensation Sensation Additional Comments: Bilateral UEs appear intact and have improved since admission Coordination Gross Motor Movements are Fluid and Coordinated: Yes Fine Motor Movements are Fluid and Coordinated: Yes  Motor - See Discharge Navigator  Mobility - See Discharge Navigator  Trunk/Postural Assessment - See Discharge Navigator  Balance- See Discharge Navigator  Extremity/Trunk Assessment RUE Assessment RUE Assessment: Within Functional Limits LUE Assessment LUE Assessment: Within Functional Limits  See FIM for current functional status  [name] 12/13/2011, 2:44 PM

## 2011-12-13 NOTE — Progress Notes (Signed)
Physical Therapy Discharge Summary  Patient Details  Name: [name] MRN: [id number] Date of Birth: 11/01/1928  Today's Date: 12/13/2011 Time: 5621-3086 Time Calculation (min): 43 min Individual therapy. Dynamic gait through obstacle course with RW with overall S to simulate home environment on compliant surface, navigating cones, and stepping up/down curb with RW. Nustep on level 4 x 10 min (UEs intermittently due to increased shoulder discomfort). Bed mobility and functional mobility in ADL apartment with RW, mod I with bed mobility on regular bed. Pt reports she feels prepared for d/c.   Patient has met 7 of 7 long term goals due to improved activity tolerance, improved balance, decreased pain and improved coordination.  Patient to discharge at an ambulatory level Supervision.   Patient's care partner is independent to provide the necessary supervision assistance at discharge.  Reasons goals not met: n/a. All goals met  Recommendation:  Patient will benefit from ongoing skilled PT services in home health setting to continue to advance safe functional mobility, address ongoing impairments in gait, balance, functional strength and endurance, and minimize fall risk.  Equipment: RW  Reasons for discharge: treatment goals met and discharge from hospital  Patient/family agrees with progress made and goals achieved: Yes  PT Discharge Precautions/Restrictions Precautions Precautions: Fall;Cervical Required Braces or Orthoses: Cervical Brace Cervical Brace: Soft collar (prn) Restrictions Weight Bearing Restrictions: No   Pain Pain Assessment Pain Assessment: No/denies pain Pain Score: 0-No pain Vision/Perception  Vision - History Baseline Vision: Wears glasses only for reading Patient Visual Report: No change from baseline Vision - Assessment Eye Alignment: Within Functional Limits Perception Perception: Within Functional Limits Praxis Praxis: Intact  Cognition Overall  Cognitive Status: Appears within functional limits for tasks assessed Arousal/Alertness: Awake/alert Orientation Level: Oriented X4 Memory: Impaired (noteable memory loss during some functional tasks) Awareness: Appears intact Problem Solving: Appears intact Safety/Judgment: Appears intact Sensation Sensation Light Touch:  (LE's intact) Additional Comments: Bilateral UEs appear intact and have improved since admission Coordination Gross Motor Movements are Fluid and Coordinated: Yes Fine Motor Movements are Fluid and Coordinated: Yes Motor  Motor Motor: Within Functional Limits     Trunk/Postural Assessment  Cervical Assessment Cervical Assessment: Exceptions to [facility] (soft collar for comfort) Thoracic Assessment Thoracic Assessment:  (same as admission) Lumbar Assessment Lumbar Assessment: Within Functional Limits  Balance Static Sitting Balance Static Sitting - Level of Assistance: 6: Modified independent (Device/Increase time) Dynamic Sitting Balance Dynamic Sitting - Level of Assistance: 6: Modified independent (Device/Increase time) Static Standing Balance Static Standing - Level of Assistance: 6: Modified independent (Device/Increase time) (with RW) Dynamic Standing Balance Dynamic Standing - Level of Assistance: 6: Modified independent (Device/Increase time) (with RW) Extremity Assessment  RUE Assessment RUE Assessment: Within Functional Limits LUE Assessment LUE Assessment: Within Functional Limits RLE Assessment RLE Assessment:  (grossly 4/5) LLE Assessment LLE Assessment:  (grossly 4/5)  See FIM for current functional status  Karolee Stamps [facility] 12/13/2011, 3:18 PM

## 2011-12-13 NOTE — Progress Notes (Signed)
  Social Work Patient ID: [name], female   DOB: 07/08/1928, 78 y.o.   MRN: [id number] Family here to do family education with pt.  Reports it went well and pt is ready for discharge tomorrow. Discussed DME and follow up and will make arrangements for tomorrow.

## 2011-12-13 NOTE — Progress Notes (Signed)
Subjective/Complaints: Neck and arm pain present but a little better. Excited to go home. A 12 point review of systems has been performed and if not noted above is otherwise negative.   Objective: Vital Signs: Blood pressure 106/63, pulse 64, temperature 98.3 F (36.8 C), temperature source Oral, resp. rate 19, height 5\' 7"  (1.702 m), SpO2 94.00%. No results found. No results found for this basename: WBC:2,HGB:2,HCT:2,PLT:2 in the last 72 hours No results found for this basename: NA:2,K:2,CL:2,CO2:2,GLUCOSE:2,BUN:2,CREATININE:2,CALCIUM:2 in the last 72 hours CBG (last 3)   Basename 12/13/11 0717 12/12/11 2132 12/12/11 1636  GLUCAP 76 206* 264*    Wt Readings from Last 3 Encounters:  11/29/11 64.3 kg (141 lb 12.1 oz)  11/29/11 64.3 kg (141 lb 12.1 oz)  07/12/11 66.225 kg (146 lb)    Physical Exam:  HENT:  Head: Normocephalic.  Eyes:  Pupils round and reactive to light  Neck:  Soft cervical collar in place  Cardiovascular: Normal rate and regular rhythm.  Pulmonary/Chest: Breath sounds normal. She has no wheezes.  Abdominal: Bowel sounds are normal. She exhibits no distension. There is no tenderness.  Musculoskeletal: She exhibits no edema. Tender around surgical site and bilateral traps. Right knee tender at the patella, mild swelling. No bruise Neurological: She is alert.  Patient is oriented to name, year and place. He had some difficulty recalling full of dense related to her fall. She follows two-step commands  Psychiatric: She has a flat affect.  Ataxia FNF in BUE  Motor 3/5 in B Delt, 3+ bi,tri, grip 3,  3-4 B HF,KE,ADF with some pain noted at right knee especially with weight bearing Sensory mildly reduced Lt touch in hands left more than right. Minimal sensory loss in the legs Skin: reddened area around sacrum and gluteal folds. A few areas of superficial breakdown.  Assessment/Plan: 1. Functional deficits secondary to cervical myelopathy/central cord C5-6 which  require 3+ hours per day of interdisciplinary therapy in a comprehensive inpatient rehab setting. Physiatrist is providing close team supervision and 24 hour management of active medical problems listed below. Physiatrist and rehab team continue to assess barriers to discharge/monitor patient progress toward functional and medical goals. FIM: FIM - Bathing Bathing Steps Patient Completed: Chest;Left Arm;Right Arm;Abdomen;Front perineal area;Buttocks;Right upper leg;Left upper leg;Right lower leg (including foot);Left lower leg (including foot) Bathing: 5: Supervision: Safety issues/verbal cues  FIM - Upper Body Dressing/Undressing Upper body dressing/undressing steps patient completed: Thread/unthread right bra strap;Thread/unthread left bra strap;Thread/unthread right sleeve of pullover shirt/dresss;Pull shirt over trunk;Put head through opening of pull over shirt/dress;Thread/unthread left sleeve of pullover shirt/dress Upper body dressing/undressing: 4: Min-Patient completed 75 plus % of tasks FIM - Lower Body Dressing/Undressing Lower body dressing/undressing steps patient completed: Thread/unthread right underwear leg;Thread/unthread left underwear leg;Pull underwear up/down;Thread/unthread right pants leg;Thread/unthread left pants leg;Pull pants up/down;Don/Doff right sock;Don/Doff left sock Lower body dressing/undressing: 5: Supervision: Safety issues/verbal cues  FIM - Toileting Toileting steps completed by patient: Adjust clothing prior to toileting;Performs perineal hygiene;Adjust clothing after toileting Toileting Assistive Devices: Grab bar or rail for support Toileting: 5: Supervision: Safety issues/verbal cues  FIM - Diplomatic Services operational officer Devices: Elevated toilet seat;Walker Toilet Transfers: 5-To toilet/BSC: Supervision (verbal cues/safety issues);5-From toilet/BSC: Supervision (verbal cues/safety issues)  FIM - Landscape architect Devices: Therapist, occupational: 6: Supine > Sit: No assist;6: Sit > Supine: No assist;5: Bed > Chair or W/C: Supervision (verbal cues/safety issues);4: Chair or W/C > Bed: Min A (steadying Pt. > 75%)  FIM - Locomotion: Wheelchair  Locomotion: Wheelchair: 0: Activity did not occur FIM - Locomotion: Ambulation Locomotion: Ambulation Assistive Devices: Designer, industrial/product Ambulation/Gait Assistance: 5: Supervision;4: Min assist Locomotion: Ambulation: 4: Travels 150 ft or more with minimal assistance (Pt.>75%) (min assist without RW, supervision with RW)  Comprehension Comprehension Mode: Auditory Comprehension: 6-Follows complex conversation/direction: With extra time/assistive device  Expression Expression Mode: Verbal Expression: 6-Expresses complex ideas: With extra time/assistive device  Social Interaction Social Interaction: 6-Interacts appropriately with others with medication or extra time (anti-anxiety, antidepressant).  Problem Solving Problem Solving: 6-Solves complex problems: With extra time  Memory Memory: 5-Recognizes or recalls 90% of the time/requires cueing < 10% of the time  Medical Problem List and Plan:  1. Cervical myelopathy status post ACDF 11/30/2011  2. DVT Prophylaxis/Anticoagulation: SCDs. Monitor for any signs of DVT  3. Pain Management: Neurontin 300 mg 3 times a day, Robaxin and oxycodone as needed. Monitor with increased mobility. Will further titrate neurontin for arm pain -Right knee may be slightly contused, or this may be a prepatellar bursitis  -utilize ice, voltaren gel for knee. Can try voltaren for neck also 4. Mood: Cymbalta 30 mg daily, Ativan 0.5 mg every 8 hours as needed. Provide emotional support and positive reinforcement  5. Neuropsych: This patient is capable of making decisions on his/her own behalf.  6. Coronary artery disease a coronary artery bypass grafting. Aspirin 81 mg daily resumed. Resumed Plavix 75 mg daily in 2  weeks  7. diabetes mellitus. Hemoglobin A1c 7.5  -.Glyburide -increased to bid   -Glucophage 500 mg daily, Januvia 100 mg daily to continue but potentially adjust if sugars remain low 8. Hypertension. Norvasc 10 mg daily--hold today. Monitor with increased mobility  9. Hyperlipidemia. Lipitor 10. Sacral wound- stage 2. EPBC should be fine for this as well as pressure relief which has been reviewed with the patient  LOS (Days) 7 A FACE TO FACE EVALUATION WAS PERFORMED  Chibueze Beasley T 12/13/2011, 8:34 AM

## 2011-12-13 NOTE — Progress Notes (Addendum)
Physical Therapy Note  Patient Details  Name: Kennidy Lamke MRN: [id number] Date of Birth: Nov 14, 1928 Today's Date: 12/13/2011  7829-5621 (45 minutes) individual Pain: no complaint of pain Focus of treatment: Caregiver education Treatment : Daughter Aram Beecham) was instructed in and redemonstrated safe guarding with the following; wc setup, car transfer with RW SBA; Up/ down 4 steps with rail left SBA ; gait- pt ambulated 150 feet with RW SBA; daughter inquired about using rollator for ambulation- pt unable to safely use rollator secondary to required max vcs for locking and unlocking brakes prior to sit <> stand. Daughter agreed.   Narcissus Detwiler,JIM 12/13/2011, 8:13 AM

## 2011-12-14 ENCOUNTER — Encounter (HOSPITAL_COMMUNITY): Payer: Medicare Other | Admitting: Occupational Therapy

## 2011-12-14 ENCOUNTER — Inpatient Hospital Stay (HOSPITAL_COMMUNITY): Payer: Medicare Other | Admitting: Occupational Therapy

## 2011-12-14 ENCOUNTER — Ambulatory Visit: Payer: Medicare Other | Admitting: Cardiology

## 2011-12-14 ENCOUNTER — Inpatient Hospital Stay (HOSPITAL_COMMUNITY): Payer: Medicare Other

## 2011-12-14 LAB — GLUCOSE, CAPILLARY
Glucose-Capillary: 65 mg/dL — ABNORMAL LOW (ref 70–99)
Glucose-Capillary: 94 mg/dL (ref 70–99)

## 2011-12-14 MED ORDER — OXYCODONE HCL 5 MG PO TABS: 5.0000 mg | ORAL_TABLET | ORAL | Status: DC | PRN

## 2011-12-14 MED ORDER — LINAGLIPTIN 5 MG PO TABS: 5.0000 mg | ORAL_TABLET | Freq: Every day | ORAL | Status: DC

## 2011-12-14 MED ORDER — METHOCARBAMOL 500 MG PO TABS: 500.0000 mg | ORAL_TABLET | Freq: Four times a day (QID) | ORAL | Status: DC | PRN

## 2011-12-14 MED ORDER — DICLOFENAC SODIUM 1 % TD GEL: 1.0000 "application " | Freq: Four times a day (QID) | TRANSDERMAL | Status: DC

## 2011-12-14 MED ORDER — GABAPENTIN 400 MG PO CAPS: 400.0000 mg | ORAL_CAPSULE | Freq: Three times a day (TID) | ORAL | Status: DC

## 2011-12-14 MED ORDER — LORAZEPAM 0.5 MG PO TABS: 0.5000 mg | ORAL_TABLET | Freq: Two times a day (BID) | ORAL | Status: DC

## 2011-12-14 NOTE — Progress Notes (Signed)
Subjective/Complaints: Anxious to go home.  A 12 point review of systems has been performed and if not noted above is otherwise negative.   Objective: Vital Signs: Blood pressure 119/64, pulse 59, temperature 98.2 F (36.8 C), temperature source Oral, resp. rate 17, height 5\' 7"  (1.702 m), weight 65.3 kg (143 lb 15.4 oz), SpO2 95.00%. No results found. No results found for this basename: WBC:2,HGB:2,HCT:2,PLT:2 in the last 72 hours No results found for this basename: NA:2,K:2,CL:2,CO2:2,GLUCOSE:2,BUN:2,CREATININE:2,CALCIUM:2 in the last 72 hours CBG (last 3)   Basename 12/13/11 2112 12/13/11 1639 12/13/11 1145  GLUCAP 202* 218* 158*    Wt Readings from Last 3 Encounters:  12/13/11 65.3 kg (143 lb 15.4 oz)  11/29/11 64.3 kg (141 lb 12.1 oz)  11/29/11 64.3 kg (141 lb 12.1 oz)    Physical Exam:  HENT:  Head: Normocephalic.  Eyes:  Pupils round and reactive to light  Neck:  Soft cervical collar in place  Cardiovascular: Normal rate and regular rhythm.  Pulmonary/Chest: Breath sounds normal. She has no wheezes.  Abdominal: Bowel sounds are normal. She exhibits no distension. There is no tenderness.  Musculoskeletal: She exhibits no edema. Tender around surgical site and bilateral traps. Right knee tender at the patella, mild swelling. No bruise Neurological: She is alert.  Patient is oriented to name, year and place. He had some difficulty recalling full of dense related to her fall. She follows two-step commands  Psychiatric: She has a flat affect.  Ataxia FNF in BUE  Motor 3/5 in B Delt, 3+ bi,tri, grip 3,  3-4 B HF,KE,ADF with some pain noted at right knee especially with weight bearing Sensory mildly reduced Lt touch in hands left more than right. Minimal sensory loss in the legs Skin: reddened area around sacrum and gluteal folds. A few areas of superficial breakdown.  Assessment/Plan: 1. Functional deficits secondary to cervical myelopathy/central cord C5-6 which require  3+ hours per day of interdisciplinary therapy in a comprehensive inpatient rehab setting. Physiatrist is providing close team supervision and 24 hour management of active medical problems listed below. Physiatrist and rehab team continue to assess barriers to discharge/monitor patient progress toward functional and medical goals.  Dc home today Follow up with me in about a month  FIM: FIM - Bathing Bathing Steps Patient Completed: Chest;Right Arm;Left Arm;Abdomen;Front perineal area;Buttocks;Right upper leg;Left upper leg;Right lower leg (including foot);Left lower leg (including foot) Bathing: 6: Assistive device (Comment)  FIM - Upper Body Dressing/Undressing Upper body dressing/undressing steps patient completed: Thread/unthread right bra strap;Thread/unthread left bra strap;Thread/unthread right sleeve of pullover shirt/dresss;Thread/unthread left sleeve of pullover shirt/dress;Put head through opening of pull over shirt/dress;Pull shirt over trunk Upper body dressing/undressing: 4: Min-Patient completed 75 plus % of tasks FIM - Lower Body Dressing/Undressing Lower body dressing/undressing steps patient completed: Thread/unthread right underwear leg;Thread/unthread left underwear leg;Pull underwear up/down;Thread/unthread right pants leg;Thread/unthread left pants leg;Pull pants up/down;Don/Doff right sock;Don/Doff left sock Lower body dressing/undressing: 5: Supervision: Safety issues/verbal cues  FIM - Toileting Toileting steps completed by patient: Adjust clothing prior to toileting;Performs perineal hygiene;Adjust clothing after toileting Toileting Assistive Devices: Grab bar or rail for support Toileting: 6: Assistive device: No helper  FIM - Diplomatic Services operational officer Devices: Elevated toilet seat;Walker;Grab bars Toilet Transfers: 6-Assistive device: No helper  FIM - Banker Devices: Therapist, occupational: 6: Supine  > Sit: No assist;6: Sit > Supine: No assist;6: Bed > Chair or W/C: No assist;6: Chair or W/C > Bed: No assist  FIM - Locomotion:  Wheelchair Locomotion: Wheelchair: 0: Activity did not occur (gait to/from therapy) FIM - Locomotion: Ambulation Locomotion: Ambulation Assistive Devices: Designer, industrial/product Ambulation/Gait Assistance: 5: Supervision Locomotion: Ambulation: 5: Travels 150 ft or more with supervision/safety issues  Comprehension Comprehension Mode: Auditory Comprehension: 6-Follows complex conversation/direction: With extra time/assistive device  Expression Expression Mode: Verbal Expression: 6-Expresses complex ideas: With extra time/assistive device  Social Interaction Social Interaction: 6-Interacts appropriately with others with medication or extra time (anti-anxiety, antidepressant).  Problem Solving Problem Solving: 6-Solves complex problems: With extra time  Memory Memory: 6-More than reasonable amt of time  Medical Problem List and Plan:  1. Cervical myelopathy status post ACDF 11/30/2011  2. DVT Prophylaxis/Anticoagulation: SCDs. Monitor for any signs of DVT  3. Pain Management: Neurontin  , Robaxin and oxycodone as needed.   -Right knee may be slightly contused, or this may be a prepatellar bursitis  -utilize ice, voltaren gel for knee. Can use voltaren for neck also 4. Mood: Cymbalta 30 mg daily, Ativan 0.5 mg every 8 hours as needed. Provide emotional support and positive reinforcement  5. Neuropsych: This patient is capable of making decisions on his/her own behalf.  6. Coronary artery disease a coronary artery bypass grafting. Aspirin 81 mg daily resumed. Resumed Plavix 75 mg daily in 2 weeks  7. diabetes mellitus. Hemoglobin A1c 7.5--can resume home regimen on discharge 8. Hypertension. Norvasc 10 mg daily- can resume at 5mg  daily for now. Increase as needed once home.  9. Hyperlipidemia. Lipitor 10. Sacral wound- stage 2-  Continue pressure relief LOS  (Days) 8 A FACE TO FACE EVALUATION WAS PERFORMED  SWARTZ,ZACHARY T 12/14/2011, 8:38 AM

## 2011-12-14 NOTE — Significant Event (Signed)
Hypoglycemic Event  CBG: 65  Treatment: 15 GM carbohydrate snack  Symptoms: None  Follow-up CBG: Time:0745 CBG Result:94  Possible Reasons for Event: Unknown  Comments/MD notified:pt ate breakfast with MD notified    [name]  Remember to initiate Hypoglycemia Order Set & complete

## 2011-12-14 NOTE — Plan of Care (Signed)
Problem: RH SKIN INTEGRITY Goal: RH STG MAINTAIN SKIN INTEGRITY WITH ASSISTANCE STG Maintain Skin Integrity With minimal Assistance.  Outcome: Completed/Met Date Met:  12/14/11 Pt arrived to unit with stage I on sacrum. No new skin breakdown

## 2011-12-14 NOTE — Progress Notes (Signed)
Social Work Discharge Note Discharge Note  The overall goal for the admission was met for:   Discharge location: Yes-HOME WITH DAUGHTER'S ASSISTING  Length of Stay: Yes-8 DAYS  Discharge activity level: Yes-SUPERVISION-MOD/I LEVEL  Home/community participation: Yes  Services provided included: MD, RD, PT, OT, RN, TR, Pharmacy and SW  Financial Services: Medicaid and Other: UHC-MEDICARE  Follow-up services arranged: Home Health: [facility]-PT,OT,RN, DME: [facility]-ROLL[facility]ALK[facility], [facility], [facility] and Patient/Family has no preference for HH/DME agencies  Comments (or additional information):FAMILY EDUCATION COMPLETED YESTERDAY AND DAUGHTER'S GIVEN AGENCY LIST IF PLAN TO HIRE CNA  Patient/Family verbalized understanding of follow-up arrangements: Yes  Individual responsible for coordination of the follow-up plan: PT & DAUGHTER'S  Confirmed correct DME delivered: Lucy Chris 12/14/2011    Lucy Chris

## 2011-12-14 NOTE — Plan of Care (Signed)
Problem: RH SKIN INTEGRITY Goal: RH STG SKIN FREE OF INFECTION/BREAKDOWN Skin free of infection/breakdown with minimal assistance  Outcome: Completed/Met Date Met:  12/14/11 Pt arrived to unit with Stage I. No further skin breakdown

## 2011-12-14 NOTE — Progress Notes (Signed)
Pt discharged at 1015 to home with family. Belongings with pt. Education and discharge instructions given to pt and daughter. No further questions at this time.

## 2011-12-14 NOTE — Discharge Summary (Signed)
[name], [name]              ACCOUNT NO.:  [id number]  MEDICAL RECORD NO.:  [id number]  LOCATION:  4153                         FACILITY:  [facility]  PHYSICIAN:  Ranelle Oyster, M.D.DATE OF BIRTH:  Feb 23, 1929  DATE OF ADMISSION:  12/06/2011 DATE OF DISCHARGE:  12/14/2011                              DISCHARGE SUMMARY   DISCHARGE DIAGNOSES:  Cervical myelopathy after ACDF November 30, 2011, sequential compression devices for DVT prophylaxis, pain management, depression, coronary artery disease with bypass grafting, diabetes mellitus, hypertension, hyperlipidemia.  This is an 78 year old right handed female with coronary artery disease, diabetes mellitus with peripheral neuropathy.  She was admitted November 18, 2011 after reported falls unwitnessed.  She was a poor historian.  The patient with upper extremity weakness noted after a fall.  Cranial CT scan was negative for acute intracranial abnormalities.  Noted creatinine upon admission of 1.48.  Carotid Dopplers completed showing no ICA stenosis.  MRI of cervical spine revealed acute injury to C5-6 with stenosis, signal change in the posterior ligamentous structure.  Follow up Neurosurgery, Dr. Marikay Alar.  Her Plavix and aspirin that she was on prior to admission was held and underwent decompressive anterior cervical discectomy C5-6 November 30, 2011 per Dr. Yetta Barre.  Advised cervical collar for comfort. Postoperative pain control.  Psychiatry consulted September 23 for mood stabilization with bouts of anxiety.  The patient was admitted for a comprehensive rehab program.  PAST MEDICAL HISTORY:  See discharge diagnoses.  SOCIAL HISTORY:  Lives alone, 1 level home, 2 steps to entry.  Functional status upon admission to rehab services was independent prior to admission, driving and retired.  Functional status upon admission to rehab services was minimal assist to ambulate 200 feet with platform walker.  PHYSICAL  EXAMINATION:  VITAL SIGNS:  Blood pressure 100/49, pulse 72, temperature 98, respirations 18. GENERAL:  This was an alert female, oriented to name, year, and place. She had some difficulty recalling full events related to the fall.  She followed all demonstrated commands. LUNGS:  Clear to auscultation. CARDIAC:  Regular rate and rhythm. ABDOMEN:  Soft, nontender.  Good bowel sounds.  Surgical site well healed.  REHABILITATION HOSPITAL COURSE:  The patient was admitted to inpatient rehab services with therapies initiated on a 3-hour daily basis consisting of physical therapy, occupational therapy, and rehabilitation nursing.  The following issues were addressed during the patient's rehabilitation stay.  Pertaining to [name]'s cervical myelopathy, she had undergone ACDF November 30, 2011 for Dr. Marikay Alar.  Surgical site healing nicely with cervical collar for comfort.  Sequential compression devices were in place for DVT prophylaxis.  Pain management with use of Neurontin 400 mg 3 times daily as well as Robaxin and oxycodone for breakthrough pain.  She did have a right knee bruise questionable prepatellar bursitis.  Voltaren gel was applied with good results.  She remained on lorazepam as well as Cymbalta for mood stabilization with emotional support provided.  She had been seen by Psychiatry Services during her hospital stay.  Aspirin and Plavix ongoing for coronary artery disease.  No chest pain or shortness of breath.  She had a hemoglobin A1c of 7.5 with history of diabetes mellitus.  She remained on glyburide 5 mg twice daily as well as Glucophage 500 mg a day.  Her blood sugar is controlled.  Close monitoring of blood pressure on Norvasc 10 mg daily.  She did have small sacral wound with barrier cream applied and routine pressure release ongoing.  The patient received weekly collaborative interdisciplinary team conferences to discuss estimated length of stay, family  teaching, and any barriers to discharge.  She was continent of bowel and bladder. Overall supervision steady assist for activities of daily living, steady assist to close supervision for her functional mobility.  Plan was to be discharged to home with agencies to be hired by family and daughter who was pursuing for caregivers and family teaching complete prior to discharge.  DISCHARGE MEDICATIONS: 1. Robaxin 500 mg every 6 hours as needed for muscle spasm. 2. Oxycodone 5 mg every 4 hours as needed for pain. 3. Norvasc 10 mg daily. 4. Aspirin 81 mg daily. 5. Lipitor 20 mg daily. 6. Barrier cream as needed to sacrum. 7. Voltaren gel 4 times daily to knee. 8. Cymbalta 30 mg p.o. daily. 9. Neurontin 400 mg p.o. t.i.d. 10.DiaBeta 5 mg p.o. b.i.d. 11.Tradjenta 5 mg p.o. daily. 12.Ativan 0.5 mg p.o. b.i.d. 13.Glucophage 500 mg p.o. daily. 14.Multivitamin daily. 15.MiraLAX 17 g daily with 8 ounce of water. 16.Plavix 75 mg p.o. daily.  DIET:  Mechanical soft diabetic diet.  SPECIAL INSTRUCTIONS:  Cervical collar for comfort.  The patient should follow up with Dr. Marikay Alar, Neurosurgery.  Call for appointment, Dr. Faith Rogue, outpatient rehab service office as needed.  Dr. Juluis Rainier, December 26, 2011.  Ongoing therapies were dictated as per rehab services.     [name], P.A.   ______________________________ Ranelle Oyster, M.D.    DA/MEDQ  D:  12/13/2011  T:  12/14/2011  Job:  [id number]  cc:   Juluis Rainier, M.D. Marikay Alar, MD Ranelle Oyster, M.D.

## 2011-12-15 ENCOUNTER — Encounter (HOSPITAL_COMMUNITY): Payer: Medicare Other | Admitting: Occupational Therapy

## 2011-12-15 ENCOUNTER — Inpatient Hospital Stay (HOSPITAL_COMMUNITY): Payer: Medicare Other

## 2011-12-15 ENCOUNTER — Inpatient Hospital Stay (HOSPITAL_COMMUNITY): Payer: Medicare Other | Admitting: Occupational Therapy

## 2011-12-21 ENCOUNTER — Telehealth: Payer: Self-pay

## 2011-12-21 NOTE — Telephone Encounter (Signed)
[name] patients daughter called requesting home health orders be faxed to [facility].  [phone].

## 2011-12-22 NOTE — Telephone Encounter (Signed)
Patient needs referral for in home assistance.  Patients daughter Aram Beecham is going to have request faxed to our office.  Fax number given.

## 2011-12-22 NOTE — Telephone Encounter (Signed)
Returning call about referral

## 2011-12-26 ENCOUNTER — Telehealth: Payer: Self-pay | Admitting: Physical Medicine & Rehabilitation

## 2011-12-26 NOTE — Telephone Encounter (Signed)
I advised [name] that since Dr. Riley Kill isn't going to be seeing the patient in the office, she will need to get referral from her Neurosurgeon.

## 2011-12-26 NOTE — Telephone Encounter (Signed)
Needs referral to [facility]

## 2011-12-29 ENCOUNTER — Other Ambulatory Visit: Payer: Self-pay | Admitting: Cardiology

## 2012-03-12 ENCOUNTER — Other Ambulatory Visit: Payer: Self-pay

## 2012-03-14 ENCOUNTER — Other Ambulatory Visit: Payer: Self-pay

## 2012-03-14 MED ORDER — ATORVASTATIN CALCIUM 20 MG PO TABS: 20.0000 mg | ORAL_TABLET | Freq: Every day | ORAL | Status: DC

## 2012-04-04 ENCOUNTER — Ambulatory Visit: Payer: Medicare Other | Admitting: Cardiology

## 2012-04-30 ENCOUNTER — Encounter: Payer: Self-pay | Admitting: Cardiology

## 2012-05-03 ENCOUNTER — Ambulatory Visit (INDEPENDENT_AMBULATORY_CARE_PROVIDER_SITE_OTHER): Payer: Medicare Other | Admitting: Cardiology

## 2012-05-03 ENCOUNTER — Encounter: Payer: Self-pay | Admitting: Cardiology

## 2012-05-03 VITALS — BP 177/72 | HR 91 | Ht 67.0 in | Wt 149.1 lb

## 2012-05-03 DIAGNOSIS — I10 Essential (primary) hypertension: Secondary | ICD-10-CM

## 2012-05-03 DIAGNOSIS — R0602 Shortness of breath: Secondary | ICD-10-CM

## 2012-05-03 DIAGNOSIS — I779 Disorder of arteries and arterioles, unspecified: Secondary | ICD-10-CM

## 2012-05-03 DIAGNOSIS — I2510 Atherosclerotic heart disease of native coronary artery without angina pectoris: Secondary | ICD-10-CM

## 2012-05-03 MED ORDER — AMLODIPINE BESYLATE 5 MG PO TABS: 5.0000 mg | ORAL_TABLET | Freq: Every day | ORAL | Status: DC

## 2012-05-03 NOTE — Progress Notes (Signed)
HPI   Patient is seen today for cardiology followup. I saw her last May, 2013. At that time she was stable. She had undergone CABG many years ago. In September, 2013 the patient had a fall. There was no documented cardiac syncope. Eventually she underwent neurosurgery for an acute injury at C5-6 with stenosis. She recovered over time. She has not had any significant chest pain or shortness of breath. At one point she may have had some orthostatic blood pressure drop in the past. Her amlodipine was stopped and she restarted it at a smaller dose. She is not having any significant dizziness with standing at this time.  Allergies  Allergen Reactions  . Codeine     Unknown    Current Outpatient Prescriptions  Medication Sig Dispense Refill  . amLODipine (NORVASC) 10 MG tablet Take 1 tablet (10 mg total) by mouth at bedtime.      Marland Kitchen aspirin 81 MG tablet Take 81 mg by mouth daily.        Marland Kitchen atorvastatin (LIPITOR) 20 MG tablet Take 1 tablet (20 mg total) by mouth daily.  90 tablet  0  . clopidogrel (PLAVIX) 75 MG tablet Take 1 tablet (75 mg total) by mouth daily. Continue on hold for 2 more weeks.  90 tablet  2  . DULoxetine (CYMBALTA) 30 MG capsule Take 1 capsule (30 mg total) by mouth daily.      . feeding supplement (ENSURE COMPLETE) LIQD Take 237 mLs by mouth 2 (two) times daily between meals.      . feeding supplement (ENSURE) PUDG Take 1 Container by mouth 2 (two) times daily with a meal.      . gabapentin (NEURONTIN) 400 MG capsule Take 200 mg by mouth 3 (three) times daily.      Marland Kitchen glyBURIDE (DIABETA) 5 MG tablet Take 5 mg by mouth 2 (two) times daily with a meal.        . linagliptin (TRADJENTA) 5 MG TABS tablet Take 1 tablet (5 mg total) by mouth daily.  30 tablet  1  . LORazepam (ATIVAN) 0.5 MG tablet Take 1 tablet (0.5 mg total) by mouth 2 (two) times daily.  60 tablet  1  . metFORMIN (GLUCOPHAGE) 500 MG tablet Take 2 tabs in the AM and 1 tab in the PM      . nitroGLYCERIN (NITROSTAT) 0.4  MG SL tablet Place 0.4 mg under the tongue every 5 (five) minutes as needed. For chest pain      . polyethylene glycol (MIRALAX / GLYCOLAX) packet Take 17 g by mouth daily.  14 each    . sitaGLIPtin (JANUVIA) 100 MG tablet Take 100 mg by mouth daily.         No current facility-administered medications for this visit.    History   Social History  . Marital Status: Divorced    Spouse Name: N/A    Number of Children: N/A  . Years of Education: N/A   Occupational History  . retired Electronics engineer   Social History Main Topics  . Smoking status: Never Smoker   . Smokeless tobacco: Not on file     Comment: quit in the 1970's  . Alcohol Use: No  . Drug Use: No  . Sexually Active: Not on file   Other Topics Concern  . Not on file   Social History Narrative  . No narrative on file    Family History  Problem Relation Age of Onset  .  Cancer Mother   . Heart failure Father   . Coronary artery disease Father   . Cancer      siblings  . Heart failure      siblings    Past Medical History  Diagnosis Date  . Shortness of breath   . CAD (coronary artery disease)     Last catheterization 2009, grafts patent, EF 70%, 80% small OM with possible mild ischemic  . Hx of CABG     2000, LIMA to LAD, SVG to diagonal, SVG to OM, SVG to posterior descending  . Ejection fraction     EF 70%,, 2009 /  EF 65%, echo, October, 2011  . Hypertension   . Palpitations   . Dyslipidemia   . Diabetes mellitus   . Pancreatitis   . Carotid artery disease     Mild, Doppler, 2000  . Dizziness     Evaluated April, 2008, no treatment needed  . Cervical spine disease     Severe neck pain, 2011  . Hyperkalemia     Her doctor Zachery Dauer, September, 2012, ACE inhibitor stopped, probable amlodipine to be  . Shortness of breath   . History of hysterectomy   . Chest pain     Chest hurting, May, 2013    Past Surgical History  Procedure Laterality Date  . Coronary artery bypass graft  2000  .  Cardiac catheterization  2009  . Cholecystectomy    . Anterior cervical decomp/discectomy fusion  11/30/2011    Procedure: ANTERIOR CERVICAL DECOMPRESSION/DISCECTOMY FUSION 1 LEVEL;  Surgeon: Tia Alert, MD;  Location: [facility];  Service: Neurosurgery;  Laterality: N/A;  Anterior Cervical Decompression Discectomy Cervical Five-Six    Patient Active Problem List  Diagnosis  . Shortness of breath  . CAD (coronary artery disease)  . Hx of CABG  . Ejection fraction  . Hypertension  . Palpitations  . Dyslipidemia  . DM (diabetes mellitus)  . Carotid artery disease  . Dizziness  . Cervical spine disease  . Hyperkalemia  . Chest pain  . UTI (lower urinary tract infection)  . Altered mental status  . Fall  . Upper extremity weakness  . Pain of upper extremity  . Fever  . Depression  . Myelopathy    ROS   Patient denies fever, chills, headache, sweats, rash, change in vision, change in hearing, chest pain, cough, nausea vomiting, urinary symptoms. All other systems are reviewed and are negative.  PHYSICAL EXAM   Patient is oriented to person time and place. Affect is normal. She's here with a family member. There is no jugulovenous distention. Lungs are clear. Respiratory effort is nonlabored. Cardiac exam reveals S1 and S2. There is a 2/6 systolic murmur. The abdomen is soft. There is no peripheral edema.  Filed Vitals:   05/03/12 1544  BP: 134/70  Pulse: 82  Height: 5\' 7"  (1.702 m)  Weight: 149 lb 1.9 oz (67.64 kg)  SpO2: 96%     ASSESSMENT & PLAN

## 2012-05-03 NOTE — Assessment & Plan Note (Signed)
The patient's carotids are followed carefully by Dr. Arbie Cookey

## 2012-05-03 NOTE — Assessment & Plan Note (Signed)
Blood pressure seems to be nicely controlled on the current medications. No change in therapy.

## 2012-05-03 NOTE — Patient Instructions (Addendum)
Your physician wants you to follow-up in: 1 year  You will receive a reminder letter in the mail two months in advance. If you don't receive a letter, please call our office to schedule the follow-up appointment.  Your physician recommends that you continue on your current medications as directed. Please refer to the Current Medication list given to you today.  

## 2012-05-03 NOTE — Assessment & Plan Note (Signed)
Patient is not having any significant shortness of breath. No further workup.

## 2012-05-03 NOTE — Assessment & Plan Note (Signed)
The patient's coronary status is stable. She does not need any testing at this time. No change in therapy.

## 2012-07-05 ENCOUNTER — Other Ambulatory Visit: Payer: Self-pay

## 2012-07-05 MED ORDER — AMLODIPINE BESYLATE 5 MG PO TABS: 5.0000 mg | ORAL_TABLET | Freq: Every day | ORAL | Status: DC

## 2012-07-05 NOTE — Telephone Encounter (Signed)
Patient Instructions    Your physician wants you to follow-up in: 1 year.   You will receive a reminder letter in the mail two months in advance. If you don't receive a letter, please call our office to schedule the follow-up appointment.   Your physician recommends that you continue on your current medications as directed. Please refer to the Current Medication list given to you today.    Patient Instructions History Recorded   Previous Visit      Provider Department Encounter #    04/04/2012  4:15 PM Willa Rough, MD [facility] [id number]

## 2012-07-09 ENCOUNTER — Telehealth: Payer: Self-pay

## 2012-07-09 NOTE — Telephone Encounter (Signed)
[facility] called and stated they never received refill done on April 25th 2014 for Norvasc. I sent in refill but it was not set to Normal so did not go through so pharmacist refilled Norvasc over the phone for 90 day supply with 1 refill.

## 2013-01-02 ENCOUNTER — Encounter: Payer: Self-pay | Admitting: Cardiology

## 2013-04-18 ENCOUNTER — Encounter: Payer: Self-pay | Admitting: Cardiology

## 2013-04-18 ENCOUNTER — Ambulatory Visit (INDEPENDENT_AMBULATORY_CARE_PROVIDER_SITE_OTHER): Payer: Medicare Other | Admitting: Cardiology

## 2013-04-18 VITALS — BP 130/80 | HR 83 | Ht 67.0 in | Wt 147.1 lb

## 2013-04-18 DIAGNOSIS — I10 Essential (primary) hypertension: Secondary | ICD-10-CM

## 2013-04-18 DIAGNOSIS — E785 Hyperlipidemia, unspecified: Secondary | ICD-10-CM

## 2013-04-18 DIAGNOSIS — I2510 Atherosclerotic heart disease of native coronary artery without angina pectoris: Secondary | ICD-10-CM

## 2013-04-18 MED ORDER — NITROGLYCERIN 0.4 MG SL SUBL: 0.4000 mg | SUBLINGUAL_TABLET | SUBLINGUAL | Status: AC | PRN

## 2013-04-18 NOTE — Progress Notes (Signed)
HPI  Patient is seen for cardiology followup. I saw her last February, 2014. She has coronary disease. She is post CABG many years ago. She's been stable. She's not having any chest pain or significant shortness of breath.  Allergies  Allergen Reactions  . Codeine     Unknown    Current Outpatient Prescriptions  Medication Sig Dispense Refill  . amLODipine (NORVASC) 5 MG tablet Take 1 tablet (5 mg total) by mouth at bedtime.  30 tablet  6  . aspirin 81 MG tablet Take 81 mg by mouth daily.        Marland Kitchen. atorvastatin (LIPITOR) 20 MG tablet Take 1 tablet (20 mg total) by mouth daily.  90 tablet  0  . clopidogrel (PLAVIX) 75 MG tablet Take 1 tablet (75 mg total) by mouth daily. Continue on hold for 2 more weeks.  90 tablet  2  . DULoxetine (CYMBALTA) 30 MG capsule Take 1 capsule (30 mg total) by mouth daily.      . feeding supplement (ENSURE COMPLETE) LIQD Take 237 mLs by mouth 2 (two) times daily between meals.      . feeding supplement (ENSURE) PUDG Take 1 Container by mouth 2 (two) times daily with a meal.      . furosemide (LASIX) 20 MG tablet Take 30 mg by mouth daily.      Marland Kitchen. gabapentin (NEURONTIN) 400 MG capsule Take 200 mg by mouth 3 (three) times daily.      Marland Kitchen. glimepiride (AMARYL) 2 MG tablet Take 2 mg by mouth daily with breakfast.      . LORazepam (ATIVAN) 0.5 MG tablet Take 1 tablet (0.5 mg total) by mouth 2 (two) times daily.  60 tablet  1  . metFORMIN (GLUCOPHAGE) 500 MG tablet Take 2 tabs in the AM and 1 tab in the PM      . nitroGLYCERIN (NITROSTAT) 0.4 MG SL tablet Place 0.4 mg under the tongue every 5 (five) minutes as needed. For chest pain      . nortriptyline (PAMELOR) 25 MG capsule Take 25 mg by mouth at bedtime.      . polyethylene glycol (MIRALAX / GLYCOLAX) packet Take 17 g by mouth daily.  14 each    . sitaGLIPtin (JANUVIA) 100 MG tablet Take 100 mg by mouth daily.         No current facility-administered medications for this visit.    History   Social History    . Marital Status: Divorced    Spouse Name: N/A    Number of Children: N/A  . Years of Education: N/A   Occupational History  . retired Electronics engineerther    bar owner   Social History Main Topics  . Smoking status: Never Smoker   . Smokeless tobacco: Not on file     Comment: quit in the 1970's  . Alcohol Use: No  . Drug Use: No  . Sexual Activity: Not on file   Other Topics Concern  . Not on file   Social History Narrative  . No narrative on file    Family History  Problem Relation Age of Onset  . Cancer Mother   . Heart failure Father   . Coronary artery disease Father   . Cancer      siblings  . Heart failure      siblings    Past Medical History  Diagnosis Date  . Shortness of breath   . CAD (coronary artery disease)  Last catheterization 2009, grafts patent, EF 70%, 80% small OM with possible mild ischemic  . Hx of CABG     2000, LIMA to LAD, SVG to diagonal, SVG to OM, SVG to posterior descending  . Ejection fraction     EF 70%,, 2009 /  EF 65%, echo, October, 2011  . Hypertension   . Palpitations   . Dyslipidemia   . Diabetes mellitus   . Pancreatitis   . Carotid artery disease     Mild, Doppler, 2000  . Dizziness     Evaluated April, 2008, no treatment needed  . Cervical spine disease     Severe neck pain, 2011  . Hyperkalemia     Her doctor Zachery Dauer, September, 2012, ACE inhibitor stopped, probable amlodipine to be  . Shortness of breath   . History of hysterectomy   . Chest pain     Chest hurting, May, 2013    Past Surgical History  Procedure Laterality Date  . Coronary artery bypass graft  2000  . Cardiac catheterization  2009  . Cholecystectomy    . Anterior cervical decomp/discectomy fusion  11/30/2011    Procedure: ANTERIOR CERVICAL DECOMPRESSION/DISCECTOMY FUSION 1 LEVEL;  Surgeon: Tia Alert, MD;  Location: [facility];  Service: Neurosurgery;  Laterality: N/A;  Anterior Cervical Decompression Discectomy Cervical Five-Six    Patient  Active Problem List   Diagnosis Date Noted  . Myelopathy 12/07/2011  . Depression 12/04/2011  . Fever 12/03/2011  . Pain of upper extremity 12/01/2011  . Upper extremity weakness 11/24/2011  . UTI (lower urinary tract infection) 11/18/2011  . Altered mental status 11/18/2011  . Fall 11/18/2011  . Chest pain   . Shortness of breath   . CAD (coronary artery disease)   . Hx of CABG   . Ejection fraction   . Hypertension   . Palpitations   . Dyslipidemia   . DM (diabetes mellitus)   . Carotid artery disease   . Dizziness   . Cervical spine disease   . Hyperkalemia     ROS   Patient denies fever, chills, headache, sweats, rash, change in vision, change in hearing, chest pain, cough, nausea or vomiting, urinary symptoms. All other systems are reviewed and are negative.  PHYSICAL EXAM   Patient is here with a friend. She is oriented to person time and place. Affect is normal. There is no jugulovenous distention. Lungs are clear. Respiratory effort is nonlabored. Cardiac exam reveals S1 and S2. There no clicks or significant murmurs. The abdomen is soft. Is no peripheral edema.  Filed Vitals:   04/18/13 1427  BP: 130/80  Pulse: 83  Height: 5\' 7"  (1.702 m)  Weight: 147 lb 1.9 oz (66.733 kg)   EKG is done today and reviewed by me. There is normal sinus rhythm. There are nonspecific ST-T wave changes with no significant change.  ASSESSMENT & PLAN

## 2013-04-18 NOTE — Assessment & Plan Note (Signed)
Lipids are treated. No change in therapy. 

## 2013-04-18 NOTE — Assessment & Plan Note (Signed)
Coronary disease is stable. No further workup at this time. 

## 2013-04-18 NOTE — Assessment & Plan Note (Signed)
Blood pressures control. No change in therapy. 

## 2013-04-18 NOTE — Patient Instructions (Signed)
NTG prescription sent to pharmacy   Your physician wants you to follow-up in: 1 year. You will receive a reminder letter in the mail two months in advance. If you don't receive a letter, please call our office to schedule the follow-up appointment.

## 2013-05-28 ENCOUNTER — Inpatient Hospital Stay (HOSPITAL_COMMUNITY)
Admission: EM | Admit: 2013-05-28 | Discharge: 2013-05-30 | DRG: 287 | Disposition: A | Discharge status: Home / Self Care | Payer: Medicare Other | Attending: Cardiology | Admitting: Cardiology

## 2013-05-28 ENCOUNTER — Emergency Department (HOSPITAL_COMMUNITY): Payer: Medicare Other

## 2013-05-28 ENCOUNTER — Encounter (HOSPITAL_COMMUNITY): Payer: Self-pay | Admitting: Emergency Medicine

## 2013-05-28 DIAGNOSIS — F329 Major depressive disorder, single episode, unspecified: Secondary | ICD-10-CM | POA: Diagnosis present

## 2013-05-28 DIAGNOSIS — E1165 Type 2 diabetes mellitus with hyperglycemia: Secondary | ICD-10-CM | POA: Diagnosis present

## 2013-05-28 DIAGNOSIS — Z981 Arthrodesis status: Secondary | ICD-10-CM

## 2013-05-28 DIAGNOSIS — E785 Hyperlipidemia, unspecified: Secondary | ICD-10-CM | POA: Diagnosis present

## 2013-05-28 DIAGNOSIS — Z951 Presence of aortocoronary bypass graft: Secondary | ICD-10-CM

## 2013-05-28 DIAGNOSIS — K219 Gastro-esophageal reflux disease without esophagitis: Secondary | ICD-10-CM | POA: Diagnosis present

## 2013-05-28 DIAGNOSIS — F3289 Other specified depressive episodes: Secondary | ICD-10-CM | POA: Diagnosis present

## 2013-05-28 DIAGNOSIS — I2582 Chronic total occlusion of coronary artery: Secondary | ICD-10-CM | POA: Diagnosis present

## 2013-05-28 DIAGNOSIS — F32A Depression, unspecified: Secondary | ICD-10-CM | POA: Diagnosis present

## 2013-05-28 DIAGNOSIS — I2510 Atherosclerotic heart disease of native coronary artery without angina pectoris: Principal | ICD-10-CM | POA: Diagnosis present

## 2013-05-28 DIAGNOSIS — I249 Acute ischemic heart disease, unspecified: Secondary | ICD-10-CM

## 2013-05-28 DIAGNOSIS — I10 Essential (primary) hypertension: Secondary | ICD-10-CM | POA: Diagnosis present

## 2013-05-28 DIAGNOSIS — E119 Type 2 diabetes mellitus without complications: Secondary | ICD-10-CM

## 2013-05-28 DIAGNOSIS — Z8249 Family history of ischemic heart disease and other diseases of the circulatory system: Secondary | ICD-10-CM

## 2013-05-28 DIAGNOSIS — I200 Unstable angina: Secondary | ICD-10-CM

## 2013-05-28 DIAGNOSIS — IMO0002 Reserved for concepts with insufficient information to code with codable children: Secondary | ICD-10-CM | POA: Diagnosis present

## 2013-05-28 DIAGNOSIS — R4182 Altered mental status, unspecified: Secondary | ICD-10-CM

## 2013-05-28 LAB — CBC
HCT: 37.9 % (ref 36.0–46.0)
HEMOGLOBIN: 12.8 g/dL (ref 12.0–15.0)
MCH: 30.3 pg (ref 26.0–34.0)
MCHC: 33.8 g/dL (ref 30.0–36.0)
MCV: 89.6 fL (ref 78.0–100.0)
PLATELETS: 200 10*3/uL (ref 150–400)
RBC: 4.23 MIL/uL (ref 3.87–5.11)
RDW: 12.9 % (ref 11.5–15.5)
WBC: 7.6 10*3/uL (ref 4.0–10.5)

## 2013-05-28 LAB — BASIC METABOLIC PANEL
BUN: 21 mg/dL (ref 6–23)
CHLORIDE: 95 meq/L — AB (ref 96–112)
CO2: 26 meq/L (ref 19–32)
CREATININE: 0.97 mg/dL (ref 0.50–1.10)
Calcium: 9.9 mg/dL (ref 8.4–10.5)
GFR calc Af Amer: 61 mL/min — ABNORMAL LOW (ref >90)
GFR calc non Af Amer: 53 mL/min — ABNORMAL LOW (ref >90)
GLUCOSE: 261 mg/dL — AB (ref 70–99)
Potassium: 4.8 mEq/L (ref 3.7–5.3)
SODIUM: 137 meq/L (ref 137–147)

## 2013-05-28 LAB — PROTIME-INR
INR: 1 (ref 0.00–1.49)
PROTHROMBIN TIME: 13 s (ref 11.6–15.2)

## 2013-05-28 LAB — I-STAT TROPONIN, ED: TROPONIN I, POC: 0.01 ng/mL (ref 0.00–0.08)

## 2013-05-28 LAB — GLUCOSE, CAPILLARY: Glucose-Capillary: 209 mg/dL — ABNORMAL HIGH (ref 70–99)

## 2013-05-28 LAB — PRO B NATRIURETIC PEPTIDE: PRO B NATRI PEPTIDE: 209.8 pg/mL (ref 0–450)

## 2013-05-28 MED ORDER — METOPROLOL SUCCINATE ER 25 MG PO TB24
25.0000 mg | ORAL_TABLET | Freq: Every day | ORAL | Status: DC
  Administered 2013-05-28 – 2013-05-30 (×3): 25 mg via ORAL
  Filled 2013-05-28 (×3): qty 1

## 2013-05-28 MED ORDER — AMLODIPINE BESYLATE 5 MG PO TABS
5.0000 mg | ORAL_TABLET | Freq: Every day | ORAL | Status: DC
  Administered 2013-05-28 – 2013-05-30 (×3): 5 mg via ORAL
  Filled 2013-05-28 (×3): qty 1

## 2013-05-28 MED ORDER — FENTANYL CITRATE 0.05 MG/ML IJ SOLN: 25.0000 ug | Freq: Once | INTRAMUSCULAR | Status: DC

## 2013-05-28 MED ORDER — ENSURE PUDDING PO PUDG
1.0000 | Freq: Two times a day (BID) | ORAL | Status: DC
  Administered 2013-05-30: 1 via ORAL

## 2013-05-28 MED ORDER — NITROGLYCERIN 2 % TD OINT
1.0000 [in_us] | TOPICAL_OINTMENT | Freq: Once | TRANSDERMAL | Status: AC
  Administered 2013-05-28: 1 [in_us] via TOPICAL
  Filled 2013-05-28: qty 1

## 2013-05-28 MED ORDER — GABAPENTIN 100 MG PO CAPS
200.0000 mg | ORAL_CAPSULE | Freq: Two times a day (BID) | ORAL | Status: DC
  Administered 2013-05-28 – 2013-05-30 (×3): 200 mg via ORAL
  Filled 2013-05-28 (×6): qty 2

## 2013-05-28 MED ORDER — SODIUM CHLORIDE 0.9 % IJ SOLN: 3.0000 mL | Freq: Two times a day (BID) | INTRAMUSCULAR | Status: DC

## 2013-05-28 MED ORDER — METFORMIN HCL 500 MG PO TABS: 500.0000 mg | ORAL_TABLET | Freq: Two times a day (BID) | ORAL | Status: DC

## 2013-05-28 MED ORDER — ASPIRIN 81 MG PO CHEW
81.0000 mg | CHEWABLE_TABLET | Freq: Every day | ORAL | Status: DC
  Administered 2013-05-30: 81 mg via ORAL
  Filled 2013-05-28: qty 1

## 2013-05-28 MED ORDER — GLIMEPIRIDE 2 MG PO TABS
2.0000 mg | ORAL_TABLET | Freq: Every day | ORAL | Status: DC
  Administered 2013-05-30: 2 mg via ORAL
  Filled 2013-05-28 (×3): qty 1

## 2013-05-28 MED ORDER — ATORVASTATIN CALCIUM 20 MG PO TABS
20.0000 mg | ORAL_TABLET | Freq: Every day | ORAL | Status: DC
  Administered 2013-05-28 – 2013-05-30 (×3): 20 mg via ORAL
  Filled 2013-05-28 (×3): qty 1

## 2013-05-28 MED ORDER — NITROGLYCERIN IN D5W 200-5 MCG/ML-% IV SOLN
10.0000 ug/min | INTRAVENOUS | Status: AC
  Administered 2013-05-28: 10 ug/min via INTRAVENOUS
  Filled 2013-05-28: qty 250

## 2013-05-28 MED ORDER — ASPIRIN 81 MG PO TABS
81.0000 mg | ORAL_TABLET | Freq: Every day | ORAL | Status: DC
  Administered 2013-05-28: 81 mg via ORAL

## 2013-05-28 MED ORDER — ASPIRIN 81 MG PO CHEW
324.0000 mg | CHEWABLE_TABLET | Freq: Once | ORAL | Status: AC
  Administered 2013-05-28: 324 mg via ORAL
  Filled 2013-05-28: qty 4

## 2013-05-28 MED ORDER — ENOXAPARIN SODIUM 60 MG/0.6ML ~~LOC~~ SOLN
60.0000 mg | Freq: Two times a day (BID) | SUBCUTANEOUS | Status: DC
  Administered 2013-05-28 – 2013-05-30 (×3): 60 mg via SUBCUTANEOUS
  Filled 2013-05-28 (×7): qty 0.6

## 2013-05-28 MED ORDER — CLOPIDOGREL BISULFATE 75 MG PO TABS
75.0000 mg | ORAL_TABLET | Freq: Every day | ORAL | Status: DC
  Administered 2013-05-28 – 2013-05-30 (×3): 75 mg via ORAL
  Filled 2013-05-28 (×3): qty 1

## 2013-05-28 MED ORDER — NITROGLYCERIN 0.4 MG SL SUBL
0.4000 mg | SUBLINGUAL_TABLET | SUBLINGUAL | Status: DC | PRN
Start: 2013-05-28 — End: 2013-05-30

## 2013-05-28 MED ORDER — SODIUM CHLORIDE 0.9 % IV SOLN: 250.0000 mL | INTRAVENOUS | Status: DC | PRN

## 2013-05-28 MED ORDER — LORAZEPAM 0.5 MG PO TABS
0.5000 mg | ORAL_TABLET | Freq: Two times a day (BID) | ORAL | Status: DC
  Administered 2013-05-28 – 2013-05-29 (×3): 0.5 mg via ORAL
  Filled 2013-05-28 (×3): qty 1

## 2013-05-28 MED ORDER — ASPIRIN 81 MG PO CHEW
81.0000 mg | CHEWABLE_TABLET | ORAL | Status: AC
Start: 2013-05-29 — End: 2013-05-29
  Administered 2013-05-29: 81 mg via ORAL
  Filled 2013-05-28 (×2): qty 1

## 2013-05-28 MED ORDER — NITROGLYCERIN 0.4 MG SL SUBL: 0.4000 mg | SUBLINGUAL_TABLET | SUBLINGUAL | Status: DC | PRN

## 2013-05-28 MED ORDER — MORPHINE SULFATE 2 MG/ML IJ SOLN
2.0000 mg | Freq: Once | INTRAMUSCULAR | Status: AC
  Administered 2013-05-28: 2 mg via INTRAVENOUS
  Filled 2013-05-28: qty 1

## 2013-05-28 MED ORDER — DULOXETINE HCL 60 MG PO CPEP
60.0000 mg | ORAL_CAPSULE | Freq: Every day | ORAL | Status: DC
  Administered 2013-05-28 – 2013-05-30 (×3): 60 mg via ORAL
  Filled 2013-05-28 (×3): qty 1

## 2013-05-28 MED ORDER — LINAGLIPTIN 5 MG PO TABS
5.0000 mg | ORAL_TABLET | Freq: Every day | ORAL | Status: DC
  Administered 2013-05-28 – 2013-05-30 (×2): 5 mg via ORAL
  Filled 2013-05-28 (×3): qty 1

## 2013-05-28 MED ORDER — SODIUM CHLORIDE 0.9 % IJ SOLN: 3.0000 mL | INTRAMUSCULAR | Status: DC | PRN

## 2013-05-28 MED ORDER — ENSURE COMPLETE PO LIQD
237.0000 mL | Freq: Two times a day (BID) | ORAL | Status: DC
  Administered 2013-05-30: 237 mL via ORAL

## 2013-05-28 NOTE — ED Notes (Signed)
Per Flow Manager, Pt will receive bed soon. Bed request was not placed at the time of admission.

## 2013-05-28 NOTE — Progress Notes (Signed)
ANTICOAGULATION CONSULT NOTE - Initial Consult  Pharmacy Consult for Lovenox Indication: chest pain/ACS  Allergies  Allergen Reactions  . Codeine     Unknown    Patient Measurements: Height: 5\' 7"  (170.2 cm) Weight: 140 lb (63.504 kg) IBW/kg (Calculated) : 61.6  Vital Signs: Temp: 98.7 F (37.1 C) (03/18 1138) Temp src: Oral (03/18 1138) BP: 116/66 mmHg (03/18 1930) Pulse Rate: 80 (03/18 1930)  Labs:  Recent Labs  05/28/13 1200  HGB 12.8  HCT 37.9  PLT 200  LABPROT 13.0  INR 1.00  CREATININE 0.97    Estimated Creatinine Clearance: 43.5 ml/min (by C-G formula based on Cr of 0.97).   Medical History: Past Medical History  Diagnosis Date  . Shortness of breath   . CAD (coronary artery disease)     Last catheterization 2009, grafts patent, EF 70%, 80% small OM with possible mild ischemic  . Hx of CABG     2000, LIMA to LAD, SVG to diagonal, SVG to OM, SVG to posterior descending  . Ejection fraction     EF 70%,, 2009 /  EF 65%, echo, October, 2011  . Hypertension   . Palpitations   . Dyslipidemia   . Diabetes mellitus   . Pancreatitis   . Carotid artery disease     Mild, Doppler, 2000  . Dizziness     Evaluated April, 2008, no treatment needed  . Cervical spine disease     Severe neck pain, 2011  . Hyperkalemia     Her doctor Zachery DauerBarnes, September, 2012, ACE inhibitor stopped, probable amlodipine to be  . Shortness of breath   . History of hysterectomy   . Chest pain     Chest hurting, May, 2013   Assessment: 80 yo F with hx of CABG in 2000 and cath in 2009 who presents to ED after experiencing lower sternal CP about 12 hours prior.  Pharmacy has been consulted to dose Lovenox for ACS.  Initial labs reveal H/H and Plt's wnl.  I spoke with patient and the only "blood thinners" PTA are ASA and Plavix.  She reports no recent bleeding issues and has no hx of stroke.  Goal of Therapy:  Anti-Xa level 0.6-1 units/ml 4hrs after LMWH dose given Monitor platelets  by anticoagulation protocol: Yes   Plan:  - start Lovenox SQ 60mg  q12h - draw CBC q72h - daily BMP for now to monitor SCr and kidney function - monitor for s/s of bleeding  Harrold DonathNathan E. Achilles [place]unkope, PharmD Clinical Pharmacist - Resident Pager: [phone] Pharmacy: [phone] 05/28/2013 7:49 PM

## 2013-05-28 NOTE — ED Notes (Signed)
80 yo female via EMS presents with nausea that started at 0830 that changed into chx pressure w/ SOB around 1030am. Pt took 1 baby ASA and 1 nitro with no relief. Pain is epigastric and left side with no radiation. Hx of MI x2, HTN, DM, CHF. EMS gave Nitro x3  And 4mg  of Zofran. Pain was 10/10. Initial BP 190/100 now 109/68. CBG 69.

## 2013-05-28 NOTE — H&P (Signed)
[name] is a 80 y.o. female  Admit Date: 05/28/2013 Referring Physician: Willa Rough, M.D. Primary Cardiologist:: Willa Rough, M.D. Primary Physician: Juluis Rainier, M.D. Chief complaint / reason for admission: Chest discomfort  HPI: 80 year old female with history of prior coronary bypass grafting in 2000 with LIMA to LAD, SVG to diagonal, SVG to OM, and SVG to PDA. Last LVEF in 2011 was 65%. During approximately 12 hours ago the patient began experiencing lower sternal chest aching. The discomfort has been waxing and waning in severity and nearly continuous. In the ER after IV morphine the discomfort improved. She denies dyspnea. This is a new and unusual discomfort. It is dissimilar to pain that she had in 2013. She cannot remember the quality of discomfort she had prior coronary bypass grafting 15 years ago. There is no associated dyspnea, diaphoresis, radiation, palpitations, or other complaints.    PMH:    Past Medical History  Diagnosis Date  . Shortness of breath   . CAD (coronary artery disease)     Last catheterization 2009, grafts patent, EF 70%, 80% small OM with possible mild ischemic  . Hx of CABG     2000, LIMA to LAD, SVG to diagonal, SVG to OM, SVG to posterior descending  . Ejection fraction     EF 70%,, 2009 /  EF 65%, echo, October, 2011  . Hypertension   . Palpitations   . Dyslipidemia   . Diabetes mellitus   . Pancreatitis   . Carotid artery disease     Mild, Doppler, 2000  . Dizziness     Evaluated April, 2008, no treatment needed  . Cervical spine disease     Severe neck pain, 2011  . Hyperkalemia     Her doctor Zachery Dauer, September, 2012, ACE inhibitor stopped, probable amlodipine to be  . Shortness of breath   . History of hysterectomy   . Chest pain     Chest hurting, May, 2013    PSH:    Past Surgical History  Procedure Laterality Date  . Coronary artery bypass graft  2000  . Cardiac catheterization  2009  . Cholecystectomy      . Anterior cervical decomp/discectomy fusion  11/30/2011    Procedure: ANTERIOR CERVICAL DECOMPRESSION/DISCECTOMY FUSION 1 LEVEL;  Surgeon: Tia Alert, MD;  Location: [facility];  Service: Neurosurgery;  Laterality: N/A;  Anterior Cervical Decompression Discectomy Cervical Five-Six   ALLERGIES:   Codeine Prior to Admit Meds:   (Not in a hospital admission) Family HX:    Family History  Problem Relation Age of Onset  . Cancer Mother   . Heart failure Father   . Coronary artery disease Father   . Cancer      siblings  . Heart failure      siblings   Social HX:    History   Social History  . Marital Status: Divorced    Spouse Name: N/A    Number of Children: N/A  . Years of Education: N/A   Occupational History  . retired Electronics engineer   Social History Main Topics  . Smoking status: Never Smoker   . Smokeless tobacco: Not on file     Comment: quit in the 1970's  . Alcohol Use: No  . Drug Use: No  . Sexual Activity: Not on file   Other Topics Concern  . Not on file   Social History Narrative  . No narrative on file  ROS: Relatively sedentary since anterior cervical disc in 2013. Balance is poor. Occasional falls. Denies orthopnea, PND, cough, hemoptysis, weight loss, and decreased appetite  Physical Exam: Blood pressure 147/72, pulse 86, temperature 98.7 F (37.1 C), temperature source Oral, resp. rate 16, SpO2 100.00%.    Relatively pale skin Neck veins are flat with the patient lying at 20. No carotid bruits. The lungs are clear auscultation and percussion. Cardiac exam reveals a 2/6 systolic murmur at the apex radiation to the axilla. The abdomen is soft. No bruits or pulsatile masses are noted. No tenderness is noted. The neurological exam reveals a flat affect. No focal deficits Pulses are 2+ and symmetric in the radials, carotids, and posterior tibials  Labs: Lab Results  Component Value Date   WBC 7.6 05/28/2013   HGB 12.8 05/28/2013    HCT 37.9 05/28/2013   MCV 89.6 05/28/2013   PLT 200 05/28/2013    Recent Labs Lab 05/28/13 1200  NA 137  K 4.8  CL 95*  CO2 26  BUN 21  CREATININE 0.97  CALCIUM 9.9  GLUCOSE 261*   Cardiac Panel (last 3 results) No results found for this basename: CKTOTAL, CKMB, TROPONINI, RELINDX,  in the last 72 hours   Radiology: No acute abnormality on chest x-ray performed on 05/28/13  EKG:  Normal sinus rhythm with nonspecific T-wave abnormality. No evidence of ischemia or injury.  ASSESSMENT:  1. Prolonged chest pain in a characteristic location for ischemic etiology, in a patient with 80 year old coronary vein grafts. Initial point-of-care markers and EKGs are nondiagnostic. Stores compatible with unstable angina.  2. Diabetes mellitus  3. Hypertension   Plan:  1. IV nitroglycerin 2. Lovenox subcutaneous 3. Admit to step down 4. Serial cardiac markers 5. Diagnostic coronary angiography and possible PCI on 05/29/2013 or sooner if evidence of infarction is identified.  Lesleigh NoeSMITH III,HENRY W 05/28/2013 5:16 PM

## 2013-05-28 NOTE — ED Provider Notes (Signed)
CSN: [id number]     Arrival date & time 05/28/13  1117 History   First MD Initiated Contact with Patient 05/28/13 1251     Chief Complaint  Patient presents with  . Chest Pain  . Nausea     (Consider location/radiation/quality/duration/timing/severity/associated sxs/prior Treatment) Patient is a 80 y.o. female presenting with chest pain. The history is provided by the patient and a relative.  Chest Pain  She describes chest pain, felt as a pressure sensation in her mid chest, at 8:00 this morning. The pain has been waxing and waning. At 1300 hours she says the pain is 8/10. At home after the pain started she took a 81mg  ASA, and a single nitroglycerin. She was transported by EMS, who gave her 3 sublingual nitroglycerin, and Zofran, her pain improved somewhat after that. She has episodes of chest discomfort, similar to this, but not lasting as long several times each year. She last used nitroglycerin about one month ago. She sees cardiology regularly, but has not had a recent risk stratification, or invasive procedure to assess her heart. She denies other, associated symptoms. There's been no fever, chills, cough, weakness, or dizziness. There are no other known modifying factors.  Past Medical History  Diagnosis Date  . Shortness of breath   . CAD (coronary artery disease)     Last catheterization 2009, grafts patent, EF 70%, 80% small OM with possible mild ischemic  . Hx of CABG     2000, LIMA to LAD, SVG to diagonal, SVG to OM, SVG to posterior descending  . Ejection fraction     EF 70%,, 2009 /  EF 65%, echo, October, 2011  . Hypertension   . Palpitations   . Dyslipidemia   . Diabetes mellitus   . Pancreatitis   . Carotid artery disease     Mild, Doppler, 2000  . Dizziness     Evaluated April, 2008, no treatment needed  . Cervical spine disease     Severe neck pain, 2011  . Hyperkalemia     Her doctor Zachery Dauer, September, 2012, ACE inhibitor stopped, probable amlodipine to be   . Shortness of breath   . History of hysterectomy   . Chest pain     Chest hurting, May, 2013   Past Surgical History  Procedure Laterality Date  . Coronary artery bypass graft  2000  . Cardiac catheterization  2009  . Cholecystectomy    . Anterior cervical decomp/discectomy fusion  11/30/2011    Procedure: ANTERIOR CERVICAL DECOMPRESSION/DISCECTOMY FUSION 1 LEVEL;  Surgeon: Tia Alert, MD;  Location: [facility];  Service: Neurosurgery;  Laterality: N/A;  Anterior Cervical Decompression Discectomy Cervical Five-Six   Family History  Problem Relation Age of Onset  . Cancer Mother   . Heart failure Father   . Coronary artery disease Father   . Cancer      siblings  . Heart failure      siblings   History  Substance Use Topics  . Smoking status: Never Smoker   . Smokeless tobacco: Not on file     Comment: quit in the 1970's  . Alcohol Use: No   OB History   Grav Para Term Preterm Abortions TAB SAB Ect Mult Living                 Review of Systems  Cardiovascular: Positive for chest pain.  All other systems reviewed and are negative.      Allergies  Codeine  Home Medications  No current outpatient prescriptions on file. BP 107/59  Pulse 68  Temp(Src) 98.2 F (36.8 C) (Oral)  Resp 18  Ht 5\' 7"  (1.702 m)  Wt 140 lb (63.504 kg)  BMI 21.92 kg/m2  SpO2 94% Physical Exam  Nursing note and vitals reviewed. Constitutional: She is oriented to person, place, and time. She appears well-developed.  Elderly frail  HENT:  Head: Normocephalic and atraumatic.  Eyes: Conjunctivae and EOM are normal. Pupils are equal, round, and reactive to light.  Neck: Normal range of motion and phonation normal. Neck supple.  Cardiovascular: Normal rate, regular rhythm and intact distal pulses.   Pulmonary/Chest: Effort normal and breath sounds normal. No respiratory distress. She exhibits no tenderness.  Abdominal: Soft. She exhibits no distension. There is no tenderness.  There is no guarding.  Musculoskeletal: Normal range of motion. She exhibits no edema and no tenderness.  Neurological: She is alert and oriented to person, place, and time. She exhibits normal muscle tone.  Skin: Skin is warm and dry.  Psychiatric: She has a normal mood and affect. Her behavior is normal. Judgment and thought content normal.    ED Course  Procedures (including critical care time)  Medications  amLODipine (NORVASC) tablet 5 mg ( Oral MAR Unhold 05/29/13 1605)  DULoxetine (CYMBALTA) DR capsule 60 mg ( Oral MAR Unhold 05/29/13 1605)  gabapentin (NEURONTIN) capsule 200 mg ( Oral MAR Unhold 05/29/13 1605)  glimepiride (AMARYL) tablet 2 mg ( Oral MAR Unhold 05/29/13 1605)  atorvastatin (LIPITOR) tablet 20 mg ( Oral MAR Unhold 05/29/13 1605)  LORazepam (ATIVAN) tablet 0.5 mg ( Oral MAR Unhold 05/29/13 1605)  clopidogrel (PLAVIX) tablet 75 mg ( Oral MAR Unhold 05/29/13 1605)  feeding supplement (ENSURE COMPLETE) (ENSURE COMPLETE) liquid 237 mL ( Oral MAR Unhold 05/29/13 1605)  feeding supplement (ENSURE) (ENSURE) pudding 1 Container (1 Container Oral Not Given 05/29/13 1700)  linagliptin (TRADJENTA) tablet 5 mg ( Oral MAR Unhold 05/29/13 1605)  nitroGLYCERIN (NITROSTAT) SL tablet 0.4 mg ( Sublingual MAR Unhold 05/29/13 1605)  metoprolol succinate (TOPROL-XL) 24 hr tablet 25 mg ( Oral MAR Unhold 05/29/13 1605)  enoxaparin (LOVENOX) injection 60 mg ( Subcutaneous MAR Unhold 05/29/13 1605)  aspirin chewable tablet 81 mg ( Oral MAR Unhold 05/29/13 1605)  fentaNYL (SUBLIMAZE) injection 25 mcg ( Intravenous MAR Unhold 05/29/13 1605)  insulin aspart (novoLOG) injection 0-9 Units (0 Units Subcutaneous Not Given 05/29/13 1700)  0.9 %  sodium chloride infusion ( Intravenous New Bag/Given 05/29/13 1536)  aspirin chewable tablet 324 mg (324 mg Oral Given 05/28/13 1316)  nitroGLYCERIN (NITROGLYN) 2 % ointment 1 inch (1 inch Topical Given 05/28/13 1316)  morphine 2 MG/ML injection 2 mg (2 mg Intravenous Given  05/28/13 1333)  nitroGLYCERIN 0.2 mg/mL in dextrose 5 % infusion (10 mcg/min Intravenous New Bag/Given 05/28/13 1743)  aspirin chewable tablet 81 mg (81 mg Oral Given 05/29/13 0643)  gi cocktail (Maalox,Lidocaine,Donnatal) (30 mLs Oral Given 05/29/13 1151)  verapamil (ISOPTIN) 2.5 MG/ML injection (not administered)    Patient Vitals for the past 24 hrs:  BP Temp Temp src Pulse Resp SpO2 Height Weight  05/29/13 1830 107/59 mmHg - - 68 - - - -  05/29/13 1800 94/52 mmHg - - 71 - - - -  05/29/13 1730 101/56 mmHg - - 72 - - - -  05/29/13 1715 100/62 mmHg - - 76 - - - -  05/29/13 1700 102/60 mmHg - - 72 - - - -  05/29/13 1645 95/62 mmHg - - 68 -  94 % - -  05/29/13 1630 96/52 mmHg - - - - - - -  05/29/13 1610 93/52 mmHg 98.2 F (36.8 C) Oral 67 18 94 % 5\' 7"  (1.702 m) 140 lb (63.504 kg)  05/29/13 1445 - - - 78 - - - -  05/29/13 1113 105/58 mmHg 98.4 F (36.9 C) Oral 72 19 97 % - -  05/29/13 0715 128/59 mmHg 98.5 F (36.9 C) Oral 81 13 99 % - -  05/29/13 0500 - - - - - - - 142 lb 10.2 oz (64.7 kg)  05/29/13 0418 102/57 mmHg 98.6 F (37 C) Oral 78 15 99 % - -  05/28/13 2352 135/62 mmHg 98.9 F (37.2 C) Oral 76 18 98 % - -  05/28/13 2330 - - - 78 13 100 % - -  05/28/13 2300 - - - 76 13 100 % - -  05/28/13 2250 124/72 mmHg - - 73 13 99 % - -  05/28/13 2200 136/61 mmHg - - 90 24 100 % - -  05/28/13 2145 125/72 mmHg - - 81 17 100 % - -  05/28/13 2130 142/71 mmHg - - 77 17 100 % - -  05/28/13 2115 111/63 mmHg 98.9 F (37.2 C) Oral 85 22 98 % 5\' 7"  (1.702 m) 142 lb 13.7 oz (64.8 kg)  05/28/13 2000 105/57 mmHg - - 80 18 100 % - -   1:10 PM-Consult complete with on call Cardiology for Dr. Myrtis SerKatz. Patient case explained and discussed. She agrees to admit patient for further evaluation and treatment. Call ended at 1315  15:50 PM Reevaluation with update and discussion. After initial assessment and treatment, an updated evaluation reveals pain some better now, 7/10, she is hungry. Joury Allcorn L    CRITICAL CARE Performed by: Mancel BaleWENTZ,Johnney Scarlata L Total critical care time: 40 minutes Critical care time was exclusive of separately billable procedures and treating other patients. Critical care was necessary to treat or prevent imminent or life-threatening deterioration. Critical care was time spent personally by me on the following activities: development of treatment plan with patient and/or surrogate as well as nursing, discussions with consultants, evaluation of patient's response to treatment, examination of patient, obtaining history from patient or surrogate, ordering and performing treatments and interventions, ordering and review of laboratory studies, ordering and review of radiographic studies, pulse oximetry and re-evaluation of patient's condition.  Labs Review Labs Reviewed  BASIC METABOLIC PANEL - Abnormal; Notable for the following:    Chloride 95 (*)    Glucose, Bld 261 (*)    GFR calc non Af Amer 53 (*)    GFR calc Af Amer 61 (*)    All other components within normal limits  CBC - Abnormal; Notable for the following:    Hemoglobin 11.8 (*)    HCT 34.9 (*)    All other components within normal limits  BASIC METABOLIC PANEL - Abnormal; Notable for the following:    Glucose, Bld 184 (*)    GFR calc non Af Amer 54 (*)    GFR calc Af Amer 63 (*)    All other components within normal limits  CBC WITH DIFFERENTIAL - Abnormal; Notable for the following:    HCT 35.9 (*)    All other components within normal limits  COMPREHENSIVE METABOLIC PANEL - Abnormal; Notable for the following:    Glucose, Bld 195 (*)    GFR calc non Af Amer 55 (*)    GFR calc Af Amer 64 (*)  All other components within normal limits  MAGNESIUM - Abnormal; Notable for the following:    Magnesium 1.1 (*)    All other components within normal limits  HEMOGLOBIN A1C - Abnormal; Notable for the following:    Hemoglobin A1C 8.8 (*)    Mean Plasma Glucose 206 (*)    All other components within normal  limits  GLUCOSE, CAPILLARY - Abnormal; Notable for the following:    Glucose-Capillary 209 (*)    All other components within normal limits  GLUCOSE, CAPILLARY - Abnormal; Notable for the following:    Glucose-Capillary 220 (*)    All other components within normal limits  HEMOGLOBIN A1C - Abnormal; Notable for the following:    Hemoglobin A1C 8.9 (*)    Mean Plasma Glucose 209 (*)    All other components within normal limits  GLUCOSE, CAPILLARY - Abnormal; Notable for the following:    Glucose-Capillary 216 (*)    All other components within normal limits  GLUCOSE, CAPILLARY - Abnormal; Notable for the following:    Glucose-Capillary 105 (*)    All other components within normal limits  MRSA PCR SCREENING  CBC  PRO B NATRIURETIC PEPTIDE  PROTIME-INR  TROPONIN I  TROPONIN I  TROPONIN I  T4, FREE  BASIC METABOLIC PANEL  I-STAT TROPOININ, ED   Imaging Review Dg Chest 2 View  05/28/2013   CLINICAL DATA:  Chest pain, nausea, weakness, shortness of breath, history coronary artery disease post CABG, diabetes, hypertension  EXAM: CHEST  2 VIEW  COMPARISON:  12/02/2011  FINDINGS: Normal heart size post CABG.  Mediastinal contours and pulmonary vascularity normal.  Minimal bibasilar scarring.  Lungs appear emphysematous but otherwise clear.  No pleural effusion or pneumothorax.  Osseous demineralization with prior cervical spine fusion.  IMPRESSION: Post CABG.  Question COPD.  No acute abnormalities.   Electronically Signed   By: Ulyses Southward M.D.   On: 05/28/2013 13:00     EKG Interpretation   Date/Time:  Wednesday May 28 2013 11:31:04 EDT Ventricular Rate:  85 PR Interval:  161 QRS Duration: 81 QT Interval:  449 QTC Calculation: 534 R Axis:   61 Text Interpretation:  Sinus rhythm Atrial premature complex Abnormal T,  consider ischemia, lateral leads Prolonged QT interval Since last tracing  QT has lengthened Confirmed by Effie Shy  MD, Lasaro Primm ([id number]) on 05/28/2013  12:53:26 PM       MDM   Final diagnoses:  ACS (acute coronary syndrome)   Evaluation is consistent with unstable angina/ACS. He is hemodynamically stable. No indication for inciting cause. Need to be admitted for observation, further testing and likely additional cardiac evaluation.  Nursing Notes Reviewed/ Care Coordinated, and agree without changes. Applicable Imaging Reviewed.  Interpretation of Laboratory Data incorporated into ED treatment  Plan: Admit    Flint Melter, MD 05/29/13 1941

## 2013-05-28 NOTE — ED Notes (Signed)
MD at bedside.  Cardiologist.

## 2013-05-28 NOTE — Progress Notes (Signed)
PHARMACIST - PHYSICIAN COMMUNICATION DR:  Katrinka BlazingSmith CONCERNING:  METFORMIN SAFE ADMINISTRATION POLICY  RECOMMENDATION: Metformin has been placed on DISCONTINUE (rejected order) STATUS and should be reordered only after any of the conditions below are ruled out.  Current safety recommendations include avoiding metformin for a minimum of 48 hours after the patient's exposure to intravenous contrast media.  DESCRIPTION:  The Pharmacy Committee has adopted a policy that restricts the use of metformin in hospitalized patients until all the contraindications to administration have been ruled out. Specific contraindications are: []  Serum creatinine ? 1.5 for males []  Serum creatinine ? 1.4 for females []  Shock, acute MI, sepsis, hypoxemia, dehydration [x]  Planned administration of intravenous iodinated contrast media []  Heart Failure patients with low EF []  Acute or chronic metabolic acidosis (including DKA)     Tad MooreJessica Brittni Hult, Pharm D, BCPS  Clinical Pharmacist Pager [phone]  05/28/2013 9:31 PM

## 2013-05-28 NOTE — ED Notes (Signed)
MD at bedside. 

## 2013-05-29 ENCOUNTER — Encounter (HOSPITAL_COMMUNITY)
Admission: EM | Disposition: A | Discharge status: Home / Self Care | Payer: Self-pay | Source: Home / Self Care | Attending: Cardiology

## 2013-05-29 DIAGNOSIS — I2510 Atherosclerotic heart disease of native coronary artery without angina pectoris: Secondary | ICD-10-CM

## 2013-05-29 HISTORY — PX: LEFT HEART CATHETERIZATION WITH CORONARY/GRAFT ANGIOGRAM: SHX5450

## 2013-05-29 LAB — CBC WITH DIFFERENTIAL/PLATELET
BASOS PCT: 0 % (ref 0–1)
Basophils Absolute: 0 10*3/uL (ref 0.0–0.1)
EOS ABS: 0.1 10*3/uL (ref 0.0–0.7)
EOS PCT: 1 % (ref 0–5)
HCT: 35.9 % — ABNORMAL LOW (ref 36.0–46.0)
Hemoglobin: 12.1 g/dL (ref 12.0–15.0)
Lymphocytes Relative: 43 % (ref 12–46)
Lymphs Abs: 3 10*3/uL (ref 0.7–4.0)
MCH: 30.1 pg (ref 26.0–34.0)
MCHC: 33.7 g/dL (ref 30.0–36.0)
MCV: 89.3 fL (ref 78.0–100.0)
Monocytes Absolute: 0.6 10*3/uL (ref 0.1–1.0)
Monocytes Relative: 8 % (ref 3–12)
Neutro Abs: 3.2 10*3/uL (ref 1.7–7.7)
Neutrophils Relative %: 47 % (ref 43–77)
PLATELETS: 197 10*3/uL (ref 150–400)
RBC: 4.02 MIL/uL (ref 3.87–5.11)
RDW: 12.9 % (ref 11.5–15.5)
WBC: 6.8 10*3/uL (ref 4.0–10.5)

## 2013-05-29 LAB — COMPREHENSIVE METABOLIC PANEL
ALT: 12 U/L (ref 0–35)
AST: 19 U/L (ref 0–37)
Albumin: 3.7 g/dL (ref 3.5–5.2)
Alkaline Phosphatase: 69 U/L (ref 39–117)
BUN: 18 mg/dL (ref 6–23)
CALCIUM: 9.2 mg/dL (ref 8.4–10.5)
CO2: 25 meq/L (ref 19–32)
CREATININE: 0.94 mg/dL (ref 0.50–1.10)
Chloride: 97 mEq/L (ref 96–112)
GFR calc Af Amer: 64 mL/min — ABNORMAL LOW (ref >90)
GFR, EST NON AFRICAN AMERICAN: 55 mL/min — AB (ref >90)
Glucose, Bld: 195 mg/dL — ABNORMAL HIGH (ref 70–99)
Potassium: 3.8 mEq/L (ref 3.7–5.3)
Sodium: 138 mEq/L (ref 137–147)
TOTAL PROTEIN: 6.9 g/dL (ref 6.0–8.3)
Total Bilirubin: 0.6 mg/dL (ref 0.3–1.2)

## 2013-05-29 LAB — T4, FREE: Free T4: 1.35 ng/dL (ref 0.80–1.80)

## 2013-05-29 LAB — GLUCOSE, CAPILLARY
GLUCOSE-CAPILLARY: 164 mg/dL — AB (ref 70–99)
Glucose-Capillary: 220 mg/dL — ABNORMAL HIGH (ref 70–99)
Glucose-Capillary: 216 mg/dL — ABNORMAL HIGH (ref 70–99)
Glucose-Capillary: 105 mg/dL — ABNORMAL HIGH (ref 70–99)

## 2013-05-29 LAB — TROPONIN I: Troponin I: <0.3 ng/mL (ref <0.30)

## 2013-05-29 LAB — BASIC METABOLIC PANEL
BUN: 18 mg/dL (ref 6–23)
CALCIUM: 9.1 mg/dL (ref 8.4–10.5)
CHLORIDE: 99 meq/L (ref 96–112)
CO2: 24 mEq/L (ref 19–32)
Creatinine, Ser: 0.95 mg/dL (ref 0.50–1.10)
GFR calc non Af Amer: 54 mL/min — ABNORMAL LOW (ref >90)
GFR, EST AFRICAN AMERICAN: 63 mL/min — AB (ref >90)
Glucose, Bld: 184 mg/dL — ABNORMAL HIGH (ref 70–99)
Potassium: 4 mEq/L (ref 3.7–5.3)
SODIUM: 137 meq/L (ref 137–147)

## 2013-05-29 LAB — CBC
HCT: 34.9 % — ABNORMAL LOW (ref 36.0–46.0)
Hemoglobin: 11.8 g/dL — ABNORMAL LOW (ref 12.0–15.0)
MCH: 30.4 pg (ref 26.0–34.0)
MCHC: 33.8 g/dL (ref 30.0–36.0)
MCV: 89.9 fL (ref 78.0–100.0)
PLATELETS: 198 10*3/uL (ref 150–400)
RBC: 3.88 MIL/uL (ref 3.87–5.11)
RDW: 13.1 % (ref 11.5–15.5)
WBC: 7.5 10*3/uL (ref 4.0–10.5)

## 2013-05-29 LAB — HEMOGLOBIN A1C
HEMOGLOBIN A1C: 8.8 % — AB (ref <5.7)
HEMOGLOBIN A1C: 8.9 % — AB (ref <5.7)
MEAN PLASMA GLUCOSE: 206 mg/dL — AB (ref <117)
MEAN PLASMA GLUCOSE: 209 mg/dL — AB (ref <117)

## 2013-05-29 LAB — MAGNESIUM: Magnesium: 1.1 mg/dL — ABNORMAL LOW (ref 1.5–2.5)

## 2013-05-29 LAB — MRSA PCR SCREENING: MRSA by PCR: NEGATIVE

## 2013-05-29 SURGERY — LEFT HEART CATHETERIZATION WITH CORONARY/GRAFT ANGIOGRAM
Anesthesia: LOCAL

## 2013-05-29 MED ORDER — NITROGLYCERIN IN D5W 200-5 MCG/ML-% IV SOLN: 2.0000 ug/min | INTRAVENOUS | Status: DC

## 2013-05-29 MED ORDER — INSULIN ASPART 100 UNIT/ML ~~LOC~~ SOLN
0.0000 [IU] | Freq: Three times a day (TID) | SUBCUTANEOUS | Status: DC
  Administered 2013-05-29: 3 [IU] via SUBCUTANEOUS

## 2013-05-29 MED ORDER — GI COCKTAIL ~~LOC~~
30.0000 mL | Freq: Once | ORAL | Status: AC
  Administered 2013-05-29: 30 mL via ORAL
  Filled 2013-05-29: qty 30

## 2013-05-29 MED ORDER — SODIUM CHLORIDE 0.9 % IV SOLN
INTRAVENOUS | Status: AC
  Administered 2013-05-29: 16:00:00 via INTRAVENOUS

## 2013-05-29 MED ORDER — VERAPAMIL HCL 2.5 MG/ML IV SOLN
INTRAVENOUS | Status: AC
  Filled 2013-05-29: qty 2

## 2013-05-29 MED FILL — Aspirin Chew Tab 81 MG: ORAL | Qty: 1 | Status: AC

## 2013-05-29 NOTE — Progress Notes (Signed)
Not coming back in 2H18, belongings taken by daughter as per Diplomatic Services operational officersecretary.

## 2013-05-29 NOTE — H&P (View-Only) (Signed)
       Patient Name: Clement HusbandsShirley Capece Date of Encounter: 05/29/2013    SUBJECTIVE: Now having burning upper sternal and throat discomfort. She feels this is due to indigestion. At some point last evening after starting IV nitroglycerin the lower chest tightness are completely resolved. Her EKG and creatinine markers unremarkable. She seems to have a memory deficit.  TELEMETRY:  Normal sinus rhythm.         ECG: Normal sinus rhythm with anterior nonspecific T-wave abnormality Filed Vitals:   05/29/13 0418 05/29/13 0500 05/29/13 0715 05/29/13 1113  BP: 102/57  128/59 105/58  Pulse: 78  81 72  Temp: 98.6 F (37 C)  98.5 F (36.9 C) 98.4 F (36.9 C)  TempSrc: Oral  Oral Oral  Resp: 15  13 19   Height:      Weight:  142 lb 10.2 oz (64.7 kg)    SpO2: 99%  99% 97%    Intake/Output Summary (Last 24 hours) at 05/29/13 1156 Last data filed at 05/29/13 1000  Gross per 24 hour  Intake 728.85 ml  Output   1000 ml  Net -271.15 ml    LABS: Basic Metabolic Panel:  Recent Labs  09/81/1903/18/15 2219 05/29/13 0245  NA 138 137  K 3.8 4.0  CL 97 99  CO2 25 24  GLUCOSE 195* 184*  BUN 18 18  CREATININE 0.94 0.95  CALCIUM 9.2 9.1  MG 1.1*  --    CBC:  Recent Labs  05/28/13 2219 05/29/13 0245  WBC 6.8 7.5  NEUTROABS 3.2  --   HGB 12.1 11.8*  HCT 35.9* 34.9*  MCV 89.3 89.9  PLT 197 198   Cardiac Enzymes:  Recent Labs  05/28/13 2219 05/29/13 0245  TROPONINI <0.30 <0.30    Radiology/Studies:  No acute abnormality  Physical Exam: Blood pressure 105/58, pulse 72, temperature 98.4 F (36.9 C), temperature source Oral, resp. rate 19, height 5\' 7"  (1.702 m), weight 142 lb 10.2 oz (64.7 kg), SpO2 97.00%. Weight change:    No pericardial friction rub Chest is clear to auscultation and percussion Pulses are symmetric in the upper and lower extremities  ASSESSMENT:  1. Clinical syndrome compatible with unstable angina pectoris in a patient with prior coronary bypass grafting  greater than 15 years ago. Suspect bypass graft occlusion. 2. Memory deficit 3. Diabetes mellitus 4. Hypertension 5. Possible reflux  Plan:  We discussed the indication for coronary angiography. We discussed the risk of the procedure. The patient is willing to proceed. We'll get a sliding scale for management of diabetes. We have given Mylanta to treat possible reflux symptoms this morning.  Selinda EonSigned, Arick Mareno III,Delontae Lamm W 05/29/2013, 11:56 AM

## 2013-05-29 NOTE — Progress Notes (Signed)
To the cath lab by bed, stable. 

## 2013-05-29 NOTE — Progress Notes (Signed)
       Patient Name: [name] Date of Encounter: 05/29/2013    SUBJECTIVE: Now having burning upper sternal and throat discomfort. She feels this is due to indigestion. At some point last evening after starting IV nitroglycerin the lower chest tightness are completely resolved. Her EKG and creatinine markers unremarkable. She seems to have a memory deficit.  TELEMETRY:  Normal sinus rhythm.         ECG: Normal sinus rhythm with anterior nonspecific T-wave abnormality Filed Vitals:   05/29/13 0418 05/29/13 0500 05/29/13 0715 05/29/13 1113  BP: 102/57  128/59 105/58  Pulse: 78  81 72  Temp: 98.6 F (37 C)  98.5 F (36.9 C) 98.4 F (36.9 C)  TempSrc: Oral  Oral Oral  Resp: 15  13 19  Height:      Weight:  142 lb 10.2 oz (64.7 kg)    SpO2: 99%  99% 97%    Intake/Output Summary (Last 24 hours) at 05/29/13 1156 Last data filed at 05/29/13 1000  Gross per 24 hour  Intake 728.85 ml  Output   1000 ml  Net -271.15 ml    LABS: Basic Metabolic Panel:  Recent Labs  05/28/13 2219 05/29/13 0245  NA 138 137  K 3.8 4.0  CL 97 99  CO2 25 24  GLUCOSE 195* 184*  BUN 18 18  CREATININE 0.94 0.95  CALCIUM 9.2 9.1  MG 1.1*  --    CBC:  Recent Labs  05/28/13 2219 05/29/13 0245  WBC 6.8 7.5  NEUTROABS 3.2  --   HGB 12.1 11.8*  HCT 35.9* 34.9*  MCV 89.3 89.9  PLT 197 198   Cardiac Enzymes:  Recent Labs  05/28/13 2219 05/29/13 0245  TROPONINI <0.30 <0.30    Radiology/Studies:  No acute abnormality  Physical Exam: Blood pressure 105/58, pulse 72, temperature 98.4 F (36.9 C), temperature source Oral, resp. rate 19, height 5' 7" (1.702 m), weight 142 lb 10.2 oz (64.7 kg), SpO2 97.00%. Weight change:    No pericardial friction rub Chest is clear to auscultation and percussion Pulses are symmetric in the upper and lower extremities  ASSESSMENT:  1. Clinical syndrome compatible with unstable angina pectoris in a patient with prior coronary bypass grafting  greater than 15 years ago. Suspect bypass graft occlusion. 2. Memory deficit 3. Diabetes mellitus 4. Hypertension 5. Possible reflux  Plan:  We discussed the indication for coronary angiography. We discussed the risk of the procedure. The patient is willing to proceed. We'll get a sliding scale for management of diabetes. We have given Mylanta to treat possible reflux symptoms this morning.  Signed, Reyhan Moronta III,Eero Dini W 05/29/2013, 11:56 AM 

## 2013-05-29 NOTE — Progress Notes (Signed)
Inpatient Diabetes Program Recommendations  AACE/ADA: New Consensus Statement on Inpatient Glycemic Control (2013)  Target Ranges:  Prepandial:   less than 140 mg/dL      Peak postprandial:   less than 180 mg/dL (1-2 hours)      Critically ill patients:  140 - 180 mg/dL  Results for [name], Candies (MRN [id number]) as of 05/29/2013 11:21  Ref. Range 05/28/2013 22:18 05/29/2013 08:26  Glucose-Capillary Latest Range: 70-99 mg/dL 409209 (H) 811220 (H)   Inpatient Diabetes Program Recommendations Correction (SSI): consider starting Novolog sensitive scale TID per Glycemic Control order-set Thank you  Piedad ClimesGina Juancarlos Crescenzo BSN, RN,CDE Inpatient Diabetes Coordinator [phone] (team pager)

## 2013-05-29 NOTE — CV Procedure (Signed)
     Cardiac Catheterization Operative Report  Sara HusbandsShirley [name] [id number] 3/19/20153:12 PM Gaye AlkenBARNES,ELIZABETH STEWART, MD  Procedure Performed:  1. Left Heart Catheterization 2. Selective Coronary Angiography 3. SVG angiography 4. LIMA graft angiography 5. Left ventricular angiogram  Operator: Verne Carrowhristopher McAlhany, MD  Indication: 80 yo female with history of CAD s/p 4V CABG 2000, DM, HTN admitted with chest pain. Negative cardiac markers.                                     Procedure Details: The risks, benefits, complications, treatment options, and expected outcomes were discussed with the patient. The patient and/or family concurred with the proposed plan, giving informed consent. The patient was brought to the cath lab after IV hydration was begun and oral premedication was given. The patient was not sedated. The right groin was prepped and draped in the usual manner. Using the modified Seldinger access technique, a 5 French sheath was placed in the right femoral artery. Standard diagnostic catheters were used to perform selective coronary angiography. The JR4 was used to engage the native RCA, SVG to PDA and SVG to Diagonal. An AL-1 was used to engage the SVG to the marginal. An IMA catheter was used to engage the LIMA graft to the LAD. A pigtail catheter was used to perform a left ventricular angiogram.  There were no immediate complications. The patient was taken to the recovery area in stable condition.   Hemodynamic Findings: Central aortic pressure: 92/44 Left ventricular pressure: 103/0/9  Angiographic Findings:  Left main: Diffuse 20% stenosis.   Left Anterior Descending Artery: Large caliber vessel that courses to the apex. There is 100% mid occlusion. The mid and distal vessel fills from the patent IM graft. There is a small to moderate caliber diagonal branch with 99% proximal stenosis. This diagonal fills from the patent vein graft.   Circumflex Artery: Moderate  caliber vessel with small caliber first obtuse marginal branch and moderate caliber second obtuse marginal branch. The first obtuse marginal branch is patent with 80% ostial stenosis. The second obtuse marginal branch is occluded and fills from the patent vein graft.   Right Coronary Artery: Large caliber dominant vessel with 50% proximal stenosis leading into the diffusely diseased mid vessel. 100% occlusion in the mid vessel. The distal vessel fills from the patent vein graft.   Left Ventricular Angiogram: LVEF=55-60%  Impression: 1. Severe triple vessel CAD s/p 4V CABG with 4/4 patent bypass grafts.  2. Normal LV systolic function  Recommendations: Her grafts are patent and native vessel disease is overall unchanged from last cath in 2009. Continue medical management. Plan d/c home tomorrow am.        Complications:  None. The patient tolerated the procedure well.

## 2013-05-29 NOTE — Interval H&P Note (Signed)
History and Physical Interval Note:  05/29/2013 2:18 PM  Sara HusbandsShirley [name]  has presented today for cardiac cath with the diagnosis of CP, known CAD s/p CABG.   The various methods of treatment have been discussed with the patient and family. After consideration of risks, benefits and other options for treatment, the patient has consented to  Procedure(s): LEFT HEART CATHETERIZATION WITH CORONARY/GRAFT ANGIOGRAM (N/A) as a surgical intervention .  The patient's history has been reviewed, patient examined, no change in status, stable for surgery.  I have reviewed the patient's chart and labs.  Questions were answered to the patient's satisfaction.    Cath Lab Visit (complete for each Cath Lab visit)  Clinical Evaluation Leading to the Procedure:   ACS: no  Non-ACS:    Anginal Classification: CCS III  Anti-ischemic medical therapy: Minimal Therapy (1 class of medications)  Non-Invasive Test Results: No non-invasive testing performed  Prior CABG: Previous CABG         Dameer Speiser

## 2013-05-29 NOTE — Progress Notes (Signed)
Complained of epigastric pain , MD aware, gi cocktail given with relief.

## 2013-05-30 LAB — BASIC METABOLIC PANEL
BUN: 16 mg/dL (ref 6–23)
CHLORIDE: 100 meq/L (ref 96–112)
CO2: 26 meq/L (ref 19–32)
Calcium: 8.7 mg/dL (ref 8.4–10.5)
Creatinine, Ser: 0.99 mg/dL (ref 0.50–1.10)
GFR calc Af Amer: 60 mL/min — ABNORMAL LOW (ref >90)
GFR calc non Af Amer: 52 mL/min — ABNORMAL LOW (ref >90)
Glucose, Bld: 143 mg/dL — ABNORMAL HIGH (ref 70–99)
POTASSIUM: 3.7 meq/L (ref 3.7–5.3)
SODIUM: 139 meq/L (ref 137–147)

## 2013-05-30 LAB — GLUCOSE, CAPILLARY
GLUCOSE-CAPILLARY: 119 mg/dL — AB (ref 70–99)
GLUCOSE-CAPILLARY: 165 mg/dL — AB (ref 70–99)

## 2013-05-30 MED ORDER — NITROGLYCERIN 0.4 MG SL SUBL: 0.4000 mg | SUBLINGUAL_TABLET | SUBLINGUAL | Status: DC | PRN

## 2013-05-30 MED ORDER — AMLODIPINE BESYLATE 5 MG PO TABS: 5.0000 mg | ORAL_TABLET | Freq: Every day | ORAL | Status: DC

## 2013-05-30 MED ORDER — ISOSORBIDE MONONITRATE 15 MG HALF TABLET: 15.0000 mg | ORAL_TABLET | Freq: Every day | ORAL | Status: DC

## 2013-05-30 MED ORDER — ISOSORBIDE MONONITRATE 15 MG HALF TABLET
15.0000 mg | ORAL_TABLET | Freq: Every day | ORAL | Status: DC
  Administered 2013-05-30: 15 mg via ORAL
  Filled 2013-05-30: qty 1

## 2013-05-30 MED ORDER — METOPROLOL SUCCINATE ER 25 MG PO TB24: 25.0000 mg | ORAL_TABLET | Freq: Every day | ORAL | Status: DC

## 2013-05-30 MED ORDER — METFORMIN HCL 500 MG PO TABS: 500.0000 mg | ORAL_TABLET | Freq: Two times a day (BID) | ORAL | Status: DC

## 2013-05-30 NOTE — Discharge Summary (Signed)
Patient ID: [name],  MRN: [id number], DOB/AGE: Jan 30, 1929 80 y.o.  Admit date: 05/28/2013 Discharge date: 05/30/2013  Primary Care Provider: Leighton Ruff, M.D. Primary Cardiologist: Dr Veatrice Bourbon  Discharge Diagnoses Principal Problem:   Chest pain with high risk of acute coronary syndrome Active Problems:   CAD- CABG '00. Cath in '09 and 05/29/13- patent grafts   Hypertension   DM (diabetes mellitus)   Dyslipidemia   Depression    Procedures: Coronary angiogram 05/29/13   Hospital Course: 80 year old female with history of prior coronary bypass grafting in 2000 with LIMA to LAD, SVG to diagonal, SVG to OM, and SVG to PDA. Last cath in 2009- medical Rx. Last LVEF in 2011 was 65%. The pt was admitted 05/28/13 with chest pain worrisome for [place]. She was admitted and ruled out for an MI. Cath done 05/29/13 revealed that her grafts were patent and native vessel disease was overall unchanged from last cath in 2009. Plan is for continued medical management. Her B/P was a little soft. Her Norvasc was decreased and Toprol and low dose Imdur were added. We feel she is stable for discharge 05/30/13.     Discharge Vitals:  Blood pressure 107/46, pulse 70, temperature 99 F (37.2 C), temperature source Oral, resp. rate 18, height 5' 7" (1.702 m), weight 143 lb 3.2 oz (64.955 kg), SpO2 94.00%.    Labs: Results for orders placed during the hospital encounter of 05/28/13 (from the past 48 hour(s))  CBC     Status: None   Collection Time    05/28/13 12:00 PM      Result Value Ref Range   WBC 7.6  4.0 - 10.5 K/uL   RBC 4.23  3.87 - 5.11 MIL/uL   Hemoglobin 12.8  12.0 - 15.0 g/dL   HCT 37.9  36.0 - 46.0 %   MCV 89.6  78.0 - 100.0 fL   MCH 30.3  26.0 - 34.0 pg   MCHC 33.8  30.0 - 36.0 g/dL   RDW 12.9  11.5 - 15.5 %   Platelets 200  150 - 400 K/uL  BASIC METABOLIC PANEL     Status: Abnormal   Collection Time    05/28/13 12:00 PM      Result Value Ref Range   Sodium 137  137 -  147 mEq/L   Potassium 4.8  3.7 - 5.3 mEq/L   Chloride 95 (*) 96 - 112 mEq/L   CO2 26  19 - 32 mEq/L   Glucose, Bld 261 (*) 70 - 99 mg/dL   BUN 21  6 - 23 mg/dL   Creatinine, Ser 0.97  0.50 - 1.10 mg/dL   Calcium 9.9  8.4 - 10.5 mg/dL   GFR calc non Af Amer 53 (*) >90 mL/min   GFR calc Af Amer 61 (*) >90 mL/min   Comment: (NOTE)     The eGFR has been calculated using the CKD EPI equation.     This calculation has not been validated in all clinical situations.     eGFR's persistently <90 mL/min signify possible Chronic Kidney     Disease.  PRO B NATRIURETIC PEPTIDE     Status: None   Collection Time    05/28/13 12:00 PM      Result Value Ref Range   Pro B Natriuretic peptide (BNP) 209.8  0 - 450 pg/mL  PROTIME-INR     Status: None   Collection Time    05/28/13 12:00 PM  Result Value Ref Range   Prothrombin Time 13.0  11.6 - 15.2 seconds   INR 1.00  0.00 - 1.49  I-STAT TROPOININ, ED     Status: None   Collection Time    05/28/13 12:15 PM      Result Value Ref Range   Troponin i, poc 0.01  0.00 - 0.08 ng/mL   Comment 3            Comment: Due to the release kinetics of cTnI,     a negative result within the first hours     of the onset of symptoms does not rule out     myocardial infarction with certainty.     If myocardial infarction is still suspected,     repeat the test at appropriate intervals.  MRSA PCR SCREENING     Status: None   Collection Time    05/28/13  9:17 PM      Result Value Ref Range   MRSA by PCR NEGATIVE  NEGATIVE   Comment:            The GeneXpert MRSA Assay (FDA     approved for NASAL specimens     only), is one component of a     comprehensive MRSA colonization     surveillance program. It is not     intended to diagnose MRSA     infection nor to guide or     monitor treatment for     MRSA infections.  GLUCOSE, CAPILLARY     Status: Abnormal   Collection Time    05/28/13 10:18 PM      Result Value Ref Range   Glucose-Capillary 209 (*)  70 - 99 mg/dL  TROPONIN I     Status: None   Collection Time    05/28/13 10:19 PM      Result Value Ref Range   Troponin I <0.30  <0.30 ng/mL   Comment:            Due to the release kinetics of cTnI,     a negative result within the first hours     of the onset of symptoms does not rule out     myocardial infarction with certainty.     If myocardial infarction is still suspected,     repeat the test at appropriate intervals.  CBC WITH DIFFERENTIAL     Status: Abnormal   Collection Time    05/28/13 10:19 PM      Result Value Ref Range   WBC 6.8  4.0 - 10.5 K/uL   RBC 4.02  3.87 - 5.11 MIL/uL   Hemoglobin 12.1  12.0 - 15.0 g/dL   HCT 35.9 (*) 36.0 - 46.0 %   MCV 89.3  78.0 - 100.0 fL   MCH 30.1  26.0 - 34.0 pg   MCHC 33.7  30.0 - 36.0 g/dL   RDW 12.9  11.5 - 15.5 %   Platelets 197  150 - 400 K/uL   Neutrophils Relative % 47  43 - 77 %   Neutro Abs 3.2  1.7 - 7.7 K/uL   Lymphocytes Relative 43  12 - 46 %   Lymphs Abs 3.0  0.7 - 4.0 K/uL   Monocytes Relative 8  3 - 12 %   Monocytes Absolute 0.6  0.1 - 1.0 K/uL   Eosinophils Relative 1  0 - 5 %   Eosinophils Absolute 0.1  0.0 - 0.7 K/uL   Basophils Relative  0  0 - 1 %   Basophils Absolute 0.0  0.0 - 0.1 K/uL  T4, FREE     Status: None   Collection Time    05/28/13 10:19 PM      Result Value Ref Range   Free T4 1.35  0.80 - 1.80 ng/dL   Comment: Performed at [facility]     Status: Abnormal   Collection Time    05/28/13 10:19 PM      Result Value Ref Range   Sodium 138  137 - 147 mEq/L   Potassium 3.8  3.7 - 5.3 mEq/L   Comment: DELTA CHECK NOTED   Chloride 97  96 - 112 mEq/L   CO2 25  19 - 32 mEq/L   Glucose, Bld 195 (*) 70 - 99 mg/dL   BUN 18  6 - 23 mg/dL   Creatinine, Ser 0.94  0.50 - 1.10 mg/dL   Calcium 9.2  8.4 - 10.5 mg/dL   Total Protein 6.9  6.0 - 8.3 g/dL   Albumin 3.7  3.5 - 5.2 g/dL   AST 19  0 - 37 U/L   ALT 12  0 - 35 U/L   Alkaline Phosphatase 69  39 - 117  U/L   Total Bilirubin 0.6  0.3 - 1.2 mg/dL   GFR calc non Af Amer 55 (*) >90 mL/min   GFR calc Af Amer 64 (*) >90 mL/min   Comment: (NOTE)     The eGFR has been calculated using the CKD EPI equation.     This calculation has not been validated in all clinical situations.     eGFR's persistently <90 mL/min signify possible Chronic Kidney     Disease.  MAGNESIUM     Status: Abnormal   Collection Time    05/28/13 10:19 PM      Result Value Ref Range   Magnesium 1.1 (*) 1.5 - 2.5 mg/dL  HEMOGLOBIN A1C     Status: Abnormal   Collection Time    05/28/13 10:19 PM      Result Value Ref Range   Hemoglobin A1C 8.8 (*) <5.7 %   Comment: (NOTE)                                                                               According to the ADA Clinical Practice Recommendations for 2011, when     HbA1c is used as a screening test:      >=6.5%   Diagnostic of Diabetes Mellitus               (if abnormal result is confirmed)     5.7-6.4%   Increased risk of developing Diabetes Mellitus     References:Diagnosis and Classification of Diabetes Mellitus,Diabetes     GQQP,6195,09(TOIZT 1):S62-S69 and Standards of Medical Care in             Diabetes - 2011,Diabetes Care,2011,34 (Suppl 1):S11-S61.   Mean Plasma Glucose 206 (*) <117 mg/dL   Comment: Performed at [facility]  CBC     Status: Abnormal   Collection Time    05/29/13  2:45 AM      Result  Value Ref Range   WBC 7.5  4.0 - 10.5 K/uL   RBC 3.88  3.87 - 5.11 MIL/uL   Hemoglobin 11.8 (*) 12.0 - 15.0 g/dL   HCT 34.9 (*) 36.0 - 46.0 %   MCV 89.9  78.0 - 100.0 fL   MCH 30.4  26.0 - 34.0 pg   MCHC 33.8  30.0 - 36.0 g/dL   RDW 13.1  11.5 - 15.5 %   Platelets 198  150 - 400 K/uL  BASIC METABOLIC PANEL     Status: Abnormal   Collection Time    05/29/13  2:45 AM      Result Value Ref Range   Sodium 137  137 - 147 mEq/L   Potassium 4.0  3.7 - 5.3 mEq/L   Chloride 99  96 - 112 mEq/L   CO2 24  19 - 32 mEq/L   Glucose, Bld 184 (*) 70 -  99 mg/dL   BUN 18  6 - 23 mg/dL   Creatinine, Ser 0.95  0.50 - 1.10 mg/dL   Calcium 9.1  8.4 - 10.5 mg/dL   GFR calc non Af Amer 54 (*) >90 mL/min   GFR calc Af Amer 63 (*) >90 mL/min   Comment: (NOTE)     The eGFR has been calculated using the CKD EPI equation.     This calculation has not been validated in all clinical situations.     eGFR's persistently <90 mL/min signify possible Chronic Kidney     Disease.  TROPONIN I     Status: None   Collection Time    05/29/13  2:45 AM      Result Value Ref Range   Troponin I <0.30  <0.30 ng/mL   Comment:            Due to the release kinetics of cTnI,     a negative result within the first hours     of the onset of symptoms does not rule out     myocardial infarction with certainty.     If myocardial infarction is still suspected,     repeat the test at appropriate intervals.  HEMOGLOBIN A1C     Status: Abnormal   Collection Time    05/29/13  2:45 AM      Result Value Ref Range   Hemoglobin A1C 8.9 (*) <5.7 %   Comment: (NOTE)                                                                               According to the ADA Clinical Practice Recommendations for 2011, when     HbA1c is used as a screening test:      >=6.5%   Diagnostic of Diabetes Mellitus               (if abnormal result is confirmed)     5.7-6.4%   Increased risk of developing Diabetes Mellitus     References:Diagnosis and Classification of Diabetes Mellitus,Diabetes     PRXY,5859,29(WKMQK 1):S62-S69 and Standards of Medical Care in             Diabetes - 2011,Diabetes Care,2011,34 (Suppl 1):S11-S61.   Mean Plasma Glucose 209 (*) <  117 mg/dL   Comment: Performed at [place], CAPILLARY     Status: Abnormal   Collection Time    05/29/13  8:26 AM      Result Value Ref Range   Glucose-Capillary 220 (*) 70 - 99 mg/dL  TROPONIN I     Status: None   Collection Time    05/29/13  9:51 AM      Result Value Ref Range   Troponin I <0.30  <0.30  ng/mL   Comment:            Due to the release kinetics of cTnI,     a negative result within the first hours     of the onset of symptoms does not rule out     myocardial infarction with certainty.     If myocardial infarction is still suspected,     repeat the test at appropriate intervals.  GLUCOSE, CAPILLARY     Status: Abnormal   Collection Time    05/29/13 11:16 AM      Result Value Ref Range   Glucose-Capillary 216 (*) 70 - 99 mg/dL  GLUCOSE, CAPILLARY     Status: Abnormal   Collection Time    05/29/13  4:50 PM      Result Value Ref Range   Glucose-Capillary 105 (*) 70 - 99 mg/dL   Comment 1 Documented in Chart     Comment 2 Notify RN    GLUCOSE, CAPILLARY     Status: Abnormal   Collection Time    05/29/13  9:43 PM      Result Value Ref Range   Glucose-Capillary 164 (*) 70 - 99 mg/dL  BASIC METABOLIC PANEL     Status: Abnormal   Collection Time    05/30/13  5:43 AM      Result Value Ref Range   Sodium 139  137 - 147 mEq/L   Potassium 3.7  3.7 - 5.3 mEq/L   Chloride 100  96 - 112 mEq/L   CO2 26  19 - 32 mEq/L   Glucose, Bld 143 (*) 70 - 99 mg/dL   BUN 16  6 - 23 mg/dL   Creatinine, Ser 0.99  0.50 - 1.10 mg/dL   Calcium 8.7  8.4 - 10.5 mg/dL   GFR calc non Af Amer 52 (*) >90 mL/min   GFR calc Af Amer 60 (*) >90 mL/min   Comment: (NOTE)     The eGFR has been calculated using the CKD EPI equation.     This calculation has not been validated in all clinical situations.     eGFR's persistently <90 mL/min signify possible Chronic Kidney     Disease.  GLUCOSE, CAPILLARY     Status: Abnormal   Collection Time    05/30/13  7:46 AM      Result Value Ref Range   Glucose-Capillary 165 (*) 70 - 99 mg/dL   Comment 1 Documented in Chart     Comment 2 Notify RN      Disposition:  Follow-up Information   Follow up with Richardson Dopp, PA-C On 06/12/2013. (8:30am)    Specialty:  Physician Assistant   Contact information:   [address]  [address] [phone]       Discharge Medications:    Medication List         amLODipine 5 MG tablet  Commonly known as:  NORVASC  Take 1 tablet (5 mg total) by mouth  daily.     aspirin 81 MG tablet  Take 81 mg by mouth daily.     atorvastatin 20 MG tablet  Commonly known as:  LIPITOR  Take 1 tablet (20 mg total) by mouth daily.     clopidogrel 75 MG tablet  Commonly known as:  PLAVIX  Take 1 tablet (75 mg total) by mouth daily. Continue on hold for 2 more weeks.     DULoxetine 60 MG capsule  Commonly known as:  CYMBALTA  Take 60 mg by mouth daily.     feeding supplement (ENSURE) Pudg  Take 1 Container by mouth 2 (two) times daily with a meal.     feeding supplement (ENSURE COMPLETE) Liqd  Take 237 mLs by mouth 2 (two) times daily between meals.     gabapentin 100 MG capsule  Commonly known as:  NEURONTIN  Take 200 mg by mouth 2 (two) times daily.     glimepiride 2 MG tablet  Commonly known as:  AMARYL  Take 2 mg by mouth daily with breakfast.     isosorbide mononitrate 15 mg Tb24 24 hr tablet  Commonly known as:  IMDUR  Take 0.5 tablets (15 mg total) by mouth daily.     LORazepam 0.5 MG tablet  Commonly known as:  ATIVAN  Take 1 tablet (0.5 mg total) by mouth 2 (two) times daily.     metFORMIN 500 MG tablet  Commonly known as:  GLUCOPHAGE  Take 1 tablet (500 mg total) by mouth 2 (two) times daily with a meal. Take 2 tabs in the AM and 1 tab in the PM  Start taking on:  06/01/2013     metoprolol succinate 25 MG 24 hr tablet  Commonly known as:  TOPROL-XL  Take 1 tablet (25 mg total) by mouth daily.     nitroGLYCERIN 0.4 MG SL tablet  Commonly known as:  NITROSTAT  Place 1 tablet (0.4 mg total) under the tongue every 5 (five) minutes as needed. For chest pain     nitroGLYCERIN 0.4 MG SL tablet  Commonly known as:  NITROSTAT  Place 1 tablet (0.4 mg total) under the tongue every 5 (five) minutes x 3 doses as needed for chest pain.     sitaGLIPtin 100 MG  tablet  Commonly known as:  JANUVIA  Take 100 mg by mouth daily.         Duration of Discharge Encounter: Greater than 30 minutes including physician time.  Angelena Form PA-C 05/30/2013 8:40 AM

## 2013-05-30 NOTE — Discharge Instructions (Signed)
Coronary Angiography With Stent, Care After °Refer to this sheet in the next few weeks. These instructions provide you with information on caring for yourself after your procedure. Your health care provider may also give you more specific instructions. Your treatment has been planned according to current medical practices, but problems sometimes occur. Call your health care provider if you have any problems or questions after your procedure.  °WHAT TO EXPECT AFTER THE PROCEDURE  °The insertion site may be tender for a few days after your procedure. °HOME CARE INSTRUCTIONS  °· Only take over-the-counter or prescription medicines as directed by your health care provider. Take all medicines exactly as instructed. Blood thinners may be prescribed after your procedure to improve blood flow through the stent. °· Change any bandages (dressings) as directed by your health care provider.   °· Check your insertion site every day for redness, swelling, or fluid leaking from the insertion.   °· You may take showers. Pat the insertion area dry. Do not rub the insertion area with a washcloth or towel.   °· Eat a heart-healthy diet. This should include plenty of fresh fruits and vegetables. Meat should be lean cuts. Avoid the following types of food:   °· Food that is high in salt.   °· Canned or highly processed food.   °· Food that is high in saturated fat or sugar.   °· Fried food.   °· Make any other lifestyle changes recommended by your health care provider. This may include:   °· Quitting smoking.   °· Managing your weight.   °· Getting regular exercise.   °· Managing your blood pressure.   °· Limiting your alcohol intake.   °· Managing other health problems, such as diabetes.   °· If you need an MRI after your heart stent was placed, be sure to tell the health care provider who orders the MRI that you have a heart stent.   °· Follow up with your health care provider as directed.   °SEEK IMMEDIATE MEDICAL CARE IF:  °· You  develop chest pain, shortness of breath, feel faint, or pass out. °· You have bleeding, swelling larger than a walnut, or drainage from the catheter insertion site. °· You develop pain, discoloration, coldness, or severe bruising in the leg or arm that held the catheter. °· You develop bleeding from any other place such as from the bowels. There may be bright red blood in the urine or stools, or it may appear as black, tarry stools. °· You have a fever or chills. °MAKE SURE YOU: °· Understand these instructions. °· Will watch your condition. °· Will get help right away if you are not doing well or get worse. °Document Released: 09/16/2004 Document Revised: 10/30/2012 Document Reviewed: 07/31/2012 °ExitCare® Patient Information ©2014 [employer]. ° °

## 2013-05-30 NOTE — Progress Notes (Signed)
    Subjective:  She denies chest pain  Objective:  Vital Signs in the last 24 hours: Temp:  [98.2 F (36.8 C)-99 F (37.2 C)] 99 F (37.2 C) (03/20 0534) Pulse Rate:  [67-78] 70 (03/20 0534) Resp:  [18-19] 18 (03/19 1610) BP: (93-120)/(46-62) 107/46 mmHg (03/20 0534) SpO2:  [94 %-97 %] 94 % (03/20 0534) Weight:  [140 lb (63.504 kg)-143 lb 3.2 oz (64.955 kg)] 143 lb 3.2 oz (64.955 kg) (03/20 0534)  Intake/Output from previous day:  Intake/Output Summary (Last 24 hours) at 05/30/13 0752 Last data filed at 05/29/13 1000  Gross per 24 hour  Intake    166 ml  Output      0 ml  Net    166 ml    Physical Exam: General appearance: alert, cooperative, no distress and mildly obese Lungs: clear to auscultation bilaterally Heart: regular rate and rhythm and 2/6 systolic murmur AOv and LSB Extremities: Rt groin without hematoma   Rate: 70  Rhythm: normal sinus rhythm and 1st degree AVB  Lab Results:  Recent Labs  05/28/13 2219 05/29/13 0245  WBC 6.8 7.5  HGB 12.1 11.8*  PLT 197 198    Recent Labs  05/29/13 0245 05/30/13 0543  NA 137 139  K 4.0 3.7  CL 99 100  CO2 24 26  GLUCOSE 184* 143*  BUN 18 16  CREATININE 0.95 0.99    Recent Labs  05/29/13 0245 05/29/13 0951  TROPONINI <0.30 <0.30    Recent Labs  05/28/13 1200  INR 1.00    Imaging: Imaging results have been reviewed  Cardiac Studies:  Assessment/Plan:   Principal Problem:   Chest pain with high risk of acute coronary syndrome Active Problems:   CAD- CABG '00. Cath in '09 and 05/29/13- patent grafts   Hypertension   DM (diabetes mellitus)   Dyslipidemia   Depression    PLAN: ? Add low dose Imdur- her only anti anginal is Toprol. Will review home situation before discharge.   Corine ShelterLuke Kilroy PA-C [facility] [phone] 05/30/2013, 7:52 AM Agree with above assessment.  Patient feels well today.  Denies chest pain.  Okay for discharge today.  We will add isosorbide mononitrate 15 mg each  morning.  Follow up with Dr. Myrtis SerKatz in the office.

## 2013-06-02 ENCOUNTER — Telehealth: Payer: Self-pay | Admitting: Cardiology

## 2013-06-02 NOTE — Telephone Encounter (Signed)
Returned call and pager number given to contact PA for clarification.

## 2013-06-02 NOTE — Telephone Encounter (Signed)
Please call question about her Imdur  daily dosage and the prescription was not signed.

## 2013-06-12 ENCOUNTER — Encounter: Payer: Medicare Other | Admitting: Physician Assistant

## 2013-06-26 ENCOUNTER — Encounter: Payer: Self-pay | Admitting: Physician Assistant

## 2013-06-26 ENCOUNTER — Ambulatory Visit (INDEPENDENT_AMBULATORY_CARE_PROVIDER_SITE_OTHER): Payer: Medicare Other | Admitting: Physician Assistant

## 2013-06-26 VITALS — BP 100/60 | HR 62 | Ht 67.0 in | Wt 144.0 lb

## 2013-06-26 DIAGNOSIS — R079 Chest pain, unspecified: Secondary | ICD-10-CM

## 2013-06-26 DIAGNOSIS — I951 Orthostatic hypotension: Secondary | ICD-10-CM

## 2013-06-26 DIAGNOSIS — I2510 Atherosclerotic heart disease of native coronary artery without angina pectoris: Secondary | ICD-10-CM

## 2013-06-26 DIAGNOSIS — R0602 Shortness of breath: Secondary | ICD-10-CM

## 2013-06-26 DIAGNOSIS — E785 Hyperlipidemia, unspecified: Secondary | ICD-10-CM

## 2013-06-26 DIAGNOSIS — I10 Essential (primary) hypertension: Secondary | ICD-10-CM

## 2013-06-26 MED ORDER — PANTOPRAZOLE SODIUM 40 MG PO TBEC: 40.0000 mg | DELAYED_RELEASE_TABLET | Freq: Every day | ORAL | Status: DC

## 2013-06-26 NOTE — Progress Notes (Signed)
[place] [place], [place]  [address] Phone: [phone] Fax:  [phone]  Date:  06/26/2013   ID:  [name], DOB [birth year], MRN [id number]  PCP:  Gerrit Heck, MD  Cardiologist:  Dr. Cleatis Polka     History of Present Illness: [name] is a 80 y.o. female with a hx of CAD s/p CABG, HTN ,HL, diabetes.  She was recently admitted 3/18-3/20 with complaints of chest pain concerning for unstable angina. Cardiac markers remained normal. Cardiac catheterization was performed. This demonstrated 4/4 patent bypass grafts. Continued medical therapy was recommended. She was placed on Toprol and low-dose isosorbide prior to discharge.  She returns with her daughter.  She notes significant orthostatic intol. She has had this for years.  She denies syncope.  She thinks it may be worse since starting her new medications.  She continues to note occasional chest pain.  This is not related to exertion or assoc with other symptoms.  She also notes DOE.  She is probably NYHA Class 2b-3.  No orthopnea, PND, edema.  No cough. No wheezing.  She denies dyspnea like this prior to going to the hospital.   Studies:  - LHC (05/28/13):  LM 20, mLAD 100, pD1 99, oOM1 80, OM2 100, pRCA 50, mRCA 100, L-LAD ok, S-Dx ok, S-OM2 ok, S-RCA ok; EF 55-60%,   - Echo (01/2010):  EF 65-70%, normal wall motion, grade 1 diastolic dysfunction, mild AI  - Carotid US (11/2011):  No ICA stenosis.   Recent Labs: 05/28/2013: ALT 12; Pro B Natriuretic peptide (BNP) 209.8  05/29/2013: Hemoglobin 11.8*  05/30/2013: Creatinine 0.99; Potassium 3.7   Wt Readings from Last 3 Encounters:  05/30/13 143 lb 3.2 oz (64.955 kg)  05/30/13 143 lb 3.2 oz (64.955 kg)  04/18/13 147 lb 1.9 oz (66.733 kg)     Past Medical History  Diagnosis Date  . Shortness of breath   . CAD (coronary artery disease)     Last catheterization 2009, grafts patent, EF 70%, 80% small OM with possible mild ischemic  . Hx of CABG      2000, LIMA to LAD, SVG to diagonal, SVG to OM, SVG to posterior descending  . Ejection fraction     EF 70%,, 2009 /  EF 65%, echo, October, 2011  . Hypertension   . Palpitations   . Dyslipidemia   . Diabetes mellitus   . Pancreatitis   . Carotid artery disease     Mild, Doppler, 2000  . Dizziness     Evaluated April, 2008, no treatment needed  . Cervical spine disease     Severe neck pain, 2011  . Hyperkalemia     Her doctor Drema Dallas, September, 2012, ACE inhibitor stopped, probable amlodipine to be  . Shortness of breath   . History of hysterectomy   . Chest pain     Chest hurting, May, 2013    Current Outpatient Prescriptions  Medication Sig Dispense Refill  . ACCU-CHEK AVIVA PLUS test strip       . ACCU-CHEK SOFTCLIX LANCETS lancets       . amLODipine (NORVASC) 5 MG tablet Take 1 tablet (5 mg total) by mouth daily.  30 tablet  11  . aspirin 81 MG tablet Take 81 mg by mouth daily.        Marland Kitchen atorvastatin (LIPITOR) 20 MG tablet Take 1 tablet (20 mg total) by mouth daily.  90 tablet  0  . Blood Glucose Monitoring Suppl (ACCU-CHEK AVIVA  PLUS) W/DEVICE KIT       . clopidogrel (PLAVIX) 75 MG tablet Take 1 tablet (75 mg total) by mouth daily. Continue on hold for 2 more weeks.  90 tablet  2  . DULoxetine (CYMBALTA) 60 MG capsule Take 60 mg by mouth daily.      . feeding supplement (ENSURE COMPLETE) LIQD Take 237 mLs by mouth 2 (two) times daily between meals.      . feeding supplement (ENSURE) PUDG Take 1 Container by mouth 2 (two) times daily with a meal.      . gabapentin (NEURONTIN) 100 MG capsule Take 200 mg by mouth 2 (two) times daily.      Marland Kitchen glimepiride (AMARYL) 2 MG tablet Take 2 mg by mouth daily with breakfast.      . isosorbide mononitrate (IMDUR) 15 mg TB24 24 hr tablet Take 0.5 tablets (15 mg total) by mouth daily.  45 tablet  3  . LORazepam (ATIVAN) 0.5 MG tablet Take 1 tablet (0.5 mg total) by mouth 2 (two) times daily.  60 tablet  1  . metFORMIN (GLUCOPHAGE) 500 MG  tablet Take 1 tablet (500 mg total) by mouth 2 (two) times daily with a meal. Take 2 tabs in the AM and 1 tab in the PM      . metoprolol succinate (TOPROL-XL) 25 MG 24 hr tablet Take 1 tablet (25 mg total) by mouth daily.  90 tablet  3  . nitroGLYCERIN (NITROSTAT) 0.4 MG SL tablet Place 1 tablet (0.4 mg total) under the tongue every 5 (five) minutes as needed. For chest pain  25 tablet  6  . nitroGLYCERIN (NITROSTAT) 0.4 MG SL tablet Place 1 tablet (0.4 mg total) under the tongue every 5 (five) minutes x 3 doses as needed for chest pain.  25 tablet  2  . sitaGLIPtin (JANUVIA) 100 MG tablet Take 100 mg by mouth daily.         No current facility-administered medications for this visit.    Allergies:   Codeine   Social History:  The patient  reports that she has never smoked. She does not have any smokeless tobacco history on file. She reports that she does not drink alcohol or use illicit drugs.   Family History:  The patient's family history includes Cancer in her mother and another family member; Coronary artery disease in her father; Heart failure in her father and another family member.   ROS:  Please see the history of present illness.   No bleeding problems.  No dysphagia, odynophagia.   All other systems reviewed and negative.   PHYSICAL EXAM: VS:  BP 100/60  Pulse 62  Ht '5\' 7"'  (1.702 m)  Wt 144 lb (65.318 kg)  BMI 22.55 kg/m2  Filed Vitals:   06/26/13 1541 06/26/13 1631 06/26/13 1632 06/26/13 1633  BP: 120/74 128/70 110/62 100/60  Pulse: 64 70 64 62  Height: '5\' 7"'  (1.702 m)   '5\' 7"'  (1.702 m)  Weight: 144 lb (65.318 kg)   144 lb (65.318 kg)     Well nourished, well developed, in no acute distress HEENT: normal Neck: no JVD Cardiac:  normal S1, S2; RRR; 2/6 systolic murmurat LUSB Lungs:  Possible R basilar crackles, no wheezing Abd: soft, nontender, no hepatomegaly Ext: no edemaright groin without hematoma or bruit  Skin: warm and dry Neuro:  CNs 2-12 intact, no focal  abnormalities noted  EKG:  NSR, HR 64, normal axis, TWI in 1, aVL, no change from prior  tracing     ASSESSMENT AND PLAN:  1. Dyspnea:  Etiology not clear.  She may be deconditioned.  She does have a systolic murmur.  No mention of AS or MR on her cath.  Doubt she has significant valvular disease.  I will check an echo.  She does not look volume overloaded.  I will check a BMET, BNP, CBC.  She has ? Crackles in R base on lung exam.  She was recently in the hospital.  Check a CXR.   2. Chest Pain:  Atypical.  No improvement with nitrates and beta blocker.  I will place her on Protonix 40 mg QD.  She may need referral to GI. 3. Orthostatic Hypotension:  She has a long hx of this.  I have asked her to get compression stockings.  D/c Imdur.  Check BMET, CBC today.   4. CAD s/p CABG:  Patent grafts by recent cath.  Continue ASA, statin, beta blocker. 5. Hypertension:  BP somewhat soft.  D/c Imdur as noted.  Consider decreasing Norvasc if remains low.  6. Hyperlipidemia:  Continue statin.  7. Disposition:  F/u with Dr. Cleatis Polka or me in 2-3 weeks.  Signed, Richardson Dopp, PA-C  06/26/2013 3:37 PM

## 2013-06-26 NOTE — Patient Instructions (Signed)
You will need lab work today: bmp,bnp, cbc  Medication changes:  STOP Imdur ( isosorbide mononitrate)                                     START:  Protonix $RemoveBef [id number] schedule a follow-up appointment in:  Dr. Myrtis SerKatz on 07/18/13  Your physician has requested that you have an echocardiogram. Echocardiography is a painless test that uses sound waves to create images of your heart. It provides your doctor with information about the size and shape of your heart and how well your heart's chambers and valves are working. This procedure takes approximately one hour. There are no restrictions for this procedure.  A chest x-ray takes a picture of the organs and structures inside the chest, including the heart, lungs, and blood vessels. This test can show several things, including, whether the heart is enlarges; whether fluid is building up in the lungs; and whether pacemaker / defibrillator leads are still in place.

## 2013-07-01 NOTE — Addendum Note (Signed)
Addended by: Tarri FullerFIATO, CAROL M on: 07/01/2013 03:23 PM   Modules accepted: Orders

## 2013-07-03 ENCOUNTER — Other Ambulatory Visit (INDEPENDENT_AMBULATORY_CARE_PROVIDER_SITE_OTHER): Payer: Medicare Other

## 2013-07-03 ENCOUNTER — Ambulatory Visit
Admission: RE | Admit: 2013-07-03 | Discharge: 2013-07-03 | Disposition: A | Discharge status: Home / Self Care | Payer: Medicare Other | Source: Ambulatory Visit | Attending: Physician Assistant | Admitting: Physician Assistant

## 2013-07-03 DIAGNOSIS — I10 Essential (primary) hypertension: Secondary | ICD-10-CM

## 2013-07-03 DIAGNOSIS — R0602 Shortness of breath: Secondary | ICD-10-CM

## 2013-07-03 LAB — BASIC METABOLIC PANEL
BUN: 22 mg/dL (ref 6–23)
CHLORIDE: 101 meq/L (ref 96–112)
CO2: 24 mEq/L (ref 19–32)
CREATININE: 1.2 mg/dL (ref 0.4–1.2)
Calcium: 9.6 mg/dL (ref 8.4–10.5)
GFR: 46.53 mL/min — ABNORMAL LOW (ref >60.00)
Glucose, Bld: 266 mg/dL — ABNORMAL HIGH (ref 70–99)
Potassium: 4.1 mEq/L (ref 3.5–5.1)
SODIUM: 137 meq/L (ref 135–145)

## 2013-07-03 LAB — CBC WITH DIFFERENTIAL/PLATELET
BASOS PCT: 0.4 % (ref 0.0–3.0)
Basophils Absolute: 0 10*3/uL (ref 0.0–0.1)
EOS PCT: 1 % (ref 0.0–5.0)
Eosinophils Absolute: 0.1 10*3/uL (ref 0.0–0.7)
HCT: 38.2 % (ref 36.0–46.0)
HEMOGLOBIN: 12.7 g/dL (ref 12.0–15.0)
LYMPHS PCT: 36.7 % (ref 12.0–46.0)
Lymphs Abs: 2.6 10*3/uL (ref 0.7–4.0)
MCHC: 33.3 g/dL (ref 30.0–36.0)
MCV: 91.4 fl (ref 78.0–100.0)
Monocytes Absolute: 0.6 10*3/uL (ref 0.1–1.0)
Monocytes Relative: 7.8 % (ref 3.0–12.0)
NEUTROS ABS: 3.9 10*3/uL (ref 1.4–7.7)
Neutrophils Relative %: 54.1 % (ref 43.0–77.0)
Platelets: 247 10*3/uL (ref 150.0–400.0)
RBC: 4.18 Mil/uL (ref 3.87–5.11)
RDW: 14.6 % (ref 11.5–14.6)
WBC: 7.2 10*3/uL (ref 4.5–10.5)

## 2013-07-03 LAB — BRAIN NATRIURETIC PEPTIDE: Pro B Natriuretic peptide (BNP): 47 pg/mL (ref 0.0–100.0)

## 2013-07-04 ENCOUNTER — Telehealth: Payer: Self-pay | Admitting: *Deleted

## 2013-07-04 NOTE — Telephone Encounter (Signed)
pt notified about lab and cxr results with verbal understanding

## 2013-07-10 ENCOUNTER — Ambulatory Visit (HOSPITAL_COMMUNITY): Payer: Medicare Other | Attending: Cardiology | Admitting: Radiology

## 2013-07-10 DIAGNOSIS — R0602 Shortness of breath: Secondary | ICD-10-CM

## 2013-07-10 DIAGNOSIS — R079 Chest pain, unspecified: Secondary | ICD-10-CM

## 2013-07-10 DIAGNOSIS — I2510 Atherosclerotic heart disease of native coronary artery without angina pectoris: Secondary | ICD-10-CM

## 2013-07-10 DIAGNOSIS — R011 Cardiac murmur, unspecified: Secondary | ICD-10-CM | POA: Insufficient documentation

## 2013-07-10 DIAGNOSIS — R072 Precordial pain: Secondary | ICD-10-CM

## 2013-07-10 NOTE — Progress Notes (Signed)
Echocardiogram performed.  

## 2013-07-15 ENCOUNTER — Telehealth: Payer: Self-pay | Admitting: *Deleted

## 2013-07-15 DIAGNOSIS — I10 Essential (primary) hypertension: Secondary | ICD-10-CM

## 2013-07-15 MED ORDER — FUROSEMIDE 20 MG PO TABS: ORAL_TABLET | ORAL | Status: DC

## 2013-07-15 NOTE — Telephone Encounter (Signed)
pt notified per Bing NeighborsScott W. PA to start low dose lasix 20 mg on Mon, Wed, and Fri's. Will get repeat BMET 5/20, pt aware with verbal understanding to all instructions.

## 2013-07-16 ENCOUNTER — Encounter: Payer: Self-pay | Admitting: Cardiology

## 2013-07-16 DIAGNOSIS — I951 Orthostatic hypotension: Secondary | ICD-10-CM | POA: Insufficient documentation

## 2013-07-18 ENCOUNTER — Ambulatory Visit (INDEPENDENT_AMBULATORY_CARE_PROVIDER_SITE_OTHER): Payer: Medicare Other | Admitting: Cardiology

## 2013-07-18 ENCOUNTER — Encounter: Payer: Self-pay | Admitting: Cardiology

## 2013-07-18 VITALS — BP 140/70 | HR 55 | Ht 67.0 in | Wt 147.0 lb

## 2013-07-18 DIAGNOSIS — R0602 Shortness of breath: Secondary | ICD-10-CM

## 2013-07-18 DIAGNOSIS — E785 Hyperlipidemia, unspecified: Secondary | ICD-10-CM

## 2013-07-18 DIAGNOSIS — I10 Essential (primary) hypertension: Secondary | ICD-10-CM

## 2013-07-18 DIAGNOSIS — I2510 Atherosclerotic heart disease of native coronary artery without angina pectoris: Secondary | ICD-10-CM

## 2013-07-18 MED ORDER — AMLODIPINE BESYLATE 10 MG PO TABS: 10.0000 mg | ORAL_TABLET | Freq: Every day | ORAL | Status: DC

## 2013-07-18 NOTE — Assessment & Plan Note (Addendum)
The etiology of the patient's shortness of breath is not clear. Echo has shown excellent LV function. She does not appear to be volume overloaded. Her O2 sat is good both at rest and with walking. She does not appear to have chronotropic incompetence on the current dose of beta blocker. The only change for the patient is that her amlodipine was lowered from 10-5 mg and she was started on low-dose beta-blockade and low dose nitrate (nitrate stopped last visit). It is possible that she will feel better back on the medicine that she was on prior to hospitalization. She says that the shortness of breath is new since hospitalization. I would change her back to amlodipine and then see her back for followup.  As part of today's evaluation I spent greater than 25 minutes with her total care. I reviewed her heart catheterization in her records from the hospital. I reviewed the office visit from her post hospital visit. I spoke at length with the patient and her family about her symptoms. I evaluated her with walking looking in her O2 sats and her heart rate. More than half of the time was spent with direct contact with the patient.

## 2013-07-18 NOTE — Assessment & Plan Note (Signed)
Coronary disease is stable. This was proven by her recent cath. No further workup.

## 2013-07-18 NOTE — Patient Instructions (Signed)
**Note De-Identified  Obfuscation** Your physician has recommended you make the following change in your medication: stop taking Metoprolol (toprol) and increase Amlodipine to 10 mg daily.  Your physician recommends that you schedule a follow-up appointment in: 8 to 10 weeks.

## 2013-07-18 NOTE — Progress Notes (Signed)
Patient ID: [name], female   DOB: September 11, 1928, 79 y.o.   MRN: [id number]    HPI  The patient is seen back for her overall cardiology followup. She was hospitalized with some chest discomfort May 28, 2013. She did undergo cardiac catheterization. She had 4 out of 4 patent grafts. She was placed on a beta blocker and low-dose nitrate at that time. She was seen after the hospitalization by Mr. Kathlen Mody. He noted that this dyspnea has been present only since her hospitalization. Followup two-dimensional echo was done. It showed good left ventricular function. There was septal dyssynergy from her prior surgery. Her labs were checked showing no significant abnormalities.  Today she is here and she says that she still has shortness of breath with very limited walking. She has a slight tingling in her chest but no significant chest pain. She has not had syncope or presyncope.  Allergies  Allergen Reactions  . Codeine     Unknown    Current Outpatient Prescriptions  Medication Sig Dispense Refill  . ACCU-CHEK AVIVA PLUS test strip       . ACCU-CHEK SOFTCLIX LANCETS lancets       . amLODipine (NORVASC) 5 MG tablet Take 1 tablet (5 mg total) by mouth daily.  30 tablet  11  . aspirin 81 MG tablet Take 81 mg by mouth daily.        Marland Kitchen atorvastatin (LIPITOR) 20 MG tablet Take 1 tablet (20 mg total) by mouth daily.  90 tablet  0  . Blood Glucose Monitoring Suppl (ACCU-CHEK AVIVA PLUS) W/DEVICE KIT       . clopidogrel (PLAVIX) 75 MG tablet Take 1 tablet (75 mg total) by mouth daily. Continue on hold for 2 more weeks.  90 tablet  2  . DULoxetine (CYMBALTA) 60 MG capsule Take 60 mg by mouth daily.      . feeding supplement (ENSURE COMPLETE) LIQD Take 237 mLs by mouth 2 (two) times daily between meals.      . feeding supplement (ENSURE) PUDG Take 1 Container by mouth 2 (two) times daily with a meal.      . gabapentin (NEURONTIN) 100 MG capsule Take 200 mg by mouth 2 (two) times daily.      Marland Kitchen glimepiride  (AMARYL) 2 MG tablet Take 2 mg by mouth daily with breakfast.      . LORazepam (ATIVAN) 0.5 MG tablet Take 1 tablet (0.5 mg total) by mouth 2 (two) times daily.  60 tablet  1  . metFORMIN (GLUCOPHAGE) 500 MG tablet Take 1 tablet (500 mg total) by mouth 2 (two) times daily with a meal. Take 2 tabs in the AM and 1 tab in the PM      . metoprolol succinate (TOPROL-XL) 25 MG 24 hr tablet Take 25 mg by mouth daily.      . nitroGLYCERIN (NITROSTAT) 0.4 MG SL tablet Place 1 tablet (0.4 mg total) under the tongue every 5 (five) minutes as needed. For chest pain  25 tablet  6  . pantoprazole (PROTONIX) 40 MG tablet Take 1 tablet (40 mg total) by mouth daily.  30 tablet  11  . sitaGLIPtin (JANUVIA) 100 MG tablet Take 100 mg by mouth daily.         No current facility-administered medications for this visit.    History   Social History  . Marital Status: Divorced    Spouse Name: N/A    Number of Children: N/A  . Years of Education: N/A  Occupational History  . retired Community education officer   Social History Main Topics  . Smoking status: Never Smoker   . Smokeless tobacco: Not on file     Comment: quit in the 1970's  . Alcohol Use: No  . Drug Use: No  . Sexual Activity: Not on file   Other Topics Concern  . Not on file   Social History Narrative  . No narrative on file    Family History  Problem Relation Age of Onset  . Cancer Mother   . Heart failure Father   . Coronary artery disease Father   . Cancer      siblings  . Heart failure      siblings    Past Medical History  Diagnosis Date  . Shortness of breath   . CAD (coronary artery disease)     Last catheterization 2009, grafts patent, EF 70%, 80% small OM with possible mild ischemic  . Hx of CABG     2000, LIMA to LAD, SVG to diagonal, SVG to OM, SVG to posterior descending  . Ejection fraction     EF 70%,, 2009 /  EF 65%, echo, October, 2011  . Hypertension   . Palpitations   . Dyslipidemia   . Diabetes mellitus     . Pancreatitis   . Carotid artery disease     Mild, Doppler, 2000  . Dizziness     Evaluated April, 2008, no treatment needed  . Cervical spine disease     Severe neck pain, 2011  . Hyperkalemia     Her doctor Drema Dallas, September, 2012, ACE inhibitor stopped, probable amlodipine to be  . Shortness of breath   . History of hysterectomy   . Chest pain     Chest hurting, May, 2013    Past Surgical History  Procedure Laterality Date  . Coronary artery bypass graft  2000  . Cardiac catheterization  2009  . Cholecystectomy    . Anterior cervical decomp/discectomy fusion  11/30/2011    Procedure: ANTERIOR CERVICAL DECOMPRESSION/DISCECTOMY FUSION 1 LEVEL;  Surgeon: Eustace Moore, MD;  Location: [facility];  Service: Neurosurgery;  Laterality: N/A;  Anterior Cervical Decompression Discectomy Cervical Five-Six    Patient Active Problem List   Diagnosis Date Noted  . Orthostatic hypotension 07/16/2013  . Myelopathy 12/07/2011  . Depression 12/04/2011  . Fever 12/03/2011  . Pain of upper extremity 12/01/2011  . Upper extremity weakness 11/24/2011  . UTI (lower urinary tract infection) 11/18/2011  . Altered mental status 11/18/2011  . Fall 11/18/2011  . Shortness of breath   . CAD- CABG '00. Cath in '09 and 05/29/13- patent grafts   . Hx of CABG   . Ejection fraction   . Hypertension   . Palpitations   . Dyslipidemia   . DM (diabetes mellitus)   . Carotid artery disease   . Dizziness   . Cervical spine disease   . Hyperkalemia     ROS   Patient denies fever, chills, headache, sweats, rash, change in vision, change in hearing, cough, nausea vomiting, urinary symptoms. All other systems are reviewed and are negative.  PHYSICAL EXAM  Patient is here with family. She is oriented to person time and place. Affect is normal. Head is atraumatic. Sclera and conjunctiva are normal. There is no jugular venous distention. Lungs are clear. Respiratory effort is nonlabored. Cardiac exam  reveals S1 and S2. There is a 2/6 murmur. The abdomen is soft. There is  no peripheral edema. There no musculoskeletal deformities. There are no skin rashes.  As part of the evaluation we had the patient walk on the floor. Resting heart rate of 66 1 up to 90. O2 sat of 98 reduced and 95. She said that she had some back discomfort and some shortness of breath.  Filed Vitals:   07/18/13 1410  BP: 140/70  Pulse: 55  Height: _0  (1.702 m)  Weight: 147 lb (66.679 kg)     ASSESSMENT & PLAN

## 2013-07-18 NOTE — Assessment & Plan Note (Signed)
Her lipids are being treated. No change in therapy. 

## 2013-07-18 NOTE — Assessment & Plan Note (Signed)
Blood pressures controlled. No change in therapy. 

## 2013-07-30 ENCOUNTER — Other Ambulatory Visit: Payer: Medicare Other

## 2013-09-18 ENCOUNTER — Ambulatory Visit (INDEPENDENT_AMBULATORY_CARE_PROVIDER_SITE_OTHER): Payer: Medicare Other | Admitting: Cardiology

## 2013-09-18 ENCOUNTER — Encounter: Payer: Self-pay | Admitting: Cardiology

## 2013-09-18 VITALS — BP 122/70 | HR 94 | Ht 67.0 in | Wt 140.4 lb

## 2013-09-18 DIAGNOSIS — I2510 Atherosclerotic heart disease of native coronary artery without angina pectoris: Secondary | ICD-10-CM

## 2013-09-18 DIAGNOSIS — R0602 Shortness of breath: Secondary | ICD-10-CM

## 2013-09-18 NOTE — Progress Notes (Signed)
Patient ID: [name], female   DOB: 1928-07-17, 80 y.o.   MRN: [id number]    HPI  Patient is seen today to followup coronary disease and shortness of breath. I saw her last Jul 18, 2013. We made some minor adjustments in her meds. She is not convinced that she feels any better. Overall she has generalized fatigue and shortness of breath. She has had some intermittent edema. She does not have PND or orthopnea. She was encouraged to take her small dose of Lasix when she had edema. This seems to help.  Allergies  Allergen Reactions  . Codeine     Unknown    Current Outpatient Prescriptions  Medication Sig Dispense Refill  . ACCU-CHEK AVIVA PLUS test strip       . ACCU-CHEK SOFTCLIX LANCETS lancets       . amLODipine (NORVASC) 10 MG tablet Take 1 tablet (10 mg total) by mouth daily.  30 tablet  11  . aspirin 81 MG tablet Take 81 mg by mouth daily.        Marland Kitchen atorvastatin (LIPITOR) 20 MG tablet Take 1 tablet (20 mg total) by mouth daily.  90 tablet  0  . Blood Glucose Monitoring Suppl (ACCU-CHEK AVIVA PLUS) W/DEVICE KIT       . clopidogrel (PLAVIX) 75 MG tablet Take 1 tablet (75 mg total) by mouth daily. Continue on hold for 2 more weeks.  90 tablet  2  . DULoxetine (CYMBALTA) 60 MG capsule Take 60 mg by mouth daily.      . feeding supplement (ENSURE COMPLETE) LIQD Take 237 mLs by mouth 2 (two) times daily between meals.      . feeding supplement (ENSURE) PUDG Take 1 Container by mouth 2 (two) times daily with a meal.      . furosemide (LASIX) 20 MG tablet       . gabapentin (NEURONTIN) 100 MG capsule Take 200 mg by mouth 2 (two) times daily.      Marland Kitchen glimepiride (AMARYL) 2 MG tablet Take 2 mg by mouth daily with breakfast.      . LORazepam (ATIVAN) 0.5 MG tablet Take 1 tablet (0.5 mg total) by mouth 2 (two) times daily.  60 tablet  1  . metFORMIN (GLUCOPHAGE) 500 MG tablet Take 1 tablet (500 mg total) by mouth 2 (two) times daily with a meal. Take 2 tabs in the AM and 1 tab in the PM      .  nitroGLYCERIN (NITROSTAT) 0.4 MG SL tablet Place 1 tablet (0.4 mg total) under the tongue every 5 (five) minutes as needed. For chest pain  25 tablet  6  . pantoprazole (PROTONIX) 40 MG tablet Take 1 tablet (40 mg total) by mouth daily.  30 tablet  11  . sitaGLIPtin (JANUVIA) 100 MG tablet Take 100 mg by mouth daily.         No current facility-administered medications for this visit.    History   Social History  . Marital Status: Divorced    Spouse Name: N/A    Number of Children: N/A  . Years of Education: N/A   Occupational History  . retired Community education officer   Social History Main Topics  . Smoking status: Never Smoker   . Smokeless tobacco: Not on file     Comment: quit in the 1970's  . Alcohol Use: No  . Drug Use: No  . Sexual Activity: Not on file   Other Topics Concern  .  Not on file   Social History Narrative  . No narrative on file    Family History  Problem Relation Age of Onset  . Cancer Mother   . Heart failure Father   . Coronary artery disease Father   . Cancer      siblings  . Heart failure      siblings    Past Medical History  Diagnosis Date  . Shortness of breath   . CAD (coronary artery disease)     Last catheterization 2009, grafts patent, EF 70%, 80% small OM with possible mild ischemic  . Hx of CABG     2000, LIMA to LAD, SVG to diagonal, SVG to OM, SVG to posterior descending  . Ejection fraction     EF 70%,, 2009 /  EF 65%, echo, October, 2011  . Hypertension   . Palpitations   . Dyslipidemia   . Diabetes mellitus   . Pancreatitis   . Carotid artery disease     Mild, Doppler, 2000  . Dizziness     Evaluated April, 2008, no treatment needed  . Cervical spine disease     Severe neck pain, 2011  . Hyperkalemia     Her doctor Drema Dallas, September, 2012, ACE inhibitor stopped, probable amlodipine to be  . Shortness of breath   . History of hysterectomy   . Chest pain     Chest hurting, May, 2013    Past Surgical History    Procedure Laterality Date  . Coronary artery bypass graft  2000  . Cardiac catheterization  2009  . Cholecystectomy    . Anterior cervical decomp/discectomy fusion  11/30/2011    Procedure: ANTERIOR CERVICAL DECOMPRESSION/DISCECTOMY FUSION 1 LEVEL;  Surgeon: Eustace Moore, MD;  Location: [facility];  Service: Neurosurgery;  Laterality: N/A;  Anterior Cervical Decompression Discectomy Cervical Five-Six    Patient Active Problem List   Diagnosis Date Noted  . Orthostatic hypotension 07/16/2013  . Myelopathy 12/07/2011  . Depression 12/04/2011  . Fever 12/03/2011  . Pain of upper extremity 12/01/2011  . Upper extremity weakness 11/24/2011  . UTI (lower urinary tract infection) 11/18/2011  . Altered mental status 11/18/2011  . Fall 11/18/2011  . Shortness of breath   . CAD- CABG '00. Cath in '09 and 05/29/13- patent grafts   . Hx of CABG   . Ejection fraction   . Hypertension   . Palpitations   . Dyslipidemia   . DM (diabetes mellitus)   . Carotid artery disease   . Dizziness   . Cervical spine disease   . Hyperkalemia     ROS   Patient denies fever, chills, headache, sweats, rash, change in vision, change in hearing, chest pain, cough, nausea vomiting, urinary symptoms. All other systems are reviewed and are negative.  PHYSICAL EXAM  The patient seems stable. She is oriented to person time and place. She says that she is fatigued and short of breath but she looks quite stable. Head is atraumatic. Sclera and conjunctiva are normal. Lungs are clear. Respiratory effort is not labored at rest. Cardiac exam reveals an S1 and S2. The abdomen is soft. There is no significant peripheral edema at this time.  Filed Vitals:   09/18/13 1449  BP: 122/70  Pulse: 94  Height: '5\' 7"'  (1.702 m)  Weight: 140 lb 6.4 oz (63.685 kg)     ASSESSMENT & PLAN

## 2013-09-18 NOTE — Assessment & Plan Note (Signed)
She has continued fatigue and shortness of breath. However I am not convinced that she is volume overloaded. She will be allowed to use small dose of Lasix when she develops edema. Otherwise she will not be on this daily. She has had problems with orthostatic hypotension before. She is encouraged to be fully active.

## 2013-09-18 NOTE — Assessment & Plan Note (Signed)
The patient's grafts were patent by cath March, 2015. No further workup.

## 2013-09-18 NOTE — Patient Instructions (Signed)
**Note De-Identified  Obfuscation** Your physician has recommended you make the following change in your medication: take Furosemide as needed   Your physician wants you to follow-up in: 6 months. You will receive a reminder letter in the mail two months in advance. If you don't receive a letter, please call our office to schedule the follow-up appointment.

## 2014-02-19 ENCOUNTER — Encounter (HOSPITAL_COMMUNITY): Payer: Self-pay | Admitting: Cardiovascular Disease

## 2014-03-19 ENCOUNTER — Other Ambulatory Visit: Payer: Self-pay | Admitting: *Deleted

## 2014-03-19 MED ORDER — AMLODIPINE BESYLATE 10 MG PO TABS: 10.0000 mg | ORAL_TABLET | Freq: Every day | ORAL | Status: DC

## 2014-03-19 MED ORDER — PANTOPRAZOLE SODIUM 40 MG PO TBEC: 40.0000 mg | DELAYED_RELEASE_TABLET | Freq: Every day | ORAL | Status: DC

## 2014-03-30 ENCOUNTER — Encounter: Payer: Self-pay | Admitting: Cardiology

## 2014-04-08 ENCOUNTER — Ambulatory Visit: Payer: Medicare Other | Admitting: Cardiology

## 2014-06-19 ENCOUNTER — Ambulatory Visit (INDEPENDENT_AMBULATORY_CARE_PROVIDER_SITE_OTHER): Payer: Medicare Other | Admitting: Cardiology

## 2014-06-19 ENCOUNTER — Encounter: Payer: Self-pay | Admitting: Cardiology

## 2014-06-19 VITALS — BP 115/62 | HR 86 | Ht 67.0 in | Wt 146.0 lb

## 2014-06-19 DIAGNOSIS — R0602 Shortness of breath: Secondary | ICD-10-CM | POA: Diagnosis not present

## 2014-06-19 DIAGNOSIS — I350 Nonrheumatic aortic (valve) stenosis: Secondary | ICD-10-CM | POA: Diagnosis not present

## 2014-06-19 DIAGNOSIS — I2510 Atherosclerotic heart disease of native coronary artery without angina pectoris: Secondary | ICD-10-CM | POA: Diagnosis not present

## 2014-06-19 DIAGNOSIS — I2583 Coronary atherosclerosis due to lipid rich plaque: Secondary | ICD-10-CM

## 2014-06-19 NOTE — Assessment & Plan Note (Signed)
Her grafts were patent in 2015. No further workup.

## 2014-06-19 NOTE — Progress Notes (Signed)
Cardiology Office Note   Date:  06/19/2014   ID:  [name], DOB Apr 25, 1928, MRN [id number]  PCP:  Gerrit Heck, MD  Cardiologist:  Dola Argyle, MD   Chief Complaint  Patient presents with  . Appointment    Follow-up coronary artery disease      History of Present Illness: [name] is a 81 y.o. female who presents today to follow up shortness of breath. This is been a chronic problem. It has been my impression that this is not based on her cardiovascular status.    Past Medical History  Diagnosis Date  . Shortness of breath   . CAD (coronary artery disease)     Last catheterization 2009, grafts patent, EF 70%, 80% small OM with possible mild ischemic  . Hx of CABG     2000, LIMA to LAD, SVG to diagonal, SVG to OM, SVG to posterior descending  . Ejection fraction     EF 70%,, 2009 /  EF 65%, echo, October, 2011  . Hypertension   . Palpitations   . Dyslipidemia   . Diabetes mellitus   . Pancreatitis   . Carotid artery disease     Mild, Doppler, 2000  . Dizziness     Evaluated April, 2008, no treatment needed  . Cervical spine disease     Severe neck pain, 2011  . Hyperkalemia     Her doctor Drema Dallas, September, 2012, ACE inhibitor stopped, probable amlodipine to be  . Shortness of breath   . History of hysterectomy   . Chest pain     Chest hurting, May, 2013    Past Surgical History  Procedure Laterality Date  . Coronary artery bypass graft  2000  . Cardiac catheterization  2009  . Cholecystectomy    . Anterior cervical decomp/discectomy fusion  11/30/2011    Procedure: ANTERIOR CERVICAL DECOMPRESSION/DISCECTOMY FUSION 1 LEVEL;  Surgeon: Eustace Moore, MD;  Location: [facility];  Service: Neurosurgery;  Laterality: N/A;  Anterior Cervical Decompression Discectomy Cervical Five-Six  . Left heart catheterization with coronary/graft angiogram N/A 05/29/2013    Procedure: LEFT HEART CATHETERIZATION WITH Beatrix Fetters;  Surgeon:  Burnell Blanks, MD;  Location: [facility];  Service: Cardiovascular;  Laterality: N/A;    Patient Active Problem List   Diagnosis Date Noted  . Aortic stenosis 06/19/2014  . Orthostatic hypotension 07/16/2013  . Myelopathy 12/07/2011  . Depression 12/04/2011  . Fever 12/03/2011  . Pain of upper extremity 12/01/2011  . Upper extremity weakness 11/24/2011  . UTI (lower urinary tract infection) 11/18/2011  . Altered mental status 11/18/2011  . Fall 11/18/2011  . Shortness of breath   . CAD- CABG '00. Cath in '09 and 05/29/13- patent grafts   . Hx of CABG   . Ejection fraction   . Hypertension   . Palpitations   . Dyslipidemia   . DM (diabetes mellitus)   . Carotid artery disease   . Dizziness   . Cervical spine disease   . Hyperkalemia       Current Outpatient Prescriptions  Medication Sig Dispense Refill  . amLODipine (NORVASC) 10 MG tablet Take 1 tablet (10 mg total) by mouth daily. 90 tablet 1  . aspirin 81 MG tablet Take 81 mg by mouth daily.      Marland Kitchen atorvastatin (LIPITOR) 20 MG tablet Take 1 tablet (20 mg total) by mouth daily. 90 tablet 0  . Blood Glucose Monitoring Suppl (ACCU-CHEK AVIVA PLUS) W/DEVICE KIT     .  clopidogrel (PLAVIX) 75 MG tablet Take 1 tablet (75 mg total) by mouth daily. Continue on hold for 2 more weeks. 90 tablet 2  . DULoxetine (CYMBALTA) 60 MG capsule Take 60 mg by mouth daily.    . feeding supplement (ENSURE COMPLETE) LIQD Take 237 mLs by mouth 2 (two) times daily between meals.    . feeding supplement (ENSURE) PUDG Take 1 Container by mouth 2 (two) times daily with a meal.    . furosemide (LASIX) 20 MG tablet Take 20 mg by mouth as needed for fluid (PATIENT TAKES ONE TABLET BY MOUTH AS NEEDED FOR FLUID RETENSION).     Marland Kitchen gabapentin (NEURONTIN) 100 MG capsule Take 200 mg by mouth 2 (two) times daily.    Marland Kitchen glimepiride (AMARYL) 2 MG tablet Take 2 mg by mouth daily with breakfast.    . LORazepam (ATIVAN) 0.5 MG tablet Take 1 tablet (0.5 mg  total) by mouth 2 (two) times daily. 60 tablet 1  . metFORMIN (GLUCOPHAGE) 500 MG tablet Take 1 tablet (500 mg total) by mouth 2 (two) times daily with a meal. Take 2 tabs in the AM and 1 tab in the PM    . nitroGLYCERIN (NITROSTAT) 0.4 MG SL tablet Place 1 tablet (0.4 mg total) under the tongue every 5 (five) minutes as needed. For chest pain 25 tablet 6  . pantoprazole (PROTONIX) 40 MG tablet Take 1 tablet (40 mg total) by mouth daily. 90 tablet 1  . sitaGLIPtin (JANUVIA) 100 MG tablet Take 100 mg by mouth daily.       No current facility-administered medications for this visit.    Allergies:   Codeine    Social History:  The patient  reports that she has never smoked. She does not have any smokeless tobacco history on file. She reports that she does not drink alcohol or use illicit drugs.   Family History:  The patient's family history includes Cancer in her mother and another family member; Coronary artery disease in her father; Heart failure in her father and another family member.    ROS:  Please see the history of present illness.    Patient denies fever, chills, headache, sweats, rash, change in vision, change in hearing, chest pain, cough, nausea or vomiting, urinary symptoms. All other systems are reviewed and are negative.    PHYSICAL EXAM: VS:  BP 115/62 mmHg  Pulse 86  Ht _0  (1.702 m)  Wt 146 lb (66.225 kg)  BMI 22.86 kg/m2 , The patient is oriented to person time and place. Affect is mildly depressed. This is usual for her. Head is atraumatic. Sclera and conjunctiva are normal. There is no jugular venous distention. Lungs are clear. Respiratory effort is nonlabored. Cardiac exam reveals an S1 and S2. There is a 2/6 crescendo decrescendo systolic murmur. Abdomen is soft. There is no peripheral edema. There are no musculoskeletal deformities. There are no skin rashes. Neurologic is grossly intact.  EKG:   EKG is done today and reviewed by me. There is normal sinus rhythm.  There are mild nonspecific ST-T wave changes. There is no change from the past.   Recent Labs: 07/03/2013: BUN 22; Creatinine 1.2; Hemoglobin 12.7; Platelets 247.0; Potassium 4.1; Pro B Natriuretic peptide (BNP) 47.0; Sodium 137    Lipid Panel    Component Value Date/Time   CHOL  09/06/2009 0615    140        ATP III CLASSIFICATION:  <200     mg/dL   Desirable  200-239  mg/dL   Borderline High  >=240    mg/dL   High          TRIG 55 09/06/2009 0615   TRIG 139 03/12/2006 0832   HDL 60 09/06/2009 0615   CHOLHDL 2.3 09/06/2009 0615   CHOLHDL 3.3 CALC 03/12/2006 0832   VLDL 11 09/06/2009 0615   LDLCALC  09/06/2009 0615    69        Total Cholesterol/HDL:CHD Risk Coronary Heart Disease Risk Table                     Men   Women  1/2 Average Risk   3.4   3.3  Average Risk       5.0   4.4  2 X Average Risk   9.6   7.1  3 X Average Risk  23.4   11.0        Use the calculated Patient Ratio above and the CHD Risk Table to determine the patient's CHD Risk.        ATP III CLASSIFICATION (LDL):  <100     mg/dL   Optimal  100-129  mg/dL   Near or Above                    Optimal  130-159  mg/dL   Borderline  160-189  mg/dL   High  >190     mg/dL   Very High      Wt Readings from Last 3 Encounters:  06/19/14 146 lb (66.225 kg)  09/18/13 140 lb 6.4 oz (63.685 kg)  07/18/13 147 lb (66.679 kg)      Current medicines are reviewed  The patient understands her medications.     ASSESSMENT AND PLAN:

## 2014-06-19 NOTE — Patient Instructions (Signed)
Your physician recommends that you continue on your current medications as directed. Please refer to the Current Medication list given to you today.  Your physician wants you to follow-up in: 9 month with one of our Physicians from our Cardiac Team.  You will receive a reminder letter in the mail two months in advance. If you don't receive a letter, please call our office to schedule the follow-up appointment.

## 2014-06-19 NOTE — Assessment & Plan Note (Signed)
There is aortic stenosis. It was mild by echo 2015. She does not need a follow-up echo at this time.

## 2014-06-19 NOTE — Assessment & Plan Note (Signed)
The patient has shortness of breath. This is chronic. There is no evidence of CHF. This is not from ischemia or her aortic stenosis. No further workup.

## 2014-07-06 ENCOUNTER — Encounter: Payer: Self-pay | Admitting: Cardiology

## 2014-07-16 ENCOUNTER — Ambulatory Visit: Payer: Medicaid Other | Admitting: Neurology

## 2014-08-06 ENCOUNTER — Ambulatory Visit: Payer: Medicaid Other | Admitting: Neurology

## 2014-08-13 ENCOUNTER — Encounter: Payer: Self-pay | Admitting: Neurology

## 2014-08-13 ENCOUNTER — Ambulatory Visit (INDEPENDENT_AMBULATORY_CARE_PROVIDER_SITE_OTHER): Payer: Medicare Other | Admitting: Neurology

## 2014-08-13 ENCOUNTER — Ambulatory Visit: Payer: Medicaid Other | Admitting: Neurology

## 2014-08-13 VITALS — BP 118/70 | HR 80 | Ht 67.0 in | Wt 148.0 lb

## 2014-08-13 DIAGNOSIS — R413 Other amnesia: Secondary | ICD-10-CM

## 2014-08-13 DIAGNOSIS — R270 Ataxia, unspecified: Secondary | ICD-10-CM | POA: Diagnosis not present

## 2014-08-13 DIAGNOSIS — E538 Deficiency of other specified B group vitamins: Secondary | ICD-10-CM

## 2014-08-13 MED ORDER — DONEPEZIL HCL 5 MG PO TABS: 5.0000 mg | ORAL_TABLET | Freq: Every day | ORAL | Status: DC

## 2014-08-13 MED ORDER — DONEPEZIL HCL 10 MG PO TABS: 10.0000 mg | ORAL_TABLET | Freq: Every day | ORAL | Status: DC

## 2014-08-13 NOTE — Patient Instructions (Signed)
Remember to drink plenty of fluid, eat healthy meals and do not skip any meals. Try to eat protein with a every meal and eat a healthy snack such as fruit or nuts in between meals. Try to keep a regular sleep-wake schedule and try to exercise daily, particularly in the form of walking, 20-30 minutes a day, if you can.   As far as your medications are concerned, I would like to suggest: Aricept 5mg  daily. In 2 weeks can increase to 10mg  daily. At next appointment can add namenda.  As far as diagnostic testing: MRi of the brain, B12  I would like to see you back in 6 months, sooner if we need to. Please call us with any interim questions, concerns, problems, updates or refill requests.   Please also call us for any test results so we can go over those with you on the phone.  My clinical assistant and will answer any of your questions and relay your messages to me and also relay most of my messages to you.   Our phone number is [phone]. We also have an after hours call service for urgent matters and there is a physician on-call for urgent questions. For any emergencies you know to call 911 or go to the nearest emergency room
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
## 2014-08-14 ENCOUNTER — Encounter: Payer: Self-pay | Admitting: Neurology

## 2014-09-03 ENCOUNTER — Other Ambulatory Visit: Payer: Medicare Other

## 2014-09-14 ENCOUNTER — Other Ambulatory Visit: Payer: Self-pay | Admitting: Cardiology

## 2014-09-15 ENCOUNTER — Other Ambulatory Visit: Payer: Self-pay

## 2014-09-15 MED ORDER — PANTOPRAZOLE SODIUM 40 MG PO TBEC: 40.0000 mg | DELAYED_RELEASE_TABLET | Freq: Every day | ORAL | Status: DC

## 2014-09-15 MED ORDER — AMLODIPINE BESYLATE 10 MG PO TABS: 10.0000 mg | ORAL_TABLET | Freq: Every day | ORAL | Status: DC

## 2014-09-18 ENCOUNTER — Ambulatory Visit
Admission: RE | Admit: 2014-09-18 | Discharge: 2014-09-18 | Disposition: A | Discharge status: Home / Self Care | Payer: Medicare Other | Source: Ambulatory Visit | Attending: Neurology | Admitting: Neurology

## 2014-09-18 DIAGNOSIS — R270 Ataxia, unspecified: Secondary | ICD-10-CM

## 2014-09-18 DIAGNOSIS — R413 Other amnesia: Secondary | ICD-10-CM

## 2014-09-22 ENCOUNTER — Telehealth: Payer: Self-pay | Admitting: *Deleted

## 2014-09-22 NOTE — Telephone Encounter (Signed)
Spoke w/ pt daughter, Synetta Failnita, about MRI brain. Told her "MRI of patient's brain has not significantly changed since 2011. No stroke or other significant events to explain progressive memory loss. Unfortunately, we cannot diagnose alzheimer's dementia from an mri of the brain and this does not rule out alzheimer's dementis. This just rules out other causes of memory loss. Alzheimer's dementia is most likely" per Dr. Lucia GaskinsAhern. Told her we will call her for anything else and to call if she has further questions. She verbalized understanding.

## 2015-01-08 ENCOUNTER — Ambulatory Visit (INDEPENDENT_AMBULATORY_CARE_PROVIDER_SITE_OTHER): Payer: Medicare Other | Admitting: Neurology

## 2015-01-08 ENCOUNTER — Encounter: Payer: Self-pay | Admitting: Neurology

## 2015-01-08 VITALS — BP 160/77 | HR 68 | Ht 67.0 in | Wt 140.2 lb

## 2015-01-08 DIAGNOSIS — R4189 Other symptoms and signs involving cognitive functions and awareness: Secondary | ICD-10-CM | POA: Diagnosis not present

## 2015-01-08 DIAGNOSIS — E538 Deficiency of other specified B group vitamins: Secondary | ICD-10-CM

## 2015-01-08 DIAGNOSIS — F0390 Unspecified dementia without behavioral disturbance: Secondary | ICD-10-CM

## 2015-01-08 DIAGNOSIS — R413 Other amnesia: Secondary | ICD-10-CM | POA: Diagnosis not present

## 2015-01-08 MED ORDER — MEMANTINE HCL 28 X 5 MG & 21 X 10 MG PO TABS: ORAL_TABLET | ORAL | Status: DC

## 2015-01-08 MED ORDER — BUPROPION HCL 100 MG PO TABS: 100.0000 mg | ORAL_TABLET | Freq: Two times a day (BID) | ORAL | Status: DC

## 2015-01-08 MED ORDER — MEMANTINE HCL-DONEPEZIL HCL ER 28-10 MG PO CP24: 1.0000 | ORAL_CAPSULE | Freq: Every day | ORAL | Status: DC
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
## 2015-01-08 NOTE — Patient Instructions (Signed)
Overall you are doing fairly well but I do want to suggest a few things today:   Remember to drink plenty of fluid, eat healthy meals and do not skip any meals. Try to eat protein with a every meal and eat a healthy snack such as fruit or nuts in between meals. Try to keep a regular sleep-wake schedule and try to exercise daily, particularly in the form of walking, 20-30 minutes a day, if you can.   As far as your medications are concerned, I would like to suggest: Start Namzaric as discussed Start Wellbutrin ince daily and then twice daily as tolerated  I would like to see you back in 3-6 months, sooner if we need to. Please call us with any inter[place] questions, concerns, problems, updates or refill requests.   Please also call us for any test results so we can go over those with you on the phone.  My clinical assistant and will answer any of your questions and relay your messages to me and also relay most of my messages to you.   Our phone number is [phone]. We also have an after hours call service for urgent matters and there is a physician on-call for urgent questions. For any emergencies you know to call 911 or go to the nearest emergency room

## 2015-01-10 ENCOUNTER — Telehealth: Payer: Self-pay | Admitting: Neurology

## 2015-01-10 DIAGNOSIS — F0390 Unspecified dementia without behavioral disturbance: Secondary | ICD-10-CM | POA: Insufficient documentation

## 2015-01-10 NOTE — Telephone Encounter (Signed)
[name], can you call Dr. Juluis RainierElizabeth Barnes office and se eif patient has ever had a B12 level checked? If not, we should call family and let them know we should check this level and they can come to the office anytime, it has been ordered if needed. Thanks.

## 2015-01-11 NOTE — Telephone Encounter (Signed)
Thank you :)

## 2015-01-11 NOTE — Telephone Encounter (Signed)
Called Dr. Zachery DauerBarnes office. They checked to see if pt had a previous B12 level done. They stated pt has not had this done before.

## 2015-01-11 NOTE — Telephone Encounter (Signed)
Left detailed VM for daughter to let her know B12 lab needs to be done. Gave [facility] hours for when they can stop by. Asked her to call back if she has further questions. Advised that I called PCP and they had not done B12 level before. Gave [facility] phone number.

## 2015-01-26 ENCOUNTER — Inpatient Hospital Stay (HOSPITAL_COMMUNITY): Payer: Medicare Other

## 2015-01-26 ENCOUNTER — Inpatient Hospital Stay (HOSPITAL_COMMUNITY)
Admission: EM | Admit: 2015-01-26 | Discharge: 2015-01-30 | DRG: 470 | Disposition: A | Discharge status: Skilled Nursing Facility | Payer: Medicare Other | Attending: Internal Medicine | Admitting: Internal Medicine

## 2015-01-26 ENCOUNTER — Emergency Department (HOSPITAL_COMMUNITY): Payer: Medicare Other

## 2015-01-26 ENCOUNTER — Encounter (HOSPITAL_COMMUNITY): Payer: Self-pay | Admitting: Emergency Medicine

## 2015-01-26 DIAGNOSIS — D6489 Other specified anemias: Secondary | ICD-10-CM | POA: Diagnosis present

## 2015-01-26 DIAGNOSIS — Y92009 Unspecified place in unspecified non-institutional (private) residence as the place of occurrence of the external cause: Secondary | ICD-10-CM

## 2015-01-26 DIAGNOSIS — E1165 Type 2 diabetes mellitus with hyperglycemia: Secondary | ICD-10-CM | POA: Diagnosis present

## 2015-01-26 DIAGNOSIS — E785 Hyperlipidemia, unspecified: Secondary | ICD-10-CM | POA: Diagnosis present

## 2015-01-26 DIAGNOSIS — Z9181 History of falling: Secondary | ICD-10-CM | POA: Diagnosis not present

## 2015-01-26 DIAGNOSIS — T501X5A Adverse effect of loop [high-ceiling] diuretics, initial encounter: Secondary | ICD-10-CM | POA: Diagnosis present

## 2015-01-26 DIAGNOSIS — Z7902 Long term (current) use of antithrombotics/antiplatelets: Secondary | ICD-10-CM | POA: Diagnosis not present

## 2015-01-26 DIAGNOSIS — F0390 Unspecified dementia without behavioral disturbance: Secondary | ICD-10-CM | POA: Diagnosis present

## 2015-01-26 DIAGNOSIS — Z01818 Encounter for other preprocedural examination: Secondary | ICD-10-CM

## 2015-01-26 DIAGNOSIS — S72002A Fracture of unspecified part of neck of left femur, initial encounter for closed fracture: Secondary | ICD-10-CM | POA: Diagnosis present

## 2015-01-26 DIAGNOSIS — N179 Acute kidney failure, unspecified: Secondary | ICD-10-CM | POA: Diagnosis present

## 2015-01-26 DIAGNOSIS — I6309 Cerebral infarction due to thrombosis of other precerebral artery: Secondary | ICD-10-CM | POA: Diagnosis not present

## 2015-01-26 DIAGNOSIS — Z713 Dietary counseling and surveillance: Secondary | ICD-10-CM | POA: Diagnosis not present

## 2015-01-26 DIAGNOSIS — I350 Nonrheumatic aortic (valve) stenosis: Secondary | ICD-10-CM

## 2015-01-26 DIAGNOSIS — Z8249 Family history of ischemic heart disease and other diseases of the circulatory system: Secondary | ICD-10-CM | POA: Diagnosis not present

## 2015-01-26 DIAGNOSIS — Z87891 Personal history of nicotine dependence: Secondary | ICD-10-CM | POA: Diagnosis not present

## 2015-01-26 DIAGNOSIS — Z7982 Long term (current) use of aspirin: Secondary | ICD-10-CM | POA: Diagnosis not present

## 2015-01-26 DIAGNOSIS — R262 Difficulty in walking, not elsewhere classified: Secondary | ICD-10-CM | POA: Diagnosis present

## 2015-01-26 DIAGNOSIS — Z419 Encounter for procedure for purposes other than remedying health state, unspecified: Secondary | ICD-10-CM

## 2015-01-26 DIAGNOSIS — G9340 Encephalopathy, unspecified: Secondary | ICD-10-CM | POA: Diagnosis not present

## 2015-01-26 DIAGNOSIS — Z820 Family history of epilepsy and other diseases of the nervous system: Secondary | ICD-10-CM | POA: Diagnosis not present

## 2015-01-26 DIAGNOSIS — Z09 Encounter for follow-up examination after completed treatment for conditions other than malignant neoplasm: Secondary | ICD-10-CM

## 2015-01-26 DIAGNOSIS — Z7901 Long term (current) use of anticoagulants: Secondary | ICD-10-CM

## 2015-01-26 DIAGNOSIS — I2510 Atherosclerotic heart disease of native coronary artery without angina pectoris: Secondary | ICD-10-CM | POA: Diagnosis present

## 2015-01-26 DIAGNOSIS — R55 Syncope and collapse: Secondary | ICD-10-CM | POA: Diagnosis not present

## 2015-01-26 DIAGNOSIS — W19XXXA Unspecified fall, initial encounter: Secondary | ICD-10-CM | POA: Diagnosis present

## 2015-01-26 DIAGNOSIS — F329 Major depressive disorder, single episode, unspecified: Secondary | ICD-10-CM | POA: Diagnosis not present

## 2015-01-26 DIAGNOSIS — R011 Cardiac murmur, unspecified: Secondary | ICD-10-CM | POA: Diagnosis present

## 2015-01-26 DIAGNOSIS — I6319 Cerebral infarction due to embolism of other precerebral artery: Secondary | ICD-10-CM | POA: Diagnosis not present

## 2015-01-26 DIAGNOSIS — I25709 Atherosclerosis of coronary artery bypass graft(s), unspecified, with unspecified angina pectoris: Secondary | ICD-10-CM | POA: Diagnosis not present

## 2015-01-26 DIAGNOSIS — Z951 Presence of aortocoronary bypass graft: Secondary | ICD-10-CM

## 2015-01-26 DIAGNOSIS — E876 Hypokalemia: Secondary | ICD-10-CM | POA: Diagnosis present

## 2015-01-26 DIAGNOSIS — Z886 Allergy status to analgesic agent status: Secondary | ICD-10-CM | POA: Diagnosis not present

## 2015-01-26 DIAGNOSIS — S72009A Fracture of unspecified part of neck of unspecified femur, initial encounter for closed fracture: Secondary | ICD-10-CM | POA: Diagnosis present

## 2015-01-26 DIAGNOSIS — I10 Essential (primary) hypertension: Secondary | ICD-10-CM | POA: Diagnosis present

## 2015-01-26 DIAGNOSIS — E860 Dehydration: Secondary | ICD-10-CM | POA: Diagnosis present

## 2015-01-26 DIAGNOSIS — S72012A Unspecified intracapsular fracture of left femur, initial encounter for closed fracture: Secondary | ICD-10-CM | POA: Diagnosis present

## 2015-01-26 DIAGNOSIS — IMO0002 Reserved for concepts with insufficient information to code with codable children: Secondary | ICD-10-CM | POA: Diagnosis present

## 2015-01-26 DIAGNOSIS — S329XXA Fracture of unspecified parts of lumbosacral spine and pelvis, initial encounter for closed fracture: Secondary | ICD-10-CM

## 2015-01-26 DIAGNOSIS — IMO0001 Reserved for inherently not codable concepts without codable children: Secondary | ICD-10-CM

## 2015-01-26 LAB — COMPREHENSIVE METABOLIC PANEL
ALT: 14 U/L (ref 14–54)
AST: 32 U/L (ref 15–41)
Albumin: 3.3 g/dL — ABNORMAL LOW (ref 3.5–5.0)
Alkaline Phosphatase: 76 U/L (ref 38–126)
Anion gap: 12 (ref 5–15)
BUN: 10 mg/dL (ref 6–20)
CHLORIDE: 102 mmol/L (ref 101–111)
CO2: 27 mmol/L (ref 22–32)
CREATININE: 1.26 mg/dL — AB (ref 0.44–1.00)
Calcium: 9.4 mg/dL (ref 8.9–10.3)
GFR calc Af Amer: 44 mL/min — ABNORMAL LOW (ref >60)
GFR calc non Af Amer: 38 mL/min — ABNORMAL LOW (ref >60)
GLUCOSE: 170 mg/dL — AB (ref 65–99)
POTASSIUM: 2.5 mmol/L — AB (ref 3.5–5.1)
SODIUM: 141 mmol/L (ref 135–145)
Total Bilirubin: 0.5 mg/dL (ref 0.3–1.2)
Total Protein: 8 g/dL (ref 6.5–8.1)

## 2015-01-26 LAB — CBC WITH DIFFERENTIAL/PLATELET
BASOS ABS: 0 10*3/uL (ref 0.0–0.1)
Basophils Relative: 0 %
EOS ABS: 0.1 10*3/uL (ref 0.0–0.7)
EOS PCT: 1 %
HCT: 37.2 % (ref 36.0–46.0)
Hemoglobin: 12.1 g/dL (ref 12.0–15.0)
Lymphocytes Relative: 29 %
Lymphs Abs: 2 10*3/uL (ref 0.7–4.0)
MCH: 29.7 pg (ref 26.0–34.0)
MCHC: 32.5 g/dL (ref 30.0–36.0)
MCV: 91.4 fL (ref 78.0–100.0)
MONO ABS: 0.7 10*3/uL (ref 0.1–1.0)
Monocytes Relative: 10 %
Neutro Abs: 4.1 10*3/uL (ref 1.7–7.7)
Neutrophils Relative %: 60 %
PLATELETS: 285 10*3/uL (ref 150–400)
RBC: 4.07 MIL/uL (ref 3.87–5.11)
RDW: 12.7 % (ref 11.5–15.5)
WBC: 6.8 10*3/uL (ref 4.0–10.5)

## 2015-01-26 LAB — PROTIME-INR
INR: 1.05 (ref 0.00–1.49)
Prothrombin Time: 13.9 seconds (ref 11.6–15.2)

## 2015-01-26 LAB — CALCIUM: Calcium: 8.6 mg/dL — ABNORMAL LOW (ref 8.9–10.3)

## 2015-01-26 LAB — GLUCOSE, CAPILLARY
GLUCOSE-CAPILLARY: 153 mg/dL — AB (ref 65–99)
Glucose-Capillary: 137 mg/dL — ABNORMAL HIGH (ref 65–99)

## 2015-01-26 LAB — ALBUMIN: Albumin: 2.9 g/dL — ABNORMAL LOW (ref 3.5–5.0)

## 2015-01-26 LAB — MAGNESIUM: MAGNESIUM: 1.4 mg/dL — AB (ref 1.7–2.4)

## 2015-01-26 MED ORDER — ASPIRIN EC 81 MG PO TBEC
81.0000 mg | DELAYED_RELEASE_TABLET | Freq: Every day | ORAL | Status: DC
  Administered 2015-01-26 – 2015-01-27 (×2): 81 mg via ORAL
  Filled 2015-01-26 (×2): qty 1

## 2015-01-26 MED ORDER — POTASSIUM CHLORIDE CRYS ER 20 MEQ PO TBCR: 40.0000 meq | EXTENDED_RELEASE_TABLET | Freq: Once | ORAL | Status: DC

## 2015-01-26 MED ORDER — SODIUM CHLORIDE 0.9 % IV SOLN
INTRAVENOUS | Status: DC
  Administered 2015-01-26 – 2015-01-28 (×3): via INTRAVENOUS

## 2015-01-26 MED ORDER — AMLODIPINE BESYLATE 10 MG PO TABS
10.0000 mg | ORAL_TABLET | Freq: Every day | ORAL | Status: DC
  Administered 2015-01-27 – 2015-01-30 (×2): 10 mg via ORAL
  Filled 2015-01-26 (×4): qty 1

## 2015-01-26 MED ORDER — DOCUSATE SODIUM 100 MG PO CAPS
100.0000 mg | ORAL_CAPSULE | Freq: Two times a day (BID) | ORAL | Status: DC
  Administered 2015-01-26: 100 mg via ORAL
  Filled 2015-01-26: qty 1

## 2015-01-26 MED ORDER — OXYCODONE-ACETAMINOPHEN 5-325 MG PO TABS
1.0000 | ORAL_TABLET | ORAL | Status: DC | PRN
  Administered 2015-01-27: 2 via ORAL
  Administered 2015-01-27: 1 via ORAL
  Administered 2015-01-27: 2 via ORAL
  Filled 2015-01-26: qty 2
  Filled 2015-01-26: qty 1
  Filled 2015-01-26: qty 2

## 2015-01-26 MED ORDER — DULOXETINE HCL 60 MG PO CPEP
60.0000 mg | ORAL_CAPSULE | Freq: Every day | ORAL | Status: DC
  Administered 2015-01-27 – 2015-01-30 (×3): 60 mg via ORAL
  Filled 2015-01-26 (×3): qty 1

## 2015-01-26 MED ORDER — INSULIN ASPART 100 UNIT/ML ~~LOC~~ SOLN
0.0000 [IU] | SUBCUTANEOUS | Status: DC
  Administered 2015-01-26: 2 [IU] via SUBCUTANEOUS
  Administered 2015-01-26 – 2015-01-27 (×2): 3 [IU] via SUBCUTANEOUS
  Administered 2015-01-27: 2 [IU] via SUBCUTANEOUS
  Administered 2015-01-27: 5 [IU] via SUBCUTANEOUS
  Administered 2015-01-28: 2 [IU] via SUBCUTANEOUS
  Administered 2015-01-29 (×2): 8 [IU] via SUBCUTANEOUS
  Administered 2015-01-29 – 2015-01-30 (×2): 5 [IU] via SUBCUTANEOUS
  Administered 2015-01-30: 8 [IU] via SUBCUTANEOUS
  Administered 2015-01-30: 2 [IU] via SUBCUTANEOUS
  Filled 2015-01-26: qty 1

## 2015-01-26 MED ORDER — ASPIRIN 81 MG PO TABS: 81.0000 mg | ORAL_TABLET | Freq: Every day | ORAL | Status: DC

## 2015-01-26 MED ORDER — LORAZEPAM 0.5 MG PO TABS
0.5000 mg | ORAL_TABLET | Freq: Two times a day (BID) | ORAL | Status: DC
  Administered 2015-01-26 – 2015-01-30 (×7): 0.5 mg via ORAL
  Filled 2015-01-26 (×7): qty 1

## 2015-01-26 MED ORDER — MORPHINE SULFATE (PF) 2 MG/ML IV SOLN
0.5000 mg | INTRAVENOUS | Status: DC | PRN
  Administered 2015-01-26 – 2015-01-28 (×4): 1 mg via INTRAVENOUS
  Filled 2015-01-26 (×4): qty 1

## 2015-01-26 MED ORDER — OXYCODONE-ACETAMINOPHEN 5-325 MG PO TABS
2.0000 | ORAL_TABLET | Freq: Once | ORAL | Status: AC
  Administered 2015-01-27: 2 via ORAL
  Filled 2015-01-26: qty 2

## 2015-01-26 MED ORDER — MEMANTINE HCL 10 MG PO TABS
10.0000 mg | ORAL_TABLET | Freq: Two times a day (BID) | ORAL | Status: DC
  Administered 2015-01-26 – 2015-01-30 (×8): 10 mg via ORAL
  Filled 2015-01-26 (×9): qty 1

## 2015-01-26 MED ORDER — GABAPENTIN 100 MG PO CAPS
200.0000 mg | ORAL_CAPSULE | Freq: Two times a day (BID) | ORAL | Status: DC
  Administered 2015-01-26 – 2015-01-30 (×8): 200 mg via ORAL
  Filled 2015-01-26 (×8): qty 2

## 2015-01-26 NOTE — ED Notes (Signed)
Pt to ER via [facility] - pt to ER due to left hip pain after a fall Thursday and a fall Saturday. No obvious deformity to extremity. Today daughter noticed pt to be favoring the left leg. Pt received 100 mcg fentanyl in route. Pt pain 8/10 on arrival. VSS. A/o x4.
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
## 2015-01-26 NOTE — ED Provider Notes (Signed)
CSN: [id number]     Arrival date & time 01/26/15  1357 History   First MD Initiated Contact with Patient 01/26/15 1439     Chief Complaint  Patient presents with  . Fall  . Hip Pain     (Consider location/radiation/quality/duration/timing/severity/associated sxs/prior Treatment) HPI Patient fell partially 6 days ago. She reports she just landed against her bed in the wrong way. She did not think the injury was too serious and has not sought medical care. Her daughters report that she's continued to be in pain and has had difficulty walking. They report that she has fallen again due to pain in the leg. The patient thinks is not very serious and feels that the leg is functioning all right. She denies any other associated injury. Otherwise been well and at baseline. Past Medical History  Diagnosis Date  . Shortness of breath   . CAD (coronary artery disease)     Last catheterization 2009, grafts patent, EF 70%, 80% small OM with possible mild ischemic  . Hx of CABG     2000, LIMA to LAD, SVG to diagonal, SVG to OM, SVG to posterior descending  . Ejection fraction     EF 70%,, 2009 /  EF 65%, echo, October, 2011  . Hypertension   . Palpitations   . Dyslipidemia   . Diabetes mellitus   . Pancreatitis   . Carotid artery disease (HCC)     Mild, Doppler, 2000  . Dizziness     Evaluated April, 2008, no treatment needed  . Cervical spine disease     Severe neck pain, 2011  . Hyperkalemia     Her doctor Drema Dallas, September, 2012, ACE inhibitor stopped, probable amlodipine to be  . Shortness of breath   . History of hysterectomy   . Chest pain     Chest hurting, May, 2013   Past Surgical History  Procedure Laterality Date  . Coronary artery bypass graft  2000  . Cardiac catheterization  2009  . Cholecystectomy    . Anterior cervical decomp/discectomy fusion  11/30/2011    Procedure: ANTERIOR CERVICAL DECOMPRESSION/DISCECTOMY FUSION 1 LEVEL;  Surgeon: Eustace Moore, MD;  Location: [place]  NEURO ORS;  Service: Neurosurgery;  Laterality: N/A;  Anterior Cervical Decompression Discectomy Cervical Five-Six  . Left heart catheterization with coronary/graft angiogram N/A 05/29/2013    Procedure: LEFT HEART CATHETERIZATION WITH Beatrix Fetters;  Surgeon: Burnell Blanks, MD;  Location[facility] CATH LAB;  Service: Cardiovascular;  Laterality: N/A;   Family History  Problem Relation Age of Onset  . Cancer Mother   . Dementia Mother   . Heart failure Father   . Coronary artery disease Father   . Cancer      siblings  . Heart failure      siblings  . Dementia Brother   . Parkinsonism Brother    Social History  Substance Use Topics  . Smoking status: Never Smoker   . Smokeless tobacco: None     Comment: quit in the 1970's  . Alcohol Use: No   OB History    No data available     Review of Systems 10 Systems reviewed and are negative for acute change except as noted in the HPI.    Allergies  Codeine  Home Medications   Prior to Admission medications   Medication Sig Start Date End Date Taking? Authorizing Provider  ACCU-CHEK AVIVA PLUS test strip USE TO TEST YOUR BLOOD SUGAR 3-4 TIMES A DAY 11/02/14  Yes  Historical Provider, MD  amLODipine (NORVASC) 10 MG tablet Take 1 tablet (10 mg total) by mouth daily. 09/15/14  Yes Carlena Bjornstad, MD  aspirin 81 MG tablet Take 81 mg by mouth daily.     Yes Historical Provider, MD  atorvastatin (LIPITOR) 20 MG tablet Take 1 tablet (20 mg total) by mouth daily. 03/14/12  Yes Carlena Bjornstad, MD  Blood Glucose Monitoring Suppl (ACCU-CHEK AVIVA PLUS) W/DEVICE KIT  03/25/13  Yes Historical Provider, MD  buPROPion (WELLBUTRIN) 100 MG tablet Take 1 tablet (100 mg total) by mouth 2 (two) times daily. 01/08/15  Yes Melvenia Beam, MD  clopidogrel (PLAVIX) 75 MG tablet Take 1 tablet (75 mg total) by mouth daily. Continue on hold for 2 more weeks. 12/05/11  Yes Barton Dubois, MD  DULoxetine (CYMBALTA) 60 MG capsule Take 60 mg by mouth  daily.   Yes Historical Provider, MD  feeding supplement (ENSURE COMPLETE) LIQD Take 237 mLs by mouth 2 (two) times daily between meals. 12/05/11  Yes Barton Dubois, MD  feeding supplement (ENSURE) PUDG Take 1 Container by mouth 2 (two) times daily with a meal. 12/05/11  Yes Barton Dubois, MD  furosemide (LASIX) 20 MG tablet Take 20 mg by mouth as needed for fluid (PATIENT TAKES ONE TABLET BY MOUTH AS NEEDED FOR FLUID RETENSION).  08/25/13  Yes Historical Provider, MD  gabapentin (NEURONTIN) 100 MG capsule Take 200 mg by mouth 2 (two) times daily.   Yes Historical Provider, MD  glimepiride (AMARYL) 2 MG tablet Take 4 mg by mouth daily with breakfast.    Yes Historical Provider, MD  LORazepam (ATIVAN) 0.5 MG tablet Take 1 tablet (0.5 mg total) by mouth 2 (two) times daily. 12/14/11  Yes Daniel J Angiulli, PA-C  memantine (NAMENDA TITRATION PAK) tablet pack 5 mg/day for =1 week; 5 mg twice daily for =1 week; 15 mg/day given in 5 mg and 10 mg separated doses for =1 week; then 10 mg twice daily 01/08/15  Yes Melvenia Beam, MD  metFORMIN (GLUCOPHAGE-XR) 500 MG 24 hr tablet Take 500-1,000 mg by mouth daily with breakfast. 1000 mg in morning and 500 at  Night   Yes Historical Provider, MD  pantoprazole (PROTONIX) 40 MG tablet Take 1 tablet (40 mg total) by mouth daily. 09/15/14  Yes Carlena Bjornstad, MD  sitaGLIPtin (JANUVIA) 100 MG tablet Take 100 mg by mouth daily.     Yes Historical Provider, MD  Memantine HCl-Donepezil HCl (NAMZARIC) 28-10 MG CP24 Take 1 tablet by mouth at bedtime. 01/08/15   Melvenia Beam, MD  nitroGLYCERIN (NITROSTAT) 0.4 MG SL tablet Place 1 tablet (0.4 mg total) under the tongue every 5 (five) minutes as needed. For chest pain 04/18/13   Carlena Bjornstad, MD   BP 116/62 mmHg  Pulse 69  Temp(Src) 98.3 F (36.8 C)  Resp 23  SpO2 98% Physical Exam  Constitutional: She is oriented to person, place, and time. She appears well-developed and well-nourished.  HENT:  Head: Normocephalic and  atraumatic.  Right Ear: External ear normal.  Left Ear: External ear normal.  Mouth/Throat: Oropharynx is clear and moist.  Eyes: EOM are normal. Pupils are equal, round, and reactive to light.  Neck: Neck supple.  Cardiovascular: Normal rate, regular rhythm, normal heart sounds and intact distal pulses.   Pulmonary/Chest: Effort normal and breath sounds normal.  Abdominal: Soft. Bowel sounds are normal. She exhibits no distension. There is no tenderness.  Musculoskeletal: Normal range of motion. She exhibits no  edema.  Patient endorses some tender to palpation over the trochanter on the left. She however will flex and extend the leg spontaneously. No contusion or abrasion visible. No lower extremity edema.  Neurological: She is alert and oriented to person, place, and time. She has normal strength. Coordination normal. GCS eye subscore is 4. GCS verbal subscore is 5. GCS motor subscore is 6.  Skin: Skin is warm, dry and intact.  Psychiatric: She has a normal mood and affect.    ED Course  Procedures (including critical care time) Labs Review Labs Reviewed  CBC WITH DIFFERENTIAL/PLATELET  COMPREHENSIVE METABOLIC PANEL  PROTIME-INR    Imaging Review Dg Hip Unilat With Pelvis 2-3 Views Left  01/26/2015  CLINICAL DATA:  Fall at home one week ago with left hip pain. EXAM: DG HIP (WITH OR WITHOUT PELVIS) 2-3V LEFT COMPARISON:  None. FINDINGS: Examination demonstrates a minimally displaced subcapital to transcervical fracture of the left femoral neck could not exclude a nondisplaced fracture of the symphysis bilaterally. There are mild symmetric degenerative changes of the hips. There are degenerative changes of the spine. IMPRESSION: Minimally displaced left femoral neck fracture. Cannot exclude subtle fracture involving the symphysis bilaterally. Electronically Signed   By: Marin Olp M.D.   On: 01/26/2015 15:27   I have personally reviewed and evaluated these images and lab results as  part of my medical decision-making.   EKG Interpretation None     Consult: Orthopedic surgery Dr. Veverly Fells. Will see the patient emergency department. Consult: [facility], will admit for medical management. MDM   Final diagnoses:  Closed left hip fracture, initial encounter [facility])   Patient has a hip fracture from 6 days ago. She is alert and in no acute distress. There is no other apparent associated injury. She states all her other medical conditions are at baseline. She does not feel that she has been ill recently.    Charlesetta Shanks, MD 01/26/15 [id number]

## 2015-01-26 NOTE — Consult Note (Signed)
Reason for Consult:Broken left hip Referring Physician: EDP  [name] is an 81 y.o. female.  HPI: 81 yo female who fell several days ago who complains of increasing left hip and groin pain.  She denies any dizziness or shortness of breath that may have caused the fall.  " I just fell." Unable to ambulate now due to pain.  Past Medical History  Diagnosis Date  . Shortness of breath   . CAD (coronary artery disease)     Last catheterization 2009, grafts patent, EF 70%, 80% small OM with possible mild ischemic  . Hx of CABG     2000, LIMA to LAD, SVG to diagonal, SVG to OM, SVG to posterior descending  . Ejection fraction     EF 70%,, 2009 /  EF 65%, echo, October, 2011  . Hypertension   . Palpitations   . Dyslipidemia   . Diabetes mellitus   . Pancreatitis   . Carotid artery disease (HCC)     Mild, Doppler, 2000  . Dizziness     Evaluated April, 2008, no treatment needed  . Cervical spine disease     Severe neck pain, 2011  . Hyperkalemia     Her doctor Drema Dallas, September, 2012, ACE inhibitor stopped, probable amlodipine to be  . Shortness of breath   . History of hysterectomy   . Chest pain     Chest hurting, May, 2013    Past Surgical History  Procedure Laterality Date  . Coronary artery bypass graft  2000  . Cardiac catheterization  2009  . Cholecystectomy    . Anterior cervical decomp/discectomy fusion  11/30/2011    Procedure: ANTERIOR CERVICAL DECOMPRESSION/DISCECTOMY FUSION 1 LEVEL;  Surgeon: Eustace Moore, MD;  Location: [place] NEURO ORS;  Service: Neurosurgery;  Laterality: N/A;  Anterior Cervical Decompression Discectomy Cervical Five-Six  . Left heart catheterization with coronary/graft angiogram N/A 05/29/2013    Procedure: LEFT HEART CATHETERIZATION WITH Beatrix Fetters;  Surgeon: Burnell Blanks, MD;  Location: [facility] CATH LAB;  Service: Cardiovascular;  Laterality: N/A;    Family History  Problem Relation Age of Onset  . Cancer Mother   .  Dementia Mother   . Heart failure Father   . Coronary artery disease Father   . Cancer      siblings  . Heart failure      siblings  . Dementia Brother   . Parkinsonism Brother     Social History:  reports that she has never smoked. She does not have any smokeless tobacco history on file. She reports that she does not drink alcohol or use illicit drugs.  Allergies:  Allergies  Allergen Reactions  . Codeine Hives    Medications: I have reviewed the patient's current medications.  Results for orders placed or performed during the hospital encounter of 01/26/15 (from the past 48 hour(s))  CBC with Differential     Status: None   Collection Time: 01/26/15  4:40 PM  Result Value Ref Range   WBC 6.8 4.0 - 10.5 K/uL   RBC 4.07 3.87 - 5.11 MIL/uL   Hemoglobin 12.1 12.0 - 15.0 g/dL   HCT 37.2 36.0 - 46.0 %   MCV 91.4 78.0 - 100.0 fL   MCH 29.7 26.0 - 34.0 pg   MCHC 32.5 30.0 - 36.0 g/dL   RDW 12.7 11.5 - 15.5 %   Platelets 285 150 - 400 K/uL   Neutrophils Relative % 60 %   Neutro Abs 4.1 1.7 - 7.7 K/uL  Lymphocytes Relative 29 %   Lymphs Abs 2.0 0.7 - 4.0 K/uL   Monocytes Relative 10 %   Monocytes Absolute 0.7 0.1 - 1.0 K/uL   Eosinophils Relative 1 %   Eosinophils Absolute 0.1 0.0 - 0.7 K/uL   Basophils Relative 0 %   Basophils Absolute 0.0 0.0 - 0.1 K/uL  Comprehensive metabolic panel     Status: Abnormal   Collection Time: 01/26/15  4:40 PM  Result Value Ref Range   Sodium 141 135 - 145 mmol/L   Potassium 2.5 (LL) 3.5 - 5.1 mmol/L    Comment: CRITICAL RESULT CALLED TO, READ BACK BY AND VERIFIED WITH: H HALL,RN 1722 01/26/2015 WBOND    Chloride 102 101 - 111 mmol/L   CO2 27 22 - 32 mmol/L   Glucose, Bld 170 (H) 65 - 99 mg/dL   BUN 10 6 - 20 mg/dL   Creatinine, Ser 1.26 (H) 0.44 - 1.00 mg/dL   Calcium 9.4 8.9 - 10.3 mg/dL   Total Protein 8.0 6.5 - 8.1 g/dL   Albumin 3.3 (L) 3.5 - 5.0 g/dL   AST 32 15 - 41 U/L   ALT 14 14 - 54 U/L   Alkaline Phosphatase 76 38 -  126 U/L   Total Bilirubin 0.5 0.3 - 1.2 mg/dL   GFR calc non Af Amer 38 (L) >60 mL/min   GFR calc Af Amer 44 (L) >60 mL/min    Comment: (NOTE) The eGFR has been calculated using the CKD EPI equation. This calculation has not been validated in all clinical situations. eGFR's persistently <60 mL/min signify possible Chronic Kidney Disease.    Anion gap 12 5 - 15  Protime-INR     Status: None   Collection Time: 01/26/15  4:40 PM  Result Value Ref Range   Prothrombin Time 13.9 11.6 - 15.2 seconds   INR 1.05 0.00 - 1.49    Dg Chest Port 1 View  01/26/2015  CLINICAL DATA:  Preop.  Left femoral neck fracture. EXAM: PORTABLE CHEST 1 VIEW COMPARISON:  07/03/2013 FINDINGS: Sequelae of prior CABG are again identified. The patient has taken a shallower inspiration than on the prior study and there is bronchovascular crowding with mild left basilar atelectasis. No pleural effusion or pneumothorax is identified. Surgical clips are noted in the region of the GE junction. IMPRESSION: Shallow inspiration with left basilar atelectasis. Electronically Signed   By: Logan Bores M.D.   On: 01/26/2015 16:34   Dg Hip Unilat With Pelvis 2-3 Views Left  01/26/2015  CLINICAL DATA:  Fall at home one week ago with left hip pain. EXAM: DG HIP (WITH OR WITHOUT PELVIS) 2-3V LEFT COMPARISON:  None. FINDINGS: Examination demonstrates a minimally displaced subcapital to transcervical fracture of the left femoral neck could not exclude a nondisplaced fracture of the symphysis bilaterally. There are mild symmetric degenerative changes of the hips. There are degenerative changes of the spine. IMPRESSION: Minimally displaced left femoral neck fracture. Cannot exclude subtle fracture involving the symphysis bilaterally. Electronically Signed   By: Marin Olp M.D.   On: 01/26/2015 15:27    ROS Blood pressure 116/62, pulse 69, temperature 98.3 F (36.8 C), resp. rate 23, SpO2 98 %. Physical Exam AAO in NAD on ED stretcher,  neck T and L spine non tender with no deformity or step off.  Bilateral UEs with normal ROM and 5/5 motor strength.  Right LE with normal AROM and no pain, NVI. Left LE painful with log roll and knee flexion (  hurts at the hip), leg not particularly shortened. NVI distally  Assessment/Plan: Displaced subcapital femoral neck fracture in 81 yo female on Plavix with heart history.  I have discussed this patient with Dr Rod Can, ortho hip replacement specialist who has agreed to assume her care.  Surgery likely planned on Thursday due to Plavix which needs to be held, and due to surgical scheduling. Medical clearance and optimization pending.  She appears to be dehydrated and mildly malnourished.  Thank you !  Glorianne Proctor,STEVEN R 01/26/2015, 5:35 PM

## 2015-01-27 ENCOUNTER — Inpatient Hospital Stay (HOSPITAL_COMMUNITY): Payer: Medicare Other

## 2015-01-27 DIAGNOSIS — I350 Nonrheumatic aortic (valve) stenosis: Secondary | ICD-10-CM

## 2015-01-27 DIAGNOSIS — F0390 Unspecified dementia without behavioral disturbance: Secondary | ICD-10-CM

## 2015-01-27 DIAGNOSIS — R55 Syncope and collapse: Secondary | ICD-10-CM

## 2015-01-27 DIAGNOSIS — E1165 Type 2 diabetes mellitus with hyperglycemia: Secondary | ICD-10-CM

## 2015-01-27 DIAGNOSIS — S72002A Fracture of unspecified part of neck of left femur, initial encounter for closed fracture: Secondary | ICD-10-CM

## 2015-01-27 DIAGNOSIS — I10 Essential (primary) hypertension: Secondary | ICD-10-CM

## 2015-01-27 LAB — URINE MICROSCOPIC-ADD ON

## 2015-01-27 LAB — CBC
HEMATOCRIT: 36.4 % (ref 36.0–46.0)
Hemoglobin: 11.8 g/dL — ABNORMAL LOW (ref 12.0–15.0)
MCH: 29.7 pg (ref 26.0–34.0)
MCHC: 32.4 g/dL (ref 30.0–36.0)
MCV: 91.7 fL (ref 78.0–100.0)
PLATELETS: 245 10*3/uL (ref 150–400)
RBC: 3.97 MIL/uL (ref 3.87–5.11)
RDW: 13 % (ref 11.5–15.5)
WBC: 8.9 10*3/uL (ref 4.0–10.5)

## 2015-01-27 LAB — GLUCOSE, CAPILLARY
GLUCOSE-CAPILLARY: 221 mg/dL — AB (ref 65–99)
GLUCOSE-CAPILLARY: 144 mg/dL — AB (ref 65–99)
Glucose-Capillary: 103 mg/dL — ABNORMAL HIGH (ref 65–99)
Glucose-Capillary: 183 mg/dL — ABNORMAL HIGH (ref 65–99)
Glucose-Capillary: 67 mg/dL (ref 65–99)
Glucose-Capillary: 97 mg/dL (ref 65–99)

## 2015-01-27 LAB — BASIC METABOLIC PANEL
ANION GAP: 12 (ref 5–15)
BUN: 10 mg/dL (ref 6–20)
CALCIUM: 8.8 mg/dL — AB (ref 8.9–10.3)
CO2: 23 mmol/L (ref 22–32)
CREATININE: 1.3 mg/dL — AB (ref 0.44–1.00)
Chloride: 106 mmol/L (ref 101–111)
GFR calc Af Amer: 42 mL/min — ABNORMAL LOW (ref >60)
GFR calc non Af Amer: 37 mL/min — ABNORMAL LOW (ref >60)
GLUCOSE: 65 mg/dL (ref 65–99)
Potassium: 2.5 mmol/L — CL (ref 3.5–5.1)
Sodium: 141 mmol/L (ref 135–145)

## 2015-01-27 LAB — URINALYSIS, ROUTINE W REFLEX MICROSCOPIC
GLUCOSE, UA: 100 mg/dL — AB
Hgb urine dipstick: NEGATIVE
Ketones, ur: 15 mg/dL — AB
Nitrite: NEGATIVE
PROTEIN: 30 mg/dL — AB
SPECIFIC GRAVITY, URINE: 1.031 — AB (ref 1.005–1.030)
pH: 5 (ref 5.0–8.0)

## 2015-01-27 LAB — HEMOGLOBIN A1C
HEMOGLOBIN A1C: 8.4 % — AB (ref 4.8–5.6)
MEAN PLASMA GLUCOSE: 194 mg/dL

## 2015-01-27 LAB — MAGNESIUM: Magnesium: 1.1 mg/dL — ABNORMAL LOW (ref 1.7–2.4)

## 2015-01-27 MED ORDER — TRANEXAMIC ACID 1000 MG/10ML IV SOLN
1000.0000 mg | INTRAVENOUS | Status: AC
  Administered 2015-01-28: 1000 mg via INTRAVENOUS
  Filled 2015-01-27: qty 10

## 2015-01-27 MED ORDER — POTASSIUM CHLORIDE CRYS ER 20 MEQ PO TBCR
40.0000 meq | EXTENDED_RELEASE_TABLET | Freq: Every day | ORAL | Status: DC
  Administered 2015-01-27 – 2015-01-30 (×4): 40 meq via ORAL
  Filled 2015-01-27 (×4): qty 2

## 2015-01-27 MED ORDER — CEFAZOLIN SODIUM 1-5 GM-% IV SOLN
1.0000 g | Freq: Three times a day (TID) | INTRAVENOUS | Status: DC
  Administered 2015-01-28: 2 g via INTRAVENOUS
  Administered 2015-01-28: 1 g via INTRAVENOUS
  Filled 2015-01-27 (×3): qty 50

## 2015-01-27 MED ORDER — LORAZEPAM 1 MG PO TABS
1.0000 mg | ORAL_TABLET | Freq: Once | ORAL | Status: AC
  Administered 2015-01-27: 1 mg via ORAL

## 2015-01-27 MED ORDER — POTASSIUM CHLORIDE 10 MEQ/100ML IV SOLN
10.0000 meq | INTRAVENOUS | Status: AC
  Administered 2015-01-27 (×4): 10 meq via INTRAVENOUS
  Filled 2015-01-27 (×4): qty 100

## 2015-01-27 MED ORDER — MAGNESIUM SULFATE 2 GM/50ML IV SOLN
2.0000 g | Freq: Once | INTRAVENOUS | Status: AC
  Administered 2015-01-27: 2 g via INTRAVENOUS
  Filled 2015-01-27: qty 50

## 2015-01-27 MED ORDER — ENSURE ENLIVE PO LIQD
237.0000 mL | Freq: Three times a day (TID) | ORAL | Status: DC
  Administered 2015-01-27 – 2015-01-30 (×7): 237 mL via ORAL

## 2015-01-27 MED ORDER — LORAZEPAM 1 MG PO TABS
ORAL_TABLET | ORAL | Status: AC
  Filled 2015-01-27: qty 1

## 2015-01-27 MED ORDER — POLYETHYLENE GLYCOL 3350 17 G PO PACK
17.0000 g | PACK | Freq: Every day | ORAL | Status: DC
  Administered 2015-01-29 – 2015-01-30 (×2): 17 g via ORAL
  Filled 2015-01-27 (×3): qty 1

## 2015-01-27 NOTE — Progress Notes (Signed)
Utilization review completed.  

## 2015-01-27 NOTE — Progress Notes (Addendum)
[facility] PROGRESS NOTE  [name] FAO:[id number] DOB: 12-28-1928 DOA: 01/26/2015 PCP: Gaye Alken, MD  Assessment/Plan: Fracture of femoral neck, left:following a mechanical fall-patient never lost consciousness therefore doubt syncope. Ortho consulted for repair-does have a systolic murmur-await Echo-if no major findings on Echo-patient should be ok to proceed with surgery without any further work up. Patient likely atleast at moderate risk for the surgical procedure. Note LHC on 05/29/13 showed all 4 grafts to be patent.  Aortic stenosis -Mild stenosis on previous echo 4/15 -Repeat echo pending  CAD -CABG 2000, Cath 2009,3/15 with patent grafts -Hold Plavix and aspirin with anticipation of surgical fixation  Mild acute kidney injury:  -?pre-renal azotemia-but could have mild CKD due to DM. Check UA, repeat lytes in am  Hypokalemia:?secondary to lasix use.Will replete both Mg and K,Recheck in the AM  Hypertension -stable -continue on amlodipine  Diabetes mellitus type II -A1c 8.4-CBG's stable with SSI-continue to hold oral hypoglycemics. Follow and adjust accordingly.   Dementia without behavioral disturbance -stable-mental status at baseline-continue Namenda. At risk for delirium-follow closely-will need to minimize narcotics/sedatives.   Code Status: Full code Family Communication: None at bedside Disposition Plan: Inpatient   Consultants: Orthopedics  Procedures:  none  Antibiotics:  Ancef, start 11/17 surgical propylaxis  HPI/Subjective:   Objective: Filed Vitals:   01/27/15 1223  BP: 111/52  Pulse: 79  Temp: 98.3 F (36.8 C)  Resp: 18    Intake/Output Summary (Last 24 hours) at 01/27/15 1349 Last data filed at 01/27/15 0700  Gross per 24 hour  Intake   1065 ml  Output    150 ml  Net    915 ml   There were no vitals filed for this visit.  Exam:   General:  In no acute distress, appears stated age.  Neck: Supple,  no masses.  Head:normocephalic  Cardiovascular: Regular rate and rhythm, systolic murmur, no edema.  Respiratory: Normal effort, no wheezes, no rhonchi.  Abdomen: Soft , non-tender , non-distended, no masses.  Skin:Warm, dry, intact.  Psychiatric: normal affect and mood, awake , alert and oriented.   Data Reviewed: Basic Metabolic Panel:  Recent Labs Lab 01/26/15 1611 01/26/15 1640 01/26/15 2055 01/27/15 0732  NA  --  141  --  141  K  --  2.5*  --  2.5*  CL  --  102  --  106  CO2  --  27  --  23  GLUCOSE  --  170*  --  65  BUN  --  10  --  10  CREATININE  --  1.26*  --  1.30*  CALCIUM  --  9.4 8.6* 8.8*  MG 1.4*  --   --  1.1*   Liver Function Tests:  Recent Labs Lab 01/26/15 1640 01/26/15 2055  AST 32  --   ALT 14  --   ALKPHOS 76  --   BILITOT 0.5  --   PROT 8.0  --   ALBUMIN 3.3* 2.9*   No results for input(s): LIPASE, AMYLASE in the last 168 hours. No results for input(s): AMMONIA in the last 168 hours. CBC:  Recent Labs Lab 01/26/15 1640 01/27/15 0732  WBC 6.8 8.9  NEUTROABS 4.1  --   HGB 12.1 11.8*  HCT 37.2 36.4  MCV 91.4 91.7  PLT 285 245   Cardiac Enzymes: No results for input(s): CKTOTAL, CKMB, CKMBINDEX, TROPONINI in the last 168 hours. BNP (last 3 results) No results for input(s): BNP in the last 8760  hours.  ProBNP (last 3 results) No results for input(s): PROBNP in the last 8760 hours.  CBG:  Recent Labs Lab 2015/02/02 2057 01/27/15 0038 01/27/15 0420 01/27/15 0801 01/27/15 1143  GLUCAP 153* 103* 97 67 221*    No results found for this or any previous visit (from the past 240 hour(s)).   Studies: Dg Knee 1-2 Views Left  February 02, 2015  CLINICAL DATA:  Acute onset of left hip pain after fall. Initial encounter. EXAM: LEFT KNEE - 1-2 VIEW COMPARISON:  None. FINDINGS: No acute fracture is seen at the knee. The distal femur appears intact. A small knee joint effusion is noted. There is suggestion of mild lateral compartment  narrowing. No definite soft tissue abnormalities are otherwise characterized. IMPRESSION: 1. No evidence of fracture or dislocation. 2. Small knee joint effusion noted. 3. Suggestion of mild lateral compartment narrowing. Electronically Signed   By: Roanna Raider M.D.   On: 2015-02-02 18:52   Ct Pelvis Wo Contrast  Feb 02, 2015  CLINICAL DATA:  Left hip and pelvic pain, after collapsing when standing up out of bed. Question for pubic symphysis fracture on radiograph. Further evaluation requested. Initial encounter. EXAM: CT PELVIS WITHOUT CONTRAST TECHNIQUE: Multidetector CT imaging of the pelvis was performed following the standard protocol without intravenous contrast. COMPARISON:  Left hip radiographs performed earlier today at 2:59 p.m. FINDINGS: There is a minimally displaced subcapital fracture through the left femoral neck, with slight comminution. No additional fracture is seen. The pubic symphysis demonstrates mild degenerative change but is otherwise unremarkable in appearance. The right hip joint appears intact. Degenerative change is noted at the lower lumbar spine, with vacuum phenomenon at L4-L5 and L5-S1. The sacroiliac joints are grossly unremarkable, aside from mild sclerotic change at the left sacroiliac joint. Scattered vascular calcifications are seen. Diffuse diverticulosis is noted along the distal descending and sigmoid colon. A clip is noted at the right inguinal region. No definite significant soft tissue injury is characterized. No focal soft tissue hematoma is seen. IMPRESSION: 1. Minimally displaced subcapital fracture through the left femoral neck, with slight comminution. No additional fracture seen. 2. Degenerative change at the lower lumbar spine. 3. Scattered vascular calcifications seen. 4. Diffuse diverticulosis along the distal descending and sigmoid colon. Electronically Signed   By: Roanna Raider M.D.   On: 02-Feb-2015 18:19   Dg Chest Port 1 View  02-02-15  CLINICAL  DATA:  Preop.  Left femoral neck fracture. EXAM: PORTABLE CHEST 1 VIEW COMPARISON:  07/03/2013 FINDINGS: Sequelae of prior CABG are again identified. The patient has taken a shallower inspiration than on the prior study and there is bronchovascular crowding with mild left basilar atelectasis. No pleural effusion or pneumothorax is identified. Surgical clips are noted in the region of the GE junction. IMPRESSION: Shallow inspiration with left basilar atelectasis. Electronically Signed   By: Sebastian Ache M.D.   On: 02/02/15 16:34   Dg Hip Unilat With Pelvis 2-3 Views Left  2015/02/02  CLINICAL DATA:  Fall at home one week ago with left hip pain. EXAM: DG HIP (WITH OR WITHOUT PELVIS) 2-3V LEFT COMPARISON:  None. FINDINGS: Examination demonstrates a minimally displaced subcapital to transcervical fracture of the left femoral neck could not exclude a nondisplaced fracture of the symphysis bilaterally. There are mild symmetric degenerative changes of the hips. There are degenerative changes of the spine. IMPRESSION: Minimally displaced left femoral neck fracture. Cannot exclude subtle fracture involving the symphysis bilaterally. Electronically Signed   By: Elberta Fortis M.D.  On: 01/26/2015 15:27    Scheduled Meds: . amLODipine  10 mg Oral Daily  . aspirin EC  81 mg Oral Daily  . [START ON 01/28/2015]  ceFAZolin (ANCEF) IV  1 g Intravenous 3 times per day  . DULoxetine  60 mg Oral Daily  . feeding supplement (ENSURE ENLIVE)  237 mL Oral TID BM  . gabapentin  200 mg Oral BID  . insulin aspart  0-15 Units Subcutaneous 6 times per day  . LORazepam  0.5 mg Oral BID  . memantine  10 mg Oral BID  . polyethylene glycol  17 g Oral Daily  . potassium chloride  40 mEq Oral Daily  . [START ON 01/28/2015] tranexamic acid  (ORTHO-IV)  1,000 mg Intravenous To OR   Continuous Infusions: . sodium chloride 100 mL/hr at 01/26/15 2058    Principal Problem:   Fracture of femoral neck, left (HCC) Active  Problems:   CAD- CABG '00. Cath in '09 and 05/29/13- patent grafts   Hypertension   Dyslipidemia   Diabetes mellitus type II, uncontrolled (HCC)   Aortic stenosis   Dementia without behavioral disturbance   Fall at home   Left displaced femoral neck fracture (HCC)    Time spent: 30 minutes  Samah Lapiana  [facility]  If 7PM-7AM, please contact night-coverage at [email or web address], password [email or web address] 01/27/2015, 1:49 PM  LOS: 1 day

## 2015-01-27 NOTE — Progress Notes (Signed)
   Subjective:  Patient reports left hip pain as moderate to severe.    Objective:   VITALS:   Filed Vitals:   01/26/15 1545 01/26/15 1900 01/26/15 2138 01/27/15 0500  BP: 116/62 116/55 139/51 112/51  Pulse: 69 70 73 73  Temp:  98.2 F (36.8 C) 98.4 F (36.9 C) 98.6 F (37 C)  TempSrc:  Oral Oral Oral  Resp: 23 18 17 18   SpO2: 98% 100% 97% 94%    ABD soft LLE: skin intact. Pain with logroll. + TA/GS/EHL. SILT. 2+ DP.  Lab Results  Component Value Date   WBC 6.8 01/26/2015   HGB 12.1 01/26/2015   HCT 37.2 01/26/2015   MCV 91.4 01/26/2015   PLT 285 01/26/2015   BMET    Component Value Date/Time   NA 141 01/26/2015 1640   K 2.5* 01/26/2015 1640   CL 102 01/26/2015 1640   CO2 27 01/26/2015 1640   GLUCOSE 170* 01/26/2015 1640   BUN 10 01/26/2015 1640   CREATININE 1.26* 01/26/2015 1640   CALCIUM 8.6* 01/26/2015 2055   GFRNONAA 38* 01/26/2015 1640   GFRAA 44* 01/26/2015 1640     Assessment/Plan:     Principal Problem:   Fracture of femoral neck, left (HCC) Active Problems:   CAD- CABG '00. Cath in '09 and 05/29/13- patent grafts   Hypertension   Dyslipidemia   Diabetes mellitus type II, uncontrolled (HCC)   Aortic stenosis   Dementia without behavioral disturbance   Fall at home   Left displaced femoral neck fracture (HCC)    Plan for surgery tomorrow. We discussed left hip hemiarthroplasty vs THA NPO after MN tonight Hospitalist for perioperative risk stratification / medical optimization    The risks, benefits, and alternatives were discussed with the patient. There are risks associated with the surgery including, but not limited to, problems with anesthesia (death), infection, instability (giving out of the joint), dislocation, differences in leg length/angulation/rotation, fracture of bones, loosening or failure of implants, hematoma (blood accumulation) which may require surgical drainage, blood clots, pulmonary embolism, nerve injury (foot drop and  lateral thigh numbness), and blood vessel injury. The patient understands these risks and elects to proceed.    Demaya Hardge, Cloyde ReamsBrian James 01/27/2015, 7:40 AM   Samson FredericBrian Farren Landa, MD Cell [phone]

## 2015-01-27 NOTE — Progress Notes (Signed)
  Echocardiogram 2D Echocardiogram has been performed.  Sheralyn BoatmanWest, Lindsee Labarre R 01/27/2015, 10:20 AM

## 2015-01-27 NOTE — Progress Notes (Signed)
Notified by lab of critical potassium value of 2.5. Text paged Dr. Jerral RalphGhimire who placed orders of oral and IV potassium supplementation. Will continue to monitor. Sedonia SmallKatelin E Sawicki, RN

## 2015-01-27 NOTE — Progress Notes (Signed)
Initial Nutrition Assessment  DOCUMENTATION CODES:   Not applicable  INTERVENTION:  Provide Ensure Enlive po TID, each supplement provides 350 kcal and 20 grams of protein.  Encourage adequate PO intake.   NUTRITION DIAGNOSIS:   Inadequate oral intake related to poor appetite as evidenced by meal completion < 25%.  GOAL:   Patient will meet greater than or equal to 90% of their needs  MONITOR:   PO intake, Supplement acceptance, Weight trends, Labs, I & O's  REASON FOR ASSESSMENT:   Consult Assessment of nutrition requirement/status  ASSESSMENT:   81 y.o. female with a Past Medical History of CAD/CABG, HTN, HLD, DM, syncope, who presents with hip fracture and possible recurring syncopal episodes.   Pt reports appetite is currently just "ok". No percent meal completion recorded, however RN at bedside reports po intake ~25%. Pt reports she has been eating well PTA with usual consumption of 2-3 meals daily with Ensure 3 times daily. No recent weight recorded, thus pt was weighed on bed scale during time of visit which revealed 152 lbs. Pt with no weight loss. RD to order Ensure to aid in caloric and protein needs. Plans for surgery tomorrow.   Nutrition-Focused physical exam completed. Findings are no fat depletion, moderate to severe muscle depletion, and mild edema.   Labs and medications reviewed.   Diet Order:  Diet Heart Room service appropriate?: Yes; Fluid consistency:: Thin  Skin:  Reviewed, no issues  Last BM:  11/15  Height:   Ht Readings from Last 1 Encounters:  01/08/15 5\' 7"  (1.702 m)    Weight:   Wt Readings from Last 1 Encounters:  01/08/15 140 lb 3.2 oz (63.594 kg)  01/27/15 152 lbs (bed scale)  Ideal Body Weight:  61 kg  BMI:  Body Mass Index: 23.9 kg/(m^2)  Estimated Nutritional Needs:   Kcal:  1700-1850  Protein:  75-85 grams  Fluid:  1.7 - 1.8 L/day  EDUCATION NEEDS:   Education needs addressed  Roslyn SmilingStephanie Jermy Couper, MS, RD,  LDN Pager # [phone] After hours/ weekend pager # [phone]

## 2015-01-28 ENCOUNTER — Inpatient Hospital Stay (HOSPITAL_COMMUNITY): Payer: Medicare Other

## 2015-01-28 ENCOUNTER — Encounter (HOSPITAL_COMMUNITY): Payer: Self-pay | Admitting: Anesthesiology

## 2015-01-28 ENCOUNTER — Inpatient Hospital Stay (HOSPITAL_COMMUNITY): Payer: Medicare Other | Admitting: Anesthesiology

## 2015-01-28 ENCOUNTER — Encounter (HOSPITAL_COMMUNITY)
Admission: EM | Disposition: A | Discharge status: Skilled Nursing Facility | Payer: Self-pay | Source: Home / Self Care | Attending: Internal Medicine

## 2015-01-28 DIAGNOSIS — I2510 Atherosclerotic heart disease of native coronary artery without angina pectoris: Secondary | ICD-10-CM

## 2015-01-28 DIAGNOSIS — S72002A Fracture of unspecified part of neck of left femur, initial encounter for closed fracture: Secondary | ICD-10-CM | POA: Diagnosis present

## 2015-01-28 HISTORY — PX: HIP ARTHROPLASTY: SHX981

## 2015-01-28 LAB — CBC
HCT: 30.3 % — ABNORMAL LOW (ref 36.0–46.0)
Hemoglobin: 9.6 g/dL — ABNORMAL LOW (ref 12.0–15.0)
MCH: 29.4 pg (ref 26.0–34.0)
MCHC: 31.7 g/dL (ref 30.0–36.0)
MCV: 92.7 fL (ref 78.0–100.0)
PLATELETS: 237 10*3/uL (ref 150–400)
RBC: 3.27 MIL/uL — AB (ref 3.87–5.11)
RDW: 12.9 % (ref 11.5–15.5)
WBC: 9.3 10*3/uL (ref 4.0–10.5)

## 2015-01-28 LAB — GLUCOSE, CAPILLARY
GLUCOSE-CAPILLARY: 127 mg/dL — AB (ref 65–99)
GLUCOSE-CAPILLARY: 87 mg/dL (ref 65–99)
Glucose-Capillary: 89 mg/dL (ref 65–99)
Glucose-Capillary: 90 mg/dL (ref 65–99)
Glucose-Capillary: 89 mg/dL (ref 65–99)

## 2015-01-28 LAB — BASIC METABOLIC PANEL
Anion gap: 9 (ref 5–15)
BUN: 15 mg/dL (ref 6–20)
CALCIUM: 8.6 mg/dL — AB (ref 8.9–10.3)
CO2: 22 mmol/L (ref 22–32)
Chloride: 106 mmol/L (ref 101–111)
Creatinine, Ser: 1.19 mg/dL — ABNORMAL HIGH (ref 0.44–1.00)
GFR calc Af Amer: 47 mL/min — ABNORMAL LOW (ref >60)
GFR, EST NON AFRICAN AMERICAN: 41 mL/min — AB (ref >60)
GLUCOSE: 111 mg/dL — AB (ref 65–99)
Potassium: 3.4 mmol/L — ABNORMAL LOW (ref 3.5–5.1)
Sodium: 137 mmol/L (ref 135–145)

## 2015-01-28 LAB — MAGNESIUM: Magnesium: 1.6 mg/dL — ABNORMAL LOW (ref 1.7–2.4)

## 2015-01-28 LAB — TYPE AND SCREEN
ABO/RH(D): O POS
ANTIBODY SCREEN: NEGATIVE

## 2015-01-28 LAB — ABO/RH: ABO/RH(D): O POS

## 2015-01-28 LAB — VITAMIN D 25 HYDROXY (VIT D DEFICIENCY, FRACTURES): VIT D 25 HYDROXY: 20.6 ng/mL — AB (ref 30.0–100.0)

## 2015-01-28 SURGERY — HEMIARTHROPLASTY, HIP, DIRECT ANTERIOR APPROACH, FOR FRACTURE
Anesthesia: General | Site: Hip | Laterality: Left

## 2015-01-28 MED ORDER — ONDANSETRON HCL 4 MG/2ML IJ SOLN: 4.0000 mg | Freq: Once | INTRAMUSCULAR | Status: DC | PRN

## 2015-01-28 MED ORDER — HYDROCODONE-ACETAMINOPHEN 5-325 MG PO TABS
1.0000 | ORAL_TABLET | Freq: Four times a day (QID) | ORAL | Status: DC | PRN
  Administered 2015-01-28 – 2015-01-29 (×2): 2 via ORAL
  Filled 2015-01-28 (×2): qty 2

## 2015-01-28 MED ORDER — 0.9 % SODIUM CHLORIDE (POUR BTL) OPTIME
TOPICAL | Status: DC | PRN
  Administered 2015-01-28: 1000 mL

## 2015-01-28 MED ORDER — GLYCOPYRROLATE 0.2 MG/ML IJ SOLN
INTRAMUSCULAR | Status: AC
  Filled 2015-01-28: qty 1

## 2015-01-28 MED ORDER — LACTATED RINGERS IV SOLN
INTRAVENOUS | Status: DC | PRN
  Administered 2015-01-28: 18:00:00 via INTRAVENOUS

## 2015-01-28 MED ORDER — MENTHOL 3 MG MT LOZG: 1.0000 | LOZENGE | OROMUCOSAL | Status: DC | PRN

## 2015-01-28 MED ORDER — ONDANSETRON HCL 4 MG PO TABS: 4.0000 mg | ORAL_TABLET | Freq: Four times a day (QID) | ORAL | Status: DC | PRN

## 2015-01-28 MED ORDER — SUGAMMADEX SODIUM 500 MG/5ML IV SOLN
INTRAVENOUS | Status: AC
  Filled 2015-01-28: qty 5

## 2015-01-28 MED ORDER — PROPOFOL 10 MG/ML IV BOLUS
INTRAVENOUS | Status: AC
  Filled 2015-01-28: qty 20

## 2015-01-28 MED ORDER — CEFAZOLIN SODIUM-DEXTROSE 2-3 GM-% IV SOLR
2.0000 g | Freq: Four times a day (QID) | INTRAVENOUS | Status: AC
  Administered 2015-01-29 (×2): 2 g via INTRAVENOUS
  Filled 2015-01-28 (×2): qty 50

## 2015-01-28 MED ORDER — SUGAMMADEX SODIUM 200 MG/2ML IV SOLN: INTRAVENOUS | Status: DC | PRN

## 2015-01-28 MED ORDER — POTASSIUM CHLORIDE 10 MEQ/100ML IV SOLN
10.0000 meq | INTRAVENOUS | Status: AC
  Administered 2015-01-28 (×2): 10 meq via INTRAVENOUS
  Filled 2015-01-28 (×2): qty 100

## 2015-01-28 MED ORDER — METOCLOPRAMIDE HCL 5 MG PO TABS: 5.0000 mg | ORAL_TABLET | Freq: Three times a day (TID) | ORAL | Status: DC | PRN

## 2015-01-28 MED ORDER — ACETAMINOPHEN 325 MG PO TABS
650.0000 mg | ORAL_TABLET | Freq: Four times a day (QID) | ORAL | Status: DC | PRN
  Administered 2015-01-29: 650 mg via ORAL
  Filled 2015-01-28: qty 2

## 2015-01-28 MED ORDER — ONDANSETRON HCL 4 MG/2ML IJ SOLN
INTRAMUSCULAR | Status: DC | PRN
  Administered 2015-01-28: 4 mg via INTRAVENOUS

## 2015-01-28 MED ORDER — BUPIVACAINE-EPINEPHRINE (PF) 0.5% -1:200000 IJ SOLN
INTRAMUSCULAR | Status: AC
  Filled 2015-01-28: qty 30

## 2015-01-28 MED ORDER — FENTANYL CITRATE (PF) 100 MCG/2ML IJ SOLN
INTRAMUSCULAR | Status: DC | PRN
  Administered 2015-01-28: 100 ug via INTRAVENOUS

## 2015-01-28 MED ORDER — KETOROLAC TROMETHAMINE 30 MG/ML IJ SOLN
INTRAMUSCULAR | Status: DC | PRN
  Administered 2015-01-28: 30 mg via INTRAVENOUS

## 2015-01-28 MED ORDER — FENTANYL CITRATE (PF) 100 MCG/2ML IJ SOLN: 25.0000 ug | INTRAMUSCULAR | Status: DC | PRN

## 2015-01-28 MED ORDER — MORPHINE SULFATE (PF) 2 MG/ML IV SOLN
0.5000 mg | INTRAVENOUS | Status: DC | PRN
  Administered 2015-01-28: 0.5 mg via INTRAVENOUS
  Filled 2015-01-28: qty 1

## 2015-01-28 MED ORDER — FENTANYL CITRATE (PF) 250 MCG/5ML IJ SOLN
INTRAMUSCULAR | Status: AC
  Filled 2015-01-28: qty 5

## 2015-01-28 MED ORDER — SUGAMMADEX SODIUM 200 MG/2ML IV SOLN
INTRAVENOUS | Status: DC | PRN
  Administered 2015-01-28: 220 mg via INTRAVENOUS

## 2015-01-28 MED ORDER — ACETAMINOPHEN 650 MG RE SUPP: 650.0000 mg | Freq: Four times a day (QID) | RECTAL | Status: DC | PRN

## 2015-01-28 MED ORDER — METOCLOPRAMIDE HCL 5 MG/ML IJ SOLN: 5.0000 mg | Freq: Three times a day (TID) | INTRAMUSCULAR | Status: DC | PRN

## 2015-01-28 MED ORDER — BUPIVACAINE-EPINEPHRINE (PF) 0.5% -1:200000 IJ SOLN
INTRAMUSCULAR | Status: DC | PRN
  Administered 2015-01-28: 30 mL via PERINEURAL

## 2015-01-28 MED ORDER — ROCURONIUM BROMIDE 100 MG/10ML IV SOLN
INTRAVENOUS | Status: DC | PRN
  Administered 2015-01-28: 40 mg via INTRAVENOUS

## 2015-01-28 MED ORDER — ROCURONIUM BROMIDE 50 MG/5ML IV SOLN
INTRAVENOUS | Status: AC
  Filled 2015-01-28: qty 1

## 2015-01-28 MED ORDER — LIDOCAINE HCL (CARDIAC) 20 MG/ML IV SOLN
INTRAVENOUS | Status: DC | PRN
  Administered 2015-01-28: 70 mg via INTRAVENOUS

## 2015-01-28 MED ORDER — PROPOFOL 10 MG/ML IV BOLUS
INTRAVENOUS | Status: DC | PRN
  Administered 2015-01-28: 75 mg via INTRAVENOUS

## 2015-01-28 MED ORDER — SODIUM CHLORIDE 0.9 % IJ SOLN
INTRAMUSCULAR | Status: DC | PRN
  Administered 2015-01-28 (×3): 10 mL via INTRAVENOUS

## 2015-01-28 MED ORDER — MAGNESIUM SULFATE 2 GM/50ML IV SOLN
2.0000 g | Freq: Once | INTRAVENOUS | Status: AC
  Administered 2015-01-28: 2 g via INTRAVENOUS
  Filled 2015-01-28: qty 50

## 2015-01-28 MED ORDER — BUPIVACAINE HCL (PF) 0.5 % IJ SOLN
INTRAMUSCULAR | Status: AC
  Filled 2015-01-28: qty 30

## 2015-01-28 MED ORDER — SODIUM CHLORIDE 0.9 % IR SOLN
Status: DC | PRN
  Administered 2015-01-28: 1000 mL
  Administered 2015-01-28: 3000 mL

## 2015-01-28 MED ORDER — ONDANSETRON HCL 4 MG/2ML IJ SOLN: 4.0000 mg | Freq: Four times a day (QID) | INTRAMUSCULAR | Status: DC | PRN

## 2015-01-28 MED ORDER — ONDANSETRON HCL 4 MG/2ML IJ SOLN
INTRAMUSCULAR | Status: AC
  Filled 2015-01-28: qty 2

## 2015-01-28 MED ORDER — ASPIRIN EC 325 MG PO TBEC
325.0000 mg | DELAYED_RELEASE_TABLET | Freq: Two times a day (BID) | ORAL | Status: DC
  Administered 2015-01-29: 325 mg via ORAL
  Filled 2015-01-28: qty 1

## 2015-01-28 MED ORDER — PHENOL 1.4 % MT LIQD: 1.0000 | OROMUCOSAL | Status: DC | PRN

## 2015-01-28 SURGICAL SUPPLY — 53 items
ADH SKN CLS APL DERMABOND .7 (GAUZE/BANDAGES/DRESSINGS) ×2
BLADE SAW SGTL 18X1.27X75 (BLADE) ×2 IMPLANT
BLADE SURG ROTATE 9660 (MISCELLANEOUS) IMPLANT
CAPT HIP HEMI 2 ×1 IMPLANT
CHLORAPREP W/TINT 26ML (MISCELLANEOUS) ×2 IMPLANT
COVER SURGICAL LIGHT HANDLE (MISCELLANEOUS) ×2 IMPLANT
DERMABOND ADVANCED (GAUZE/BANDAGES/DRESSINGS) ×2
DERMABOND ADVANCED .7 DNX12 (GAUZE/BANDAGES/DRESSINGS) ×2 IMPLANT
DRAPE C-ARM 42X72 X-RAY (DRAPES) ×2 IMPLANT
DRAPE IMP U-DRAPE 54X76 (DRAPES) ×4 IMPLANT
DRAPE STERI IOBAN 125X83 (DRAPES) ×2 IMPLANT
DRAPE U-SHAPE 47X51 STRL (DRAPES) ×6 IMPLANT
DRSG AQUACEL AG ADV 3.5X10 (GAUZE/BANDAGES/DRESSINGS) ×2 IMPLANT
ELECT BLADE 4.0 EZ CLEAN MEGAD (MISCELLANEOUS) ×2
ELECT REM PT RETURN 9FT ADLT (ELECTROSURGICAL) ×2
ELECTRODE BLDE 4.0 EZ CLN MEGD (MISCELLANEOUS) ×1 IMPLANT
ELECTRODE REM PT RTRN 9FT ADLT (ELECTROSURGICAL) ×1 IMPLANT
EVACUATOR 1/8 PVC DRAIN (DRAIN) IMPLANT
GLOVE BIO SURGEON STRL SZ8.5 (GLOVE) ×4 IMPLANT
GLOVE BIOGEL PI IND STRL 8.5 (GLOVE) ×1 IMPLANT
GLOVE BIOGEL PI INDICATOR 8.5 (GLOVE) ×1
GOWN STRL REUS W/ TWL LRG LVL3 (GOWN DISPOSABLE) ×2 IMPLANT
GOWN STRL REUS W/TWL 2XL LVL3 (GOWN DISPOSABLE) ×2 IMPLANT
GOWN STRL REUS W/TWL LRG LVL3 (GOWN DISPOSABLE) ×4
HANDPIECE INTERPULSE COAX TIP (DISPOSABLE) ×2
HOOD PEEL AWAY FACE SHEILD DIS (HOOD) ×4 IMPLANT
KIT BASIN OR (CUSTOM PROCEDURE TRAY) ×2 IMPLANT
KIT ROOM TURNOVER OR (KITS) ×2 IMPLANT
MANIFOLD NEPTUNE II (INSTRUMENTS) ×2 IMPLANT
MARKER SKIN DUAL TIP RULER LAB (MISCELLANEOUS) ×2 IMPLANT
NDL SPNL 18GX3.5 QUINCKE PK (NEEDLE) ×1 IMPLANT
NEEDLE SPNL 18GX3.5 QUINCKE PK (NEEDLE) ×2 IMPLANT
NS IRRIG 1000ML POUR BTL (IV SOLUTION) ×2 IMPLANT
PACK TOTAL JOINT (CUSTOM PROCEDURE TRAY) ×2 IMPLANT
PACK UNIVERSAL I (CUSTOM PROCEDURE TRAY) ×2 IMPLANT
PAD ARMBOARD 7.5X6 YLW CONV (MISCELLANEOUS) ×4 IMPLANT
SAW OSC TIP CART 19.5X105X1.3 (SAW) ×1 IMPLANT
SEALER BIPOLAR AQUA 6.0 (INSTRUMENTS) IMPLANT
SET HNDPC FAN SPRY TIP SCT (DISPOSABLE) ×1 IMPLANT
SUCTION FRAZIER TIP 10 FR DISP (SUCTIONS) ×2 IMPLANT
SUT ETHIBOND NAB CT1 #1 30IN (SUTURE) ×4 IMPLANT
SUT MNCRL AB 3-0 PS2 18 (SUTURE) ×2 IMPLANT
SUT MON AB 2-0 CT1 36 (SUTURE) ×2 IMPLANT
SUT VIC AB 1 CT1 27 (SUTURE) ×2
SUT VIC AB 1 CT1 27XBRD ANBCTR (SUTURE) ×1 IMPLANT
SUT VIC AB 2-0 CT1 27 (SUTURE) ×2
SUT VIC AB 2-0 CT1 TAPERPNT 27 (SUTURE) ×1 IMPLANT
SUT VLOC 180 0 24IN GS25 (SUTURE) ×2 IMPLANT
SYR 50ML LL SCALE MARK (SYRINGE) ×2 IMPLANT
TOWEL OR 17X24 6PK STRL BLUE (TOWEL DISPOSABLE) ×2 IMPLANT
TOWEL OR 17X26 10 PK STRL BLUE (TOWEL DISPOSABLE) ×2 IMPLANT
TRAY FOLEY CATH 16FR SILVER (SET/KITS/TRAYS/PACK) IMPLANT
WATER STERILE IRR 1000ML POUR (IV SOLUTION) ×6 IMPLANT

## 2015-01-28 NOTE — Op Note (Signed)
OPERATIVE REPORT  SURGEON: Samson FredericBrian Teckla Christiansen, MD   ASSISTANT: Hart CarwinJustin Queen, RNFA.  PREOPERATIVE DIAGNOSIS: Displaced Left femoral neck fracture.   POSTOPERATIVE DIAGNOSIS: Displaced Left femoral neck fracture.   PROCEDURE: Left hip hemiarthroplasty, anterior approach.   IMPLANTS: DePuy Tri Lock stem, size 4, hi offset, with a -3 mm spacer and a 50 mm monopolar head ball.  ANESTHESIA:  General  ANTIBIOTICS: 2g ancef.  ESTIMATED BLOOD LOSS: 150 mL.  DRAINS: None.  COMPLICATIONS: None   CONDITION: PACU - hemodynamically stable.   BRIEF CLINICAL NOTE: Clement HusbandsShirley Zeitz is a 81 y.o. female with a displaced Left femoral neck fracture. The patient was admitted to the hospitalist service and underwent perioperative risk stratification and medical optimization. The risks, benefits, and alternatives to hemiarthroplasty were explained, and the patient elected to proceed.  PROCEDURE IN DETAIL: The patient was taken to the operating room and general anesthesia was induced on the hospital bed. The patient was then positioned on the Hana table. All bony prominences were well padded. The hip was prepped and draped in the normal sterile surgical fashion. A time-out was called verifying side and site of surgery. Antibiotics were given within 60 minutes of beginning the procedure.  The direct anterior approach to the hip was performed through the Hueter interval. Lateral femoral circumflex vessels were treated with the Auqumantys. The anterior capsule was exposed and an inverted T capsulotomy was made. Fracture hematoma was encountered and evacuated. The patient was found to have a comminuted Left subcapital femoral neck fracture. I freshened the femoral neck cut with a saw. I removed the femoral neck fragment. A corkscrew was placed into the head and the head was removed. This was passed to the back table and was measured.  Acetabular exposure was achieved. I examined the articular  cartilage which was intact. The labrum was intact. A 50 mm trial head was placed and found to have excellent fit.  I then gained femoral exposure taking care to protect the abductors and greater trochanter. This was performed using standard external rotation, extension, and adduction. The capsule was peeled off the inner aspect of the greater trochanter, taking care to preserve the short external rotators. A cookie cutter was used to enter the femoral canal, and then the femoral canal finder was used to confirm location. I then sequentially broached up to a size 4. Calcar planer was used on the femoral neck remnant. I placed a hi neck and a 36+ 0 head ball.The hip was reduced. Leg lengths were checked fluoroscopically. The hip was dislocated and trial components were removed. I placed the real stem followed by the real spacer and head ball. A single reduction maneuver was performed and the hip was reduced. Fluoroscopy was used to confirm component position and leg lengths. At 90 degrees of external rotation and extension, the hip was stable to an anterior directed force.  The wound was copiously irrigated with normal saline solution. Marcaine solution was injected into the periarticular soft tissue. The wound was closed in layers using #1 Vicryl and V-Loc for the fascia, 2-0 Vicryl for the subcutaneous fat, 2-0 Monocryl for the deep dermal layer, 3-0 running Monocryl subcuticular stitch and glue for the skin. Once the glue was fully dried, an Aquacell Ag dressing was applied. The patient was then awakened from anesthesia and transported to the recovery room in stable condition. Sponge, needle, and instrument counts were correct at the end of the case x2. The patient tolerated the procedure well and there were no known  complications.

## 2015-01-28 NOTE — H&P (View-Only) (Signed)
Reason for Consult:Broken left hip Referring Physician: EDP  [name] is an 81 y.o. female.  HPI: 81 yo female who fell several days ago who complains of increasing left hip and groin pain.  She denies any dizziness or shortness of breath that may have caused the fall.  " I just fell." Unable to ambulate now due to pain.  Past Medical History  Diagnosis Date  . Shortness of breath   . CAD (coronary artery disease)     Last catheterization 2009, grafts patent, EF 70%, 80% small OM with possible mild ischemic  . Hx of CABG     2000, LIMA to LAD, SVG to diagonal, SVG to OM, SVG to posterior descending  . Ejection fraction     EF 70%,, 2009 /  EF 65%, echo, October, 2011  . Hypertension   . Palpitations   . Dyslipidemia   . Diabetes mellitus   . Pancreatitis   . Carotid artery disease (HCC)     Mild, Doppler, 2000  . Dizziness     Evaluated April, 2008, no treatment needed  . Cervical spine disease     Severe neck pain, 2011  . Hyperkalemia     Her doctor Barnes, September, 2012, ACE inhibitor stopped, probable amlodipine to be  . Shortness of breath   . History of hysterectomy   . Chest pain     Chest hurting, May, 2013    Past Surgical History  Procedure Laterality Date  . Coronary artery bypass graft  2000  . Cardiac catheterization  2009  . Cholecystectomy    . Anterior cervical decomp/discectomy fusion  11/30/2011    Procedure: ANTERIOR CERVICAL DECOMPRESSION/DISCECTOMY FUSION 1 LEVEL;  Surgeon: David S Jones, MD;  Location: [facility];  Service: Neurosurgery;  Laterality: N/A;  Anterior Cervical Decompression Discectomy Cervical Five-Six  . Left heart catheterization with coronary/graft angiogram N/A 05/29/2013    Procedure: LEFT HEART CATHETERIZATION WITH CORONARY/GRAFT ANGIOGRAM;  Surgeon: Christopher D McAlhany, MD;  Location: [facility] CATH [facility];  Service: Cardiovascular;  Laterality: N/A;    Family History  Problem Relation Age of Onset  . Cancer Mother   .  Dementia Mother   . Heart failure Father   . Coronary artery disease Father   . Cancer      siblings  . Heart failure      siblings  . Dementia Brother   . Parkinsonism Brother     Social History:  reports that she has never smoked. She does not have any smokeless tobacco history on file. She reports that she does not drink alcohol or use illicit drugs.  Allergies:  Allergies  Allergen Reactions  . Codeine Hives    Medications: I have reviewed the patient's current medications.  Results for orders placed or performed during the hospital encounter of 01/26/15 (from the past 48 hour(s))  CBC with Differential     Status: None   Collection Time: 01/26/15  4:40 PM  Result Value Ref Range   WBC 6.8 4.0 - 10.5 K/uL   RBC 4.07 3.87 - 5.11 MIL/uL   Hemoglobin 12.1 12.0 - 15.0 g/dL   HCT 37.2 36.0 - 46.0 %   MCV 91.4 78.0 - 100.0 fL   MCH 29.7 26.0 - 34.0 pg   MCHC 32.5 30.0 - 36.0 g/dL   RDW 12.7 11.5 - 15.5 %   Platelets 285 150 - 400 K/uL   Neutrophils Relative % 60 %   Neutro Abs 4.1 1.7 - 7.7 K/uL     Lymphocytes Relative 29 %   Lymphs Abs 2.0 0.7 - 4.0 K/uL   Monocytes Relative 10 %   Monocytes Absolute 0.7 0.1 - 1.0 K/uL   Eosinophils Relative 1 %   Eosinophils Absolute 0.1 0.0 - 0.7 K/uL   Basophils Relative 0 %   Basophils Absolute 0.0 0.0 - 0.1 K/uL  Comprehensive metabolic panel     Status: Abnormal   Collection Time: 01/26/15  4:40 PM  Result Value Ref Range   Sodium 141 135 - 145 mmol/L   Potassium 2.5 (LL) 3.5 - 5.1 mmol/L    Comment: CRITICAL RESULT CALLED TO, READ BACK BY AND VERIFIED WITH: H HALL,RN 1722 01/26/2015 WBOND    Chloride 102 101 - 111 mmol/L   CO2 27 22 - 32 mmol/L   Glucose, Bld 170 (H) 65 - 99 mg/dL   BUN 10 6 - 20 mg/dL   Creatinine, Ser 1.26 (H) 0.44 - 1.00 mg/dL   Calcium 9.4 8.9 - 10.3 mg/dL   Total Protein 8.0 6.5 - 8.1 g/dL   Albumin 3.3 (L) 3.5 - 5.0 g/dL   AST 32 15 - 41 U/L   ALT 14 14 - 54 U/L   Alkaline Phosphatase 76 38 -  126 U/L   Total Bilirubin 0.5 0.3 - 1.2 mg/dL   GFR calc non Af Amer 38 (L) >60 mL/min   GFR calc Af Amer 44 (L) >60 mL/min    Comment: (NOTE) The eGFR has been calculated using the CKD EPI equation. This calculation has not been validated in all clinical situations. eGFR's persistently <60 mL/min signify possible Chronic Kidney Disease.    Anion gap 12 5 - 15  Protime-INR     Status: None   Collection Time: 01/26/15  4:40 PM  Result Value Ref Range   Prothrombin Time 13.9 11.6 - 15.2 seconds   INR 1.05 0.00 - 1.49    Dg Chest Port 1 View  01/26/2015  CLINICAL DATA:  Preop.  Left femoral neck fracture. EXAM: PORTABLE CHEST 1 VIEW COMPARISON:  07/03/2013 FINDINGS: Sequelae of prior CABG are again identified. The patient has taken a shallower inspiration than on the prior study and there is bronchovascular crowding with mild left basilar atelectasis. No pleural effusion or pneumothorax is identified. Surgical clips are noted in the region of the GE junction. IMPRESSION: Shallow inspiration with left basilar atelectasis. Electronically Signed   By: Allen  Grady M.D.   On: 01/26/2015 16:34   Dg Hip Unilat With Pelvis 2-3 Views Left  01/26/2015  CLINICAL DATA:  Fall at home one week ago with left hip pain. EXAM: DG HIP (WITH OR WITHOUT PELVIS) 2-3V LEFT COMPARISON:  None. FINDINGS: Examination demonstrates a minimally displaced subcapital to transcervical fracture of the left femoral neck could not exclude a nondisplaced fracture of the symphysis bilaterally. There are mild symmetric degenerative changes of the hips. There are degenerative changes of the spine. IMPRESSION: Minimally displaced left femoral neck fracture. Cannot exclude subtle fracture involving the symphysis bilaterally. Electronically Signed   By: Daniel  Boyle M.D.   On: 01/26/2015 15:27    ROS Blood pressure 116/62, pulse 69, temperature 98.3 F (36.8 C), resp. rate 23, SpO2 98 %. Physical Exam AAO in NAD on ED stretcher,  neck T and L spine non tender with no deformity or step off.  Bilateral UEs with normal ROM and 5/5 motor strength.  Right LE with normal AROM and no pain, NVI. Left LE painful with log roll and knee flexion (  hurts at the hip), leg not particularly shortened. NVI distally  Assessment/Plan: Displaced subcapital femoral neck fracture in 81 yo female on Plavix with heart history.  I have discussed this patient with Dr Brian Swinteck, ortho hip replacement specialist who has agreed to assume her care.  Surgery likely planned on Thursday due to Plavix which needs to be held, and due to surgical scheduling. Medical clearance and optimization pending.  She appears to be dehydrated and mildly malnourished.  Thank you !  Marlicia Sroka,STEVEN R 01/26/2015, 5:35 PM      

## 2015-01-28 NOTE — Anesthesia Procedure Notes (Signed)
Procedure Name: Intubation Date/Time: 01/28/2015 6:15 PM Performed by: Eligha Bridegroom Pre-anesthesia Checklist: Patient identified, Emergency Drugs available, Timeout performed, Patient being monitored and Suction available Patient Re-evaluated:Patient Re-evaluated prior to inductionOxygen Delivery Method: Circle system utilized Intubation Type: IV induction Ventilation: Mask ventilation without difficulty Grade View: Grade I Tube type: Oral Tube size: 7.0 mm Number of attempts: 1 Airway Equipment and Method: Stylet and LTA kit utilized Placement Confirmation: ETT inserted through vocal cords under direct vision and breath sounds checked- equal and bilateral Secured at: 21 cm Tube secured with: Tape Dental Injury: Teeth and Oropharynx as per pre-operative assessment

## 2015-01-28 NOTE — Progress Notes (Signed)
[place], NT from [facility] got pt's dentures (full set) and yellow colored ring with multicolored stones and gave to pt's family.

## 2015-01-28 NOTE — Anesthesia Preprocedure Evaluation (Addendum)
Anesthesia Evaluation  Patient identified by MRN, date of birth, ID band Patient awake    Reviewed: Allergy & Precautions, NPO status , Unable to perform ROS - Chart review only  Airway Mallampati: II  TM Distance: >3 FB Neck ROM: Full    Dental  (+) Edentulous Upper, Edentulous Lower   Pulmonary    breath sounds clear to auscultation       Cardiovascular hypertension,  Rhythm:Regular Rate:Normal + Systolic murmurs    Neuro/Psych    GI/Hepatic   Endo/Other  diabetes  Renal/GU      Musculoskeletal   Abdominal   Peds  Hematology   Anesthesia Other Findings   Reproductive/Obstetrics                            Anesthesia Physical Anesthesia Plan  ASA: III  Anesthesia Plan: General   Post-op Pain Management:    Induction: Intravenous  Airway Management Planned: Oral ETT  Additional Equipment:   Intra-op Plan:   Post-operative Plan: Extubation in OR  Informed Consent: I have reviewed the patients History and Physical, chart, labs and discussed the procedure including the risks, benefits and alternatives for the proposed anesthesia with the patient or authorized representative who has indicated his/her understanding and acceptance.   Dental advisory given  Plan Discussed with: CRNA and Anesthesiologist  Anesthesia Plan Comments: (L. Femoral Neck Fracture CAD S/P CABG 2009, cath 3/15 4/4 patent graft EF 55% Type 2 DM  CKD Stage 3  Mild dementia Hypertension Stopped plavix 2 days ago  Plan GA with oral ETT  Kipp Broodavid Demetra Moya  Plan GA with oral ETT)        Anesthesia Quick Evaluation

## 2015-01-28 NOTE — Interval H&P Note (Signed)
History and Physical Interval Note:  01/28/2015 5:13 PM  [name]  has presented today for surgery, with the diagnosis of Left Hip Fx  The various methods of treatment have been discussed with the patient and family. After consideration of risks, benefits and other options for treatment, the patient has consented to  Procedure(s): ARTHROPLASTY BIPOLAR HIP (HEMIARTHROPLASTY) (Left) as a surgical intervention .  The patient's history has been reviewed, patient examined, no change in status, stable for surgery.  I have reviewed the patient's chart and labs.  Questions were answered to the patient's satisfaction.     Abbagale Goguen, Cloyde ReamsBrian James

## 2015-01-28 NOTE — Anesthesia Postprocedure Evaluation (Signed)
Anesthesia Post Note  Patient: Sara HusbandsShirley Broadwell  Procedure(s) Performed: Procedure(s) (LRB): ARTHROPLASTY BIPOLAR HIP (HEMIARTHROPLASTY) (Left)  Anesthesia type: general  Patient location: PACU  Post pain: Pain level controlled  Post assessment: Patient's Cardiovascular Status Stable  Last Vitals:  Filed Vitals:   01/28/15 2037  BP:   Pulse: 78  Temp: 36.8 C  Resp: 23    Post vital signs: Reviewed and stable  Level of consciousness: sedated  Complications: No apparent anesthesia complications

## 2015-01-28 NOTE — Progress Notes (Signed)
Results for Clement HusbandsBARBEE, Jearlean (MRN [id number]) as of 01/28/2015 11:48  Ref. Range 01/27/2015 16:14 01/27/2015 20:04 01/28/2015 00:31 01/28/2015 04:16 01/28/2015 11:25  Glucose-Capillary Latest Ref Range: 65-99 mg/dL 295144 (H) 284183 (H) 132127 (H) 89 90  Noted that CBGs less than 100 mg/dl. Recommend changing CBGS and Novolog correction scale to TID and HS if eating. Will continue to monitor blood sugars while in the hospital. Smith MinceKendra Altamese Deguire RN BSN CDE

## 2015-01-28 NOTE — Discharge Instructions (Signed)
°Dr. Bartosz Luginbill °Joint Replacement Specialist °[facility] °[address] °[place], [place] [address] °[phone] ° ° °TOTAL HIP REPLACEMENT POSTOPERATIVE DIRECTIONS ° ° ° °Hip Rehabilitation, Guidelines Following Surgery  ° °WEIGHT BEARING °Weight bearing as tolerated with assist device (walker, cane, etc) as directed, use it as long as suggested by your surgeon or therapist, typically at least 4-6 weeks. ° °The results of a hip operation are greatly improved after range of motion and muscle strengthening exercises. Follow all safety measures which are given to protect your hip. If any of these exercises cause increased pain or swelling in your joint, decrease the amount until you are comfortable again. Then slowly increase the exercises. Call your caregiver if you have problems or questions.  ° °HOME CARE INSTRUCTIONS  °Most of the following instructions are designed to prevent the dislocation of your new hip.  °Remove items at home which could result in a fall. This includes throw rugs or furniture in walking pathways.  °Continue medications as instructed at time of discharge. °· You may have some home medications which will be placed on hold until you complete the course of blood thinner medication. °· You may start showering once you are discharged home. Do not remove your dressing. °Do not put on socks or shoes without following the instructions of your caregivers.   °Sit on chairs with arms. Use the chair arms to help push yourself up when arising.  °Arrange for the use of a toilet seat elevator so you are not sitting low.  °· Walk with walker as instructed.  °You may resume a sexual relationship in one month or when given the OK by your caregiver.  °Use walker as long as suggested by your caregivers.  °You may put full weight on your legs and walk as much as is comfortable. °Avoid periods of inactivity such as sitting longer than an hour when not asleep. This helps prevent  blood clots.  °You may return to work once you are cleared by your surgeon.  °Do not drive a car for 6 weeks or until released by your surgeon.  °Do not drive while taking narcotics.  °Wear elastic stockings for two weeks following surgery during the day but you may remove then at night.  °Make sure you keep all of your appointments after your operation with all of your doctors and caregivers. You should call the office at the above phone number and make an appointment for approximately two weeks after the date of your surgery. °Please pick up a stool softener and laxative for home use as long as you are requiring pain medications. °· ICE to the affected hip every three hours for 30 minutes at a time and then as needed for pain and swelling. Continue to use ice on the hip for pain and swelling from surgery. You may notice swelling that will progress down to the foot and ankle.  This is normal after surgery.  Elevate the leg when you are not up walking on it.   °It is important for you to complete the blood thinner medication as prescribed by your doctor. °· Continue to use the breathing machine which will help keep your temperature down.  It is common for your temperature to cycle up and down following surgery, especially at night when you are not up moving around and exerting yourself.  The breathing machine keeps your lungs expanded and your temperature down. ° °RANGE OF MOTION AND STRENGTHENING EXERCISES  °These exercises are   designed to help you keep full movement of your hip joint. Follow your caregiver's or physical therapist's instructions. Perform all exercises about fifteen times, three times per day or as directed. Exercise both hips, even if you have had only one joint replacement. These exercises can be done on a training (exercise) mat, on the floor, on a table or on a bed. Use whatever works the best and is most comfortable for you. Use music or television while you are exercising so that the exercises  are a pleasant break in your day. This will make your life better with the exercises acting as a break in routine you can look forward to.  °Lying on your back, slowly slide your foot toward your buttocks, raising your knee up off the floor. Then slowly slide your foot back down until your leg is straight again.  °Lying on your back spread your legs as far apart as you can without causing discomfort.  °Lying on your side, raise your upper leg and foot straight up from the floor as far as is comfortable. Slowly lower the leg and repeat.  °Lying on your back, tighten up the muscle in the front of your thigh (quadriceps muscles). You can do this by keeping your leg straight and trying to raise your heel off the floor. This helps strengthen the largest muscle supporting your knee.  °Lying on your back, tighten up the muscles of your buttocks both with the legs straight and with the knee bent at a comfortable angle while keeping your heel on the floor.  ° °SKILLED REHAB INSTRUCTIONS: °If the patient is transferred to a skilled rehab facility following release from the hospital, a list of the current medications will be sent to the facility for the patient to continue.  When discharged from the skilled rehab facility, please have the facility set up the patient's Home Health Physical Therapy prior to being released. Also, the skilled facility will be responsible for providing the patient with their medications at time of release from the facility to include their pain medication and their blood thinner medication. If the patient is still at the rehab facility at time of the two week follow up appointment, the skilled rehab facility will also need to assist the patient in arranging follow up appointment in our office and any transportation needs. ° °MAKE SURE YOU:  °Understand these instructions.  °Will watch your condition.  °Will get help right away if you are not doing well or get worse. ° °Pick up stool softner and  laxative for home use following surgery while on pain medications. °Do not remove your dressing. °The dressing is waterproof--it is OK to take showers. °Continue to use ice for pain and swelling after surgery. °Do not use any lotions or creams on the incision until instructed by your surgeon. °Total Hip Protocol. ° ° °

## 2015-01-28 NOTE — Progress Notes (Signed)
[facility] PROGRESS NOTE  [name] MVH:[id number] DOB: [birth year] DOA: 01/26/2015 PCP: Gaye Alken, MD  Assessment/Plan: Fracture of femoral neck, left: following a mechanical fall-patient never lost consciousness therefore doubt syncope. Ortho consulted for repair scheduled for today-does have a systolic murmur-however Echo with no major findings. EKG essentially unchanged from previous with non specific T-wave changes-(was reviewed with Dr Anne Fu). LHC on 05/29/13 showed all 4 grafts to be patent. Patient likely at least at moderate risk for the surgical procedure-this was discussed with patient and daughter, by phone, they understand the risks and agree to proceeding with surgery.Ok to proceed with surgery without any further work up  Aortic stenosis:Echo on 11/16 showed only mild stenosis. Will monitor closely  CAD -CABG 2000, Cath 2009,3/15 with patent grafts -Continue to hold Plavix and aspirin with anticipation of surgical fixation  Mild acute kidney injury: creatinine improved, ?pre-renal azotemia-but could have mild CKD due to DM.Repeat lytes in am  Hypokalemia:?secondary to lasix use.Will replete both Mg and K today,recheck in the AM  Hypertension -stable -continue on amlodipine  Diabetes mellitus type II -A1c 8.4-CBG's stable with SSI-continue to hold oral hypoglycemics. Follow and adjust accordingly.   Dementia without behavioral disturbance -stable-mental status at baseline-continue Namenda. At risk for delirium-follow closely-will need to minimize narcotics/sedatives.   Code Status:Full code Family Communication: Spoke with daughter [name], [phone], she understands the risks and agrees to the plan. Disposition Plan: Inpatient   Consultants:  Orthopedics  Procedures:  none  Antibiotics:  Ancef, start 11/17 surgical propylaxis    Objective: Filed Vitals:   01/28/15 0420  BP: 105/45  Pulse: 86  Temp: 98.7 F (37.1 C)   Resp: 18    Intake/Output Summary (Last 24 hours) at 01/28/15 0857 Last data filed at 01/28/15 0600  Gross per 24 hour  Intake   1270 ml  Output    350 ml  Net    920 ml   There were no vitals filed for this visit.  Exam:  General: In no acute distress, appears stated age.  Neck: Supple, no masses.  Head:normocephalic  Cardiovascular: Regular rate and rhythm, systolic murmur, no edema.  Respiratory: Normal effort, no wheezes, no rhonchi.  Abdomen: Soft , non-tender , non-distended, no masses.  Skin:Warm, dry, intact.  Psychiatric: normal affect and mood, awake , alert and oriented.   Data Reviewed: Basic Metabolic Panel:  Recent Labs Lab 01/26/15 1611 01/26/15 1640 01/26/15 2055 01/27/15 0732 01/28/15 0640  NA  --  141  --  141 137  K  --  2.5*  --  2.5* 3.4*  CL  --  102  --  106 106  CO2  --  27  --  23 22  GLUCOSE  --  170*  --  65 111*  BUN  --  10  --  10 15  CREATININE  --  1.26*  --  1.30* 1.19*  CALCIUM  --  9.4 8.6* 8.8* 8.6*  MG 1.4*  --   --  1.1* 1.6*   Liver Function Tests:  Recent Labs Lab 01/26/15 1640 01/26/15 2055  AST 32  --   ALT 14  --   ALKPHOS 76  --   BILITOT 0.5  --   PROT 8.0  --   ALBUMIN 3.3* 2.9*   No results for input(s): LIPASE, AMYLASE in the last 168 hours. No results for input(s): AMMONIA in the last 168 hours. CBC:  Recent Labs Lab 01/26/15 1640 01/27/15 0732 01/28/15 0640  WBC 6.8 8.9 9.3  NEUTROABS 4.1  --   --   HGB 12.1 11.8* 9.6*  HCT 37.2 36.4 30.3*  MCV 91.4 91.7 92.7  PLT 285 245 237   Cardiac Enzymes: No results for input(s): CKTOTAL, CKMB, CKMBINDEX, TROPONINI in the last 168 hours. BNP (last 3 results) No results for input(s): BNP in the last 8760 hours.  ProBNP (last 3 results) No results for input(s): PROBNP in the last 8760 hours.  CBG:  Recent Labs Lab 01/27/15 1143 01/27/15 1614 01/27/15 2004 01/28/15 0031 01/28/15 0416  GLUCAP 221* 144* 183* 127* 89    No results  found for this or any previous visit (from the past 240 hour(s)).   Studies: Dg Knee 1-2 Views Left  24-Feb-2015  CLINICAL DATA:  Acute onset of left hip pain after fall. Initial encounter. EXAM: LEFT KNEE - 1-2 VIEW COMPARISON:  None. FINDINGS: No acute fracture is seen at the knee. The distal femur appears intact. A small knee joint effusion is noted. There is suggestion of mild lateral compartment narrowing. No definite soft tissue abnormalities are otherwise characterized. IMPRESSION: 1. No evidence of fracture or dislocation. 2. Small knee joint effusion noted. 3. Suggestion of mild lateral compartment narrowing. Electronically Signed   By: Roanna Raider M.D.   On: 02/24/2015 18:52   Ct Pelvis Wo Contrast  2015/02/24  CLINICAL DATA:  Left hip and pelvic pain, after collapsing when standing up out of bed. Question for pubic symphysis fracture on radiograph. Further evaluation requested. Initial encounter. EXAM: CT PELVIS WITHOUT CONTRAST TECHNIQUE: Multidetector CT imaging of the pelvis was performed following the standard protocol without intravenous contrast. COMPARISON:  Left hip radiographs performed earlier today at 2:59 p.m. FINDINGS: There is a minimally displaced subcapital fracture through the left femoral neck, with slight comminution. No additional fracture is seen. The pubic symphysis demonstrates mild degenerative change but is otherwise unremarkable in appearance. The right hip joint appears intact. Degenerative change is noted at the lower lumbar spine, with vacuum phenomenon at L4-L5 and L5-S1. The sacroiliac joints are grossly unremarkable, aside from mild sclerotic change at the left sacroiliac joint. Scattered vascular calcifications are seen. Diffuse diverticulosis is noted along the distal descending and sigmoid colon. A clip is noted at the right inguinal region. No definite significant soft tissue injury is characterized. No focal soft tissue hematoma is seen. IMPRESSION: 1.  Minimally displaced subcapital fracture through the left femoral neck, with slight comminution. No additional fracture seen. 2. Degenerative change at the lower lumbar spine. 3. Scattered vascular calcifications seen. 4. Diffuse diverticulosis along the distal descending and sigmoid colon. Electronically Signed   By: Roanna Raider M.D.   On: 02/24/15 18:19   Dg Chest Port 1 View  02-24-2015  CLINICAL DATA:  Preop.  Left femoral neck fracture. EXAM: PORTABLE CHEST 1 VIEW COMPARISON:  07/03/2013 FINDINGS: Sequelae of prior CABG are again identified. The patient has taken a shallower inspiration than on the prior study and there is bronchovascular crowding with mild left basilar atelectasis. No pleural effusion or pneumothorax is identified. Surgical clips are noted in the region of the GE junction. IMPRESSION: Shallow inspiration with left basilar atelectasis. Electronically Signed   By: Sebastian Ache M.D.   On: 02/24/2015 16:34   Dg Hip Unilat With Pelvis 2-3 Views Left  02-24-15  CLINICAL DATA:  Fall at home one week ago with left hip pain. EXAM: DG HIP (WITH OR WITHOUT PELVIS) 2-3V LEFT COMPARISON:  None. FINDINGS: Examination demonstrates a  minimally displaced subcapital to transcervical fracture of the left femoral neck could not exclude a nondisplaced fracture of the symphysis bilaterally. There are mild symmetric degenerative changes of the hips. There are degenerative changes of the spine. IMPRESSION: Minimally displaced left femoral neck fracture. Cannot exclude subtle fracture involving the symphysis bilaterally. Electronically Signed   By: Elberta Fortisaniel  Boyle M.D.   On: 01/26/2015 15:27    Scheduled Meds: . amLODipine  10 mg Oral Daily  . aspirin EC  81 mg Oral Daily  .  ceFAZolin (ANCEF) IV  1 g Intravenous 3 times per day  . DULoxetine  60 mg Oral Daily  . feeding supplement (ENSURE ENLIVE)  237 mL Oral TID BM  . gabapentin  200 mg Oral BID  . insulin aspart  0-15 Units Subcutaneous 6  times per day  . LORazepam  0.5 mg Oral BID  . magnesium sulfate 1 - 4 g bolus IVPB  2 g Intravenous Once  . memantine  10 mg Oral BID  . polyethylene glycol  17 g Oral Daily  . potassium chloride  10 mEq Intravenous Q1 Hr x 2  . potassium chloride  40 mEq Oral Daily  . tranexamic acid  (ORTHO-IV)  1,000 mg Intravenous To OR   Continuous Infusions: . sodium chloride 50 mL/hr at 01/28/15 [id number]    Principal Problem:   Fracture of femoral neck, left (HCC) Active Problems:   CAD- CABG '00. Cath in '09 and 05/29/13- patent grafts   Hypertension   Dyslipidemia   Diabetes mellitus type II, uncontrolled (HCC)   Aortic stenosis   Dementia without behavioral disturbance   Fall at home   Left displaced femoral neck fracture (HCC)  Time spent: 25 minutes   Taejah Ohalloran PA-S [facility] If 7PM-7AM, please contact night-coverage at [email or web address], password [facility] 01/28/2015, 8:57 AM  LOS: 2 days

## 2015-01-28 NOTE — Transfer of Care (Signed)
2Immediate Anesthesia Transfer of Care Note  Patient: Sara HusbandsShirley [name]  Procedure(s) Performed: Procedure(s): ARTHROPLASTY BIPOLAR HIP (HEMIARTHROPLASTY) (Left)  Patient Location: PACU  Anesthesia Type:General  Level of Consciousness: awake and alert   Airway & Oxygen Therapy: Patient Spontanous Breathing and Patient connected to nasal cannula oxygen  Post-op Assessment: Report given to RN and Post -op Vital signs reviewed and stable  Post vital signs: Reviewed and stable  Last Vitals:  Filed Vitals:   01/28/15 1700  BP: 121/52  Pulse: 79  Temp: 37.2 C  Resp: 18    Complications: No apparent anesthesia complications

## 2015-01-29 ENCOUNTER — Encounter (HOSPITAL_COMMUNITY): Payer: Self-pay | Admitting: Orthopedic Surgery

## 2015-01-29 LAB — GLUCOSE, CAPILLARY
GLUCOSE-CAPILLARY: 125 mg/dL — AB (ref 65–99)
GLUCOSE-CAPILLARY: 269 mg/dL — AB (ref 65–99)
GLUCOSE-CAPILLARY: 271 mg/dL — AB (ref 65–99)
Glucose-Capillary: 93 mg/dL (ref 65–99)
Glucose-Capillary: 95 mg/dL (ref 65–99)
Glucose-Capillary: 231 mg/dL — ABNORMAL HIGH (ref 65–99)

## 2015-01-29 LAB — BASIC METABOLIC PANEL
Anion gap: 7 (ref 5–15)
BUN: 15 mg/dL (ref 6–20)
CO2: 23 mmol/L (ref 22–32)
CREATININE: 1.21 mg/dL — AB (ref 0.44–1.00)
Calcium: 8.2 mg/dL — ABNORMAL LOW (ref 8.9–10.3)
Chloride: 107 mmol/L (ref 101–111)
GFR calc Af Amer: 46 mL/min — ABNORMAL LOW (ref >60)
GFR, EST NON AFRICAN AMERICAN: 40 mL/min — AB (ref >60)
Glucose, Bld: 95 mg/dL (ref 65–99)
Potassium: 4.5 mmol/L (ref 3.5–5.1)
SODIUM: 137 mmol/L (ref 135–145)

## 2015-01-29 LAB — CBC
HCT: 27.8 % — ABNORMAL LOW (ref 36.0–46.0)
Hemoglobin: 8.7 g/dL — ABNORMAL LOW (ref 12.0–15.0)
MCH: 29.2 pg (ref 26.0–34.0)
MCHC: 31.3 g/dL (ref 30.0–36.0)
MCV: 93.3 fL (ref 78.0–100.0)
PLATELETS: 235 10*3/uL (ref 150–400)
RBC: 2.98 MIL/uL — ABNORMAL LOW (ref 3.87–5.11)
RDW: 12.9 % (ref 11.5–15.5)
WBC: 8 10*3/uL (ref 4.0–10.5)

## 2015-01-29 MED ORDER — ENOXAPARIN SODIUM 40 MG/0.4ML ~~LOC~~ SOLN: 40.0000 mg | SUBCUTANEOUS | Status: DC

## 2015-01-29 MED ORDER — ASPIRIN EC 81 MG PO TBEC
81.0000 mg | DELAYED_RELEASE_TABLET | Freq: Every day | ORAL | Status: DC
Start: 2015-01-30 — End: 2015-01-29

## 2015-01-29 MED ORDER — ASPIRIN EC 325 MG PO TBEC: 325.0000 mg | DELAYED_RELEASE_TABLET | Freq: Every day | ORAL | Status: DC

## 2015-01-29 MED ORDER — ALBUTEROL SULFATE (2.5 MG/3ML) 0.083% IN NEBU: 2.5000 mg | INHALATION_SOLUTION | RESPIRATORY_TRACT | Status: DC | PRN

## 2015-01-29 MED ORDER — SODIUM CHLORIDE 0.9 % IV BOLUS (SEPSIS)
250.0000 mL | Freq: Once | INTRAVENOUS | Status: AC
  Administered 2015-01-29: 250 mL via INTRAVENOUS

## 2015-01-29 MED ORDER — HYDROCODONE-ACETAMINOPHEN 5-325 MG PO TABS: 1.0000 | ORAL_TABLET | ORAL | Status: DC | PRN

## 2015-01-29 MED ORDER — SENNOSIDES-DOCUSATE SODIUM 8.6-50 MG PO TABS
1.0000 | ORAL_TABLET | Freq: Every day | ORAL | Status: DC
  Administered 2015-01-29: 1 via ORAL
  Filled 2015-01-29: qty 1

## 2015-01-29 MED ORDER — CLOPIDOGREL BISULFATE 75 MG PO TABS
75.0000 mg | ORAL_TABLET | Freq: Every day | ORAL | Status: DC
  Administered 2015-01-29 – 2015-01-30 (×2): 75 mg via ORAL
  Filled 2015-01-29 (×2): qty 1

## 2015-01-29 MED ORDER — ASPIRIN EC 325 MG PO TBEC
325.0000 mg | DELAYED_RELEASE_TABLET | Freq: Every day | ORAL | Status: DC
  Administered 2015-01-30: 325 mg via ORAL
  Filled 2015-01-29: qty 1

## 2015-01-29 MED ORDER — OXYCODONE HCL 5 MG PO TABS
5.0000 mg | ORAL_TABLET | ORAL | Status: DC | PRN
  Administered 2015-01-29 – 2015-01-30 (×5): 5 mg via ORAL
  Filled 2015-01-29 (×5): qty 1

## 2015-01-29 NOTE — Progress Notes (Signed)
PATIENT DETAILS Name: [name] Age: 81 y.o. Sex: female Date of Birth: 1928-11-29 Admit Date: 01/26/2015 Admitting Physician Ozella Rocks, MD WUJ:[id number] Roseanne Reno, MD  Subjective: Mildly confused.  Assessment/Plan: Fracture of femoral neck, left: following a mechanical fall-patient never lost consciousness therefore doubt syncope. Ortho consulted-underwent left hip hemiarthroplasty on 11/17.   Anemia: Suspect anemia secondary to perioperative blood loss-follow and transfuse if significant drop.  History of Aortic stenosis:Echo on 11/16 showed only mild stenosis. Will need periodic outpatient monitoring.  CAD: Aspirin/Plavix initially had an anticipation of surgical repair-restarted on 11/18. Note Cath 2009, with patent grafts  Mild acute kidney injury: creatinine improved, ?pre-renal azotemia-but could have mild CKD due to DM.Repeat lytes in am  Hypokalemia:?secondary to lasix use.Will replete both Mg and K today,recheck in the AM  Hypertension:stable,continue on amlodipine  Diabetes mellitus type II:A1c 8.4-CBG's stable with SSI-continue to hold oral hypoglycemics. Follow and adjust accordingly.   Dementia without behavioral disturbance:stable-mild confusion today-is at risk for further delirium-minimize narcotics and sedatives as much as possible. Continue Namenda.   Disposition: Remain inpatient-SNF on 11/19 if stable  Antimicrobial agents  See below  Anti-infectives    Start     Dose/Rate Route Frequency Ordered Stop   01/29/15 0000  ceFAZolin (ANCEF) IVPB 2 g/50 mL premix     2 g 100 mL/hr over 30 Minutes Intravenous Every 6 hours 01/28/15 2049 01/29/15 0508   01/28/15 1200  [MAR Hold]  ceFAZolin (ANCEF) IVPB 1 g/50 mL premix  Status:  Discontinued     (MAR Hold since 01/28/15 1712)   1 g 100 mL/hr over 30 Minutes Intravenous 3 times per day 01/27/15 0740 01/28/15 2049      DVT Prophylaxis: Prophylactic Lovenox   Code  Status: Full code  Family Communication None at bedside  Procedures: Left Hip hemiarthroplasty  CONSULTS:  orthopedic surgery  Time spent 30 minutes-Greater than 50% of this time was spent in counseling, explanation of diagnosis, planning of further management, and coordination of care.  MEDICATIONS: Scheduled Meds: . amLODipine  10 mg Oral Daily  . aspirin EC  325 mg Oral BID PC  . DULoxetine  60 mg Oral Daily  . feeding supplement (ENSURE ENLIVE)  237 mL Oral TID BM  . gabapentin  200 mg Oral BID  . insulin aspart  0-15 Units Subcutaneous 6 times per day  . LORazepam  0.5 mg Oral BID  . memantine  10 mg Oral BID  . polyethylene glycol  17 g Oral Daily  . potassium chloride  40 mEq Oral Daily  . senna-docusate  1 tablet Oral QHS   Continuous Infusions:  PRN Meds:.acetaminophen **OR** acetaminophen, albuterol, menthol-cetylpyridinium **OR** phenol, metoCLOPramide **OR** metoCLOPramide (REGLAN) injection, morphine injection, ondansetron **OR** ondansetron (ZOFRAN) IV, oxyCODONE   PHYSICAL EXAM: Vital signs in last 24 hours: Filed Vitals:   01/29/15 0603 01/29/15 0730 01/29/15 0939 01/29/15 1044  BP: 115/53  111/58 108/45  Pulse: 75  70 66  Temp: 98.6 F (37 C)     TempSrc: Oral     Resp: SpO2: 98% 97% 94% 94%    Weight change:  There were no vitals filed for this visit. There is no weight on file to calculate BMI.   Gen Exam: Awake and alert with clear speech.  Neck: Supple, No JVD.   Chest: B/L Clear.   CVS: S1 S2 Regular, no murmurs.  Abdomen:  soft, BS +, non tender, non distended.  Extremities: no edema, lower extremities warm to touch. Neurologic: Non Focal.   Skin: No Rash.   Wounds: N/A.    Intake/Output from previous day:  Intake/Output Summary (Last 24 hours) at 01/29/15 1327 Last data filed at 01/29/15 1300  Gross per 24 hour  Intake   1480 ml  Output    550 ml  Net    930 ml     LAB RESULTS: CBC  Recent Labs Lab  01/26/15 1640 01/27/15 0732 01/28/15 0640 01/29/15 0412  WBC 6.8 8.9 9.3 8.0  HGB 12.1 11.8* 9.6* 8.7*  HCT 37.2 36.4 30.3* 27.8*  PLT 285 245 237 235  MCV 91.4 91.7 92.7 93.3  MCH 29.7 29.7 29.4 29.2  MCHC 32.5 32.4 31.7 31.3  RDW 12.7 13.0 12.9 12.9  LYMPHSABS 2.0  --   --   --   MONOABS 0.7  --   --   --   EOSABS 0.1  --   --   --   BASOSABS 0.0  --   --   --     Chemistries   Recent Labs Lab 01/26/15 1611 01/26/15 1640 01/26/15 2055 01/27/15 0732 01/28/15 0640 01/29/15 0412  NA  --  141  --  141 137 137  K  --  2.5*  --  2.5* 3.4* 4.5  CL  --  102  --  106 106 107  CO2  --  27  --  23 22 23   GLUCOSE  --  170*  --  65 111* 95  BUN  --  10  --  10 15 15   CREATININE  --  1.26*  --  1.30* 1.19* 1.21*  CALCIUM  --  9.4 8.6* 8.8* 8.6* 8.2*  MG 1.4*  --   --  1.1* 1.6*  --     CBG:  Recent Labs Lab 01/28/15 2001 01/29/15 0017 01/29/15 0441 01/29/15 0751 01/29/15 1154  GLUCAP 87 125* 93 95 231*    GFR CrCl cannot be calculated (Unknown ideal weight.).  Coagulation profile  Recent Labs Lab 01/26/15 1640  INR 1.05    Cardiac Enzymes No results for input(s): CKMB, TROPONINI, MYOGLOBIN in the last 168 hours.  Invalid input(s): CK  Invalid input(s): POCBNP No results for input(s): DDIMER in the last 72 hours.  Recent Labs  01/26/15 1641  HGBA1C 8.4*   No results for input(s): CHOL, HDL, LDLCALC, TRIG, CHOLHDL, LDLDIRECT in the last 72 hours. No results for input(s): TSH, T4TOTAL, T3FREE, THYROIDAB in the last 72 hours.  Invalid input(s): FREET3 No results for input(s): VITAMINB12, FOLATE, FERRITIN, TIBC, IRON, RETICCTPCT in the last 72 hours. No results for input(s): LIPASE, AMYLASE in the last 72 hours.  Urine Studies No results for input(s): UHGB, CRYS in the last 72 hours.  Invalid input(s): UACOL, UAPR, USPG, UPH, UTP, UGL, UKET, UBIL, UNIT, UROB, ULEU, UEPI, UWBC, URBC, UBAC, CAST, UCOM, BILUA  MICROBIOLOGY: No results found for  this or any previous visit (from the past 240 hour(s)).  RADIOLOGY STUDIES/RESULTS: Dg Knee 1-2 Views Left  01/26/2015  CLINICAL DATA:  Acute onset of left hip pain after fall. Initial encounter. EXAM: LEFT KNEE - 1-2 VIEW COMPARISON:  None. FINDINGS: No acute fracture is seen at the knee. The distal femur appears intact. A small knee joint effusion is noted. There is suggestion of mild lateral compartment narrowing. No definite soft tissue abnormalities are otherwise characterized. IMPRESSION: 1. No evidence of fracture  or dislocation. 2. Small knee joint effusion noted. 3. Suggestion of mild lateral compartment narrowing. Electronically Signed   By: Roanna Raider M.D.   On: 01/26/2015 18:52   Ct Pelvis Wo Contrast  01/26/2015  CLINICAL DATA:  Left hip and pelvic pain, after collapsing when standing up out of bed. Question for pubic symphysis fracture on radiograph. Further evaluation requested. Initial encounter. EXAM: CT PELVIS WITHOUT CONTRAST TECHNIQUE: Multidetector CT imaging of the pelvis was performed following the standard protocol without intravenous contrast. COMPARISON:  Left hip radiographs performed earlier today at 2:59 p.m. FINDINGS: There is a minimally displaced subcapital fracture through the left femoral neck, with slight comminution. No additional fracture is seen. The pubic symphysis demonstrates mild degenerative change but is otherwise unremarkable in appearance. The right hip joint appears intact. Degenerative change is noted at the lower lumbar spine, with vacuum phenomenon at L4-L5 and L5-S1. The sacroiliac joints are grossly unremarkable, aside from mild sclerotic change at the left sacroiliac joint. Scattered vascular calcifications are seen. Diffuse diverticulosis is noted along the distal descending and sigmoid colon. A clip is noted at the right inguinal region. No definite significant soft tissue injury is characterized. No focal soft tissue hematoma is seen. IMPRESSION:  1. Minimally displaced subcapital fracture through the left femoral neck, with slight comminution. No additional fracture seen. 2. Degenerative change at the lower lumbar spine. 3. Scattered vascular calcifications seen. 4. Diffuse diverticulosis along the distal descending and sigmoid colon. Electronically Signed   By: Roanna Raider M.D.   On: 01/26/2015 18:19   Pelvis Portable  01/28/2015  CLINICAL DATA:  S/p left total hip EXAM: PORTABLE PELVIS 1-2 VIEWS COMPARISON:  CT 01/26/2015 FINDINGS: Changes of left hip arthroplasty. No fracture. No dislocation. Regional subcutaneous gas. IMPRESSION: 1. Interval left hip arthroplasty without   apparent complication. Electronically Signed   By: Corlis Leak M.D.   On: 01/28/2015 20:29   Dg Chest Port 1 View  01/26/2015  CLINICAL DATA:  Preop.  Left femoral neck fracture. EXAM: PORTABLE CHEST 1 VIEW COMPARISON:  07/03/2013 FINDINGS: Sequelae of prior CABG are again identified. The patient has taken a shallower inspiration than on the prior study and there is bronchovascular crowding with mild left basilar atelectasis. No pleural effusion or pneumothorax is identified. Surgical clips are noted in the region of the GE junction. IMPRESSION: Shallow inspiration with left basilar atelectasis. Electronically Signed   By: Sebastian Ache M.D.   On: 01/26/2015 16:34   Dg Hip Operative Unilat With Pelvis Left  01/28/2015  CLINICAL DATA:  Left anterior hip replacement EXAM: OPERATIVE LEFT HIP (WITH PELVIS IF PERFORMED) 3 VIEWS TECHNIQUE: Fluoroscopic spot image(s) were submitted for interpretation post-operatively. FLUOROSCOPY TIME:  11 seconds COMPARISON:  Left hip radiographs dated 01/26/2015 FINDINGS: Intraoperative fluoroscopic spot images during left hip arthroplasty. IMPRESSION: Intraoperative fluoroscopic radiographs, as above. Electronically Signed   By: Charline Bills M.D.   On: 01/28/2015 19:26   Dg Hip Unilat With Pelvis 2-3 Views Left  01/26/2015  CLINICAL  DATA:  Fall at home one week ago with left hip pain. EXAM: DG HIP (WITH OR WITHOUT PELVIS) 2-3V LEFT COMPARISON:  None. FINDINGS: Examination demonstrates a minimally displaced subcapital to transcervical fracture of the left femoral neck could not exclude a nondisplaced fracture of the symphysis bilaterally. There are mild symmetric degenerative changes of the hips. There are degenerative changes of the spine. IMPRESSION: Minimally displaced left femoral neck fracture. Cannot exclude subtle fracture involving the symphysis bilaterally. Electronically Signed  By: Elberta Fortis M.D.   On: 01/26/2015 15:27    Jeoffrey Massed, MD  [facility] Pager:[phone]  If 7PM-7AM, please contact night-coverage [email or web address] Password [email or web address] 01/29/2015, 1:27 PM   LOS: 3 days

## 2015-01-29 NOTE — Clinical Social Work Note (Signed)
Clinical Social Work Assessment  Patient Details  Name: [name] MRN: [id number] Date of Birth: 12/29/1928  Date of referral:  01/29/15               Reason for consult:  Facility Placement                Permission sought to share information with:  Family Supports Permission granted to share information::  Yes, Verbal Permission Granted  Name::     [name]  Agency::     Relationship::  Daughter  Contact Information:  [phone] (mobile)  Housing/Transportation Living arrangements for the past 2 months:  Single Family Home Source of Information:  Patient, Adult Children (Daughter [name], at the bedside) Patient Interpreter Needed:  None Criminal Activity/Legal Involvement Pertinent to Current Situation/Hospitalization:  No - Comment as needed Significant Relationships:  Adult Children Lives with:  Self Do you feel safe going back to the place where you live?  Yes (Patient feels safe at home but understands that she needs rehab prior to returning home.) Need for family participation in patient care:  Yes (Comment)  Care giving concerns:  Patient lives alone and would have no with her 24/7 at discharge.  Social Worker assessment / plan:  CSW talked with patient and daughter [name] [phone]) at the bedside regarding discharge plans and recommendation of ST rehab. Patient has never been to rehab in the community and per daughter, she has been to inpatient rehab a few years ago.  The facility search process explained and daughter provided with [place]. [facility] and [facility].  Employment status:  Retired Health and safety inspectornsurance information:  [place], [employer] PT Recommendations:  Skilled Nursing Facility Information / Referral to community resources:  [facility]illed [facility] ([place] [facility] [place]  Patient/Family's Response to care:  No concerns  expressed.  Patient/Family's Understanding of and Emotional Response to Diagnosis, Current Treatment, and Prognosis:  Not discussed.  Emotional Assessment Appearance:  Appears stated age Attitude/Demeanor/Rapport:  Other (Appropriate) Affect (typically observed):  Appropriate, Pleasant Orientation:  Oriented to Self, Oriented to Place, Oriented to  Time, Oriented to Situation Alcohol / Substance use:  Never Used Psych involvement (Current and /or in the community):  No (Comment)  Discharge Needs  Concerns to be addressed:  Discharge Planning Concerns Readmission within the last 30 days:  No Current discharge risk:  None Barriers to Discharge:  No Barriers Identified   Cristobal GoldmannCrawford, Nissim Fleischer Bradley, LCSW 01/29/2015, 8:03 PM

## 2015-01-29 NOTE — Clinical Social Work Placement (Addendum)
   CLINICAL SOCIAL WORK PLACEMENT  NOTE  Date:  01/29/2015  Patient Details  Name: Clement HusbandsShirley Bier MRN: [id number] Date of Birth: 12/27/1928  Clinical Social Work is seeking post-discharge placement for this patient at the Skilled  Nursing Facility level of care (*CSW will initial, date and re-position this form in  chart as items are completed):  Yes   Patient/family provided with [place] Clinical Social Work Department's list of facilities offering this level of care within the geographic area requested by the patient (or if unable, by the patient's family).  Yes   Patient/family informed of their freedom to choose among providers that offer the needed level of care, that participate in Medicare, Medicaid or managed care program needed by the patient, have an available bed and are willing to accept the patient.  Yes   Patient/family informed of [facility]'s ownership interest in [facility] and [facility], as well as of the fact that they are under no obligation to receive care at these facilities.  PASRR submitted to EDS on       PASRR number received on       Existing PASRR number confirmed on 01/29/15     FL2 transmitted to all facilities in geographic area requested by pt/family on 01/29/15     FL2 transmitted to all facilities within larger geographic area on       Patient informed that his/her managed care company has contracts with or will negotiate with certain facilities, including the following:            Patient/family informed of bed offers received.  Patient chooses bed at       Physician recommends and patient chooses bed at      Patient to be transferred to   on  .  Patient to be transferred to facility by       Patient family notified on   of transfer.  Name of family member notified:        PHYSICIAN      Additional Comment:  01/29/15 - Daughter's preferences [facility] and [facility]. [facility] has no available beds  and dall made to [facility] but did not receive a return call.   _______________________________________________ Cristobal Goldmannrawford, Juleah Paradise Bradley, LCSW 01/29/2015, 8:07 PM

## 2015-01-29 NOTE — Progress Notes (Addendum)
   Subjective:  Patient reports pain as mild to moderate.  No c/o.  Objective:   VITALS:   Filed Vitals:   01/28/15 2033 01/28/15 2037 01/28/15 2100 01/29/15 0603  BP: 110/51  124/68 115/53  Pulse: 78 78 94 75  Temp:  98.2 F (36.8 C) 99.3 F (37.4 C) 98.6 F (37 C)  TempSrc:   Oral Oral  Resp: 18 23 18 18   SpO2: 98% 94% 94% 98%    ABD soft Sensation intact distally Intact pulses distally Dorsiflexion/Plantar flexion intact Incision: dressing C/D/I Compartment soft   Lab Results  Component Value Date   WBC 8.0 01/29/2015   HGB 8.7* 01/29/2015   HCT 27.8* 01/29/2015   MCV 93.3 01/29/2015   PLT 235 01/29/2015   BMET    Component Value Date/Time   NA 137 01/29/2015 0412   K 4.5 01/29/2015 0412   CL 107 01/29/2015 0412   CO2 23 01/29/2015 0412   GLUCOSE 95 01/29/2015 0412   BUN 15 01/29/2015 0412   CREATININE 1.21* 01/29/2015 0412   CALCIUM 8.2* 01/29/2015 0412   GFRNONAA 40* 01/29/2015 0412   GFRAA 46* 01/29/2015 0412     Assessment/Plan: 1 Day Post-Op   Principal Problem:   Fracture of femoral neck, left (HCC) Active Problems:   CAD- CABG '00. Cath in '09 and 05/29/13- patent grafts   Hypertension   Dyslipidemia   Diabetes mellitus type II, uncontrolled (HCC)   Aortic stenosis   Dementia without behavioral disturbance   Fall at home   Left displaced femoral neck fracture (HCC)   Displaced fracture of left femoral neck (HCC)   WBAT with walker DVT ppx: restart plavix, add ASA 325 mg PO daily, SCDs, TEDs PO pain control PT/OT D/C planning   Egbert Seidel, Cloyde ReamsBrian James 01/29/2015, 7:47 AM   Samson FredericBrian Shunte Senseney, MD Cell [phone]

## 2015-01-29 NOTE — Care Management Important Message (Signed)
Important Message  Patient Details  Name: [name] MRN: [id number] Date of Birth: 06/06/1928   Medicare Important Message Given:  Yes    Aimee Timmons P Skylynne Schlechter 01/29/2015, 2:44 PM

## 2015-01-29 NOTE — Evaluation (Signed)
Physical Therapy Evaluation Patient Details Name: Sara HusbandsShirley [name] MRN: [id number] DOB: 05/09/1928 Today's Date: 01/29/2015   History of Present Illness  Pt admitted after fall with Left femur fx s/p hemiarthroplasty with anterior approach. PMHx: CABG, CAD, dizziness, cervical disease  Clinical Impression  Pt presents with global LE weakness, L LE ROM, and general balance deficits. Pt would benefit from skilled PT services to improve LE strength, painfree ROM, and stability in order to increase her functional mobility. Pt wishes to return home but the evaluation revealed that she is currently unsafe to return home without 24-hr support therefore, we believe rehabilitation through SNF upon D/C.     Follow Up Recommendations SNF    Equipment Recommendations       Recommendations for Other Services OT consult     Precautions / Restrictions Precautions Precautions: Fall Restrictions Weight Bearing Restrictions: Yes LLE Weight Bearing: Weight bearing as tolerated      Mobility  Bed Mobility Overal bed mobility: Needs Assistance Bed Mobility: Supine to Sit     Supine to sit: Supervision;HOB elevated     General bed mobility comments: HOB elevated 20  Transfers Overall transfer level: Needs assistance   Transfers: Sit to/from Stand Sit to Stand: Min assist         General transfer comment: Pt had difficulties fully standing up without cues to push through feet and use glutes to extend her hips. She stood half way up and had to sit without assistance  Ambulation/Gait Ambulation/Gait assistance: Min guard Ambulation Distance (Feet): 5 Feet Assistive device: Rolling walker (2 wheeled) Gait Pattern/deviations: Step-to pattern;Decreased step length - left;Decreased stance time - left;Shuffle Gait velocity: Pt moved very slow and deliberately Gait velocity interpretation: Below normal speed for age/gender General Gait Details: had difficulty stepping forward with L LE, did  not WB much on L LE. Needed VC for posture and sequence  Stairs            Wheelchair Mobility    Modified Rankin (Stroke Patients Only)       Balance Overall balance assessment: Needs assistance Sitting-balance support: Bilateral upper extremity supported;Feet unsupported Sitting balance-Leahy Scale: Fair     Standing balance support: Bilateral upper extremity supported Standing balance-Leahy Scale: Poor Standing balance comment: Needed RW for stability                             Pertinent Vitals/Pain Pain Assessment: 0-10 Pain Score: 4  Pain Descriptors / Indicators: Aching Pain Intervention(s): Limited activity within patient's tolerance;Repositioned    Home Living Family/patient expects to be discharged to:: Private residence Living Arrangements: Alone Available Help at Discharge: Family Type of Home: House Home Access: Stairs to enter Entrance Stairs-Rails: Left Entrance Stairs-Number of Steps: 2 Home Layout: One level Home Equipment: Shower seat;Bedside commode;Walker - 2 wheels      Prior Function Level of Independence: Independent         Comments: has assist 1hr 3x/wk     Hand Dominance        Extremity/Trunk Assessment   Upper Extremity Assessment: Generalized weakness           Lower Extremity Assessment: Generalized weakness      Cervical / Trunk Assessment: Kyphotic  Communication   Communication: No difficulties  Cognition Arousal/Alertness: Awake/alert Behavior During Therapy: WFL for tasks assessed/performed Overall Cognitive Status: Within Functional Limits for tasks assessed  General Comments      Exercises General Exercises - Lower Extremity Long Arc Quad: AROM;Left;10 reps;Seated Hip ABduction/ADduction: AROM;Both;10 reps Hip Flexion/Marching: Left;10 reps;AAROM;Seated      Assessment/Plan    PT Assessment Patient needs continued PT services  PT Diagnosis  Difficulty walking;Generalized weakness;Acute pain   PT Problem List Decreased strength;Decreased activity tolerance;Decreased balance;Decreased mobility;Decreased knowledge of use of DME;Decreased safety awareness;Pain;Decreased range of motion  PT Treatment Interventions DME instruction;Gait training;Stair training;Functional mobility training;Therapeutic exercise;Patient/family education   PT Goals (Current goals can be found in the Care Plan section) Acute Rehab PT Goals Patient Stated Goal: Return home PT Goal Formulation: With patient/family Time For Goal Achievement: 02/05/15 Potential to Achieve Goals: Fair    Frequency Min 5X/week   Barriers to discharge Decreased caregiver support      Co-evaluation               End of Session Equipment Utilized During Treatment: Gait belt Activity Tolerance: Patient limited by fatigue Patient left: in chair;with call bell/phone within reach;with family/visitor present Nurse Communication: Mobility status         Time: 1005-1031 PT Time Calculation (min) (ACUTE ONLY): 26 min   Charges:   PT Evaluation $Initial PT Evaluation Tier I: 1 Procedure PT Treatments $Therapeutic Activity: 8-22 mins   PT G CodesJimmy Picket 02/27/2015, 11:01 AM Jimmy Picket, SPT 2015/02/27 11:01 AM

## 2015-01-29 NOTE — Progress Notes (Signed)
Called and notified Dr. Jerral RalphGhimire that the patient has not urinated since foley cath removed at 0645H. Bladder scan done at 2pm with 89 ml retention noted. 480 cc total of fluid intake since 7am. Telephone Order given to give 250 ccNS IV bolus

## 2015-01-29 NOTE — NC FL2 (Signed)
[place] LEVEL OF CARE SCREENING TOOL     IDENTIFICATION  Patient Name: [name] Birthdate: 1928/04/11 Sex: female Admission Date (Current Location): 01/26/2015  [facility] and [place] Number: Herbalist and Address:  [facility], [place] [address] [place], [place] [address]      Provider Number: [id number]  Attending Physician Name and Address:  Jonetta Osgood, MD  Relative Name and Phone Number:  Garfield Cornea - daughter.  [phone] (mobile)    Current Level of Care: Hospital Recommended Level of Care: Nursing Facility Prior Approval Number:    Date Approved/Denied:   PASRR Number: [id number]  Discharge Plan: SNF    Current Diagnoses: Patient Active Problem List   Diagnosis Date Noted  . Displaced fracture of left femoral neck ([place]) 01/28/2015  . Fracture of femoral neck, left ([place]) 01/26/2015  . Fall at home 01/26/2015  . Left displaced femoral neck fracture ([place]) 01/26/2015  . Faintness   . Dementia without behavioral disturbance 01/10/2015  . Aortic stenosis 06/19/2014  . Orthostatic hypotension 07/16/2013  . Myelopathy ([place]) 12/07/2011  . Depression 12/04/2011  . Fever 12/03/2011  . Pain of upper extremity 12/01/2011  . Upper extremity weakness 11/24/2011  . UTI (lower urinary tract infection) 11/18/2011  . Altered mental status 11/18/2011  . Fall 11/18/2011  . Shortness of breath   . CAD- CABG '00. Cath in '09 and 05/29/13- patent grafts   . Hx of CABG   . Ejection fraction   . Hypertension   . Palpitations   . Dyslipidemia   . Diabetes mellitus type II, uncontrolled ([place])   . Carotid artery disease ([place])   . Dizziness   . Cervical spine disease   . Hyperkalemia     Orientation ACTIVITIES/SOCIAL BLADDER RESPIRATION    Self, Time, Situation, Place  Family supportive Continent Normal  BEHAVIORAL SYMPTOMS/MOOD NEUROLOGICAL BOWEL NUTRITION STATUS      Continent Diet (Regular diet)  PHYSICIAN  VISITS COMMUNICATION OF NEEDS Height & Weight Skin    Verbally   140 lbs. Other (Comment), Normal (Incision left hip)          AMBULATORY STATUS RESPIRATION     (Per PT, patient minimum assist with ambulation) Normal      Personal Care Assistance Level of Assistance  Bathing, Feeding, Dressing Bathing Assistance: Limited assistance Feeding assistance: Independent Dressing Assistance: Limited assistance      Functional Limitations Info                [place]  PT (By licensed PT)     PT Frequency: Evaluated 11/18. Minimum 5x per week recommended             Additional Factors Info  Code Status, Allergies, Insulin Sliding Scale Code Status Info: Full code Allergies Info: Codeine   Insulin Sliding Scale Info: 6 times per week during hospital stay       Current Medications (01/29/2015): Current Facility-Administered Medications  Medication Dose Route Frequency Provider Last Rate Last Dose  . acetaminophen (TYLENOL) tablet 650 mg  650 mg Oral Q6H PRN Rod Can, MD       Or  . acetaminophen (TYLENOL) suppository 650 mg  650 mg Rectal Q6H PRN Rod Can, MD      . albuterol (PROVENTIL) (2.5 MG/3ML) 0.083% nebulizer solution 2.5 mg  2.5 mg Nebulization Q2H PRN Jonetta Osgood, MD      . amLODipine (NORVASC) tablet 10 mg  10 mg  Oral Daily Samella Parr, NP   Stopped at 01/29/15 1045  . [START ON 01/30/2015] aspirin EC tablet 325 mg  325 mg Oral Daily Shanker Kristeen Mans, MD      . clopidogrel (PLAVIX) tablet 75 mg  75 mg Oral Daily Jonetta Osgood, MD   75 mg at 01/29/15 1506  . DULoxetine (CYMBALTA) DR capsule 60 mg  60 mg Oral Daily Samella Parr, NP   60 mg at 01/29/15 0946  . feeding supplement (ENSURE ENLIVE) (ENSURE ENLIVE) liquid 237 mL  237 mL Oral TID BM Dale Mulberry, RD   237 mL at 01/29/15 1350  . gabapentin (NEURONTIN) capsule 200 mg  200 mg Oral BID Samella Parr, NP   200 mg at 01/29/15 0946  . insulin aspart  (novoLOG) injection 0-15 Units  0-15 Units Subcutaneous 6 times per day Samella Parr, NP   5 Units at 01/29/15 1303  . LORazepam (ATIVAN) tablet 0.5 mg  0.5 mg Oral BID Samella Parr, NP   0.5 mg at 01/29/15 0945  . memantine (NAMENDA) tablet 10 mg  10 mg Oral BID Samella Parr, NP   10 mg at 01/29/15 0946  . menthol-cetylpyridinium (CEPACOL) lozenge 3 mg  1 lozenge Oral PRN Rod Can, MD       Or  . phenol (CHLORASEPTIC) mouth spray 1 spray  1 spray Mouth/Throat PRN Rod Can, MD      . metoCLOPramide (REGLAN) tablet 5-10 mg  5-10 mg Oral Q8H PRN Rod Can, MD       Or  . metoCLOPramide (REGLAN) injection 5-10 mg  5-10 mg Intravenous Q8H PRN Rod Can, MD      . morphine 2 MG/ML injection 0.5 mg  0.5 mg Intravenous Q2H PRN Rod Can, MD   0.5 mg at 01/28/15 2115  . ondansetron (ZOFRAN) tablet 4 mg  4 mg Oral Q6H PRN Rod Can, MD       Or  . ondansetron [facility]) injection 4 mg  4 mg Intravenous Q6H PRN Rod Can, MD      . oxyCODONE (Oxy IR/ROXICODONE) immediate release tablet 5 mg  5 mg Oral Q4H PRN Jonetta Osgood, MD   5 mg at 01/29/15 1046  . polyethylene glycol (MIRALAX / GLYCOLAX) packet 17 g  17 g Oral Daily Jonetta Osgood, MD   17 g at 01/29/15 0946  . potassium chloride SA (K-DUR,KLOR-CON) CR tablet 40 mEq  40 mEq Oral Daily Jonetta Osgood, MD   40 mEq at 01/29/15 0946  . senna-docusate (Senokot-S) tablet 1 tablet  1 tablet Oral QHS Shanker Kristeen Mans, MD       Do not use this list as official medication orders. Please verify with discharge summary.  Discharge Medications:   Medication List    STOP taking these medications        aspirin 81 MG tablet  Replaced by:  aspirin EC 325 MG tablet      TAKE these medications        aspirin EC 325 MG tablet  Take 1 tablet (325 mg total) by mouth daily with breakfast.     clopidogrel 75 MG tablet  Commonly known as:  PLAVIX  Take 1 tablet (75 mg total) by mouth daily. Continue on  hold for 2 more weeks.     HYDROcodone-acetaminophen 5-325 MG tablet  Commonly known as:  NORCO  Take 1-2 tablets by mouth every 4 (four) hours as needed for moderate  pain.      ASK your doctor about these medications        ACCU-CHEK AVIVA PLUS test strip  Generic drug:  glucose blood  USE TO TEST YOUR BLOOD SUGAR 3-4 TIMES A DAY     ACCU-CHEK AVIVA PLUS W/DEVICE Kit     amLODipine 10 MG tablet  Commonly known as:  NORVASC  Take 1 tablet (10 mg total) by mouth daily.     atorvastatin 20 MG tablet  Commonly known as:  LIPITOR  Take 1 tablet (20 mg total) by mouth daily.     buPROPion 100 MG tablet  Commonly known as:  WELLBUTRIN  Take 1 tablet (100 mg total) by mouth 2 (two) times daily.     DULoxetine 60 MG capsule  Commonly known as:  CYMBALTA  Take 60 mg by mouth daily.     feeding supplement (ENSURE) Pudg  Take 1 Container by mouth 2 (two) times daily with a meal.     feeding supplement (ENSURE COMPLETE) Liqd  Take 237 mLs by mouth 2 (two) times daily between meals.     furosemide 20 MG tablet  Commonly known as:  LASIX  Take 20 mg by mouth as needed for fluid (PATIENT TAKES ONE TABLET BY MOUTH AS NEEDED FOR FLUID RETENSION).     gabapentin 100 MG capsule  Commonly known as:  NEURONTIN  Take 200 mg by mouth 2 (two) times daily.     glimepiride 2 MG tablet  Commonly known as:  AMARYL  Take 4 mg by mouth daily with breakfast.     LORazepam 0.5 MG tablet  Commonly known as:  ATIVAN  Take 1 tablet (0.5 mg total) by mouth 2 (two) times daily.     Memantine HCl-Donepezil HCl 28-10 MG Cp24  Commonly known as:  NAMZARIC  Take 1 tablet by mouth at bedtime.     memantine tablet pack  Commonly known as:  NAMENDA TITRATION PAK  5 mg/day for =1 week; 5 mg twice daily for =1 week; 15 mg/day given in 5 mg and 10 mg separated doses for =1 week; then 10 mg twice daily     metFORMIN 500 MG 24 hr tablet  Commonly known as:  GLUCOPHAGE-XR  Take 500-1,000 mg by mouth  daily with breakfast. 1000 mg in morning and 500 at  Night     nitroGLYCERIN 0.4 MG SL tablet  Commonly known as:  NITROSTAT  Place 1 tablet (0.4 mg total) under the tongue every 5 (five) minutes as needed. For chest pain     pantoprazole 40 MG tablet  Commonly known as:  PROTONIX  Take 1 tablet (40 mg total) by mouth daily.     sitaGLIPtin 100 MG tablet  Commonly known as:  JANUVIA  Take 100 mg by mouth daily.        Relevant Imaging Results:  Relevant Lab Results:  Recent Labs    Additional Information    Sable Feil, LCSW

## 2015-01-30 LAB — BASIC METABOLIC PANEL
Anion gap: 6 (ref 5–15)
BUN: 23 mg/dL — AB (ref 6–20)
CHLORIDE: 104 mmol/L (ref 101–111)
CO2: 24 mmol/L (ref 22–32)
CREATININE: 1.27 mg/dL — AB (ref 0.44–1.00)
Calcium: 8.6 mg/dL — ABNORMAL LOW (ref 8.9–10.3)
GFR calc Af Amer: 44 mL/min — ABNORMAL LOW (ref >60)
GFR calc non Af Amer: 38 mL/min — ABNORMAL LOW (ref >60)
Glucose, Bld: 199 mg/dL — ABNORMAL HIGH (ref 65–99)
Potassium: 4.9 mmol/L (ref 3.5–5.1)
Sodium: 134 mmol/L — ABNORMAL LOW (ref 135–145)

## 2015-01-30 LAB — CBC
HEMATOCRIT: 27.9 % — AB (ref 36.0–46.0)
HEMOGLOBIN: 9.1 g/dL — AB (ref 12.0–15.0)
MCH: 30.3 pg (ref 26.0–34.0)
MCHC: 32.6 g/dL (ref 30.0–36.0)
MCV: 93 fL (ref 78.0–100.0)
Platelets: 253 10*3/uL (ref 150–400)
RBC: 3 MIL/uL — ABNORMAL LOW (ref 3.87–5.11)
RDW: 12.9 % (ref 11.5–15.5)
WBC: 7.2 10*3/uL (ref 4.0–10.5)

## 2015-01-30 LAB — GLUCOSE, CAPILLARY
GLUCOSE-CAPILLARY: 139 mg/dL — AB (ref 65–99)
GLUCOSE-CAPILLARY: 225 mg/dL — AB (ref 65–99)
GLUCOSE-CAPILLARY: 182 mg/dL — AB (ref 65–99)
GLUCOSE-CAPILLARY: 197 mg/dL — AB (ref 65–99)
Glucose-Capillary: 120 mg/dL — ABNORMAL HIGH (ref 65–99)
Glucose-Capillary: 260 mg/dL — ABNORMAL HIGH (ref 65–99)

## 2015-01-30 MED ORDER — SENNOSIDES-DOCUSATE SODIUM 8.6-50 MG PO TABS: 1.0000 | ORAL_TABLET | Freq: Every day | ORAL | Status: DC

## 2015-01-30 MED ORDER — MEMANTINE HCL 10 MG PO TABS: 10.0000 mg | ORAL_TABLET | Freq: Two times a day (BID) | ORAL | Status: DC

## 2015-01-30 MED ORDER — ENSURE ENLIVE PO LIQD: 237.0000 mL | Freq: Three times a day (TID) | ORAL | Status: DC

## 2015-01-30 MED ORDER — ALBUTEROL SULFATE (2.5 MG/3ML) 0.083% IN NEBU: 2.5000 mg | INHALATION_SOLUTION | RESPIRATORY_TRACT | Status: AC | PRN

## 2015-01-30 MED ORDER — OXYCODONE HCL 5 MG PO TABS: 5.0000 mg | ORAL_TABLET | ORAL | Status: DC | PRN

## 2015-01-30 MED ORDER — ACETAMINOPHEN 500 MG PO TABS: 1000.0000 mg | ORAL_TABLET | Freq: Three times a day (TID) | ORAL | Status: AC

## 2015-01-30 MED ORDER — LORAZEPAM 0.5 MG PO TABS: 0.5000 mg | ORAL_TABLET | Freq: Two times a day (BID) | ORAL | Status: DC

## 2015-01-30 MED ORDER — POLYETHYLENE GLYCOL 3350 17 G PO PACK: 17.0000 g | PACK | Freq: Every day | ORAL | Status: DC | PRN

## 2015-01-30 NOTE — Clinical Social Work Note (Signed)
Clinical Social Worker facilitated patient discharge including contacting patient family and facility to confirm patient discharge plans.  Clinical information faxed to facility and family agreeable with plan.  [facility]  RN to call report prior to discharge.  Clinical Social Worker will sign off for now as social work intervention is no longer needed. Please consult us again if new need arises.  [name], MSW, LCSWA [phone] 01/30/2015 2:56 PM

## 2015-01-30 NOTE — Progress Notes (Signed)
Physical Therapy Treatment Patient Details Name: Sara HusbandsShirley [name] MRN: [id number] DOB: 11/02/1928 Today's Date: 01/30/2015    History of Present Illness Pt admitted after fall with Left femur fx s/p hemiarthroplasty with anterior approach. PMHx: CABG, CAD, dizziness, cervical disease    PT Comments    Pt with limited activity tolerance and increased overall assistance needed with this session. Acute PT to continue.  Follow Up Recommendations  SNF     Equipment Recommendations  Other (comment) (TBD by next venue)    Recommendations for Other Services OT consult     Precautions / Restrictions Precautions Precautions: Fall Restrictions LLE Weight Bearing: Non weight bearing    Mobility  Bed Mobility Overal bed mobility: Needs Assistance       Supine to sit: Mod assist     General bed mobility comments: with bed elevated and rails/pad under pt's hips used. increased time for pt mobility needed with step by step cues on sequencing and technique.  Transfers Overall transfer level: Needs assistance Equipment used: Rolling walker (2 wheeled) Transfers: Sit to/from [employer]GI CorporationStand;Stand Pivot Transfers Sit to Stand: Mod assist Stand pivot transfers: Mod assist;+2 safety/equipment       General transfer comment: mod to max assist to stand x 2 bed to RW, multimodal cueing for upright posture, hand position for safety and use of walker. Pt with significant forward flexion and with facilitation to correct this pt would sit down. second person in for stand pivot to chair from bed without walker. Pt needed mod to max assist to stand supported, multimodal cues to advance feet from bed to chair during stand pivot transfer. Was noted pt had urinary incontinence during this process. Pt did not recall urinating or having need to urinate (was asked prior to 1st stand in bathroom was needed). total assist for pericare/cleaning up.                                                           Cognition Arousal/Alertness: Lethargic   Overall Cognitive Status: Impaired/Different from baseline Area of Impairment: Orientation Orientation Level: Disoriented to;Place;Situation   Memory: Decreased recall of precautions         General Comments: pt stated she was "at home". reoriented her to [place]. did not recall having hip surgery, reoriented her to this as well. Pt needed increased time to process instructions today and significantly increased assistance with multimodall cueing . Once pt was woken up, she was alert for session. Pt with incontinent episode on transfer to chair.                                         Exercises Total Joint Exercises Ankle Circles/Pumps: AAROM;Left;10 reps;Supine;Limitations Ankle Circles/Pumps Limitations: cues to redirect pt to task for assistance with exercise, otherwise passive (same for all other exercies listed below) Quad Sets: AAROM;Left;10 reps;Supine;Strengthening Short Arc Quad: AAROM;Strengthening;Left;5 reps;Supine Heel Slides: AAROM;Strengthening;Left;10 reps;Supine     Pertinent Vitals/Pain Pain Assessment: 0-10 Pain Score:  (pt unable to rate) Pain Location: left hip Pain Intervention(s): Limited activity within patient's tolerance;Monitored during session;Premedicated before session;Repositioned;Ice applied     PT Goals (current goals can now be found in the care plan section) Acute Rehab PT Goals Patient  Stated Goal: Return home PT Goal Formulation: With patient/family Time For Goal Achievement: 02/05/15 Potential to Achieve Goals: Fair Progress towards PT goals: Progressing toward goals    Frequency  Min 5X/week    PT Plan Current plan remains appropriate    End of Session Equipment Utilized During Treatment: Gait belt Activity Tolerance: Patient limited by lethargy;Patient limited by pain Patient left: in chair;with call bell/phone within reach;with nursing/sitter in room     Time: 0454-0981 PT Time  Calculation (min) (ACUTE ONLY): 38 min  Charges:  $Therapeutic Exercise: 8-22 mins $Therapeutic Activity: 23-37 mins                    Sallyanne Kuster 01/30/2015, 1:39 PM   Sallyanne Kuster, PTA, CLT Acute Rehab Services [phone] 01/30/2015, 1:41 PM

## 2015-01-30 NOTE — Progress Notes (Addendum)
Subjective: 2 Days Post-Op Procedure(s) (LRB): ARTHROPLASTY BIPOLAR HIP (HEMIARTHROPLASTY) (Left) Patient reports pain as moderate.  Reports incisional pain. No other c/o. Eating breakfast during my exam this AM.  Objective: Vital signs in last 24 hours: Temp:  [97.8 F (36.6 C)-99.7 F (37.6 C)] 99.7 F (37.6 C) (11/18 2136) Pulse Rate:  [66-83] 83 (11/18 2136) Resp:  [16] 16 (11/18 2136) BP: (108-135)/(43-45) 124/45 mmHg (11/18 2136) SpO2:  [94 %-96 %] 95 % (11/18 2136)  Intake/Output from previous day: 11/18 0701 - 11/19 0700 In: 630 [P.O.:480; I.V.:150] Out: 550 [Urine:550] Intake/Output this shift: Total I/O In: 120 [P.O.:120] Out: -    Recent Labs  01/28/15 0640 01/29/15 0412 01/30/15 0300  HGB 9.6* 8.7* 9.1*    Recent Labs  01/29/15 0412 01/30/15 0300  WBC 8.0 7.2  RBC 2.98* 3.00*  HCT 27.8* 27.9*  PLT 235 253    Recent Labs  01/29/15 0412 01/30/15 0300  NA 137 134*  K 4.5 4.9  CL 107 104  CO2 23 24  BUN 15 23*  CREATININE 1.21* 1.27*  GLUCOSE 95 199*  CALCIUM 8.2* 8.6*   No results for input(s): LABPT, INR in the last 72 hours.  Neurologically intact ABD soft Neurovascular intact Sensation intact distally Intact pulses distally Dorsiflexion/Plantar flexion intact Incision: dressing C/D/I and no drainage No cellulitis present Compartment soft no sign of DVT  Assessment/Plan: 2 Days Post-Op Procedure(s) (LRB): ARTHROPLASTY BIPOLAR HIP (HEMIARTHROPLASTY) (Left) Advance diet Up with therapy D/C IV fluids  WBAT with walker Plavix and ASA for DVT ppx Discussed with hospitalist Dr. Jerral RalphGhimire who plans for D/C today Follow up in office with Dr. Linna CapriceSwinteck in 2 weeks  BISSELL, Lockie ParesJACLYN M. 01/30/2015, 9:40 AM

## 2015-01-30 NOTE — Discharge Summary (Signed)
PATIENT DETAILS Name: [name] Age: 81 y.o. Sex: female Date of Birth: 04/09/1928 MRN: [id number]. Admitting Physician: Waldemar Dickens, MD FIE:PPIRJJ,OACZYSAYT Nicole Kindred, MD  Admit Date: 01/26/2015 Discharge date: 01/30/2015  Recommendations for Outpatient Follow-up:  1. Please ensure follow-up with orthopedics-Dr. Delfino Lovett 2. Continue aspirin/Plavix/TED hose for DVT prophylaxis 3. Please repeat CBC/BMET in 1 week  PRIMARY DISCHARGE DIAGNOSIS:  Principal Problem:   Fracture of femoral neck, left (HCC) Active Problems:   CAD- CABG '00. Cath in '09 and 05/29/13- patent grafts   Hypertension   Dyslipidemia   Diabetes mellitus type II, uncontrolled ([place])   Aortic stenosis   Dementia without behavioral disturbance   Fall at home   Left displaced femoral neck fracture ([place])   Displaced fracture of left femoral neck ([place])      PAST MEDICAL HISTORY: Past Medical History  Diagnosis Date  . Shortness of breath   . CAD (coronary artery disease)     Last catheterization 2009, grafts patent, EF 70%, 80% small OM with possible mild ischemic  . Hx of CABG     2000, LIMA to LAD, SVG to diagonal, SVG to OM, SVG to posterior descending  . Ejection fraction     EF 70%,, 2009 /  EF 65%, echo, October, 2011  . Hypertension   . Palpitations   . Dyslipidemia   . Diabetes mellitus   . Pancreatitis   . Carotid artery disease (HCC)     Mild, Doppler, 2000  . Dizziness     Evaluated April, 2008, no treatment needed  . Cervical spine disease     Severe neck pain, 2011  . Hyperkalemia     Her doctor Drema Dallas, September, 2012, ACE inhibitor stopped, probable amlodipine to be  . Shortness of breath   . History of hysterectomy   . Chest pain     Chest hurting, May, 2013    DISCHARGE MEDICATIONS: Current Discharge Medication List    START taking these medications   Details  acetaminophen (TYLENOL) 500 MG tablet Take 2 tablets (1,000 mg total) by mouth every 8 (eight)  hours. Refills: 0    albuterol (PROVENTIL) (2.5 MG/3ML) 0.083% nebulizer solution Take 3 mLs (2.5 mg total) by nebulization every 2 (two) hours as needed for shortness of breath.    aspirin EC 325 MG tablet Take 1 tablet (325 mg total) by mouth daily with breakfast. Qty: 30 tablet, Refills: 0    oxyCODONE (OXY IR/ROXICODONE) 5 MG immediate release tablet Take 1 tablet (5 mg total) by mouth every 4 (four) hours as needed for moderate pain. Qty: 20 tablet, Refills: 0    polyethylene glycol (MIRALAX / GLYCOLAX) packet Take 17 g by mouth daily as needed.    senna-docusate (SENOKOT-S) 8.6-50 MG tablet Take 1 tablet by mouth at bedtime.      CONTINUE these medications which have CHANGED   Details  feeding supplement, ENSURE ENLIVE, (ENSURE ENLIVE) LIQD Take 237 mLs by mouth 3 (three) times daily between meals.    LORazepam (ATIVAN) 0.5 MG tablet Take 1 tablet (0.5 mg total) by mouth 2 (two) times daily. Qty: 20 tablet, Refills: 0    memantine (NAMENDA) 10 MG tablet Take 1 tablet (10 mg total) by mouth 2 (two) times daily.      CONTINUE these medications which have NOT CHANGED   Details  amLODipine (NORVASC) 10 MG tablet Take 1 tablet (10 mg total) by mouth daily. Qty: 90 tablet, Refills: 1    atorvastatin (LIPITOR) 20 MG  tablet Take 1 tablet (20 mg total) by mouth daily. Qty: 90 tablet, Refills: 0    buPROPion (WELLBUTRIN) 100 MG tablet Take 1 tablet (100 mg total) by mouth 2 (two) times daily. Qty: 60 tablet, Refills: 11    clopidogrel (PLAVIX) 75 MG tablet Take 1 tablet (75 mg total) by mouth daily. Continue on hold for 2 more weeks. Qty: 90 tablet, Refills: 2    DULoxetine (CYMBALTA) 60 MG capsule Take 60 mg by mouth daily.    furosemide (LASIX) 20 MG tablet Take 20 mg by mouth as needed for fluid (PATIENT TAKES ONE TABLET BY MOUTH AS NEEDED FOR FLUID RETENSION).     gabapentin (NEURONTIN) 100 MG capsule Take 200 mg by mouth 2 (two) times daily.    glimepiride (AMARYL) 2 MG  tablet Take 4 mg by mouth daily with breakfast.     metFORMIN (GLUCOPHAGE-XR) 500 MG 24 hr tablet Take 500-1,000 mg by mouth daily with breakfast. 1000 mg in morning and 500 at  Night    pantoprazole (PROTONIX) 40 MG tablet Take 1 tablet (40 mg total) by mouth daily. Qty: 90 tablet, Refills: 1    sitaGLIPtin (JANUVIA) 100 MG tablet Take 100 mg by mouth daily.      nitroGLYCERIN (NITROSTAT) 0.4 MG SL tablet Place 1 tablet (0.4 mg total) under the tongue every 5 (five) minutes as needed. For chest pain Qty: 25 tablet, Refills: 6   Associated Diagnoses: CAD (coronary artery disease); Hypertension      STOP taking these medications     ACCU-CHEK AVIVA PLUS test strip      aspirin 81 MG tablet      Blood Glucose Monitoring Suppl (ACCU-CHEK AVIVA PLUS) W/DEVICE KIT      feeding supplement (ENSURE) PUDG      Memantine HCl-Donepezil HCl (NAMZARIC) 28-10 MG CP24         ALLERGIES:   Allergies  Allergen Reactions  . Codeine Hives    BRIEF HPI:  See H&P, Labs, Consult and Test reports for all details in brief, patient an 81 year old female patient with history of CAD with CABG in 2000, status post catheterization March 2015 with patent grafts who sustained a mechanical fall and resultant left hip fracture. Patient was admitted for further evaluation and treatment  CONSULTATIONS:   orthopedic surgery  PERTINENT RADIOLOGIC STUDIES: Dg Knee 1-2 Views Left  02-19-15  CLINICAL DATA:  Acute onset of left hip pain after fall. Initial encounter. EXAM: LEFT KNEE - 1-2 VIEW COMPARISON:  None. FINDINGS: No acute fracture is seen at the knee. The distal femur appears intact. A small knee joint effusion is noted. There is suggestion of mild lateral compartment narrowing. No definite soft tissue abnormalities are otherwise characterized. IMPRESSION: 1. No evidence of fracture or dislocation. 2. Small knee joint effusion noted. 3. Suggestion of mild lateral compartment narrowing. Electronically  Signed   By: Garald Balding M.D.   On: 2015/02/19 18:52   Ct Pelvis Wo Contrast  2015/02/19  CLINICAL DATA:  Left hip and pelvic pain, after collapsing when standing up out of bed. Question for pubic symphysis fracture on radiograph. Further evaluation requested. Initial encounter. EXAM: CT PELVIS WITHOUT CONTRAST TECHNIQUE: Multidetector CT imaging of the pelvis was performed following the standard protocol without intravenous contrast. COMPARISON:  Left hip radiographs performed earlier today at 2:59 p.m. FINDINGS: There is a minimally displaced subcapital fracture through the left femoral neck, with slight comminution. No additional fracture is seen. The pubic symphysis demonstrates mild degenerative  change but is otherwise unremarkable in appearance. The right hip joint appears intact. Degenerative change is noted at the lower lumbar spine, with vacuum phenomenon at L4-L5 and L5-S1. The sacroiliac joints are grossly unremarkable, aside from mild sclerotic change at the left sacroiliac joint. Scattered vascular calcifications are seen. Diffuse diverticulosis is noted along the distal descending and sigmoid colon. A clip is noted at the right inguinal region. No definite significant soft tissue injury is characterized. No focal soft tissue hematoma is seen. IMPRESSION: 1. Minimally displaced subcapital fracture through the left femoral neck, with slight comminution. No additional fracture seen. 2. Degenerative change at the lower lumbar spine. 3. Scattered vascular calcifications seen. 4. Diffuse diverticulosis along the distal descending and sigmoid colon. Electronically Signed   By: Garald Balding M.D.   On: 01/26/2015 18:19   Pelvis Portable  01/28/2015  CLINICAL DATA:  S/p left total hip EXAM: PORTABLE PELVIS 1-2 VIEWS COMPARISON:  CT 01/26/2015 FINDINGS: Changes of left hip arthroplasty. No fracture. No dislocation. Regional subcutaneous gas. IMPRESSION: 1. Interval left hip arthroplasty without    apparent complication. Electronically Signed   By: Lucrezia Europe M.D.   On: 01/28/2015 20:29   Dg Chest Port 1 View  01/26/2015  CLINICAL DATA:  Preop.  Left femoral neck fracture. EXAM: PORTABLE CHEST 1 VIEW COMPARISON:  07/03/2013 FINDINGS: Sequelae of prior CABG are again identified. The patient has taken a shallower inspiration than on the prior study and there is bronchovascular crowding with mild left basilar atelectasis. No pleural effusion or pneumothorax is identified. Surgical clips are noted in the region of the GE junction. IMPRESSION: Shallow inspiration with left basilar atelectasis. Electronically Signed   By: Logan Bores M.D.   On: 01/26/2015 16:34   Dg Hip Operative Unilat With Pelvis Left  01/28/2015  CLINICAL DATA:  Left anterior hip replacement EXAM: OPERATIVE LEFT HIP (WITH PELVIS IF PERFORMED) 3 VIEWS TECHNIQUE: Fluoroscopic spot image(s) were submitted for interpretation post-operatively. FLUOROSCOPY TIME:  11 seconds COMPARISON:  Left hip radiographs dated 01/26/2015 FINDINGS: Intraoperative fluoroscopic spot images during left hip arthroplasty. IMPRESSION: Intraoperative fluoroscopic radiographs, as above. Electronically Signed   By: Julian Hy M.D.   On: 01/28/2015 19:26   Dg Hip Unilat With Pelvis 2-3 Views Left  01/26/2015  CLINICAL DATA:  Fall at home one week ago with left hip pain. EXAM: DG HIP (WITH OR WITHOUT PELVIS) 2-3V LEFT COMPARISON:  None. FINDINGS: Examination demonstrates a minimally displaced subcapital to transcervical fracture of the left femoral neck could not exclude a nondisplaced fracture of the symphysis bilaterally. There are mild symmetric degenerative changes of the hips. There are degenerative changes of the spine. IMPRESSION: Minimally displaced left femoral neck fracture. Cannot exclude subtle fracture involving the symphysis bilaterally. Electronically Signed   By: Marin Olp M.D.   On: 01/26/2015 15:27     PERTINENT LAB  RESULTS: CBC:  Recent Labs  01/29/15 0412 01/30/15 0300  WBC 8.0 7.2  HGB 8.7* 9.1*  HCT 27.8* 27.9*  PLT 235 253   CMET CMP     Component Value Date/Time   NA 134* 01/30/2015 0300   K 4.9 01/30/2015 0300   CL 104 01/30/2015 0300   CO2 24 01/30/2015 0300   GLUCOSE 199* 01/30/2015 0300   BUN 23* 01/30/2015 0300   CREATININE 1.27* 01/30/2015 0300   CALCIUM 8.6* 01/30/2015 0300   PROT 8.0 01/26/2015 1640   ALBUMIN 2.9* 01/26/2015 2055   AST 32 01/26/2015 1640   ALT 14 01/26/2015  1640   ALKPHOS 76 01/26/2015 1640   BILITOT 0.5 01/26/2015 1640   GFRNONAA 38* 01/30/2015 0300   GFRAA 44* 01/30/2015 0300    GFR CrCl cannot be calculated (Unknown ideal weight.). No results for input(s): LIPASE, AMYLASE in the last 72 hours. No results for input(s): CKTOTAL, CKMB, CKMBINDEX, TROPONINI in the last 72 hours. Invalid input(s): POCBNP No results for input(s): DDIMER in the last 72 hours. No results for input(s): HGBA1C in the last 72 hours. No results for input(s): CHOL, HDL, LDLCALC, TRIG, CHOLHDL, LDLDIRECT in the last 72 hours. No results for input(s): TSH, T4TOTAL, T3FREE, THYROIDAB in the last 72 hours.  Invalid input(s): FREET3 No results for input(s): VITAMINB12, FOLATE, FERRITIN, TIBC, IRON, RETICCTPCT in the last 72 hours. Coags: No results for input(s): INR in the last 72 hours.  Invalid input(s): PT Microbiology: No results found for this or any previous visit (from the past 240 hour(s)).   BRIEF HOSPITAL COURSE:  Fracture of femoral neck, left: following a mechanical fall-patient never lost consciousness therefore doubt syncope. Ortho consulted-underwent left hip hemiarthroplasty on 11/17. Recommendations from orthopedics are to continue both aspirin and Plavix along with TED hose for DVT prophylaxis and to follow-up with them in 2 weeks time. Spoke with orthopedic PA today-ok for d/c.  Anemia: Suspect anemia secondary to perioperative blood loss-hemoglobin  stable at 9.1. Please check CBC in 1 week.   History of Aortic stenosis:Echo on 11/16 showed only mild stenosis. Will need periodic outpatient monitoring.  CAD: Aspirin/Plavix initially had an anticipation of surgical repair-restarted on 11/18. Note Cath 2009, with patent grafts  Mild acute kidney injury: creatinine improved, ?pre-renal azotemia-but could have mild CKD due to DM.Repeat lytes in 1 week  Hypokalemia:?secondary to lasix use.resolved.  Hypertension:stable,continue on amlodipine  Diabetes mellitus type II:A1c 8.4-CBG's stable with SSI while inpatient-resume oral hypoglycemics on discharge.   Dementia without behavioral disturbance:stable-mild confusion today-is at risk for further delirium-minimize narcotics and sedatives as much as possible. Continue Namenda.   TODAY-DAY OF DISCHARGE:  Subjective:   Semaja Lymon today has no headache,no chest abdominal pain,no new weakness tingling or numbness. Continues to have some pain at the operative site-but otherwise seems to be doing well. Foley catheter removed, voiding spontaneously. Tolerating diet.  Objective:   Blood pressure 124/45, pulse 83, temperature 99.7 F (37.6 C), temperature source Oral, resp. rate 16, SpO2 95 %.  Intake/Output Summary (Last 24 hours) at 01/30/15 0936 Last data filed at 01/30/15 0847  Gross per 24 hour  Intake    510 ml  Output    550 ml  Net    -40 ml   There were no vitals filed for this visit.  Exam Awake Alert, Oriented *3, No new F.N deficits, Normal affect [place].AT,PERRAL Supple Neck,No JVD, No cervical lymphadenopathy appriciated.  Symmetrical Chest wall movement, Good air movement bilaterally, CTAB RRR,No Gallops,Rubs or new Murmurs, No Parasternal Heave +ve B.Sounds, Abd Soft, Non tender, No organomegaly appriciated, No rebound -guarding or rigidity. No Cyanosis, Clubbing or edema, No new Rash or bruise  DISCHARGE CONDITION: Stable  DISPOSITION: SNF  DISCHARGE  INSTRUCTIONS:    Activity:  WBAT with walker Full fall precautions   Get Medicines reviewed and adjusted: Please take all your medications with you for your next visit with your Primary MD  Please request your Primary MD to go over all hospital tests and procedure/radiological results at the follow up, please ask your Primary MD to get all Hospital records sent to his/her office.  If you experience worsening of your admission symptoms, develop shortness of breath, life threatening emergency, suicidal or homicidal thoughts you must seek medical attention immediately by calling 911 or calling your MD immediately  if symptoms less severe.  You must read complete instructions/literature along with all the possible adverse reactions/side effects for all the Medicines you take and that have been prescribed to you. Take any new Medicines after you have completely understood and accpet all the possible adverse reactions/side effects.   Do not drive when taking Pain medications.   Do not take more than prescribed Pain, Sleep and Anxiety Medications  Special Instructions: If you have smoked or chewed Tobacco  in the last 2 yrs please stop smoking, stop any regular Alcohol  and or any Recreational drug use.  Wear Seat belts while driving.  Please note  You were cared for by a hospitalist during your hospital stay. Once you are discharged, your primary care physician will handle any further medical issues. Please note that NO REFILLS for any discharge medications will be authorized once you are discharged, as it is imperative that you return to your primary care physician (or establish a relationship with a primary care physician if you do not have one) for your aftercare needs so that they can reassess your need for medications and monitor your lab values.   Diet recommendation: Diabetic Diet Heart Healthy diet  Discharge Instructions    Call MD for:  redness, tenderness, or signs of infection  (pain, swelling, redness, odor or green/yellow discharge around incision site)    Complete by:  As directed      Diet - low sodium heart healthy    Complete by:  As directed      Diet Carb Modified    Complete by:  As directed      Increase activity slowly    Complete by:  As directed   WBAT with walker           Follow-up Information    Follow up with Swinteck, Horald Pollen, MD. Schedule an appointment as soon as possible for a visit in 2 weeks.   Specialty:  Orthopedic Surgery   Why:  For wound re-check   Contact information:   [address] [place] [address] [phone]       Follow up with Gerrit Heck, MD. Schedule an appointment as soon as possible for a visit in 2 weeks.   Specialty:  Family Medicine   Why:  Hospital follow up   Contact information:   [place] [address] [phone]       Total Time spent on discharge equals 45 minutes.  SignedOren Binet 01/30/2015 9:36 AM

## 2015-01-30 NOTE — Clinical Social Work Placement (Signed)
   CLINICAL SOCIAL WORK PLACEMENT  NOTE  Date:  01/30/2015  Patient Details  Name: [name] MRN: [id number] Date of Birth: 11/29/1928  Clinical Social Work is seeking post-discharge placement for this patient at the Skilled  Nursing Facility level of care (*CSW will initial, date and re-position this form in  chart as items are completed):  Yes   Patient/family provided with [facility] Clinical Social Work Department's list of facilities offering this level of care within the geographic area requested by the patient (or if unable, by the patient's family).  Yes   Patient/family informed of their freedom to choose among providers that offer the needed level of care, that participate in Medicare, Medicaid or managed care program needed by the patient, have an available bed and are willing to accept the patient.  Yes   Patient/family informed of [facility]'s ownership interest [facility] and [facility], as well as of the fact that they are under no obligation to receive care at these facilities.  PASRR submitted to EDS on       PASRR number received on       Existing PASRR number confirmed on 01/29/15     FL2 transmitted to all facilities in geographic area requested by pt/family on 01/29/15     FL2 transmitted to all facilities within larger geographic area on       Patient informed that his/her managed care company has contracts with or will negotiate with certain facilities, including the following:        Yes   Patient/family informed of bed offers received.  Patient chooses bed at  [facility] )     Physician recommends and patient chooses bed at      Patient to be transferred to  [facility] ) on 01/30/15.  Patient to be transferred to facility by  [facility] )     Patient family notified on 01/30/15 of transfer.  Name of family member notified:   (Pt's dtr, [name] )     PHYSICIAN Please sign FL2      Additional Comment:    _______________________________________________ [name], MSW, LCSWA [phone] 01/30/2015 2:56 PM

## 2015-02-01 ENCOUNTER — Other Ambulatory Visit: Payer: Self-pay

## 2015-02-01 MED ORDER — OXYCODONE HCL 5 MG PO TABS: 5.0000 mg | ORAL_TABLET | ORAL | Status: DC | PRN

## 2015-02-01 NOTE — Telephone Encounter (Signed)
[facility]

## 2015-02-02 ENCOUNTER — Encounter (HOSPITAL_COMMUNITY): Payer: Self-pay | Admitting: Emergency Medicine

## 2015-02-02 ENCOUNTER — Emergency Department (HOSPITAL_COMMUNITY): Payer: Medicare Other

## 2015-02-02 ENCOUNTER — Observation Stay (HOSPITAL_COMMUNITY): Payer: Medicare Other

## 2015-02-02 ENCOUNTER — Inpatient Hospital Stay (HOSPITAL_COMMUNITY)
Admission: EM | Admit: 2015-02-02 | Discharge: 2015-02-06 | DRG: 637 | Disposition: A | Discharge status: Hospice | Payer: Medicare Other | Attending: Internal Medicine | Admitting: Internal Medicine

## 2015-02-02 ENCOUNTER — Non-Acute Institutional Stay (SKILLED_NURSING_FACILITY): Payer: Medicare Other | Admitting: Internal Medicine

## 2015-02-02 DIAGNOSIS — S72002A Fracture of unspecified part of neck of left femur, initial encounter for closed fracture: Secondary | ICD-10-CM | POA: Diagnosis present

## 2015-02-02 DIAGNOSIS — E11649 Type 2 diabetes mellitus with hypoglycemia without coma: Secondary | ICD-10-CM | POA: Diagnosis not present

## 2015-02-02 DIAGNOSIS — E162 Hypoglycemia, unspecified: Secondary | ICD-10-CM | POA: Diagnosis not present

## 2015-02-02 DIAGNOSIS — R402431 Glasgow coma scale score 3-8, in the field [EMT or ambulance]: Secondary | ICD-10-CM | POA: Diagnosis not present

## 2015-02-02 DIAGNOSIS — E785 Hyperlipidemia, unspecified: Secondary | ICD-10-CM | POA: Diagnosis present

## 2015-02-02 DIAGNOSIS — Z7189 Other specified counseling: Secondary | ICD-10-CM | POA: Insufficient documentation

## 2015-02-02 DIAGNOSIS — T17998A Other foreign object in respiratory tract, part unspecified causing other injury, initial encounter: Secondary | ICD-10-CM | POA: Diagnosis not present

## 2015-02-02 DIAGNOSIS — T17908A Unspecified foreign body in respiratory tract, part unspecified causing other injury, initial encounter: Secondary | ICD-10-CM

## 2015-02-02 DIAGNOSIS — I10 Essential (primary) hypertension: Secondary | ICD-10-CM | POA: Diagnosis present

## 2015-02-02 DIAGNOSIS — Z9189 Other specified personal risk factors, not elsewhere classified: Secondary | ICD-10-CM

## 2015-02-02 DIAGNOSIS — I2510 Atherosclerotic heart disease of native coronary artery without angina pectoris: Secondary | ICD-10-CM | POA: Diagnosis present

## 2015-02-02 DIAGNOSIS — Z515 Encounter for palliative care: Secondary | ICD-10-CM | POA: Insufficient documentation

## 2015-02-02 DIAGNOSIS — I639 Cerebral infarction, unspecified: Secondary | ICD-10-CM | POA: Insufficient documentation

## 2015-02-02 DIAGNOSIS — G371 Central demyelination of corpus callosum: Secondary | ICD-10-CM | POA: Insufficient documentation

## 2015-02-02 DIAGNOSIS — G9340 Encephalopathy, unspecified: Secondary | ICD-10-CM

## 2015-02-02 DIAGNOSIS — L8990 Pressure ulcer of unspecified site, unspecified stage: Secondary | ICD-10-CM | POA: Insufficient documentation

## 2015-02-02 DIAGNOSIS — S72002D Fracture of unspecified part of neck of left femur, subsequent encounter for closed fracture with routine healing: Secondary | ICD-10-CM

## 2015-02-02 DIAGNOSIS — F329 Major depressive disorder, single episode, unspecified: Secondary | ICD-10-CM

## 2015-02-02 DIAGNOSIS — F32A Depression, unspecified: Secondary | ICD-10-CM | POA: Diagnosis present

## 2015-02-02 DIAGNOSIS — F0390 Unspecified dementia without behavioral disturbance: Secondary | ICD-10-CM | POA: Diagnosis present

## 2015-02-02 LAB — URINALYSIS, ROUTINE W REFLEX MICROSCOPIC
BILIRUBIN URINE: NEGATIVE
Glucose, UA: NEGATIVE mg/dL
HGB URINE DIPSTICK: NEGATIVE
Ketones, ur: NEGATIVE mg/dL
Leukocytes, UA: NEGATIVE
NITRITE: NEGATIVE
Protein, ur: NEGATIVE mg/dL
SPECIFIC GRAVITY, URINE: 1.023 (ref 1.005–1.030)
pH: 5 (ref 5.0–8.0)

## 2015-02-02 LAB — CBC WITH DIFFERENTIAL/PLATELET
BASOS PCT: 0 %
Basophils Absolute: 0 10*3/uL (ref 0.0–0.1)
EOS ABS: 0 10*3/uL (ref 0.0–0.7)
EOS PCT: 0 %
HCT: 30.2 % — ABNORMAL LOW (ref 36.0–46.0)
HEMOGLOBIN: 9.6 g/dL — AB (ref 12.0–15.0)
Lymphocytes Relative: 6 %
Lymphs Abs: 0.6 10*3/uL — ABNORMAL LOW (ref 0.7–4.0)
MCH: 29.3 pg (ref 26.0–34.0)
MCHC: 31.8 g/dL (ref 30.0–36.0)
MCV: 92.1 fL (ref 78.0–100.0)
Monocytes Absolute: 0.6 10*3/uL (ref 0.1–1.0)
Monocytes Relative: 6 %
NEUTROS PCT: 88 %
Neutro Abs: 8 10*3/uL — ABNORMAL HIGH (ref 1.7–7.7)
PLATELETS: 460 10*3/uL — AB (ref 150–400)
RBC: 3.28 MIL/uL — AB (ref 3.87–5.11)
RDW: 13.4 % (ref 11.5–15.5)
WBC: 9.2 10*3/uL (ref 4.0–10.5)

## 2015-02-02 LAB — I-STAT ARTERIAL BLOOD GAS, ED
Acid-Base Excess: 3 mmol/L — ABNORMAL HIGH (ref 0.0–2.0)
Bicarbonate: 24.9 mEq/L — ABNORMAL HIGH (ref 20.0–24.0)
O2 SAT: 97 %
TCO2: 26 mmol/L (ref 0–100)
pCO2 arterial: 31.8 mmHg — ABNORMAL LOW (ref 35.0–45.0)
pH, Arterial: 7.503 — ABNORMAL HIGH (ref 7.350–7.450)
pO2, Arterial: 84 mmHg (ref 80.0–100.0)

## 2015-02-02 LAB — COMPREHENSIVE METABOLIC PANEL
ALBUMIN: 2.1 g/dL — AB (ref 3.5–5.0)
ALK PHOS: 83 U/L (ref 38–126)
ALT: 10 U/L — ABNORMAL LOW (ref 14–54)
ANION GAP: 11 (ref 5–15)
AST: 30 U/L (ref 15–41)
BILIRUBIN TOTAL: 0.4 mg/dL (ref 0.3–1.2)
BUN: 24 mg/dL — ABNORMAL HIGH (ref 6–20)
CALCIUM: 9.3 mg/dL (ref 8.9–10.3)
CO2: 22 mmol/L (ref 22–32)
Chloride: 103 mmol/L (ref 101–111)
Creatinine, Ser: 1.28 mg/dL — ABNORMAL HIGH (ref 0.44–1.00)
GFR, EST AFRICAN AMERICAN: 43 mL/min — AB (ref >60)
GFR, EST NON AFRICAN AMERICAN: 37 mL/min — AB (ref >60)
Glucose, Bld: 147 mg/dL — ABNORMAL HIGH (ref 65–99)
POTASSIUM: 5.1 mmol/L (ref 3.5–5.1)
Sodium: 136 mmol/L (ref 135–145)
TOTAL PROTEIN: 6.8 g/dL (ref 6.5–8.1)

## 2015-02-02 LAB — PROTIME-INR
INR: 1.16 (ref 0.00–1.49)
PROTHROMBIN TIME: 15 s (ref 11.6–15.2)

## 2015-02-02 LAB — GLUCOSE, CAPILLARY
GLUCOSE-CAPILLARY: 78 mg/dL (ref 65–99)
Glucose-Capillary: 83 mg/dL (ref 65–99)

## 2015-02-02 LAB — CBG MONITORING, ED
GLUCOSE-CAPILLARY: 81 mg/dL (ref 65–99)
Glucose-Capillary: 126 mg/dL — ABNORMAL HIGH (ref 65–99)
Glucose-Capillary: 76 mg/dL (ref 65–99)

## 2015-02-02 LAB — TROPONIN I

## 2015-02-02 LAB — BRAIN NATRIURETIC PEPTIDE: B NATRIURETIC PEPTIDE 5: 235.2 pg/mL — AB (ref 0.0–100.0)

## 2015-02-02 MED ORDER — INSULIN ASPART 100 UNIT/ML ~~LOC~~ SOLN: 0.0000 [IU] | Freq: Four times a day (QID) | SUBCUTANEOUS | Status: DC

## 2015-02-02 MED ORDER — PANTOPRAZOLE SODIUM 40 MG IV SOLR
40.0000 mg | INTRAVENOUS | Status: DC
  Administered 2015-02-03 – 2015-02-04 (×2): 40 mg via INTRAVENOUS
  Filled 2015-02-02 (×3): qty 40

## 2015-02-02 MED ORDER — ASPIRIN 325 MG PO TABS
325.0000 mg | ORAL_TABLET | Freq: Every day | ORAL | Status: DC
  Filled 2015-02-02 (×4): qty 1

## 2015-02-02 MED ORDER — ASPIRIN 300 MG RE SUPP
300.0000 mg | Freq: Every day | RECTAL | Status: DC
  Administered 2015-02-03 – 2015-02-05 (×3): 300 mg via RECTAL
  Filled 2015-02-02 (×6): qty 1

## 2015-02-02 MED ORDER — HEPARIN SODIUM (PORCINE) 5000 UNIT/ML IJ SOLN
5000.0000 [IU] | Freq: Three times a day (TID) | INTRAMUSCULAR | Status: DC
  Administered 2015-02-03 – 2015-02-05 (×8): 5000 [IU] via SUBCUTANEOUS
  Filled 2015-02-02 (×11): qty 1

## 2015-02-02 MED ORDER — DEXTROSE 5 % IV SOLN
INTRAVENOUS | Status: DC
  Administered 2015-02-02 – 2015-02-03 (×3): via INTRAVENOUS

## 2015-02-02 MED ORDER — ACETAMINOPHEN 650 MG RE SUPP
650.0000 mg | RECTAL | Status: DC | PRN
  Administered 2015-02-03: 650 mg via RECTAL
  Filled 2015-02-02: qty 1

## 2015-02-02 MED ORDER — ACETAMINOPHEN 325 MG PO TABS: 650.0000 mg | ORAL_TABLET | ORAL | Status: DC | PRN

## 2015-02-02 MED ORDER — STROKE: EARLY STAGES OF RECOVERY BOOK
Freq: Once | Status: AC
  Administered 2015-02-04: 04:00:00
  Filled 2015-02-02 (×2): qty 1

## 2015-02-02 NOTE — ED Notes (Signed)
Patient transported to X-ray 

## 2015-02-02 NOTE — ED Provider Notes (Signed)
CSN: [id number]     Arrival date & time 02/02/15  1524 History   First MD Initiated Contact with Patient 02/02/15 1528     Chief Complaint  Patient presents with  . Hypoglycemia     (Consider location/radiation/quality/duration/timing/severity/associated sxs/prior Treatment) HPI  Patient is an 81 year old female with past medical history significant for CAD (CABG 2000), HTN, DM, recent left hip replacement (01/28/15), who presents to the emergency department with altered mental status. History obtained via EMS report and from daughters. Patient is at a rehabilitation facility following left hip replacement. Per report patient was found minimally minimally responsive this morning. EMS was called this afternoon. When they arrived glucose 36, given D50, repeat glu 136 without improvement of responsiveness. Patient's last known normal at 7 PM last night. Daughter reports that last night patient was at her baseline since the surgery, able to respond appropriately to questions and follow commands. She worked with physical therapy yesterday, no known falls or head injury.   Past Medical History  Diagnosis Date  . Shortness of breath   . CAD (coronary artery disease)     Last catheterization 2009, grafts patent, EF 70%, 80% small OM with possible mild ischemic  . Hx of CABG     2000, LIMA to LAD, SVG to diagonal, SVG to OM, SVG to posterior descending  . Ejection fraction     EF 70%,, 2009 /  EF 65%, echo, October, 2011  . Hypertension   . Palpitations   . Dyslipidemia   . Diabetes mellitus   . Pancreatitis   . Carotid artery disease (HCC)     Mild, Doppler, 2000  . Dizziness     Evaluated April, 2008, no treatment needed  . Cervical spine disease     Severe neck pain, 2011  . Hyperkalemia     Her doctor Zachery Dauer, September, 2012, ACE inhibitor stopped, probable amlodipine to be  . Shortness of breath   . History of hysterectomy   . Chest pain     Chest hurting, May, 2013   Past  Surgical History  Procedure Laterality Date  . Coronary artery bypass graft  2000  . Cardiac catheterization  2009  . Cholecystectomy    . Anterior cervical decomp/discectomy fusion  11/30/2011    Procedure: ANTERIOR CERVICAL DECOMPRESSION/DISCECTOMY FUSION 1 LEVEL;  Surgeon: Tia Alert, MD;  Location: [facility];  Service: Neurosurgery;  Laterality: N/A;  Anterior Cervical Decompression Discectomy Cervical Five-Six  . Left heart catheterization with coronary/graft angiogram N/A 05/29/2013    Procedure: LEFT HEART CATHETERIZATION WITH Isabel Caprice;  Surgeon: Kathleene Hazel, MD;  Location: [facility] CATH LAB;  Service: Cardiovascular;  Laterality: N/A;  . Hip arthroplasty Left 01/28/2015    Procedure: ARTHROPLASTY BIPOLAR HIP (HEMIARTHROPLASTY);  Surgeon: Samson Frederic, MD;  Location: [facility] OR;  Service: Orthopedics;  Laterality: Left;   Family History  Problem Relation Age of Onset  . Cancer Mother   . Dementia Mother   . Heart failure Father   . Coronary artery disease Father   . Cancer      siblings  . Heart failure      siblings  . Dementia Brother   . Parkinsonism Brother    Social History  Substance Use Topics  . Smoking status: Never Smoker   . Smokeless tobacco: None     Comment: quit in the 1970's  . Alcohol Use: No   OB History    No data available     Review of Systems  Unable to perform ROS: Mental status change  Constitutional: Negative for fever.  Respiratory: Negative for shortness of breath.   Gastrointestinal: Negative for nausea, vomiting and blood in stool.  Skin: Negative for wound.  Neurological: Negative for seizures.      Allergies  Codeine  Home Medications   Prior to Admission medications   Medication Sig Start Date End Date Taking? Authorizing Provider  acetaminophen (TYLENOL) 500 MG tablet Take 2 tablets (1,000 mg total) by mouth every 8 (eight) hours. 01/30/15  Yes Shanker Levora Dredge, MD  albuterol (PROVENTIL) (2.5 MG/3ML)  0.083% nebulizer solution Take 3 mLs (2.5 mg total) by nebulization every 2 (two) hours as needed for shortness of breath. 01/30/15  Yes Shanker Levora Dredge, MD  amLODipine (NORVASC) 10 MG tablet Take 1 tablet (10 mg total) by mouth daily. 09/15/14  Yes Luis Abed, MD  aspirin EC 325 MG tablet Take 1 tablet (325 mg total) by mouth daily with breakfast. 01/29/15  Yes Samson Frederic, MD  atorvastatin (LIPITOR) 20 MG tablet Take 1 tablet (20 mg total) by mouth daily. Patient taking differently: Take 20 mg by mouth daily at 6 PM.  03/14/12  Yes Luis Abed, MD  buPROPion [facility] SR) 100 MG 12 hr tablet Take 100 mg by mouth 2 (two) times daily.   Yes Historical Provider, MD  clopidogrel (PLAVIX) 75 MG tablet Take 1 tablet (75 mg total) by mouth daily. Continue on hold for 2 more weeks. Patient taking differently: Take 75 mg by mouth daily.  12/05/11  Yes Vassie Loll, MD  DULoxetine (CYMBALTA) 60 MG capsule Take 60 mg by mouth daily.   Yes Historical Provider, MD  feeding supplement, ENSURE ENLIVE, (ENSURE ENLIVE) LIQD Take 237 mLs by mouth 3 (three) times daily between meals. 01/30/15  Yes Shanker Levora Dredge, MD  furosemide (LASIX) 20 MG tablet Take 20 mg by mouth as needed for fluid (PATIENT TAKES ONE TABLET BY MOUTH AS NEEDED FOR FLUID RETENSION).  08/25/13  Yes Historical Provider, MD  gabapentin (NEURONTIN) 100 MG capsule Take 200 mg by mouth 2 (two) times daily.   Yes Historical Provider, MD  glimepiride (AMARYL) 2 MG tablet Take 4 mg by mouth daily with breakfast.    Yes Historical Provider, MD  LORazepam (ATIVAN) 0.5 MG tablet Take 1 tablet (0.5 mg total) by mouth 2 (two) times daily. 01/30/15  Yes Shanker Levora Dredge, MD  memantine (NAMENDA) 10 MG tablet Take 1 tablet (10 mg total) by mouth 2 (two) times daily. 01/30/15  Yes Shanker Levora Dredge, MD  metFORMIN (GLUCOPHAGE-XR) 500 MG 24 hr tablet Take 500-1,000 mg by mouth daily with breakfast. 1000 mg in morning and 500 at  Night   Yes Historical  Provider, MD  nitroGLYCERIN (NITROSTAT) 0.4 MG SL tablet Place 1 tablet (0.4 mg total) under the tongue every 5 (five) minutes as needed. For chest pain 04/18/13  Yes Luis Abed, MD  oxyCODONE (OXY IR/ROXICODONE) 5 MG immediate release tablet Take 1 tablet (5 mg total) by mouth every 4 (four) hours as needed for moderate pain. 02/01/15  Yes Tiffany L Reed, DO  pantoprazole (PROTONIX) 40 MG tablet Take 1 tablet (40 mg total) by mouth daily. 09/15/14  Yes Luis Abed, MD  polyethylene glycol [facility] Ethe[facility] Hal) packet Take 17 g by mouth daily as needed. Patient taking differently: Take 17 g by mouth daily as needed for mild constipation or moderate constipation.  01/30/15  Yes Shanker Levora Dredge, MD  senna-docusate (SENOKOT-S)  8.6-50 MG tablet Take 1 tablet by mouth at bedtime. 01/30/15  Yes Shanker Levora DredgeM Ghimire, MD  sitaGLIPtin (JANUVIA) 100 MG tablet Take 100 mg by mouth daily.     Yes Historical Provider, MD  buPROPion (WELLBUTRIN) 100 MG tablet Take 1 tablet (100 mg total) by mouth 2 (two) times daily. Patient not taking: Reported on 02/02/2015 01/08/15   Anson FretAntonia B Ahern, MD   BP 126/56 mmHg  Pulse 89  Temp(Src) 98.6 F (37 C) (Oral)  Resp 22  Ht 5\' 7"  (1.702 m)  Wt 67.813 kg  BMI 23.41 kg/m2  SpO2 96% Physical Exam  Constitutional: She appears well-developed and well-nourished. No distress.  HENT:  Head: Atraumatic.  Mouth/Throat: Oropharynx is clear and moist.  Eyes: Conjunctivae and EOM are normal.  Neck: Normal range of motion. No JVD present. No tracheal deviation present.  Cardiovascular: Normal rate, regular rhythm, normal heart sounds and intact distal pulses.   Pulmonary/Chest: Effort normal and breath sounds normal. No respiratory distress. She has no wheezes. She exhibits no tenderness.  Abdominal: Soft. Bowel sounds are normal. She exhibits no distension. There is no tenderness. There is no rebound and no guarding.  Neurological: She has normal reflexes. She displays no  seizure activity. GCS eye subscore is 4. GCS verbal subscore is 2. GCS motor subscore is 4. She displays no Babinski's sign on the right side. She displays no Babinski's sign on the left side.  Patient minimally responsive, alert, does not follow commands, no verbal response. Pupils 2 mm equal round and reactive. No obvious facial asymmetry. Holding bilateral upper extremities in flexion, does not withdraw to noxious stimuli. Withdraws to noxious stimuli of bilateral lower extremities.  Skin: Skin is warm. No rash noted.  Psychiatric: She has a normal mood and affect.    ED Course  Procedures (including critical care time) Labs Review Labs Reviewed  COMPREHENSIVE METABOLIC PANEL - Abnormal; Notable for the following:    Glucose, Bld 147 (*)    BUN 24 (*)    Creatinine, Ser 1.28 (*)    Albumin 2.1 (*)    ALT 10 (*)    GFR calc non Af Amer 37 (*)    GFR calc Af Amer 43 (*)    All other components within normal limits  BRAIN NATRIURETIC PEPTIDE - Abnormal; Notable for the following:    B Natriuretic Peptide 235.2 (*)    All other components within normal limits  CBC WITH DIFFERENTIAL/PLATELET - Abnormal; Notable for the following:    RBC 3.28 (*)    Hemoglobin 9.6 (*)    HCT 30.2 (*)    Platelets 460 (*)    Neutro Abs 8.0 (*)    Lymphs Abs 0.6 (*)    All other components within normal limits  CBG MONITORING, ED - Abnormal; Notable for the following:    Glucose-Capillary 126 (*)    All other components within normal limits  I-STAT ARTERIAL BLOOD GAS, ED - Abnormal; Notable for the following:    pH, Arterial 7.503 (*)    pCO2 arterial 31.8 (*)    Bicarbonate 24.9 (*)    Acid-Base Excess 3.0 (*)    All other components within normal limits  MRSA PCR SCREENING  TROPONIN I  URINALYSIS, ROUTINE W REFLEX MICROSCOPIC (NOT AT [facility])  PROTIME-INR  GLUCOSE, CAPILLARY  GLUCOSE, CAPILLARY  GLUCOSE, CAPILLARY  HEMOGLOBIN A1C  LIPID PANEL  AMMONIA  CBG MONITORING, ED  CBG MONITORING,  ED    Imaging Review Dg Chest 2  View  02/02/2015  CLINICAL DATA:  Altered mental status. EXAM: CHEST  2 VIEW COMPARISON:  01/26/2015 FINDINGS: Prior CABG. Heart and mediastinal contours are within normal limits. No focal opacities or effusions. No acute bony abnormality. IMPRESSION: No active cardiopulmonary disease. Electronically Signed   By: Charlett Nose M.D.   On: 02/02/2015 16:30   Ct Head Wo Contrast  02/02/2015  CLINICAL DATA:  Patient with recent hip replacement. Decreased level of consciousness. EXAM: CT HEAD WITHOUT CONTRAST TECHNIQUE: Contiguous axial images were obtained from the base of the skull through the vertex without intravenous contrast. COMPARISON:  Brain CT 11/17/2011; MR brain 09/18/2014. FINDINGS: Ventricles and sulci are appropriate for patient's age. Periventricular and subcortical white matter hypodensity compatible with chronic small vessel ischemic changes. No evidence for acute cortically based infarct, intracranial hemorrhage, mass lesion or mass effect. Orbits are unremarkable. Paranasal sinuses are well aerated. Calvarium is intact. IMPRESSION: No acute intracranial process. Chronic small vessel ischemic changes. Electronically Signed   By: Annia Belt M.D.   On: 02/02/2015 16:44   Mr Brain Wo Contrast  02/02/2015  CLINICAL DATA:  Acute encephalopathy.  Hypoglycemia EXAM: MRI HEAD WITHOUT CONTRAST TECHNIQUE: Multiplanar, multiecho pulse sequences of the brain and surrounding structures were obtained without intravenous contrast. COMPARISON:  MRI 09/18/2014 FINDINGS: Multiple small areas of restricted diffusion compatible with acute infarct. These all measure under 1 cm in are present in the right frontal lobe, left parietal lobe, and left cerebellum. The cerebral white matter and internal capsule bilaterally is diffusely hyperintense on diffusion-weighted imaging. This shows low signal on ADC map. This not felt to represent acute infarct but could be related to is  leukoencephalopathy secondary to hypoglycemia. Diffusion-weighted imaging was normal on the prior study. On today's study, the diffusion-weighted abnormality in the white matter is much more extensive than that seen on T2 or FLAIR Mild atrophy. Mild chronic microvascular ischemic change in the white matter and pons. Negative for hemorrhage or mass lesion. No shift of the midline structures. IMPRESSION: Multiple small focal areas of restricted diffusion in both cerebral hemispheres and the left cerebellum compatible with acute infarct. Atrophy and mild chronic microvascular ischemia. Patchy diffuse diffusion hyperintensity throughout the parietal white matter bilaterally is likely related to the patient's hypoglycemia. This may be reversible. Follow-up MRI suggested. Electronically Signed   By: Marlan Palau M.D.   On: 02/02/2015 20:40   I have personally reviewed and evaluated these images and lab results as part of my medical decision-making.   EKG Interpretation None      MDM   Final diagnoses:  Acute encephalopathy   The patient is an 81 year old female with recent left hip total replacement, currently at rehabilitation facility, who presents to the emergency department with altered mental status. Last known normal 7 PM. Patient found this morning hypoglycemic in the nursing home. Did not call EMS until later that afternoon. On arrival patient is minimally responsive, protecting her airway. Afebrile, hemodynamically stable. Exam of the above, notable for lungs CTAB, benign abdominal exam, intact distal pulses. Unable to participate in full neurologic exam, minimally withdrawing to painful stimuli in all extremities.  Ddx includes ACS, metabolic encephalopathy, intracranial process (malignancy, hemorrhage, infarction, mass), sepsis, hypoglycemia, seizures, trauma, intoxication.   Glucose 36 6 upon EMS arrival, given D50, improvement of glucose to 130s without improvement of symptoms.  Doubt  hypoglycemia as her cause for altered mental status. No leukocytosis. No significant electrolyte abnormalities. Hemoglobin at baseline. Troponin 0.03. Chest x-ray showed no signs  of infection. UA showed no signs of UTI. CT head showed no acute findings.   On re-evaluation, patient continues to protect her airway, no need for intubation at this time.   Patient with acute encephalopathy without obvious etiology. Patient will be admitted to ICU for further workup of her encephalopathy. Patient transferred to ICU in stable condition.    Corena Herter, MD 02/03/15 [id number]  Nelva Nay, MD 02/03/15 [phone]

## 2015-02-02 NOTE — ED Notes (Signed)
Pt returned from MRI °

## 2015-02-02 NOTE — ED Notes (Signed)
Patient comes from [facility] due to recent lip hip replacement. Patient was Alert  Yesterday, per EMS. Patient LOC decreased throughout the night. Patient CBG with EMS 36 . Patient given D50. Repeat  CBG 132 with no change in LOC. Patient no change in LOC. Patient  Min response to pain in LRE.

## 2015-02-02 NOTE — Progress Notes (Signed)
Patient ID: [name], female   DOB: 05-07-1928, 81 y.o.   MRN: [id number]     Facility: [facility]    PCP: Gaye Alken, MD  Code Status: DNR  Allergies  Allergen Reactions  . Codeine Hives    Chief Complaint  Patient presents with  . New Admit To SNF     HPI:  81 y.o. patient is here for short term rehabilitation post hospital admission from 01/26/15-01/30/15 after a mechanical fall with left femoral neck fracture. She underwent left hip hemiarthroplasty on 01/28/15. She had post op blood loss anemia but did not require blood transfusion.She has PMH of CAD s/p CABG, AS, HTN, DM type 2 and dementia. She is seen in her room today. She is arousable minimally to pain stimuli but is otherwise not responding. Unable to obtain any HPI or ROS. Per staff, she had worked with therapy yesterday afternoon and then was somewhat sleepy in the evning. She has received her ativan, depression medication and her oral hypoglycemic last night. She has not been up to eat her breakfast or lunch this am. At present she is snoring.  Review of Systems:  Unable to obtain    Past Medical History  Diagnosis Date  . Shortness of breath   . CAD (coronary artery disease)     Last catheterization 2009, grafts patent, EF 70%, 80% small OM with possible mild ischemic  . Hx of CABG     2000, LIMA to LAD, SVG to diagonal, SVG to OM, SVG to posterior descending  . Ejection fraction     EF 70%,, 2009 /  EF 65%, echo, October, 2011  . Hypertension   . Palpitations   . Dyslipidemia   . Diabetes mellitus   . Pancreatitis   . Carotid artery disease (HCC)     Mild, Doppler, 2000  . Dizziness     Evaluated April, 2008, no treatment needed  . Cervical spine disease     Severe neck pain, 2011  . Hyperkalemia     Her doctor Zachery Dauer, September, 2012, ACE inhibitor stopped, probable amlodipine to be  . Shortness of breath   . History of hysterectomy   . Chest pain    Chest hurting, May, 2013   Past Surgical History  Procedure Laterality Date  . Coronary artery bypass graft  2000  . Cardiac catheterization  2009  . Cholecystectomy    . Anterior cervical decomp/discectomy fusion  11/30/2011    Procedure: ANTERIOR CERVICAL DECOMPRESSION/DISCECTOMY FUSION 1 LEVEL;  Surgeon: Tia Alert, MD;  Location: [facility];  Service: Neurosurgery;  Laterality: N/A;  Anterior Cervical Decompression Discectomy Cervical Five-Six  . Left heart catheterization with coronary/graft angiogram N/A 05/29/2013    Procedure: LEFT HEART CATHETERIZATION WITH Isabel Caprice;  Surgeon: Kathleene Hazel, MD;  Location: [facility] CATH LAB;  Service: Cardiovascular;  Laterality: N/A;  . Hip arthroplasty Left 01/28/2015    Procedure: ARTHROPLASTY BIPOLAR HIP (HEMIARTHROPLASTY);  Surgeon: Samson Frederic, MD;  Location: [facility] OR;  Service: Orthopedics;  Laterality: Left;   Social History:   reports that she has never smoked. She does not have any smokeless tobacco history on file. She reports that she does not drink alcohol or use illicit drugs.  Family History  Problem Relation Age of Onset  . Cancer Mother   . Dementia Mother   . Heart failure Father   . Coronary artery disease Father   . Cancer      siblings  . Heart  failure      siblings  . Dementia Brother   . Parkinsonism Brother     Medications:   Medication List       This list is accurate as of: 02/02/15  8:12 AM.  Always use your most recent med list.               acetaminophen 500 MG tablet  Commonly known as:  TYLENOL  Take 2 tablets (1,000 mg total) by mouth every 8 (eight) hours.     albuterol (2.5 MG/3ML) 0.083% nebulizer solution  Commonly known as:  PROVENTIL  Take 3 mLs (2.5 mg total) by nebulization every 2 (two) hours as needed for shortness of breath.     amLODipine 10 MG tablet  Commonly known as:  NORVASC  Take 1 tablet (10 mg total) by mouth daily.     aspirin EC 325 MG tablet    Take 1 tablet (325 mg total) by mouth daily with breakfast.     atorvastatin 20 MG tablet  Commonly known as:  LIPITOR  Take 1 tablet (20 mg total) by mouth daily.     buPROPion 100 MG tablet  Commonly known as:  WELLBUTRIN  Take 1 tablet (100 mg total) by mouth 2 (two) times daily.     clopidogrel 75 MG tablet  Commonly known as:  PLAVIX  Take 1 tablet (75 mg total) by mouth daily. Continue on hold for 2 more weeks.     DULoxetine 60 MG capsule  Commonly known as:  CYMBALTA  Take 60 mg by mouth daily.     feeding supplement (ENSURE ENLIVE) Liqd  Take 237 mLs by mouth 3 (three) times daily between meals.     furosemide 20 MG tablet  Commonly known as:  LASIX  Take 20 mg by mouth as needed for fluid (PATIENT TAKES ONE TABLET BY MOUTH AS NEEDED FOR FLUID RETENSION).     gabapentin 100 MG capsule  Commonly known as:  NEURONTIN  Take 200 mg by mouth 2 (two) times daily.     glimepiride 2 MG tablet  Commonly known as:  AMARYL  Take 4 mg by mouth daily with breakfast.     LORazepam 0.5 MG tablet  Commonly known as:  ATIVAN  Take 1 tablet (0.5 mg total) by mouth 2 (two) times daily.     memantine 10 MG tablet  Commonly known as:  NAMENDA  Take 1 tablet (10 mg total) by mouth 2 (two) times daily.     metFORMIN 500 MG 24 hr tablet  Commonly known as:  GLUCOPHAGE-XR  Take 500-1,000 mg by mouth daily with breakfast. 1000 mg in morning and 500 at  Night     nitroGLYCERIN 0.4 MG SL tablet  Commonly known as:  NITROSTAT  Place 1 tablet (0.4 mg total) under the tongue every 5 (five) minutes as needed. For chest pain     oxyCODONE 5 MG immediate release tablet  Commonly known as:  Oxy IR/ROXICODONE  Take 1 tablet (5 mg total) by mouth every 4 (four) hours as needed for moderate pain.     pantoprazole 40 MG tablet  Commonly known as:  PROTONIX  Take 1 tablet (40 mg total) by mouth daily.     polyethylene glycol packet  Commonly known as:  MIRALAX / GLYCOLAX  Take 17 g by  mouth daily as needed.     senna-docusate 8.6-50 MG tablet  Commonly known as:  Senokot-S  Take 1 tablet by mouth  at bedtime.     sitaGLIPtin 100 MG tablet  Commonly known as:  JANUVIA  Take 100 mg by mouth daily.         Physical Exam: Filed Vitals:   02/02/15 0810  BP: 136/64  Pulse: 80  Temp: 97.4 F (36.3 C)  Resp: 18  SpO2: 96%   Fingerstick cbg 54  General- elderly female, thin built, unresponsive, gurgling at back of her throat Head- normocephalic, atraumatic Nose- normal nasal mucosa, no maxillary or frontal sinus tenderness, no nasal discharge Throat- dry mucus membrane Eyes- PERRLA, no pallor, no icterus, no discharge, normal conjunctiva, normal sclera Neck- no cervical lymphadenopathy, no JVD Cardiovascular- normal s1,s2, systolic murmur +, trace leg edema Respiratory- bilateral rhonchi present, no crackles, no wheeze, no use of accessory muscles clear to auscultation, no wheeze, no rhonchi, no crackles, no use of accessory muscles Abdomen- bowel sounds present, soft, non tender Musculoskeletal- unable to assess with her not following commands  Neurological- responsive to painful stimuli by withdrawing her legs, not opening her eyes and non verbal with GCS 6 Skin- warm and dry, left thigh aquacel dressing in place   Labs reviewed: Basic Metabolic Panel:  Recent Labs  16/10/96 1611  01/27/15 0732 01/28/15 0640 01/29/15 0412 01/30/15 0300  NA  --   < > 141 137 137 134*  K  --   < > 2.5* 3.4* 4.5 4.9  CL  --   < > 106 106 107 104  CO2  --   < > 23 22 23 24   GLUCOSE  --   < > 65 111* 95 199*  BUN  --   < > 10 15 15  23*  CREATININE  --   < > 1.30* 1.19* 1.21* 1.27*  CALCIUM  --   < > 8.8* 8.6* 8.2* 8.6*  MG 1.4*  --  1.1* 1.6*  --   --   < > = values in this interval not displayed. Liver Function Tests:  Recent Labs  01/26/15 1640 01/26/15 2055  AST 32  --   ALT 14  --   ALKPHOS 76  --   BILITOT 0.5  --   PROT 8.0  --   ALBUMIN 3.3* 2.9*    No results for input(s): LIPASE, AMYLASE in the last 8760 hours. No results for input(s): AMMONIA in the last 8760 hours. CBC:  Recent Labs  01/26/15 1640  01/28/15 0640 01/29/15 0412 01/30/15 0300  WBC 6.8  < > 9.3 8.0 7.2  NEUTROABS 4.1  --   --   --   --   HGB 12.1  < > 9.6* 8.7* 9.1*  HCT 37.2  < > 30.3* 27.8* 27.9*  MCV 91.4  < > 92.7 93.3 93.0  PLT 285  < > 237 235 253  < > = values in this interval not displayed. Cardiac Enzymes: No results for input(s): CKTOTAL, CKMB, CKMBINDEX, TROPONINI in the last 8760 hours. BNP: Invalid input(s): POCBNP CBG:  Recent Labs  01/30/15 1156 01/30/15 1627 01/30/15 1803  GLUCAP 225* 182* 197*    Radiological Exams: Dg Knee 1-2 Views Left  01/26/2015  CLINICAL DATA:  Acute onset of left hip pain after fall. Initial encounter. EXAM: LEFT KNEE - 1-2 VIEW COMPARISON:  None. FINDINGS: No acute fracture is seen at the knee. The distal femur appears intact. A small knee joint effusion is noted. There is suggestion of mild lateral compartment narrowing. No definite soft tissue abnormalities are otherwise characterized. IMPRESSION: 1. No  evidence of fracture or dislocation. 2. Small knee joint effusion noted. 3. Suggestion of mild lateral compartment narrowing. Electronically Signed   By: Roanna RaiderJeffery  Chang M.D.   On: 01/26/2015 18:52   Ct Pelvis Wo Contrast  01/26/2015  CLINICAL DATA:  Left hip and pelvic pain, after collapsing when standing up out of bed. Question for pubic symphysis fracture on radiograph. Further evaluation requested. Initial encounter. EXAM: CT PELVIS WITHOUT CONTRAST TECHNIQUE: Multidetector CT imaging of the pelvis was performed following the standard protocol without intravenous contrast. COMPARISON:  Left hip radiographs performed earlier today at 2:59 p.m. FINDINGS: There is a minimally displaced subcapital fracture through the left femoral neck, with slight comminution. No additional fracture is seen. The pubic symphysis  demonstrates mild degenerative change but is otherwise unremarkable in appearance. The right hip joint appears intact. Degenerative change is noted at the lower lumbar spine, with vacuum phenomenon at L4-L5 and L5-S1. The sacroiliac joints are grossly unremarkable, aside from mild sclerotic change at the left sacroiliac joint. Scattered vascular calcifications are seen. Diffuse diverticulosis is noted along the distal descending and sigmoid colon. A clip is noted at the right inguinal region. No definite significant soft tissue injury is characterized. No focal soft tissue hematoma is seen. IMPRESSION: 1. Minimally displaced subcapital fracture through the left femoral neck, with slight comminution. No additional fracture seen. 2. Degenerative change at the lower lumbar spine. 3. Scattered vascular calcifications seen. 4. Diffuse diverticulosis along the distal descending and sigmoid colon. Electronically Signed   By: Roanna RaiderJeffery  Chang M.D.   On: 01/26/2015 18:19   Dg Chest Port 1 View  01/26/2015  CLINICAL DATA:  Preop.  Left femoral neck fracture. EXAM: PORTABLE CHEST 1 VIEW COMPARISON:  07/03/2013 FINDINGS: Sequelae of prior CABG are again identified. The patient has taken a shallower inspiration than on the prior study and there is bronchovascular crowding with mild left basilar atelectasis. No pleural effusion or pneumothorax is identified. Surgical clips are noted in the region of the GE junction. IMPRESSION: Shallow inspiration with left basilar atelectasis. Electronically Signed   By: Sebastian AcheAllen  Grady M.D.   On: 01/26/2015 16:34   Dg Hip Unilat With Pelvis 2-3 Views Left  01/26/2015  CLINICAL DATA:  Fall at home one week ago with left hip pain. EXAM: DG HIP (WITH OR WITHOUT PELVIS) 2-3V LEFT COMPARISON:  None. FINDINGS: Examination demonstrates a minimally displaced subcapital to transcervical fracture of the left femoral neck could not exclude a nondisplaced fracture of the symphysis bilaterally. There are  mild symmetric degenerative changes of the hips. There are degenerative changes of the spine. IMPRESSION: Minimally displaced left femoral neck fracture. Cannot exclude subtle fracture involving the symphysis bilaterally. Electronically Signed   By: Elberta Fortisaniel  Boyle M.D.   On: 01/26/2015 15:27    Assessment/Plan  AMS GCS of 6 at present. Could be multifactorial with hypoglycemia, being on CNS depressant- ativan, oxycodone, wellbutrin, gabapentin, cymbalta. narcotic overdose is another possibility. Other differential is seizure, brain injury, toxic syndrome. Sending to ED for evaluation  Hypoglycemia Has not had her breakfast and lunch and cbg is 54. Had received metformin and sitagliptin yesterday. This am cbg 188 and at present 54. Give glucagon 1 mg im x 1 now and establish iv line to give her dextrose push. Will send patient to ED for further evaluation and management  Aspiration into airway Get cxr to evaluate this, has gurgling at back of her throat, will need aspiration precautions given her altered mental state  Patient is a  DNR. Spent more than 50 minutes in care-co-ordination for the patient   Oneal Grout, MD  [facility] [phone] (Monday-Friday 8 am - 5 pm) [phone] (afterhours)

## 2015-02-02 NOTE — ED Notes (Signed)
Myself and Harley, EMT undressed patient, placed in gown, on monitor, continuous pulse oximetry and blood pressure cuff; [place], RN and Jesse SansJanee', RN present in room

## 2015-02-02 NOTE — H&P (Addendum)
[facility] History and Physical  Patient: Sara HusbandsShirley [name]  MRN: [id number]  DOB: 11/05/1928  DOS: the patient was seen and examined on 02/02/2015 PCP: Gaye AlkenBARNES,ELIZABETH STEWART, MD  Referring physician: Dr. Radford PaxBeaton Chief Complaint: unresponsive episode  HPI: Sara HusbandsShirley [name] is a 81 y.o. female with Past medical history of recent hip replacement surgery, coronary artery disease status post CABG, essential hypertension, diabetes mellitus, dementia, peripheral vascular disease. The patient presented with complaints of an unresponsive episode. The patient was last seen normal at 7 PM last night. Earlier in the morning the patient was drowsy and lethargic in the nursing home. As the day progresses her condition did not improve. Later on they check her glucose and it was found significantly low. Patient was given IV dextrose and was brought here for further workup. At the time of my evaluation the patient was unable to provide any significant history. At her baseline the patient is able to communicate as well as identify her family members. She is able to feed herself as well. No fall or trauma or injury reported. Patient has been on multiple narcotic as well as psychotropic medications.  The patient is coming from SNF.  At her baseline ambulates with support And is dependent for most of her ADL; does notmanages her medication on her own.  Review of Systems: as mentioned in the history of present illness.  A comprehensive review of the other systems is negative.  Past Medical History  Diagnosis Date  . Shortness of breath   . CAD (coronary artery disease)     Last catheterization 2009, grafts patent, EF 70%, 80% small OM with possible mild ischemic  . Hx of CABG     2000, LIMA to LAD, SVG to diagonal, SVG to OM, SVG to posterior descending  . Ejection fraction     EF 70%,, 2009 /  EF 65%, echo, October, 2011  . Hypertension   . Palpitations   . Dyslipidemia   . Diabetes mellitus     . Pancreatitis   . Carotid artery disease (HCC)     Mild, Doppler, 2000  . Dizziness     Evaluated April, 2008, no treatment needed  . Cervical spine disease     Severe neck pain, 2011  . Hyperkalemia     Her doctor Zachery DauerBarnes, September, 2012, ACE inhibitor stopped, probable amlodipine to be  . Shortness of breath   . History of hysterectomy   . Chest pain     Chest hurting, May, 2013   Past Surgical History  Procedure Laterality Date  . Coronary artery bypass graft  2000  . Cardiac catheterization  2009  . Cholecystectomy    . Anterior cervical decomp/discectomy fusion  11/30/2011    Procedure: ANTERIOR CERVICAL DECOMPRESSION/DISCECTOMY FUSION 1 LEVEL;  Surgeon: Tia Alertavid S Jones, MD;  Location: [facility];  Service: Neurosurgery;  Laterality: N/A;  Anterior Cervical Decompression Discectomy Cervical Five-Six  . Left heart catheterization with coronary/graft angiogram N/A 05/29/2013    Procedure: LEFT HEART CATHETERIZATION WITH Isabel CapriceORONARY/GRAFT ANGIOGRAM;  Surgeon: Kathleene Hazelhristopher D McAlhany, MD;  [facility];  Service: Cardiovascular;  Laterality: N/A;  . Hip arthroplasty Left 01/28/2015    Procedure: ARTHROPLASTY BIPOLAR HIP (HEMIARTHROPLASTY);  Surgeon: Samson FredericBrian Swinteck, MD;  [facility];  Service: Orthopedics;  Laterality: Left;   Social History:  reports that she has never smoked. She does not have any smokeless tobacco history on file. She reports that she does not drink alcohol or use illicit drugs.  Allergies  Allergen Reactions  . Codeine Hives    Family History  Problem Relation Age of Onset  . Cancer Mother   . Dementia Mother   . Heart failure Father   . Coronary artery disease Father   . Cancer      siblings  . Heart failure      siblings  . Dementia Brother   . Parkinsonism Brother     Prior to Admission medications   Medication Sig Start Date End Date Taking? Authorizing Provider  acetaminophen (TYLENOL) 500 MG tablet Take 2 tablets (1,000 mg total)  by mouth every 8 (eight) hours. 01/30/15   Shanker Levora Dredge, MD  albuterol (PROVENTIL) (2.5 MG/3ML) 0.083% nebulizer solution Take 3 mLs (2.5 mg total) by nebulization every 2 (two) hours as needed for shortness of breath. 01/30/15   Shanker Levora Dredge, MD  amLODipine (NORVASC) 10 MG tablet Take 1 tablet (10 mg total) by mouth daily. 09/15/14   Luis Abed, MD  aspirin EC 325 MG tablet Take 1 tablet (325 mg total) by mouth daily with breakfast. 01/29/15   Samson Frederic, MD  atorvastatin (LIPITOR) 20 MG tablet Take 1 tablet (20 mg total) by mouth daily. 03/14/12   Luis Abed, MD  buPROPion (WELLBUTRIN) 100 MG tablet Take 1 tablet (100 mg total) by mouth 2 (two) times daily. 01/08/15   Anson Fret, MD  clopidogrel (PLAVIX) 75 MG tablet Take 1 tablet (75 mg total) by mouth daily. Continue on hold for 2 more weeks. 12/05/11   Vassie Loll, MD  DULoxetine (CYMBALTA) 60 MG capsule Take 60 mg by mouth daily.    Historical Provider, MD  feeding supplement, ENSURE ENLIVE, (ENSURE ENLIVE) LIQD Take 237 mLs by mouth 3 (three) times daily between meals. 01/30/15   Shanker Levora Dredge, MD  furosemide (LASIX) 20 MG tablet Take 20 mg by mouth as needed for fluid (PATIENT TAKES ONE TABLET BY MOUTH AS NEEDED FOR FLUID RETENSION).  08/25/13   Historical Provider, MD  gabapentin (NEURONTIN) 100 MG capsule Take 200 mg by mouth 2 (two) times daily.    Historical Provider, MD  glimepiride (AMARYL) 2 MG tablet Take 4 mg by mouth daily with breakfast.     Historical Provider, MD  LORazepam (ATIVAN) 0.5 MG tablet Take 1 tablet (0.5 mg total) by mouth 2 (two) times daily. 01/30/15   Shanker Levora Dredge, MD  memantine (NAMENDA) 10 MG tablet Take 1 tablet (10 mg total) by mouth 2 (two) times daily. 01/30/15   Shanker Levora Dredge, MD  metFORMIN (GLUCOPHAGE-XR) 500 MG 24 hr tablet Take 500-1,000 mg by mouth daily with breakfast. 1000 mg in morning and 500 at  Night    Historical Provider, MD  nitroGLYCERIN (NITROSTAT) 0.4 MG SL  tablet Place 1 tablet (0.4 mg total) under the tongue every 5 (five) minutes as needed. For chest pain 04/18/13   Luis Abed, MD  oxyCODONE (OXY IR/ROXICODONE) 5 MG immediate release tablet Take 1 tablet (5 mg total) by mouth every 4 (four) hours as needed for moderate pain. 02/01/15   Tiffany L Reed, DO  pantoprazole (PROTONIX) 40 MG tablet Take 1 tablet (40 mg total) by mouth daily. 09/15/14   Luis Abed, MD  polyethylene glycol [facility] / Ethelene Hal) packet Take 17 g by mouth daily as needed. 01/30/15   Shanker Levora Dredge, MD  senna-docusate (SENOKOT-S) 8.6-50 MG tablet Take 1 tablet by mouth at bedtime. 01/30/15   Shanker Levora Dredge, MD  sitaGLIPtin (JANUVIA) 100  MG tablet Take 100 mg by mouth daily.      Historical Provider, MD    Physical Exam: Filed Vitals:   02/02/15 1600 02/02/15 1645 02/02/15 1700 02/02/15 1715  BP: 106/53 124/60 129/54 128/68  Pulse:    86  Temp:      TempSrc:      Resp: SpO2:    99%    General: responding to painful stimuli, not following command Appear in mild distress Eyes: PERRL ENT: Oral Mucosa clear moist Neck: no JVD Cardiovascular: S1 and S2 Present, no Murmur, Peripheral Pulses Present Respiratory: Bilateral Air entry equal and Decreased,  Clear to Auscultation, no Crackles, no wheezes Abdomen: Bowel Sound present, Soft Skin: no Rash Extremities: no Pedal edema, no calf tenderness Neurologic: Mental status withdraws to painful stimuli, not following command  Cranial Nerves PERRL, EOM normal and present, able to protect airway Motor strength spontaneous movement of both upper and lower extremity Sensation present topainful stimuli Reflexes present knee and biceps, babinski negative,   Labs on Admission:  CBC:  Recent Labs Lab 01/27/15 0732 01/28/15 0640 01/29/15 0412 01/30/15 0300 02/02/15 1558  WBC 8.9 9.3 8.0 7.2 9.2  NEUTROABS  --   --   --   --  8.0*  HGB 11.8* 9.6* 8.7* 9.1* 9.6*  HCT 36.4 30.3* 27.8* 27.9* 30.2*    MCV 91.7 92.7 93.3 93.0 92.1  PLT 245 237 235 253 460*    CMP     Component Value Date/Time   NA 136 02/02/2015 1558   K 5.1 02/02/2015 1558   CL 103 02/02/2015 1558   CO2 22 02/02/2015 1558   GLUCOSE 147* 02/02/2015 1558   BUN 24* 02/02/2015 1558   CREATININE 1.28* 02/02/2015 1558   CALCIUM 9.3 02/02/2015 1558   PROT 6.8 02/02/2015 1558   ALBUMIN 2.1* 02/02/2015 1558   AST 30 02/02/2015 1558   ALT 10* 02/02/2015 1558   ALKPHOS 83 02/02/2015 1558   BILITOT 0.4 02/02/2015 1558   GFRNONAA 37* 02/02/2015 1558   GFRAA 43* 02/02/2015 1558     Recent Labs Lab 02/02/15 1558  TROPONINI <0.03   BNP (last 3 results)  Recent Labs  02/02/15 1558  BNP 235.2*    ProBNP (last 3 results) No results for input(s): PROBNP in the last 8760 hours.   Radiological Exams on Admission: Dg Chest 2 View  02/02/2015  CLINICAL DATA:  Altered mental status. EXAM: CHEST  2 VIEW COMPARISON:  01/26/2015 FINDINGS: Prior CABG. Heart and mediastinal contours are within normal limits. No focal opacities or effusions. No acute bony abnormality. IMPRESSION: No active cardiopulmonary disease. Electronically Signed   By: Charlett Nose M.D.   On: 02/02/2015 16:30   Ct Head Wo Contrast  02/02/2015  CLINICAL DATA:  Patient with recent hip replacement. Decreased level of consciousness. EXAM: CT HEAD WITHOUT CONTRAST TECHNIQUE: Contiguous axial images were obtained from the base of the skull through the vertex without intravenous contrast. COMPARISON:  Brain CT 11/17/2011; MR brain 09/18/2014. FINDINGS: Ventricles and sulci are appropriate for patient's age. Periventricular and subcortical white matter hypodensity compatible with chronic small vessel ischemic changes. No evidence for acute cortically based infarct, intracranial hemorrhage, mass lesion or mass effect. Orbits are unremarkable. Paranasal sinuses are well aerated. Calvarium is intact. IMPRESSION: No acute intracranial process. Chronic small vessel  ischemic changes. Electronically Signed   By: Annia Belt M.D.   On: 02/02/2015 16:44   EKG: Independently reviewed. normal sinus rhythm, prolonged  QT interval.  Assessment/Plan 1. Acute encephalopathy Patient presented with change in mental status. Next and patient was found hypoglycemic in the nursing home. Her mental status changes has been ongoing since this morning. CT of the head is unremarkable. Will check MRA of the brain ABG ammonia level TSH free T4. Neurology will consider doing this workup does not show any evidence of intracranial abnormality. Next I would also check EEG. We give her aspirin suppository. Next and patient will remain nothing by mouth until seen by speech therapy.  At present the differential is most likely prolonged hypoglycemia. Also multiple psychotropic medication causing polypharmacy. Also delirium associated with pain as well as recent surgery . 2. Coronary disease. Aspirin suppository.  3. Mood disorder. Holding on any psychotropic medication.  4.recent hip fracture surgery. Pain. Currently holding all psychotropic medication as well as narcotics. When necessary Tylenol suppository.  5 hypoglycemia. Holding oral hypoglycemic agent. Next and currently sugar has improved significantly. We will monitor CBG every 2 hours.  Nutrition: nothing by mouth until fully awake DVT Prophylaxis: subcutaneous Heparin  Advance goals of care discussion: DNR/DNI  Family Communication: family was present at bedside, opportunity was given to ask question and all questions were answered satisfactorily at the time of interview. Disposition: Admitted as observation, step-down unit.  Author: Lynden Oxford, MD [facility] Pager: [phone] 02/02/2015  If 7PM-7AM, please contact night-coverage [email or web address] Password [email or web address]

## 2015-02-02 NOTE — ED Notes (Signed)
Attempted report 

## 2015-02-02 NOTE — Progress Notes (Signed)
RN, Seward GraterMaggie, paged that MRI brain results were back. Results reviewed. Family wanted results but have left the hospital. This NP called Dr. Amada JupiterKirkpatrick, neuro on call, and discussed case. He reviewed the MRI. He will see pt at some point tonight. Advised to continue ASA and neuro will proceed with stroke workup.  Sara NormanKaren Kirby-Graham, NP [facility]

## 2015-02-02 NOTE — ED Notes (Signed)
Pt transported to ct and xray.

## 2015-02-02 NOTE — ED Notes (Signed)
CBG 76 

## 2015-02-03 ENCOUNTER — Observation Stay (HOSPITAL_COMMUNITY): Payer: Medicare Other

## 2015-02-03 ENCOUNTER — Observation Stay (HOSPITAL_BASED_OUTPATIENT_CLINIC_OR_DEPARTMENT_OTHER): Payer: Medicare Other

## 2015-02-03 DIAGNOSIS — I6309 Cerebral infarction due to thrombosis of other precerebral artery: Secondary | ICD-10-CM | POA: Diagnosis not present

## 2015-02-03 DIAGNOSIS — I2510 Atherosclerotic heart disease of native coronary artery without angina pectoris: Secondary | ICD-10-CM | POA: Diagnosis present

## 2015-02-03 DIAGNOSIS — Z7189 Other specified counseling: Secondary | ICD-10-CM | POA: Diagnosis not present

## 2015-02-03 DIAGNOSIS — I639 Cerebral infarction, unspecified: Secondary | ICD-10-CM | POA: Diagnosis present

## 2015-02-03 DIAGNOSIS — F39 Unspecified mood [affective] disorder: Secondary | ICD-10-CM | POA: Diagnosis present

## 2015-02-03 DIAGNOSIS — I6319 Cerebral infarction due to embolism of other precerebral artery: Secondary | ICD-10-CM | POA: Diagnosis not present

## 2015-02-03 DIAGNOSIS — Z515 Encounter for palliative care: Secondary | ICD-10-CM | POA: Insufficient documentation

## 2015-02-03 DIAGNOSIS — E162 Hypoglycemia, unspecified: Secondary | ICD-10-CM | POA: Diagnosis not present

## 2015-02-03 DIAGNOSIS — F329 Major depressive disorder, single episode, unspecified: Secondary | ICD-10-CM | POA: Diagnosis present

## 2015-02-03 DIAGNOSIS — F0390 Unspecified dementia without behavioral disturbance: Secondary | ICD-10-CM | POA: Diagnosis present

## 2015-02-03 DIAGNOSIS — G9341 Metabolic encephalopathy: Secondary | ICD-10-CM | POA: Diagnosis present

## 2015-02-03 DIAGNOSIS — E11649 Type 2 diabetes mellitus with hypoglycemia without coma: Principal | ICD-10-CM | POA: Diagnosis present

## 2015-02-03 DIAGNOSIS — G371 Central demyelination of corpus callosum: Secondary | ICD-10-CM | POA: Diagnosis present

## 2015-02-03 DIAGNOSIS — L8990 Pressure ulcer of unspecified site, unspecified stage: Secondary | ICD-10-CM | POA: Insufficient documentation

## 2015-02-03 DIAGNOSIS — E785 Hyperlipidemia, unspecified: Secondary | ICD-10-CM | POA: Diagnosis present

## 2015-02-03 DIAGNOSIS — I6789 Other cerebrovascular disease: Secondary | ICD-10-CM

## 2015-02-03 DIAGNOSIS — Z951 Presence of aortocoronary bypass graft: Secondary | ICD-10-CM | POA: Diagnosis not present

## 2015-02-03 DIAGNOSIS — I25709 Atherosclerosis of coronary artery bypass graft(s), unspecified, with unspecified angina pectoris: Secondary | ICD-10-CM | POA: Diagnosis not present

## 2015-02-03 DIAGNOSIS — I10 Essential (primary) hypertension: Secondary | ICD-10-CM | POA: Diagnosis present

## 2015-02-03 DIAGNOSIS — Z66 Do not resuscitate: Secondary | ICD-10-CM | POA: Diagnosis present

## 2015-02-03 DIAGNOSIS — E1151 Type 2 diabetes mellitus with diabetic peripheral angiopathy without gangrene: Secondary | ICD-10-CM | POA: Diagnosis present

## 2015-02-03 DIAGNOSIS — Z7984 Long term (current) use of oral hypoglycemic drugs: Secondary | ICD-10-CM

## 2015-02-03 DIAGNOSIS — Z96642 Presence of left artificial hip joint: Secondary | ICD-10-CM | POA: Diagnosis present

## 2015-02-03 DIAGNOSIS — G9340 Encephalopathy, unspecified: Secondary | ICD-10-CM | POA: Diagnosis present

## 2015-02-03 LAB — LIPID PANEL
CHOL/HDL RATIO: 3.8 ratio
CHOLESTEROL: 102 mg/dL (ref 0–200)
HDL: 27 mg/dL — AB (ref >40)
LDL Cholesterol: 54 mg/dL (ref 0–99)
TRIGLYCERIDES: 105 mg/dL (ref <150)
VLDL: 21 mg/dL (ref 0–40)

## 2015-02-03 LAB — GLUCOSE, CAPILLARY
GLUCOSE-CAPILLARY: 117 mg/dL — AB (ref 65–99)
GLUCOSE-CAPILLARY: 134 mg/dL — AB (ref 65–99)
GLUCOSE-CAPILLARY: 158 mg/dL — AB (ref 65–99)
GLUCOSE-CAPILLARY: 98 mg/dL (ref 65–99)
Glucose-Capillary: 127 mg/dL — ABNORMAL HIGH (ref 65–99)
Glucose-Capillary: 119 mg/dL — ABNORMAL HIGH (ref 65–99)
Glucose-Capillary: 114 mg/dL — ABNORMAL HIGH (ref 65–99)
Glucose-Capillary: 172 mg/dL — ABNORMAL HIGH (ref 65–99)
Glucose-Capillary: 170 mg/dL — ABNORMAL HIGH (ref 65–99)
Glucose-Capillary: 180 mg/dL — ABNORMAL HIGH (ref 65–99)

## 2015-02-03 LAB — AMMONIA: AMMONIA: 21 umol/L (ref 9–35)

## 2015-02-03 LAB — MRSA PCR SCREENING: MRSA by PCR: NEGATIVE

## 2015-02-03 MED ORDER — CHLORHEXIDINE GLUCONATE 0.12 % MT SOLN
15.0000 mL | Freq: Two times a day (BID) | OROMUCOSAL | Status: DC
  Administered 2015-02-03 – 2015-02-05 (×6): 15 mL via OROMUCOSAL
  Filled 2015-02-03 (×8): qty 15

## 2015-02-03 MED ORDER — MORPHINE SULFATE (PF) 2 MG/ML IV SOLN
1.0000 mg | INTRAVENOUS | Status: DC | PRN
  Administered 2015-02-03 – 2015-02-04 (×7): 1 mg via INTRAVENOUS
  Filled 2015-02-03 (×7): qty 1

## 2015-02-03 MED ORDER — DEXTROSE-NACL 5-0.9 % IV SOLN
INTRAVENOUS | Status: DC
  Administered 2015-02-03 – 2015-02-05 (×3): via INTRAVENOUS

## 2015-02-03 MED ORDER — DEXTROSE-NACL 5-0.9 % IV SOLN: INTRAVENOUS | Status: DC

## 2015-02-03 MED ORDER — INSULIN ASPART 100 UNIT/ML ~~LOC~~ SOLN
0.0000 [IU] | SUBCUTANEOUS | Status: DC
  Administered 2015-02-03: 1 [IU] via SUBCUTANEOUS
  Administered 2015-02-03 – 2015-02-04 (×3): 2 [IU] via SUBCUTANEOUS
  Administered 2015-02-04: 1 [IU] via SUBCUTANEOUS
  Administered 2015-02-04: 2 [IU] via SUBCUTANEOUS
  Administered 2015-02-04 (×2): 1 [IU] via SUBCUTANEOUS
  Administered 2015-02-05: 2 [IU] via SUBCUTANEOUS
  Administered 2015-02-05: 3 [IU] via SUBCUTANEOUS
  Administered 2015-02-05: 1 [IU] via SUBCUTANEOUS

## 2015-02-03 MED ORDER — CETYLPYRIDINIUM CHLORIDE 0.05 % MT LIQD
7.0000 mL | Freq: Two times a day (BID) | OROMUCOSAL | Status: DC
  Administered 2015-02-03 – 2015-02-05 (×6): 7 mL via OROMUCOSAL

## 2015-02-03 NOTE — Evaluation (Signed)
Occupational Therapy Evaluation Patient Details Name: Sara HusbandsShirley [name] MRN: [id number] DOB: 08/10/1928 Today's Date: 02/03/2015    History of Present Illness Pt admitted from SNF, in which she was receiving ST rehab, with lethargy. Pt with L hip fx with (anterior approach) hemiarthroplasty 01/28/15.  Pt with multiple small vessel and L cerebellar CVAs vs hypoglycemic changes.  PMH: HTN, DM, CAD with CABG.   Clinical Impression   Pt was independent prior to her hip fx last week.  She has essentially been bed bound at the SNF due lethargy since 01/30/15. Pt opening her eyes only briefly with fixed gaze. Pt requiring +2 total assist for bed level mobility and sat EOB x approximately 15 minutes with max to moderate assist with tendency to lean to R and posterior. She requires total care for ADL.  Recommending SNF upon discharge.      Follow Up Recommendations  SNF;Supervision/Assistance - 24 hour    Equipment Recommendations       Recommendations for Other Services       Precautions / Restrictions Precautions Precautions: Fall Restrictions LLE Weight Bearing: Weight bearing as tolerated      Mobility Bed Mobility Overal bed mobility: +2 for physical assistance;Needs Assistance Bed Mobility: Supine to Sit;Sit to Supine     Supine to sit: +2 for physical assistance;Total assist Sit to supine: +2 for physical assistance;Total assist      Transfers                 General transfer comment: pt not safe to transfer    Balance Overall balance assessment: Needs assistance Sitting-balance support: Feet supported Sitting balance-Leahy Scale: Zero   Postural control: Posterior lean;Right lateral lean                                  ADL                                         General ADL Comments: NPO, requires total care     Vision Additional Comments: fixed gaze, opened eyes only briefly during session   Perception     Praxis      Pertinent Vitals/Pain Pain Assessment: Faces Faces Pain Scale: Hurts little more Pain Location: generalized Pain Descriptors / Indicators: Moaning Pain Intervention(s): Repositioned;Monitored during session     Hand Dominance     Extremity/Trunk Assessment Upper Extremity Assessment Upper Extremity Assessment: RUE deficits/detail;LUE deficits/detail RUE Deficits / Details: no functional movement noted, tone varied from increased extensor in sitting to increased flexor in supine, full PROM all areas RUE Coordination: decreased fine motor;decreased gross motor LUE Deficits / Details: no functional movement noted, increased flexor tone in hand noted at end of session, positioned with wash cloths, full PROM  LUE Coordination: decreased fine motor;decreased gross motor   Lower Extremity Assessment Lower Extremity Assessment: Defer to PT evaluation   Cervical / Trunk Assessment Cervical / Trunk Assessment: Kyphotic (with forward head)   Communication Communication Communication:  (Per daughter no hearing deficits )   Cognition Arousal/Alertness: Lethargic Behavior During Therapy: Flat affect Overall Cognitive Status: Difficult to assess                     General Comments       Exercises       Shoulder Instructions  Home Living Family/patient expects to be discharged to:: Skilled nursing facility                                        Prior Functioning/Environment          Comments: Pt was independent prior to hip fx, had assist intermittently    OT Diagnosis: Generalized weakness;Cognitive deficits;Disturbance of vision;Acute pain   OT Problem List: Decreased strength;Decreased activity tolerance;Impaired balance (sitting and/or standing);Decreased coordination;Decreased cognition;Impaired tone;Impaired UE functional use;Pain   OT Treatment/Interventions:      OT Goals(Current goals can be found in the care plan section)    OT  Frequency:     Barriers to D/C:            Co-evaluation PT/OT/SLP Co-Evaluation/Treatment: Yes Reason for Co-Treatment: Complexity of the patient's impairments (multi-system involvement)   OT goals addressed during session: Strengthening/ROM      End of Session Nurse Communication:  (pt moaning, positioned on R side)  Activity Tolerance: Patient limited by lethargy Patient left: in bed;with call bell/phone within reach;with bed alarm set;with family/visitor present   Time: 1610-9604 OT Time Calculation (min): 37 min Charges:    G-Codes: OT G-codes **NOT FOR INPATIENT CLASS** Functional Assessment Tool Used: clinical judgement Functional Limitation: Self care Self Care Current Status (V4098): 100 percent impaired, limited or restricted Self Care Discharge Status (J1914): 100 percent impaired, limited or restricted  Evern Bio 02/03/2015, 1:10 PM  [phone]

## 2015-02-03 NOTE — Progress Notes (Signed)
SLP Cancellation Note  Patient Details Name: Sara HusbandsShirley [name] MRN: [id number] DOB: 05/14/1928   Cancelled treatment:       Reason Eval/Treat Not Completed: Fatigue/lethargy limiting ability to participate. Will continue to follow for readiness.    Nhu Glasby, Riley NearingBonnie Caroline 02/03/2015, 11:04 AM

## 2015-02-03 NOTE — Progress Notes (Signed)
EEG completed; results pending.    

## 2015-02-03 NOTE — Progress Notes (Signed)
PT Cancellation Note  Patient Details Name: Sara HusbandsShirley [name] MRN: [id number] DOB: 07/30/1928   Cancelled Treatment:    Reason Eval/Treat Not Completed: Patient at procedure or test/unavailable (Attempted to see pt x2, procedure in process both times).  PT will continue to follow acutely.  Thank you for this order.  Michail JewelsAshley Parr PT, DPT [phone] Pager: [phone] 02/03/2015, 10:34 AM

## 2015-02-03 NOTE — Consult Note (Signed)
Neurology Consultation Reason for Consult: Altered mental status Referring Physician: Leodis Binet  CC: Altered mental status  History is obtained from: Chart  HPI: [name] is a 81 y.o. female was last in her normal state of health last evening. This morning, she was drowsy and lethargic in the nursing home she did not improve.  According to notes, glucose the morning of 11/22 was 188, but then she did not have lunch or breakfast CBG was then 54. She was given glucagon and dextrose. EMS was called and checked her glucose and found to be low(36). She was 126 and the first one we have documented here. She was started on D50 and has a glucose the 70s to 80s.  An MRI was obtained which shows diffuse hazy restricted diffusion throughout the subcortical white matter. In addition there are multifocal punctate cortical areas of restricted diffusion.   LKW: 7 PM 11/21 tpa given?: no, out of window   ROS: A 14 point ROS was performed and is negative except as noted in the HPI.   Past Medical History  Diagnosis Date  . Shortness of breath   . CAD (coronary artery disease)     Last catheterization 2009, grafts patent, EF 70%, 80% small OM with possible mild ischemic  . Hx of CABG     2000, LIMA to LAD, SVG to diagonal, SVG to OM, SVG to posterior descending  . Ejection fraction     EF 70%,, 2009 /  EF 65%, echo, October, 2011  . Hypertension   . Palpitations   . Dyslipidemia   . Diabetes mellitus   . Pancreatitis   . Carotid artery disease (HCC)     Mild, Doppler, 2000  . Dizziness     Evaluated April, 2008, no treatment needed  . Cervical spine disease     Severe neck pain, 2011  . Hyperkalemia     Her doctor Zachery Dauer, September, 2012, ACE inhibitor stopped, probable amlodipine to be  . Shortness of breath   . History of hysterectomy   . Chest pain     Chest hurting, May, 2013     Family History  Problem Relation Age of Onset  . Cancer Mother   . Dementia Mother   . Heart  failure Father   . Coronary artery disease Father   . Cancer      siblings  . Heart failure      siblings  . Dementia Brother   . Parkinsonism Brother      Social History:  reports that she has never smoked. She does not have any smokeless tobacco history on file. She reports that she does not drink alcohol or use illicit drugs.   Exam: Current vital signs: BP 126/56 mmHg  Pulse 89  Temp(Src) 98.6 F (37 C) (Oral)  Resp 22  Ht  (1.702 m)  Wt 67.813 kg (149 lb 8 oz)  BMI 23.41 kg/m2  SpO2 96% Vital signs in last 24 hours: Temp:  [97.1 F (36.2 C)-98.7 F (37.1 C)] 98.6 F (37 C) (11/22 2255) Pulse Rate:  [80-95] 89 (11/23 0000) Resp:  [18-25] 22 (11/23 0000) BP: (106-157)/(53-127) 126/56 mmHg (11/23 0000) SpO2:  [96 %-99 %] 96 % (11/23 0000) Weight:  [67.813 kg (149 lb 8 oz)] 67.813 kg (149 lb 8 oz) (11/22 2053)   Physical Exam  Constitutional: Appears elderly Psych: Patient is unresponsive Eyes: No scleral injection HENT: No OP obstrucion Head: Normocephalic.  Cardiovascular: Normal rate and regular rhythm.  Respiratory: Effort normal and breath sounds normal to anterior ascultation GI: Soft.  No distension. There is no tenderness.  Skin: WDI  Neuro: Mental Status: Patient is constantly moaning. She opens her eyes partially to noxious stimuli. She does not follow commands. Cranial Nerves: II: She does not blink to threat Pupils are equal, round, and reactive to light.   III,IV, VI: Doll's eye intact V: VII: She blinks to eyelid stimulation bilaterally VIII, X, XI, XII: Unable to assess secondary to patient's altered mental status.  Motor: Tone is possibly mildly increased in the right arm. She has spontaneous voluntary movements of her legs and withdraws them, flexes bilateral arms to noxious stimuli. Sensory: Response to noxious stimuli in all 4 extremities Deep Tendon Reflexes: 2+ and symmetric in the biceps and patellae.  Plantars: Toes are  upgoing bilaterally.  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 The multiple punctate injuries seen on diffusion I'm not certain if they represent infarct versus focal areas of hypoglycemic injury. If they are embolic infarcts, and I have they're incidental to her presenting symptoms. The subcortical white matter change on MRI has been described as being reversible and I think that it is too early to say that she does not have a significant chance of recovery at this point.  Recommendations: 1) EEG 2) continue supportive care 3) ammonia 4) stroke workup as follows:  1. HgbA1c, fasting lipid panel  2. Frequent neuro checks  3. Echocardiogram  4. Carotid dopplers  5. Prophylactic therapy-Antiplatelet med: Aspirin - dose 325mg  PO or 300mg  PR  6. Risk factor modification  7. Telemetry monitoring    Ritta SlotMcNeill Lamira Borin, MD [facility] [phone]  If 7pm- 7am, please page neurology on call as listed in AMION.

## 2015-02-03 NOTE — Progress Notes (Signed)
UR COMPLETED  

## 2015-02-03 NOTE — Procedures (Signed)
ELECTROENCEPHALOGRAM REPORT  Date of Study: 02/03/2015  Patient's Name: [name] MRN: [id number] Date of Birth: Jul 16, 1928  Referring Provider: Dr. Lynden OxfordPranav Patel  Clinical History: This is a 81 year old woman with altered mental status.   Medications: acetaminophen (TYLENOL) tablet 650 mg aspirin tablet 325 mg morphine 2 MG/ML injection 1 mg pantoprazole (PROTONIX) injection 40 mg  Technical Summary: A multichannel digital EEG recording measured by the international 10-20 system with electrodes applied with paste and impedances below 5000 ohms performed as portable with EKG monitoring in an awake and confused patient.  Hyperventilation and photic stimulation were not performed.  The digital EEG was referentially recorded, reformatted, and digitally filtered in a variety of bipolar and referential montages for optimal display.   Description: The patient is awake and confused during the recording.  There is no clear posterior dominant rhythm. The background consists of a large amount of diffuse 4-7 Hz theta and 2-3 Hz delta slowing with occasional triphasic-like waves seen.  Hyperventilation and photic stimulation were not performed.  There were no epileptiform discharges or electrographic seizures seen.    EKG lead showed sinus tachycardia.  Impression: This awake EEG is abnormal due to moderate diffuse slowing of the waking background with triphasic-like waves seen.  Clinical Correlation of the above findings indicates diffuse cerebral dysfunction that is non-specific in etiology and can be seen with hypoxic/ischemic injury, toxic/metabolic encephalopathies, neurodegenerative disorders, or medication effect. Triphasic-like waves are typically seen with hepatic encephalopathy, but may be seen with other metabolic encephalopathies as well. There were no electrographic seizures in this study.    Patrcia DollyKaren Aquino, M.D.

## 2015-02-03 NOTE — Progress Notes (Signed)
[facility] PROGRESS NOTE  [name] JWJ:[id number] DOB: Dec 22, 1928 DOA: 02/02/2015 PCP: Gaye Alken, MD  Assessment/Plan: 1. Acute CVA vs Hypoglycemic Brain Injury. -[name] is an 81 year old with multiple comorbidities including hypertension, diabetes mellitus, solid history of coronary artery disease found minimally responsive by nursing home staff. She was found to be hypoglycemic with blood sugars in the 30s. -She was initially worked up with an MRI of brain that revealed multiple small focal areas of restricted diffusion in both cerebral hemispheres and the left cerebellum compatible with acute infarct.  -Patient was seen and evaluated by neurology overnight. Changes on MRI could be consistent with hypoglycemic brain injury. Other possibilities include thromboembolic phenomena. -Will place patient on CVA protocol, further workup with bilateral carotid Dopplers, transthoracic echocardiogram, MRA of brain. -Continue antiplatelet therapy with aspirin. -Neurology consulted -Remains nothing by mouth  2.  Hypoglycemia with history of diabetes mellitus. -Patient with history of diabetes mellitus had been on Januvia 100 mg by mouth every daily, metformin 500 mg daily at bedtime and 1000 mg every morning, and glimeperide 4 mg by mouth daily per discharge summary of 01/30/2015.  -She presented with hypoglycemia requiring treatment with IV dextrose. -Has mentioned above changes on MRI could reflect hypoglycemic brain injury -Blood sugar stabilizes morning, we'll continue to monitor. -Will change Accu-Cheks to every 4 hours  3.  Acute encephalopathy -Likely related to hypoglycemia as she was found to have blood sugars in the 30s at a skilled nursing facility. -This could've also been related to acute CVA -On this mornings assessment she remains minimally responsive, I do not observe purposeful movements.   4.  History of left femoral neck fracture -Status post  hemiarthroplasty on 01/28/2015  5.  Goals of care -I spoke with her daughter Patton Salles regarding medical goals of care for her mother. I explained MRI findings and ongoing poor responsiveness this morning.  With her advanced age, multiple comorbidities, MRI findings and lack of improvement despite correction of blood sugars, prognosis remains poor. She stated that the priority here was focusing on her mother's comfort. She would like her mother to complete CVA workup with MRA, transthoracic echocardiogram, carotid Dopplers. She agrees with palliative care consultation.     Code Status: DO NOT RESUSCITATE  Family Communication: I spoke with her daughter this morning over telephone  Disposition Plan: Plan to complete CVA workup, pending palliative care consultation. Poor prognosis.   Consultants:  Neurology   HPI/Subjective: Sara Agrawal 's an 81 year old female with a past medical history of coronary artery disease, hypertension, diabetes mellitus, carotid artery disease, recently admitted from 01/26/2015 through 01/30/2015 where she was treated for left femoral neck fracture, undergoing hemiarthroplasty on 01/28/2015. She was discharged to skilled nursing facility for acute rehabilitation. She was transferred to the emergency department on 02/02/2015 found unresponsive by nursing home staff. She was found to be hypoglycemic with blood sugars in the 30s. She was given IV dextrose. Overnight an MRI revealed multiple small focal areas involving bilateral cerebral hemispheres and left cerebellum have an appearance of acute CVA. She was evaluated by neurology who felt that changes on MRI could also be consistent with consequences of hypoglycemia.  Objective: Filed Vitals:   02/03/15 0407 02/03/15 0600  BP: 123/61 147/72  Pulse: 82 102  Temp:    Resp:  16    Intake/Output Summary (Last 24 hours) at 02/03/15 0730 Last data filed at 02/03/15 0600  Gross per 24 hour  Intake 390.83 ml  Output  0 ml  Net 390.83 ml   Filed Weights   02/02/15 2053  Weight: 67.813 kg (149 lb 8 oz)    Exam:   General:  Patient is minimally responsive, cannot open her eyes to command, I cannot elicit any purposeful movement   Cardiovascular: 2/6 systolic ejection murmur   Respiratory: Normal respiratory effort, lungs are clear to auscultation   Abdomen: Soft nontender nondistended   Musculoskeletal: Suspected deep tissue injury to sacral decub  Neurological: I cannot perform adequate neurologic examination given unresponsiveness. She does not have purposeful movements, cannot follow commands nor open her eyes to command.  Data Reviewed: Basic Metabolic Panel:  Recent Labs Lab 01/27/15 0732 01/28/15 0640 01/29/15 0412 01/30/15 0300 02/02/15 1558  NA 141 137 137 134* 136  K 2.5* 3.4* 4.5 4.9 5.1  CL 106 106 107 104 103  CO2 23 22 23 24 22   GLUCOSE 65 111* 95 199* 147*  BUN 10 15 15  23* 24*  CREATININE 1.30* 1.19* 1.21* 1.27* 1.28*  CALCIUM 8.8* 8.6* 8.2* 8.6* 9.3  MG 1.1* 1.6*  --   --   --    Liver Function Tests:  Recent Labs Lab 02/02/15 1558  AST 30  ALT 10*  ALKPHOS 83  BILITOT 0.4  PROT 6.8  ALBUMIN 2.1*   No results for input(s): LIPASE, AMYLASE in the last 168 hours.  Recent Labs Lab 02/03/15 0311  AMMONIA 21   CBC:  Recent Labs Lab 01/27/15 0732 01/28/15 0640 01/29/15 0412 01/30/15 0300 02/02/15 1558  WBC 8.9 9.3 8.0 7.2 9.2  NEUTROABS  --   --   --   --  8.0*  HGB 11.8* 9.6* 8.7* 9.1* 9.6*  HCT 36.4 30.3* 27.8* 27.9* 30.2*  MCV 91.7 92.7 93.3 93.0 92.1  PLT 245 237 235 253 460*   Cardiac Enzymes:  Recent Labs Lab 02/02/15 1558  TROPONINI <0.03   BNP (last 3 results)  Recent Labs  02/02/15 1558  BNP 235.2*    ProBNP (last 3 results) No results for input(s): PROBNP in the last 8760 hours.  CBG:  Recent Labs Lab 02/02/15 2252 02/03/15 0047 02/03/15 0349 02/03/15 0530 02/03/15 0635  GLUCAP 83 98 127* 119* 114*     Recent Results (from the past 240 hour(s))  MRSA PCR Screening     Status: None   Collection Time: 02/03/15 12:03 AM  Result Value Ref Range Status   MRSA by PCR NEGATIVE NEGATIVE Final    Comment:        The GeneXpert MRSA Assay (FDA approved for NASAL specimens only), is one component of a comprehensive MRSA colonization surveillance program. It is not intended to diagnose MRSA infection nor to guide or monitor treatment for MRSA infections.      Studies: Dg Chest 2 View  02/02/2015  CLINICAL DATA:  Altered mental status. EXAM: CHEST  2 VIEW COMPARISON:  01/26/2015 FINDINGS: Prior CABG. Heart and mediastinal contours are within normal limits. No focal opacities or effusions. No acute bony abnormality. IMPRESSION: No active cardiopulmonary disease. Electronically Signed   By: Charlett NoseKevin  Dover M.D.   On: 02/02/2015 16:30   Ct Head Wo Contrast  02/02/2015  CLINICAL DATA:  Patient with recent hip replacement. Decreased level of consciousness. EXAM: CT HEAD WITHOUT CONTRAST TECHNIQUE: Contiguous axial images were obtained from the base of the skull through the vertex without intravenous contrast. COMPARISON:  Brain CT 11/17/2011; MR brain 09/18/2014. FINDINGS: Ventricles and sulci are appropriate for patient's age. Periventricular and subcortical  white matter hypodensity compatible with chronic small vessel ischemic changes. No evidence for acute cortically based infarct, intracranial hemorrhage, mass lesion or mass effect. Orbits are unremarkable. Paranasal sinuses are well aerated. Calvarium is intact. IMPRESSION: No acute intracranial process. Chronic small vessel ischemic changes. Electronically Signed   By: Annia Belt M.D.   On: 02/02/2015 16:44   Mr Brain Wo Contrast  02/02/2015  CLINICAL DATA:  Acute encephalopathy.  Hypoglycemia EXAM: MRI HEAD WITHOUT CONTRAST TECHNIQUE: Multiplanar, multiecho pulse sequences of the brain and surrounding structures were obtained without  intravenous contrast. COMPARISON:  MRI 09/18/2014 FINDINGS: Multiple small areas of restricted diffusion compatible with acute infarct. These all measure under 1 cm in are present in the right frontal lobe, left parietal lobe, and left cerebellum. The cerebral white matter and internal capsule bilaterally is diffusely hyperintense on diffusion-weighted imaging. This shows low signal on ADC map. This not felt to represent acute infarct but could be related to is leukoencephalopathy secondary to hypoglycemia. Diffusion-weighted imaging was normal on the prior study. On today's study, the diffusion-weighted abnormality in the white matter is much more extensive than that seen on T2 or FLAIR Mild atrophy. Mild chronic microvascular ischemic change in the white matter and pons. Negative for hemorrhage or mass lesion. No shift of the midline structures. IMPRESSION: Multiple small focal areas of restricted diffusion in both cerebral hemispheres and the left cerebellum compatible with acute infarct. Atrophy and mild chronic microvascular ischemia. Patchy diffuse diffusion hyperintensity throughout the parietal white matter bilaterally is likely related to the patient's hypoglycemia. This may be reversible. Follow-up MRI suggested. Electronically Signed   By: Marlan Palau M.D.   On: 02/02/2015 20:40    Scheduled Meds: .  stroke: mapping our early stages of recovery book   Does not apply Once  . antiseptic oral rinse  7 mL Mouth Rinse q12n4p  . aspirin  300 mg Rectal Daily   Or  . aspirin  325 mg Oral Daily  . chlorhexidine  15 mL Mouth Rinse BID  . heparin  5,000 Units Subcutaneous 3 times per day  . pantoprazole (PROTONIX) IV  40 mg Intravenous Q24H   Continuous Infusions: . dextrose 50 mL/hr at 02/03/15 0636    Principal Problem:   Acute encephalopathy Active Problems:   CAD- CABG '00. Cath in '09 and 05/29/13- patent grafts   Hypertension   Dyslipidemia   Depression   Dementia without behavioral  disturbance   Fracture of femoral neck, left (HCC)   Pressure ulcer    Time spent: 40 min    Jeralyn Bennett  [facility] Pager [phone]. If 7PM-7AM, please contact night-coverage at [email or web address], password [facility] 02/03/2015, 7:30 AM

## 2015-02-03 NOTE — Consult Note (Addendum)
Consultation Note Date: 02/03/2015   Patient Name: [name]  DOB: [birth year]  MRN: [id number]  Age / Sex: 81 y.o., female  PCP: Leighton Ruff, MD Referring Physician: Kelvin Cellar, MD  Reason for Consultation: Establishing goals of care    Clinical Assessment/Narrative: [name] is an 81 year old with multiple comorbidities including hypertension, diabetes mellitus, and coronary artery disease found minimally responsive by nursing home staff.  Her recent history is notable for femoral neck fracture was repaired on 11/17. Following this, she had some increased sleepiness over the weekend. She was transitioned to nursing facility where she was found to be largely unresponsive just prior to admission. She was found to be hypoglycemic with blood sugars in the 30s. Her blood sugar has been corrected which remains largely unresponsive at this time.  Met today with 3 of her daughters, including [name], her POA.  They tell me that the most important things to [name] have been her family in maintaining her independence. They report that just prior to admission for femoral neck fracture that she was living independently. They also report that she has been very consistent in saying that she would never want to be placed in a nursing facility and if her condition were such that she would not be able to return to her home that she would rather focus only on her comfort.  Her family reports being upset that this incident occurred so soon after she was discharged from the hospital, but also report that physicians have been doing a good job explaining things to them.   We had a long conversation regarding what pathways forward may look like as well as the fact that we really don't know how much recovery she could potentially have. They report understanding this fact, and also report understanding that her nutrition is  going to become a large factor over the next several days if she does not regain enough function to maintain her nutritional status.  Contacts/Participants in Discussion: Primary Decision Maker: [name] in conjunction with other family   Relationship to Patient daughter HCPOA: yes   SUMMARY OF RECOMMENDATIONS - I had a long conversation with patient's family including her daughter, Rodena Piety, who is her POA  - Family is invested in continuing workup for CVA. At the same time, they report that they are interested in making sure that she remains comfortable and this is a major concern. We talked about what pathways forward over the next couple of days may look like, including:  1) Clinical improvement in the short-term to the point where she can maintain her nutritional status (we discussed that I feel this is very unlikely. Neurology indicated that it may take in the order of months to determine how much functional status she may regain overall.)  2) Continued clinical decline (in which case family would like to focus on comfort) 3) No change in her condition (in which case her nutritional status will become a major consideration moving forward.  It did not seem as though the patient would want to have feeding tube placed based upon our initial conversation.)   - I provided her daughter with a copy of hard choices for loving people.  - Her family is very reasonable and does not want to be overly aggressive in her care. At the same time, they would like to complete workup for CVA and see how things go over the next 24-48 hours. We will plan to meet again on Friday at 11 AM to assess  how she is doing at that time as well as continue discussion on goals and plan moving forward. If she does not show signs of improvement, I do think there is a high possibility that we will refocus her care strictly on comfort at that time with possible placement at residential hospice facility if she is clinically stable  enough to consider transition.   -FOLLOW UP MEETING SCHEDULED FOR 11/25 @ 11:00am. Please call if we can be of further assistance in the meantime.  Code Status/Advance Care Planning: DNR    Code Status Orders        Start     Ordered   02/02/15 1811  Do not attempt resuscitation (DNR)   Continuous    Question Answer Comment  In the event of cardiac or respiratory ARREST Do not call a "code blue"   In the event of cardiac or respiratory ARREST Do not perform Intubation, CPR, defibrillation or ACLS   In the event of cardiac or respiratory ARREST Use medication by any route, position, wound care, and other measures to relive pain and suffering. May use oxygen, suction and manual treatment of airway obstruction as needed for comfort.      02/02/15 1813      Other Directives:Advanced Directive  Symptom Management:   Pain: Patient moaning during examination. I'm unsure if this is related to her current neurologic state, the fact she is having a bowel movement, or being related to pain. She does appear to have moaning whenever anyone is touching her, but she does not have any other signs that would be indicative of being in pain including absence of facial grimacing or agitated movements. She does have pain medication ordered and would continue to use as necessary to ensure her comfort. She does stop moaning as soon as everyone stops touching her and appeared to be resting comfortably. Therefore did not administer pain medication at this time. Would have low threshold to begin use of pain medication as needed. Discussed with her bedside nurse.  Palliative Prophylaxis:   Bowel Regimen, Delirium Protocol and Palliative Wound Care  Additional Recommendations (Limitations, Scope, Preferences):  Plan to complete testing as recommended by neurology. We will also continue with watchful waiting for the next 24-48 hours. If her condition were to decline, would focus on her  comfort.  Psycho-social/Spiritual:  Support System: Strong Desire for further Chaplaincy support: Her home pastor is at the bedside at time of exam Additional Recommendations: Education on Hospice  Prognosis: Unable to determine. At this time, she remains largely unresponsive and does not follow commands. I discussed with her family at length today and plan is to reassess in 48 hours. If she is unable to maintain her on nutritional status by that time, we will need to consider artificial nutrition versus only continuing care the focuses on her comfort. I impression talking to family today is that the patient would want to focus only on comfort care if she were to truly understand her situation. If she remains unable to adequately maintain her nutrition her expected prognosis will be less than 2 weeks.  Discharge Planning: To be determined. I think there is a high likelihood she will transition to residential hospice she does not show improvement in the next 48 hours.   Chief Complaint/ Primary Diagnoses: Present on Admission:  . Acute encephalopathy . CAD- CABG '00. Cath in '09 and 05/29/13- patent grafts . Hypertension . Dyslipidemia . Depression . Dementia without behavioral disturbance . Fracture of femoral neck,  left [facility])  I have reviewed the medical record, interviewed the patient and family, and examined the patient. The following aspects are pertinent.  Past Medical History  Diagnosis Date  . Shortness of breath   . CAD (coronary artery disease)     Last catheterization 2009, grafts patent, EF 70%, 80% small OM with possible mild ischemic  . Hx of CABG     2000, LIMA to LAD, SVG to diagonal, SVG to OM, SVG to posterior descending  . Ejection fraction     EF 70%,, 2009 /  EF 65%, echo, October, 2011  . Hypertension   . Palpitations   . Dyslipidemia   . Diabetes mellitus   . Pancreatitis   . Carotid artery disease (HCC)     Mild, Doppler, 2000  . Dizziness     Evaluated  April, 2008, no treatment needed  . Cervical spine disease     Severe neck pain, 2011  . Hyperkalemia     Her doctor Drema Dallas, September, 2012, ACE inhibitor stopped, probable amlodipine to be  . Shortness of breath   . History of hysterectomy   . Chest pain     Chest hurting, May, 2013   Social History   Social History  . Marital Status: Divorced    Spouse Name: N/A  . Number of Children: 7  . Years of Education: 12   Occupational History  . retired Community education officer   Social History Main Topics  . Smoking status: Never Smoker   . Smokeless tobacco: None     Comment: quit in the 1970's  . Alcohol Use: No  . Drug Use: No  . Sexual Activity: Not Asked   Other Topics Concern  . None   Social History Narrative   Lives at home with herself.   Caffeine use: 1 cup coffee per day (decaf)   Family History  Problem Relation Age of Onset  . Cancer Mother   . Dementia Mother   . Heart failure Father   . Coronary artery disease Father   . Cancer      siblings  . Heart failure      siblings  . Dementia Brother   . Parkinsonism Brother    Scheduled Meds: .  stroke: mapping our early stages of recovery book   Does not apply Once  . antiseptic oral rinse  7 mL Mouth Rinse q12n4p  . aspirin  300 mg Rectal Daily   Or  . aspirin  325 mg Oral Daily  . chlorhexidine  15 mL Mouth Rinse BID  . heparin  5,000 Units Subcutaneous 3 times per day  . pantoprazole (PROTONIX) IV  40 mg Intravenous Q24H   Continuous Infusions: . dextrose 5 % and 0.9% NaCl 50 mL/hr at 02/03/15 0914   PRN Meds:.acetaminophen **OR** acetaminophen, morphine injection Medications Prior to Admission:  Prior to Admission medications   Medication Sig Start Date End Date Taking? Authorizing Provider  acetaminophen (TYLENOL) 500 MG tablet Take 2 tablets (1,000 mg total) by mouth every 8 (eight) hours. 01/30/15  Yes Shanker Kristeen Mans, MD  albuterol (PROVENTIL) (2.5 MG/3ML) 0.083% nebulizer solution Take 3  mLs (2.5 mg total) by nebulization every 2 (two) hours as needed for shortness of breath. 01/30/15  Yes Shanker Kristeen Mans, MD  amLODipine (NORVASC) 10 MG tablet Take 1 tablet (10 mg total) by mouth daily. 09/15/14  Yes Carlena Bjornstad, MD  aspirin EC 325 MG tablet Take 1 tablet (325 mg  total) by mouth daily with breakfast. 01/29/15  Yes Rod Can, MD  atorvastatin (LIPITOR) 20 MG tablet Take 1 tablet (20 mg total) by mouth daily. Patient taking differently: Take 20 mg by mouth daily at 6 PM.  03/14/12  Yes Carlena Bjornstad, MD  buPROPion [facility] SR) 100 MG 12 hr tablet Take 100 mg by mouth 2 (two) times daily.   Yes Historical Provider, MD  clopidogrel (PLAVIX) 75 MG tablet Take 1 tablet (75 mg total) by mouth daily. Continue on hold for 2 more weeks. Patient taking differently: Take 75 mg by mouth daily.  12/05/11  Yes Barton Dubois, MD  DULoxetine (CYMBALTA) 60 MG capsule Take 60 mg by mouth daily.   Yes Historical Provider, MD  feeding supplement, ENSURE ENLIVE, (ENSURE ENLIVE) LIQD Take 237 mLs by mouth 3 (three) times daily between meals. 01/30/15  Yes Shanker Kristeen Mans, MD  furosemide (LASIX) 20 MG tablet Take 20 mg by mouth as needed for fluid (PATIENT TAKES ONE TABLET BY MOUTH AS NEEDED FOR FLUID RETENSION).  08/25/13  Yes Historical Provider, MD  gabapentin (NEURONTIN) 100 MG capsule Take 200 mg by mouth 2 (two) times daily.   Yes Historical Provider, MD  glimepiride (AMARYL) 2 MG tablet Take 4 mg by mouth daily with breakfast.    Yes Historical Provider, MD  LORazepam (ATIVAN) 0.5 MG tablet Take 1 tablet (0.5 mg total) by mouth 2 (two) times daily. 01/30/15  Yes Shanker Kristeen Mans, MD  memantine (NAMENDA) 10 MG tablet Take 1 tablet (10 mg total) by mouth 2 (two) times daily. 01/30/15  Yes Shanker Kristeen Mans, MD  metFORMIN (GLUCOPHAGE-XR) 500 MG 24 hr tablet Take 500-1,000 mg by mouth daily with breakfast. 1000 mg in morning and 500 at  Night   Yes Historical Provider, MD  nitroGLYCERIN  (NITROSTAT) 0.4 MG SL tablet Place 1 tablet (0.4 mg total) under the tongue every 5 (five) minutes as needed. For chest pain 04/18/13  Yes Carlena Bjornstad, MD  oxyCODONE (OXY IR/ROXICODONE) 5 MG immediate release tablet Take 1 tablet (5 mg total) by mouth every 4 (four) hours as needed for moderate pain. 02/01/15  Yes Tiffany L Reed, DO  pantoprazole (PROTONIX) 40 MG tablet Take 1 tablet (40 mg total) by mouth daily. 09/15/14  Yes Carlena Bjornstad, MD  polyethylene glycol [facility] Raveling) packet Take 17 g by mouth daily as needed. Patient taking differently: Take 17 g by mouth daily as needed for mild constipation or moderate constipation.  01/30/15  Yes Shanker Kristeen Mans, MD  senna-docusate (SENOKOT-S) 8.6-50 MG tablet Take 1 tablet by mouth at bedtime. 01/30/15  Yes Shanker Kristeen Mans, MD  sitaGLIPtin (JANUVIA) 100 MG tablet Take 100 mg by mouth daily.     Yes Historical Provider, MD  buPROPion (WELLBUTRIN) 100 MG tablet Take 1 tablet (100 mg total) by mouth 2 (two) times daily. Patient not taking: Reported on 02/02/2015 01/08/15   Melvenia Beam, MD   Allergies  Allergen Reactions  . Codeine Hives    Review of Systems  Unable to perform ROS   Physical Exam   General: Patient is minimally responsive, moans but does not follow any commands Cardiovascular: Regular, systolic ejection murmur   Respiratory: Normal respiratory effort, lungs are clear with anterior auscultation   Abdomen: Soft nontender nondistended   Neurological: She does not have purposeful movements, cannot follow commands nor open her eyes to command.  Vital Signs: BP 122/61 mmHg  Pulse 89  Temp(Src) 98.1  F (36.7 C) (Oral)  Resp 21  Ht '5\' 7"'  (1.702 m)  Wt 67.813 kg (149 lb 8 oz)  BMI 23.41 kg/m2  SpO2 98%  SpO2: SpO2: 98 % O2 Device:SpO2: 98 % O2 Flow Rate: .   IO: Intake/output summary:  Intake/Output Summary (Last 24 hours) at 02/03/15 1616 Last data filed at 02/03/15 1200  Gross per 24 hour  Intake  614.16 ml  Output      0 ml  Net 614.16 ml    LBM: Last BM Date: 02/02/15 Baseline Weight: Weight: 67.813 kg (149 lb 8 oz) Most recent weight: Weight: 67.813 kg (149 lb 8 oz)      Palliative Assessment/Data:  Flowsheet Rows        Most Recent Value   Intake Tab    Referral Department  Hospitalist   Unit at Time of Referral  Intermediate Care Unit   Palliative Care Primary Diagnosis  Neurology   Date Notified  02/03/15   Palliative Care Type  New Palliative care   Reason for referral  Clarify Goals of Care   Date of Admission  02/02/15   Date first seen by Palliative Care  02/03/15   # of days Palliative referral response time  0 Day(s)   # of days IP prior to Palliative referral  1   Clinical Assessment    Palliative Performance Scale Score  10%   Pain Max last 24 hours  Not able to report   Pain Min Last 24 hours  Not able to report   Psychosocial & Spiritual Assessment    Palliative Care Outcomes    Patient/Family meeting held?  Yes   Palliative Care Outcomes  Counseled regarding hospice, Provided advance care planning, Provided psychosocial or spiritual support      Additional Data Reviewed:  CBC:    Component Value Date/Time   WBC 9.2 02/02/2015 1558   HGB 9.6* 02/02/2015 1558   HCT 30.2* 02/02/2015 1558   PLT 460* 02/02/2015 1558   MCV 92.1 02/02/2015 1558   NEUTROABS 8.0* 02/02/2015 1558   LYMPHSABS 0.6* 02/02/2015 1558   MONOABS 0.6 02/02/2015 1558   EOSABS 0.0 02/02/2015 1558   BASOSABS 0.0 02/02/2015 1558   Comprehensive Metabolic Panel:    Component Value Date/Time   NA 136 02/02/2015 1558   K 5.1 02/02/2015 1558   CL 103 02/02/2015 1558   CO2 22 02/02/2015 1558   BUN 24* 02/02/2015 1558   CREATININE 1.28* 02/02/2015 1558   GLUCOSE 147* 02/02/2015 1558   CALCIUM 9.3 02/02/2015 1558   AST 30 02/02/2015 1558   ALT 10* 02/02/2015 1558   ALKPHOS 83 02/02/2015 1558   BILITOT 0.4 02/02/2015 1558   PROT 6.8 02/02/2015 1558   ALBUMIN 2.1* 02/02/2015  1558     Time In: 0930 Time Out: 1100 Time Total: 90 Greater than 50%  of this time was spent counseling and coordinating care related to the above assessment and plan.  Signed by: Micheline Rough, MD  Micheline Rough, MD  02/03/2015, 4:16 PM  Please contact Palliative Medicine Team phone at [phone] for questions and concerns.

## 2015-02-03 NOTE — Evaluation (Signed)
Physical Therapy Evaluation Patient Details Name: [name] MRN: [id number] DOB: 06/09/1928 Today's Date: 02/03/2015   History of Present Illness  Pt admitted from SNF, in which she was receiving ST rehab, with lethargy. Pt with L hip fx with (anterior approach) hemiarthroplasty 01/28/15.  Pt with multiple small vessel and L cerebellar CVAs vs hypoglycemic changes.  PMH: HTN, DM, CAD with CABG.  Clinical Impression  Pt admitted with above diagnosis. Pt currently with functional limitations due to the deficits listed below (see PT Problem List). [name] was Ind prior to recent hip fx, was at mod>max+2 assist for stand pivot at d/c from hospital and per family pt performed sit<>stand while at [facility].  Pt currently requiring +2 total assist for bed mobility and mod>max assist for stability while sitting EOB. Pt will benefit from skilled PT to increase their independence and safety with mobility to allow discharge to the venue listed below.      Follow Up Recommendations SNF;Supervision/Assistance - 24 hour    Equipment Recommendations  Other (comment) (TBD at next venue of care)    Recommendations for Other Services       Precautions / Restrictions Precautions Precautions: Fall Restrictions Weight Bearing Restrictions: Yes LLE Weight Bearing: Weight bearing as tolerated (per chart review)      Mobility  Bed Mobility Overal bed mobility: +2 for physical assistance;Needs Assistance Bed Mobility: Supine to Sit;Sit to Supine     Supine to sit: +2 for physical assistance;Total assist Sit to supine: +2 for physical assistance;Total assist   General bed mobility comments: with bed elevated and rails/pad under pt's hips used. increased time for pt mobility needed with step by step cues on sequencing and technique.  Transfers                 General transfer comment: pt not safe to transfer  Ambulation/Gait                Stairs            Wheelchair  Mobility    Modified Rankin (Stroke Patients Only) Modified Rankin (Stroke Patients Only) Pre-Morbid Rankin Score: Moderately severe disability Modified Rankin: Severe disability     Balance Overall balance assessment: Needs assistance Sitting-balance support: Feet supported Sitting balance-Leahy Scale: Zero Sitting balance - Comments: Mod>max assist to maintain upright sitting EOB.  Initially worse posterior lean sitting EOB 2/2 extensor tone in Rt LE and Rt LE not supported. Postural control: Posterior lean;Right lateral lean                                   Pertinent Vitals/Pain Pain Assessment: Faces Faces Pain Scale: Hurts little more Pain Location: pt unable to indicate Pain Descriptors / Indicators: Moaning Pain Intervention(s): Monitored during session;Repositioned    Home Living Family/patient expects to be discharged to:: Skilled nursing facility                      Prior Function           Comments: Pt was independent prior to hip fx, had assist intermittently     Hand Dominance        Extremity/Trunk Assessment   Upper Extremity Assessment: Defer to OT evaluation RUE Deficits / Details: no functional movement noted, tone varied from increased extensor in sitting to increased flexor in supine, full PROM all areas     LUE Deficits /  Details: no functional movement noted, increased flexor tone in hand noted at end of session, positioned with wash cloths, full PROM    Lower Extremity Assessment: RLE deficits/detail;LLE deficits/detail RLE Deficits / Details: increased extensor tone upon sitting EOB and upon returning to supine in bed.  Pt minimally performing DF and Rt knee flexion (2/2 discomfort?) supine in bed, otherwise no functional movement noted. LLE Deficits / Details: increased extensor tone upon returning to supine in bed.  No functional movement noted.    Cervical / Trunk Assessment: Kyphotic (w/ forward head)   Communication   Communication:  (Per daughter no hearing deficits )  Cognition Arousal/Alertness: Lethargic Behavior During Therapy: Flat affect Overall Cognitive Status: Difficult to assess                      General Comments      Exercises Total Joint Exercises Ankle Circles/Pumps: PROM;Both;10 reps;Seated;Supine General Exercises - Lower Extremity Long Arc Quad: 10 reps;Seated;Both;PROM      Assessment/Plan    PT Assessment Patient needs continued PT services  PT Diagnosis Difficulty walking;Generalized weakness;Acute pain   PT Problem List Decreased strength;Decreased range of motion;Decreased activity tolerance;Decreased balance;Decreased mobility;Decreased coordination;Decreased cognition;Decreased knowledge of use of DME;Decreased safety awareness;Decreased knowledge of precautions;Pain  PT Treatment Interventions DME instruction;Gait training;Functional mobility training;Therapeutic activities;Therapeutic exercise;Balance training;Neuromuscular re-education;Cognitive remediation;Patient/family education;Modalities;Wheelchair mobility training   PT Goals (Current goals can be found in the Care Plan section) Acute Rehab PT Goals Patient Stated Goal: unable to state PT Goal Formulation: With patient/family Time For Goal Achievement: 02/24/15 Potential to Achieve Goals: Fair    Frequency Min 2X/week   Barriers to discharge        Co-evaluation PT/OT/SLP Co-Evaluation/Treatment: Yes Reason for Co-Treatment: Complexity of the patient's impairments (multi-system involvement) PT goals addressed during session: Mobility/safety with mobility;Balance;Strengthening/ROM OT goals addressed during session: Strengthening/ROM       End of Session   Activity Tolerance: Patient limited by lethargy Patient left: in bed;with call bell/phone within reach;with bed alarm set;with family/visitor present Nurse Communication: Mobility status;Precautions;Other (comment) (pt  moaning, unable to determine if due to pain)         Time: [id number] PT Time Calculation (min) (ACUTE ONLY): 37 min   Charges:   PT Evaluation $Initial PT Evaluation Tier I: 1 Procedure     PT G Codes:       Michail JewelsAshley [name] PT, DPT [phone] Pager: [phone] 02/03/2015, 1:40 PM

## 2015-02-03 NOTE — Progress Notes (Signed)
Nutrition Brief Note  Chart reviewed due to low Braden score. Per review of progress notes, patient has a poor prognosis, given her advanced age, multiple comorbidities, MRI findings and lack of improvement despite correction of blood sugars. Patient's daughter would like to continue work-up for CVA, but would like to focus on comfort for the patient. Palliative Care Team has been consulted. No nutrition interventions warranted at this time. Please consult as needed.    Sara CourtsKimberly Amparo [name], RD, LDN, CNSC Pager [phone] After Hours Pager [phone]

## 2015-02-03 NOTE — Progress Notes (Addendum)
Echocardiogram 2D Echocardiogram has been performed.  [name], Shamarr Faucett M 02/03/2015, 10:19 AM

## 2015-02-03 NOTE — Progress Notes (Signed)
Paged sharon biby NP regarding cbg [phone], Dr Roda ShuttersXu wanted  To be notified if sugar was above 120. Will continue to monitor.

## 2015-02-03 NOTE — Progress Notes (Addendum)
STROKE TEAM PROGRESS NOTE   HISTORY [name] is a 81 y.o. female was last in her normal state of health last evening (LKW 02/01/2015 at 1900). This morning, she was drowsy and lethargic in the nursing home she did not improve. According to notes, glucose the morning of 11/22 was 188, but then she did not have lunch or breakfast CBG was then 54. She was given glucagon and dextrose. EMS was called and checked her glucose and found to be low(36). She was 126 and the first one we have documented here. She was started on D50 and has a glucose the 70s to 80s.  An MRI was obtained which shows diffuse hazy restricted diffusion throughout the subcortical white matter. In addition there are multifocal punctate cortical areas of restricted diffusion.  Patient was not administered TPA secondary to  no, out of window. She was admitted to step down for further evaluation and treatment.   SUBJECTIVE (INTERVAL HISTORY) Her daughter along with palliative care MD and EEG technician are at the bedside.  Daughter reports to palliative there are siblings that are aware of her wishes. They agree to see how she does over the next few before making a long-term treatment decision.   OBJECTIVE Temp:  [97.1 F (36.2 C)-99 F (37.2 C)] 98.2 F (36.8 C) (11/23 0742) Pulse Rate:  [82-106] 83 (11/23 0742) Cardiac Rhythm:  [-] Normal sinus rhythm (11/23 0742) Resp:  [16-27] 19 (11/23 0742) BP: (106-157)/(53-127) 133/64 mmHg (11/23 0742) SpO2:  [95 %-100 %] 100 % (11/23 0742) Weight:  [67.813 kg (149 lb 8 oz)] 67.813 kg (149 lb 8 oz) (11/22 2053)  CBC:   Recent Labs Lab 01/30/15 0300 02/02/15 1558  WBC 7.2 9.2  NEUTROABS  --  8.0*  HGB 9.1* 9.6*  HCT 27.9* 30.2*  MCV 93.0 92.1  PLT 253 460*    Basic Metabolic Panel:  Recent Labs Lab 01/28/15 0640  01/30/15 0300 02/02/15 1558  NA 137  < > 134* 136  K 3.4*  < > 4.9 5.1  CL 106  < > 104 103  CO2 22  < > 24 22  GLUCOSE 111*  < > 199* 147*  BUN  15  < > 23* 24*  CREATININE 1.19*  < > 1.27* 1.28*  CALCIUM 8.6*  < > 8.6* 9.3  MG 1.6*  --   --   --   < > = values in this interval not displayed.  Lipid Panel:     Component Value Date/Time   CHOL 102 02/03/2015 0445   TRIG 105 02/03/2015 0445   TRIG 139 03/12/2006 0832   HDL 27* 02/03/2015 0445   CHOLHDL 3.8 02/03/2015 0445   CHOLHDL 3.3 CALC 03/12/2006 0832   VLDL 21 02/03/2015 0445   LDLCALC 54 02/03/2015 0445   HgbA1c:  Lab Results  Component Value Date   HGBA1C 8.4* 01/26/2015   Urine Drug Screen:     Component Value Date/Time   LABOPIA * 09/06/2009 0037    POSITIVE (NOTE) Result repeated and verified. Sent for confirmatory testing   COCAINSCRNUR NEGATIVE 09/06/2009 0037   LABBENZ NEGATIVE 09/06/2009 0037   AMPHETMU NEGATIVE 09/06/2009 0037      IMAGING  Dg Chest 2 View 02/02/2015  No active cardiopulmonary disease.   Ct Head Wo Contrast 02/02/2015   No acute intracranial process. Chronic small vessel ischemic changes.  Mr Maxine Glenn Head Wo Contrast 02/03/2015   1. Diffuse medium and small vessel disease. The appearance is exaggerated by  significant patient motion. 2. Atherosclerotic changes within the cavernous internal carotid arteries. 3. No other significant proximal stenosis, aneurysm, or branch vessel occlusion.   Mr Brain Wo Contrast 02/02/2015   Multiple small focal areas of restricted diffusion in both cerebral hemispheres and the left cerebellum compatible with acute infarct. Atrophy and mild chronic microvascular ischemia. Patchy diffuse diffusion hyperintensity throughout the parietal white matter bilaterally is likely related to the patient's hypoglycemia. This may be reversible.   2D Echocardiogram  - Left ventricle: The cavity size was normal. Wall thickness wasnormal. Systolic function was normal. The estimated ejectionfraction was in the range of 55% to 60%. - Aortic valve: There was mild stenosis. There was mildregurgitation. Valve area (VTI):  1.44 cm^2. Valve area (Vmax):1.42 cm^2. Valve area (Vmean): 1.18 cm^2. - Mitral valve: There was mild regurgitation. - Atrial septum: No defect or patent foramen ovale was identified.   PHYSICAL EXAM Physical exam  Temp:  [98.1 F (36.7 C)-99 F (37.2 C)] 98.1 F (36.7 C) (11/23 1534) Pulse Rate:  [81-106] 89 (11/23 1200) Resp:  [16-27] 21 (11/23 1200) BP: (106-147)/(53-72) 122/61 mmHg (11/23 1200) SpO2:  [95 %-100 %] 98 % (11/23 1200) Weight:  [149 lb 8 oz (67.813 kg)] 149 lb 8 oz (67.813 kg) (11/22 2053)  General - obese, well developed, in apparent acute distress, constant moaning with bowel movement.  Ophthalmologic - Fundi not visualized due to noncooperation.  Cardiovascular - Regular rate and rhythm.  Neuro - encephalopathic, eyes closed, moaning with bowel movement, not following commands, does not speak. PERRL, doll's eyes present, positive corneal and gag reflexes. Facial symmetrical, tongue in middle. On pain stimulation, BUE 3/5, BLE 4/5, symmetrical. Responding to pain bilaterally and symmetrically. DTRs 1+ and no Babinski. Gait and coordination not able to test.  ASSESSMENT/PLAN [name] is a 81 y.o. female with history of recent hip replacement surgery, coronary artery disease status post CABG, essential hypertension, diabetes mellitus, dementia, peripheral vascular disease presenting with altered mental status. She did not receive IV t-PA due to delay in arrival.   Marchiafava-Bignami Disease with acute encephalopathy  Most likely secondary to hypoglycemia  Resultant  Acute encephalopathy  MRI  demyelination of b/l corpus callosum and subcortical white matter  EEG pending   Glucose management as per primary team  Supportive care  Palliative care on board  If pt getting stabilized and improved, repeat MRI in 1-2 weeks.   Incidental stroke - punctate infarcts in right frontal lobe, left parietal lobe, and left cerebellum, likely synchronized  small vessel events vs. cardioembolic source  MRI showed punctate infarcts in right frontal, left parietal, and left cerebellum  MRA  No large vessel significant stenosis   Carotid Doppler  pending   2D Echo  No source of embolus   LDL 54  HgbA1c 8.7, not at goal  Could be related to hypoglycemia events triggering focal ischemia  Not able to rule out cardioembolic infarcts  If pt getting stabilized and improved, may consider 30 day cardiac event monitoring as outpt  Heparin 5000 units sq tid for VTE prophylaxis Diet NPO time specified. May need to discussed alternative feeding methods depending on pt progress, plan of care  aspirin 325 mg daily and clopidogrel 75 mg daily prior to admission, now on aspirin 300 mg suppository daily due to NPO status  Therapy recommendations:  SNF  Disposition:  pending   Hypertension  Stable  Hyperlipidemia  Home meds:  lipitor 20  LDL 54, goal < 70  Resume  lipitor if/when able to swallow  Continue statin at discharge based on determined plan of care  Hypoglycemia  Hx Diabetes  HgbA1c 8.7  Uncontrolled  On D5NS  Glucose control per primary team.  Other Stroke Risk Factors  Advanced age  Coronary artery disease s/p CABG  Other Active Problems  Left femoral neck fracture s/p hemiarthroplasty 01/28/2015  Hospital day # 1  [facility] See Amion for Pager information 02/03/2015 2:12 PM   I, the attending vascular neurologist, have personally obtained a history, examined the patient, evaluated laboratory data, individually viewed imaging studies and agree with radiology interpretations. I obtained additional history from pt's daughter at bedside. I also discussed with Dr. Vanessa Barbara regarding her care plan. Together with the NP/PA, we formulated the assessment and plan of care which reflects our mutual decision.  I have made any additions or clarifications directly to the above note and agree with  the findings and plan as currently documented.   81 year old female with history of CAD status post CABG, HTN, HLD, DM admitted for altered mental status and hypoglycemia. Patient on exam showed encephalopathy. MRI consistent with Marchiafava-bignami disease likely due to hypoglycemic event. Her MRI also showed incidental multiple punctate infarcts, either synchronized small vessel events in the setting of hypoglycemia or cardioembolic infarcts. Currently palliative care involved regarding further improvement, if patient does getting better and improved, recommend 30 day cardiac event monitoring and repeat MRI in about 2 weeks. Continue aspirin suppository due to nothing by mouth. An EEG and carotid Doppler.  The patient is encephalopathic and at risk for recurrent strokes and TIAs and neurological worsening and she needs ongoing stroke evaluation and aggressive risk factor modification.  Marvel Plan, MD PhD Stroke Neurology 02/03/2015 3:34 PM       To contact Stroke Continuity provider, please refer to [email or web address]. After hours, contact General Neurology

## 2015-02-04 DIAGNOSIS — I6309 Cerebral infarction due to thrombosis of other precerebral artery: Secondary | ICD-10-CM

## 2015-02-04 LAB — GLUCOSE, CAPILLARY
GLUCOSE-CAPILLARY: 134 mg/dL — AB (ref 65–99)
GLUCOSE-CAPILLARY: 143 mg/dL — AB (ref 65–99)
GLUCOSE-CAPILLARY: 165 mg/dL — AB (ref 65–99)
GLUCOSE-CAPILLARY: 179 mg/dL — AB (ref 65–99)
GLUCOSE-CAPILLARY: 201 mg/dL — AB (ref 65–99)
Glucose-Capillary: 124 mg/dL — ABNORMAL HIGH (ref 65–99)

## 2015-02-04 LAB — HEMOGLOBIN A1C
HEMOGLOBIN A1C: 8 % — AB (ref 4.8–5.6)
MEAN PLASMA GLUCOSE: 183 mg/dL

## 2015-02-04 LAB — COMPREHENSIVE METABOLIC PANEL
ALBUMIN: 1.9 g/dL — AB (ref 3.5–5.0)
ALK PHOS: 67 U/L (ref 38–126)
ALT: 7 U/L — ABNORMAL LOW (ref 14–54)
ANION GAP: 9 (ref 5–15)
AST: 23 U/L (ref 15–41)
BILIRUBIN TOTAL: 0.6 mg/dL (ref 0.3–1.2)
BUN: 14 mg/dL (ref 6–20)
CALCIUM: 8.6 mg/dL — AB (ref 8.9–10.3)
CO2: 24 mmol/L (ref 22–32)
Chloride: 106 mmol/L (ref 101–111)
Creatinine, Ser: 0.99 mg/dL (ref 0.44–1.00)
GFR calc Af Amer: 59 mL/min — ABNORMAL LOW (ref >60)
GFR calc non Af Amer: 51 mL/min — ABNORMAL LOW (ref >60)
GLUCOSE: 142 mg/dL — AB (ref 65–99)
Potassium: 3.6 mmol/L (ref 3.5–5.1)
Sodium: 139 mmol/L (ref 135–145)
TOTAL PROTEIN: 5.8 g/dL — AB (ref 6.5–8.1)

## 2015-02-04 LAB — CBC
HCT: 25.8 % — ABNORMAL LOW (ref 36.0–46.0)
HEMOGLOBIN: 8.3 g/dL — AB (ref 12.0–15.0)
MCH: 29.6 pg (ref 26.0–34.0)
MCHC: 32.2 g/dL (ref 30.0–36.0)
MCV: 92.1 fL (ref 78.0–100.0)
Platelets: 469 10*3/uL — ABNORMAL HIGH (ref 150–400)
RBC: 2.8 MIL/uL — ABNORMAL LOW (ref 3.87–5.11)
RDW: 13.8 % (ref 11.5–15.5)
WBC: 7.7 10*3/uL (ref 4.0–10.5)

## 2015-02-04 MED ORDER — MORPHINE SULFATE (PF) 2 MG/ML IV SOLN
2.0000 mg | INTRAVENOUS | Status: DC | PRN
  Administered 2015-02-04 – 2015-02-05 (×5): 2 mg via INTRAVENOUS
  Filled 2015-02-04 (×5): qty 1

## 2015-02-04 NOTE — Progress Notes (Addendum)
STROKE TEAM PROGRESS NOTE   SUBJECTIVE (INTERVAL HISTORY) No family at present. Pt seem less moaning today and calmer. No significant change of mental status. Glucose much better over night, stabilized.  OBJECTIVE Temp:  [97.8 F (36.6 C)-98.8 F (37.1 C)] 98.8 F (37.1 C) (11/24 0800) Pulse Rate:  [85-97] 88 (11/24 0800) Cardiac Rhythm:  [-] Normal sinus rhythm (11/24 0800) Resp:  [17-21] 18 (11/24 0800) BP: (122-139)/(54-66) 139/54 mmHg (11/24 0800) SpO2:  [95 %-100 %] 98 % (11/24 0800)  CBC:   Recent Labs Lab 01/30/15 0300 02/02/15 1558  WBC 7.2 9.2  NEUTROABS  --  8.0*  HGB 9.1* 9.6*  HCT 27.9* 30.2*  MCV 93.0 92.1  PLT 253 460*    Basic Metabolic Panel:   Recent Labs Lab 01/30/15 0300 02/02/15 1558  NA 134* 136  K 4.9 5.1  CL 104 103  CO2 24 22  GLUCOSE 199* 147*  BUN 23* 24*  CREATININE 1.27* 1.28*  CALCIUM 8.6* 9.3    Lipid Panel:     Component Value Date/Time   CHOL 102 02/03/2015 0445   TRIG 105 02/03/2015 0445   TRIG 139 03/12/2006 0832   HDL 27* 02/03/2015 0445   CHOLHDL 3.8 02/03/2015 0445   CHOLHDL 3.3 CALC 03/12/2006 0832   VLDL 21 02/03/2015 0445   LDLCALC 54 02/03/2015 0445   HgbA1c:  Lab Results  Component Value Date   HGBA1C 8.0* 02/03/2015   Urine Drug Screen:     Component Value Date/Time   LABOPIA * 09/06/2009 0037    POSITIVE (NOTE) Result repeated and verified. Sent for confirmatory testing   COCAINSCRNUR NEGATIVE 09/06/2009 0037   LABBENZ NEGATIVE 09/06/2009 0037   AMPHETMU NEGATIVE 09/06/2009 0037      IMAGING I have personally reviewed the radiological images below and agree with the radiology interpretations.  Dg Chest 2 View 02/02/2015  No active cardiopulmonary disease.   Ct Head Wo Contrast 02/02/2015   No acute intracranial process. Chronic small vessel ischemic changes.  Mr [name] Head Wo Contrast 02/03/2015   1. Diffuse medium and small vessel disease. The appearance is exaggerated by significant  patient motion. 2. Atherosclerotic changes within the cavernous internal carotid arteries. 3. No other significant proximal stenosis, aneurysm, or branch vessel occlusion.   Mr Brain Wo Contrast 02/02/2015   Multiple small focal areas of restricted diffusion in both cerebral hemispheres and the left cerebellum compatible with acute infarct. Atrophy and mild chronic microvascular ischemia. Patchy diffuse diffusion hyperintensity throughout the parietal white matter bilaterally is likely related to the patient's hypoglycemia. This may be reversible.   2D Echocardiogram  - Left ventricle: The cavity size was normal. Wall thickness wasnormal. Systolic function was normal. The estimated ejectionfraction was in the range of 55% to 60%. - Aortic valve: There was mild stenosis. There was mildregurgitation. Valve area (VTI): 1.44 cm^2. Valve area (Vmax):1.42 cm^2. Valve area (Vmean): 1.18 cm^2. - Mitral valve: There was mild regurgitation. - Atrial septum: No defect or patent foramen ovale was identified.  EEG is abnormal due to moderate diffuse slowing of the waking background with triphasic-like waves seen  CUS - pending  PHYSICAL EXAM General - obese, well developed, in mild acute distress with pain stimulation, less moaning than yesterday.  Ophthalmologic - Fundi not visualized due to noncooperation.  Cardiovascular - Regular rate and rhythm.  Neuro - encephalopathic, eyes closed, briefly open with pain stimulation, also moaning with pain stimulation, not following commands, no language output. PERRL, doll's eyes present, positive  corneal and gag reflexes. Facial symmetrical, tongue in middle. On pain stimulation, BUE 3/5, RLE 4/5, LLE 2+/5. Responding to pain stimulation bilaterally. DTRs 1+ and no Babinski. Gait and coordination not able to test.   ASSESSMENT/PLAN [name] is a [age] y.o. female with history of recent hip replacement surgery, coronary artery disease status post  CABG, essential hypertension, diabetes mellitus, dementia, peripheral vascular disease presenting with altered mental status. She did not receive IV t-PA due to delay in arrival.   Marchiafava-Bignami Disease with acute encephalopathy  Most likely secondary to hypoglycemia  Resultant  Acute encephalopathy  MRI  demyelination of b/l corpus callosum and subcortical white matter  EEG moderate diffuse slowing with triphasic-like waves  Glucose management as per primary team  Supportive care  Palliative care on board  If pt getting stabilized and improved, repeat MRI in 1-2 weeks.   Palliative care meeting set for tomorrow to discuss progress, current and future plan of care  Incidental stroke - punctate infarcts in right frontal lobe, left parietal lobe, and left cerebellum, likely synchronized small vessel events vs. cardioembolic source  MRI showed punctate infarcts in right frontal, left parietal, and left cerebellum  MRA  No large vessel significant stenosis   Carotid Doppler  pending   2D Echo  No source of embolus   LDL 54  HgbA1c 8.7, not at goal  Could be related to hypoglycemia events triggering focal ischemia  Not able to rule out cardioembolic infarcts  If pt getting stabilized and improved, may consider 30 day cardiac event monitoring as outpt  Heparin 5000 units sq tid for VTE prophylaxis Diet NPO time specified. May need to discussed alternative feeding methods depending on pt progress, plan of care  aspirin 325 mg daily and clopidogrel 75 mg daily prior to admission, now on aspirin 300 mg suppository daily due to NPO status  Therapy recommendations:  SNF  Disposition:  pending   Hypertension  Stable  Hyperlipidemia  Home meds:  lipitor 20  LDL 54, goal < 70  Resume lipitor if/when able to swallow  Continue statin at discharge based on determined plan of care  Hypoglycemia  Hx Diabetes  HgbA1c 8.7  Uncontrolled  On D5NS  Glucose  control per primary team.  Other Stroke Risk Factors  Advanced age  Coronary artery disease s/p CABG  Other Active Problems  Left femoral neck fracture s/p hemiarthroplasty 01/28/2015 - explains LLE weakness comparing with RLE  Hospital day # 1  The patient is still encephalopathic and at risk for recurrent strokes and TIAs and neurological worsening due to diffuse WM changes and she needs ongoing stroke evaluation and aggressive risk factor modification.  Sara PlanJindong Sundeep Destin, MD PhD Stroke Neurology 02/04/2015 12:00 PM    To contact Stroke Continuity provider, please refer to [email or web address]. After hours, contact General Neurology

## 2015-02-04 NOTE — Plan of Care (Signed)
Problem: Education: Goal: Knowledge of disease or condition will improve Outcome: Not Progressing Patient is nonverbal. Family made aware of current prognosis- not accepting.  Problem: Self-Care: Goal: Ability to participate in self-care as condition permits will improve Outcome: Not Progressing Patient unable to participate in ADLs, only responds to pain.

## 2015-02-04 NOTE — Progress Notes (Signed)
I met briefly with the patient's daughter, Helene Kelp, in the room today.  She reports that her mother has been resting comfortably.  She did receive regular doses of IV morphine overnight, but has not gotten any for the last several hours and is resting comfortably at the time of my exam.  Moaning decreased, but no other significant changes on my exam today.  Family is concerned about eventual transition of patient to general floor as they report that the last time she was hospitalized she was "sent out too early and look what happened."    I confirmed time for family meeting tomorrow at 11:00 to continue discussion regarding goals of care.    Micheline Rough, MD [facility] [phone]

## 2015-02-04 NOTE — Progress Notes (Signed)
[facility] PROGRESS NOTE  [name] RSW:[id number] DOB: 08-15-1928 DOA: 02/02/2015 PCP: Gaye Alken, MD  Assessment/Plan: Marchiafava-Bignami Disease with acute encephalopathy -[name] is an 81 year old with multiple comorbidities including hypertension, diabetes mellitus, solid history of coronary artery disease found minimally responsive by nursing home staff. She was found to be hypoglycemic with blood sugars in the 30s. -She was initially worked up with an MRI of brain that revealed multiple small focal areas of restricted diffusion in both cerebral hemispheres and the left cerebellum compatible with acute infarct. MRI demyelination of b/l corpus callosum and subcortical white matter -Patient was seen and evaluated by neurology overnight. Changes on MRI could be consistent with hypoglycemic brain injury. Other possibilities include thromboembolic phenomena.  Continue CVA protocol, further workup with Carotid Doppler pending  2D Echo No source of embolus   LDL 54 HgbA1c 8.7, not at goal-Continue antiplatelet therapy with aspirin. -Neurology  recommends a 30 day cardiac event monitor the patient improves, Patient remains NPO, high risk for aspiration, continue rectal aspirin If patient progresses, neurology recommends aspirin 325+ Plavix 75 mg Palliative care on board and if the patient does not improve in the next 24 hours: Then goal is Comfort care  2.  Hypoglycemia with history of diabetes mellitus. -Patient with history of diabetes mellitus had been on Januvia 100 mg by mouth every daily, metformin 500 mg daily at bedtime and 1000 mg every morning, and glimeperide 4 mg by mouth daily per discharge summary of 01/30/2015.  -She presented with hypoglycemia requiring treatment with IV dextrose. -Has mentioned above changes on MRI could reflect hypoglycemic brain injury -Blood sugar stabilizes morning, we'll continue to monitor. -Will change Accu-Cheks to  every 4 hours Hemoglobin A1c 8.7   3.  Acute encephalopathy secondary to #1 -Likely related to hypoglycemia as she was found to have blood sugars in the 30s at a skilled nursing facility. -This could've also been related to acute CVA -On this mornings assessment she remains minimally responsive, I do not observe purposeful movements.   4.  History of left femoral neck fracture -Status post hemiarthroplasty on 01/28/2015  5.  Goals of care -I spoke with her daughter [name] regarding medical goals of care for her mother. I explained MRI findings and ongoing poor responsiveness this morning.  With her advanced age, multiple comorbidities, MRI findings and lack of improvement despite correction of blood sugars, prognosis remains poor. She stated that the priority here was focusing on her mother's comfort. She would like her mother to complete CVA workup with MRA, transthoracic echocardiogram, carotid Dopplers. If no improvement in the next 24 hours, then focus on strictly comfort care, residential hospice, meeting on 11/25 at 11 AM   Code Status: DO NOT RESUSCITATE  Family Communication: I spoke with her daughter this morning over telephone  Disposition Plan: Plan to complete CVA workup, pending palliative care consultation. Poor prognosis.   Consultants:  Neurology   HPI/  [name] 's an 81 year old female with a past medical history of coronary artery disease, hypertension, diabetes mellitus, carotid artery disease, recently admitted from 01/26/2015 through 01/30/2015 where she was treated for left femoral neck fracture, undergoing hemiarthroplasty on 01/28/2015. She was discharged to skilled nursing facility for acute rehabilitation. She was transferred to the emergency department on 02/02/2015 found unresponsive by nursing home staff. She was found to be hypoglycemic with blood sugars in the 30s. She was given IV dextrose. Overnight an MRI revealed multiple small focal areas  involving bilateral cerebral hemispheres and  left cerebellum have an appearance of acute CVA. She was evaluated by neurology who felt that changes on MRI could also be consistent with consequences of hypoglycemia.  Subjective-family not present by the bedside, patient barely arousable  Objective: Filed Vitals:   02/04/15 0400 02/04/15 0800  BP: 133/56 139/54  Pulse: 88 88  Temp: 97.8 F (36.6 C) 98.8 F (37.1 C)  Resp: 19 18    Intake/Output Summary (Last 24 hours) at 02/04/15 1134 Last data filed at 02/04/15 1000  Gross per 24 hour  Intake   1200 ml  Output      0 ml  Net   1200 ml   Filed Weights   02/02/15 2053  Weight: 67.813 kg (149 lb 8 oz)    Exam:   General:  Patient is minimally responsive, cannot open her eyes to command, I cannot elicit any purposeful movement   Cardiovascular: 2/6 systolic ejection murmur   Respiratory: Normal respiratory effort, lungs are clear to auscultation   Abdomen: Soft nontender nondistended   Musculoskeletal: Suspected deep tissue injury to sacral decub  Neurological: I cannot perform adequate neurologic examination given unresponsiveness. She does not have purposeful movements, cannot follow commands nor open her eyes to command.  Data Reviewed: Basic Metabolic Panel:  Recent Labs Lab 01/29/15 0412 01/30/15 0300 02/02/15 1558 02/04/15 0845  NA 137 134* 136 139  K 4.5 4.9 5.1 3.6  CL 107 104 103 106  CO2 GLUCOSE 95 199* 147* 142*  BUN 15 23* 24* 14  CREATININE 1.21* 1.27* 1.28* 0.99  CALCIUM 8.2* 8.6* 9.3 8.6*   Liver Function Tests:  Recent Labs Lab 02/02/15 1558 02/04/15 0845  AST 30 23  ALT 10* 7*  ALKPHOS 83 67  BILITOT 0.4 0.6  PROT 6.8 5.8*  ALBUMIN 2.1* 1.9*   No results for input(s): LIPASE, AMYLASE in the last 168 hours.  Recent Labs Lab 02/03/15 0311  AMMONIA 21   CBC:  Recent Labs Lab 01/29/15 0412 01/30/15 0300 02/02/15 1558 02/04/15 0845  WBC 8.0 7.2 9.2 7.7   NEUTROABS  --   --  8.0*  --   HGB 8.7* 9.1* 9.6* 8.3*  HCT 27.8* 27.9* 30.2* 25.8*  MCV 93.3 93.0 92.1 92.1  PLT 235 253 460* 469*   Cardiac Enzymes:  Recent Labs Lab 02/02/15 1558  TROPONINI <0.03   BNP (last 3 results)  Recent Labs  02/02/15 1558  BNP 235.2*    ProBNP (last 3 results) No results for input(s): PROBNP in the last 8760 hours.  CBG:  Recent Labs Lab 02/03/15 1748 02/03/15 2041 02/03/15 2309 02/04/15 0356 02/04/15 0800  GLUCAP 170* 180* 134* 124* 134*    Recent Results (from the past 240 hour(s))  MRSA PCR Screening     Status: None   Collection Time: 02/03/15 12:03 AM  Result Value Ref Range Status   MRSA by PCR NEGATIVE NEGATIVE Final    Comment:        The GeneXpert MRSA Assay (FDA approved for NASAL specimens only), is one component of a comprehensive MRSA colonization surveillance program. It is not intended to diagnose MRSA infection nor to guide or monitor treatment for MRSA infections.      Studies: Dg Chest 2 View  02/02/2015  CLINICAL DATA:  Altered mental status. EXAM: CHEST  2 VIEW COMPARISON:  01/26/2015 FINDINGS: Prior CABG. Heart and mediastinal contours are within normal limits. No focal opacities or effusions. No acute bony abnormality. IMPRESSION: No  active cardiopulmonary disease. Electronically Signed   By: Charlett Nose M.D.   On: 02/02/2015 16:30   Ct Head Wo Contrast  02/02/2015  CLINICAL DATA:  Patient with recent hip replacement. Decreased level of consciousness. EXAM: CT HEAD WITHOUT CONTRAST TECHNIQUE: Contiguous axial images were obtained from the base of the skull through the vertex without intravenous contrast. COMPARISON:  Brain CT 11/17/2011; MR brain 09/18/2014. FINDINGS: Ventricles and sulci are appropriate for patient's age. Periventricular and subcortical white matter hypodensity compatible with chronic small vessel ischemic changes. No evidence for acute cortically based infarct, intracranial hemorrhage,  mass lesion or mass effect. Orbits are unremarkable. Paranasal sinuses are well aerated. Calvarium is intact. IMPRESSION: No acute intracranial process. Chronic small vessel ischemic changes. Electronically Signed   By: Annia Belt M.D.   On: 02/02/2015 16:44   Mr Maxine Glenn Head Wo Contrast  02/03/2015  CLINICAL DATA:  Unresponsive. Not following commands. Multiple acute infarcts. EXAM: MRA HEAD WITHOUT CONTRAST TECHNIQUE: Angiographic images of the Circle of Willis were obtained using MRA technique without intravenous contrast. COMPARISON:  MRI brain 02/02/2015 and 09/18/2014. FINDINGS: The study is moderately degraded by patient motion. Atherosclerotic changes are present within the cavernous internal carotid arteries bilaterally without a significant stenosis. The A1 and M1 segments are normal. The anterior communicating artery is patent. The MCA bifurcations are intact. There is marked attenuation of distal MCA branch vessels bilaterally. This is likely exaggerated by patient motion. The right vertebral artery is the dominant vessel. There is mild narrowing of the distal left vertebral artery. The basilar artery is intact. Both posterior cerebral arteries originate from the basilar tip. There is significant signal loss in the proximal PCA vessels bilaterally suggesting moderate stenoses. IMPRESSION: 1. Diffuse medium and small vessel disease. The appearance is exaggerated by significant patient motion. 2. Atherosclerotic changes within the cavernous internal carotid arteries. 3. No other significant proximal stenosis, aneurysm, or branch vessel occlusion. Electronically Signed   By: Marin Roberts M.D.   On: 02/03/2015 09:26   Mr Brain Wo Contrast  02/02/2015  CLINICAL DATA:  Acute encephalopathy.  Hypoglycemia EXAM: MRI HEAD WITHOUT CONTRAST TECHNIQUE: Multiplanar, multiecho pulse sequences of the brain and surrounding structures were obtained without intravenous contrast. COMPARISON:  MRI 09/18/2014  FINDINGS: Multiple small areas of restricted diffusion compatible with acute infarct. These all measure under 1 cm in are present in the right frontal lobe, left parietal lobe, and left cerebellum. The cerebral white matter and internal capsule bilaterally is diffusely hyperintense on diffusion-weighted imaging. This shows low signal on ADC map. This not felt to represent acute infarct but could be related to is leukoencephalopathy secondary to hypoglycemia. Diffusion-weighted imaging was normal on the prior study. On today's study, the diffusion-weighted abnormality in the white matter is much more extensive than that seen on T2 or FLAIR Mild atrophy. Mild chronic microvascular ischemic change in the white matter and pons. Negative for hemorrhage or mass lesion. No shift of the midline structures. IMPRESSION: Multiple small focal areas of restricted diffusion in both cerebral hemispheres and the left cerebellum compatible with acute infarct. Atrophy and mild chronic microvascular ischemia. Patchy diffuse diffusion hyperintensity throughout the parietal white matter bilaterally is likely related to the patient's hypoglycemia. This may be reversible. Follow-up MRI suggested. Electronically Signed   By: Marlan Palau M.D.   On: 02/02/2015 20:40    Scheduled Meds: . antiseptic oral rinse  7 mL Mouth Rinse q12n4p  . aspirin  300 mg Rectal Daily   Or  .  aspirin  325 mg Oral Daily  . chlorhexidine  15 mL Mouth Rinse BID  . heparin  5,000 Units Subcutaneous 3 times per day  . insulin aspart  0-9 Units Subcutaneous 6 times per day  . pantoprazole (PROTONIX) IV  40 mg Intravenous Q24H   Continuous Infusions: . dextrose 5 % and 0.9% NaCl 50 mL/hr at 02/04/15 0700    Principal Problem:   Acute encephalopathy Active Problems:   CAD- CABG '00. Cath in '09 and 05/29/13- patent grafts   Hypertension   Dyslipidemia   Depression   Dementia without behavioral disturbance   Fracture of femoral neck, left  (HCC)   Pressure ulcer   CVA (cerebral infarction)   Marchiafava-Bignami disease (HCC)   Goals of care, counseling/discussion   Palliative care encounter    Time spent: 40 min    [facility] NorthportBROL,[name]  [facility] Pager [phone] If 7PM-7AM, please contact night-coverage at [email or web address], password [email or web address] 02/04/2015, 11:34 AM  LOS: 1 day

## 2015-02-05 ENCOUNTER — Inpatient Hospital Stay (HOSPITAL_COMMUNITY): Payer: Medicare Other

## 2015-02-05 DIAGNOSIS — Z515 Encounter for palliative care: Secondary | ICD-10-CM

## 2015-02-05 LAB — GLUCOSE, CAPILLARY
GLUCOSE-CAPILLARY: 171 mg/dL — AB (ref 65–99)
Glucose-Capillary: 147 mg/dL — ABNORMAL HIGH (ref 65–99)
Glucose-Capillary: 180 mg/dL — ABNORMAL HIGH (ref 65–99)

## 2015-02-05 MED ORDER — MORPHINE SULFATE 25 MG/ML IV SOLN
1.0000 mg/h | INTRAVENOUS | Status: DC
  Administered 2015-02-05: 1 mg/h via INTRAVENOUS
  Filled 2015-02-05: qty 10

## 2015-02-05 MED ORDER — HALOPERIDOL LACTATE 5 MG/ML IJ SOLN: 0.5000 mg | INTRAMUSCULAR | Status: DC | PRN

## 2015-02-05 MED ORDER — LORAZEPAM 1 MG PO TABS: 1.0000 mg | ORAL_TABLET | ORAL | Status: DC | PRN

## 2015-02-05 MED ORDER — GLYCOPYRROLATE 0.2 MG/ML IJ SOLN
0.2000 mg | INTRAMUSCULAR | Status: DC | PRN
  Filled 2015-02-05: qty 1

## 2015-02-05 MED ORDER — GLYCOPYRROLATE 1 MG PO TABS
1.0000 mg | ORAL_TABLET | ORAL | Status: DC | PRN
  Filled 2015-02-05: qty 1

## 2015-02-05 MED ORDER — LORAZEPAM 2 MG/ML IJ SOLN: 1.0000 mg | INTRAMUSCULAR | Status: DC | PRN

## 2015-02-05 MED ORDER — MORPHINE BOLUS VIA INFUSION
2.0000 mg | INTRAVENOUS | Status: DC | PRN
  Administered 2015-02-05 – 2015-02-06 (×6): 2 mg via INTRAVENOUS
  Filled 2015-02-05 (×7): qty 2

## 2015-02-05 MED ORDER — HALOPERIDOL LACTATE 2 MG/ML PO CONC
0.5000 mg | ORAL | Status: DC | PRN
  Filled 2015-02-05: qty 0.3

## 2015-02-05 MED ORDER — HALOPERIDOL 0.5 MG PO TABS
0.5000 mg | ORAL_TABLET | ORAL | Status: DC | PRN
  Filled 2015-02-05: qty 1

## 2015-02-05 MED ORDER — BISACODYL 10 MG RE SUPP: 10.0000 mg | Freq: Every day | RECTAL | Status: DC | PRN

## 2015-02-05 MED ORDER — LORAZEPAM 2 MG/ML PO CONC: 1.0000 mg | ORAL | Status: DC | PRN

## 2015-02-05 NOTE — Progress Notes (Signed)
SLP Cancellation Note  Patient Details Name: Clement HusbandsShirley Gillette MRN: [id number] DOB: 06/04/1928   Cancelled treatment:       Reason Eval/Treat Not Completed: Fatigue/lethargy limiting ability to participate. Pt has not progressed for participation for ordered cognitive linguistic eval (stroke order set). Will sign off. Please reorder if status changes.    Kimi Bordeau, Riley NearingBonnie Caroline 02/05/2015, 8:40 AM

## 2015-02-05 NOTE — Consult Note (Signed)
[facility] The patient [facility]. She can be admitted tomorrow morning, 02/06/15 Admission documents completed by POA/daughter Sara SallesAnita [name]. Weekend Liaison, K. Dorosiewicz, RN will follow up tomorrow morning.  Nelson Sara Stevens, RN, [facility] Liaison [phone] Weekend contact number for referrals: [phone]

## 2015-02-05 NOTE — Consult Note (Signed)
WOC wound consult note Reason for Consult: sacrum Wound type: MASD (moisture associated skin damage) gluteal cleft Pressure Ulcer POA: No Measurement: 0.2cm x 0.1cm x 0.1cm  Wound bed: pink Drainage (amount, consistency, odor) none Periwound: macerated, pink but blanchable. Dressing procedure/placement/frequency: Silicone foam if can be kept dry.  Otherwise use barrier cream only.   Discussed POC with patient and bedside nurse.  Re consult if needed, will not follow at this time. Thanks  Tirth Cothron [employer]ocker[employer]in RN, CWOCN [phone])

## 2015-02-05 NOTE — Progress Notes (Signed)
Daily Progress Note   Patient Name: [name]       Date: 02/05/2015 DOB: 09/03/1928  Age: 81 y.o. MRN#: [id number] Attending Physician: Reyne Dumas, MD Primary Care Physician: Gerrit Heck, MD Admit Date: 02/02/2015  Reason for Consultation/Follow-up: Establishing goals of care, Hospice Evaluation and Terminal Care  Subjective: [name] is an 81 year old with multiple comorbidities including hypertension, diabetes mellitus, and coronary artery disease found minimally responsive by nursing home staff. Her recent history is notable for femoral neck fracture was repaired on 11/17. Following this, she had some increased sleepiness over the weekend. She was transitioned to nursing facility where she was found to be largely unresponsive just prior to admission. She was found to be hypoglycemic with blood sugars in the 30s. Her blood sugar has been corrected but she remains unresponsive and without any oral intake with MRI consistent with diffuse white matter demyelination.  Interval Events: I met with patient's daughters today including Rodena Piety, who is her POA.  We discussed Sara. [name]'s clinical course to this point in time as well as pathways moving forward.  Her daughters are in agreement that she has been very consistent in saying that she would not want any aggressive care nor would she ever want to be placed in a skilled facility.  All of her daughters are in agreement that her wish would be to focus strictly on her comfort if she were to understand her clinical condition.  They do report concerns that her pain has not been adequately controlled. We discussed use of intermittent dosing of opioids and family expressed that they would be more comfortable if she were to receive low-dose continuous infusion.  They are familiar with Hospice Home in [facility] and would like to pursue placement there for end-of-life care.  Length of Stay: 2 days  Current Medications: Scheduled  Meds:  . antiseptic oral rinse  7 mL Mouth Rinse q12n4p  . aspirin  300 mg Rectal Daily   Or  . aspirin  325 mg Oral Daily    Continuous Infusions: . morphine      PRN Meds: acetaminophen **OR** acetaminophen, bisacodyl, glycopyrrolate **OR** glycopyrrolate **OR** glycopyrrolate, haloperidol **OR** haloperidol **OR** haloperidol lactate, LORazepam **OR** LORazepam **OR** LORazepam, morphine  Palliative Performance Scale: 10%     Vital Signs: BP 151/72 mmHg  Pulse 89  Temp(Src) 98.2 F (36.8 C) (Axillary)  Resp 18  Ht 5' 7" (1.702 m)  Wt 67.813 kg (149 lb 8 oz)  BMI 23.41 kg/m2  SpO2 97% SpO2: SpO2: 97 % O2 Device: O2 Device: Not Delivered O2 Flow Rate:    Intake/output summary:  Intake/Output Summary (Last 24 hours) at 02/05/15 1236 Last data filed at 02/05/15 0400  Gross per 24 hour  Intake    900 ml  Output      0 ml  Net    900 ml   LBM:   Baseline Weight: Weight: 67.813 kg (149 lb 8 oz) Most recent weight: Weight: 67.813 kg (149 lb 8 oz)  Physical Exam:  General: Patient is minimally responsive, does not follow any commands, no signs of distress   Cardiovascular: Regular, systolic ejection murmur   Respiratory: Normal respiratory effort, lungs are clear with anterior auscultation   Abdomen: Soft nontender nondistended  Neurological: She does not have purposeful movements, cannot follow commands nor open her eyes to command.             Additional Data Reviewed: Recent Labs     02/02/15  1558  02/04/15  0845  WBC  9.2  7.7  HGB  9.6*  8.3*  PLT  460*  469*  NA  136  139  BUN  24*  14  CREATININE  1.28*  0.99     Problem List:  Patient Active Problem List   Diagnosis Date Noted  . Pressure ulcer 02/03/2015  . CVA (cerebral infarction)   . Marchiafava-Bignami disease ([place])   . Goals of care, counseling/discussion   . Palliative care encounter   . Acute encephalopathy 02/02/2015  . Displaced fracture of left femoral neck ([place])  01/28/2015  . Fracture of femoral neck, left ([place]) 01/26/2015  . Fall at home 01/26/2015  . Left displaced femoral neck fracture ([place]) 01/26/2015  . Faintness   . Dementia without behavioral disturbance 01/10/2015  . Aortic stenosis 06/19/2014  . Orthostatic hypotension 07/16/2013  . Myelopathy ([place]) 12/07/2011  . Depression 12/04/2011  . Fever 12/03/2011  . Pain of upper extremity 12/01/2011  . Upper extremity weakness 11/24/2011  . UTI (lower urinary tract infection) 11/18/2011  . Altered mental status 11/18/2011  . Fall 11/18/2011  . Shortness of breath   . CAD- CABG '00. Cath in '09 and 05/29/13- patent grafts   . Hx of CABG   . Ejection fraction   . Hypertension   . Palpitations   . Dyslipidemia   . Diabetes mellitus type II, uncontrolled ([place])   . Carotid artery disease ([place])   . Dizziness   . Cervical spine disease   . Hyperkalemia      Palliative Care Assessment & Plan    Code Status:  DNR  Goals of Care:  Comfort care only.  Pursuing placement at residential [facility] facility in [facility] for end-of-life care  Symptom Management: - Pain: Her family reports concern that her pain has not been adequately managed at times over the past 24 hours. We discussed use of intermittent doses of opioids and they expressed preference for continuous infusion. She did not receive any doses of morphine overnight to see if this was affecting her mental status, but she had been tolerating regular dosing over the 48 hours prior to this. Her previous intermittent dosing with 2 mg every 2 hours as needed. Will plan for initiation of morphine continuous infusion at 1 mg per hour as well as rescue dosing of 2 mg every 15 minutes as needed for signs of distress. - Nausea: Haldol or Zofran as needed - Anxiety: Ativan as needed - Agitation: Haldol as needed - Excess secretions: Robinul as needed - Constipation: Dulcolax as needed   Palliative Prophylaxis:  Bowel  regimen  Psycho-social/Spiritual:  Desire for further Chaplaincy support:no   Prognosis: < 2 weeks. Sara. [name] is an 81 year old female with significant neurologic injury who is no longer able to maintain her own nutrition and hydration. Her family is certain that she would not want to pursue artificial means of nutrition and hydration based upon her prior expressed wishes. Based upon this, her course would be less than 2 weeks. This could be complicated further if she were to suffer recurrent stroke or aspirate which could make her prognosis even shorter.  Discharge Planning: Hospice facility. Family would like to pursue placement at hospice home in [facility] for end-of-life care.   Care plan was discussed with Patient's daughters and Dr. Allyson Sabal.  Dr. Allyson Sabal notified of meeting outcome via text page.  Thank you for allowing the Palliative Medicine Team to assist in the care of this patient.  Time In: 1110 Time Out: 1200 Total Time 50 Prolonged Time Billed no    Greater than 50%  of this time was spent counseling and coordinating care related to the above assessment and plan.   Micheline Rough, MD  02/05/2015, 12:36 PM  Please contact Palliative Medicine Team phone at [phone] for questions and concerns.

## 2015-02-05 NOTE — Progress Notes (Signed)
PT Cancellation Note  Patient Details Name: Sara HusbandsShirley Anker MRN: [id number] DOB: 06/01/1928   Cancelled Treatment:    Reason Eval/Treat Not Completed: Medical issues which prohibited therapy.  Pt is not able to participate in therapy and has not progressed (per chart review) since last session.  PT is signing off.  Please place new order for PT if becomes appropriate.  Thank you.  Michail JewelsAshley Parr PT, DPT [phone] Pager: [phone] 02/05/2015, 9:47 AM

## 2015-02-05 NOTE — Clinical Social Work Note (Signed)
Clinical Social Worker received notification from Palliative Medicine MD that patient family has opted for comfort care and [facility]  Referral made and await follow up from the facility.  Potential bed available this evening or early tomorrow morning.  CSW remains available for support and to facilitate patient discharge needs.  Macario GoldsJesse Hanadi Stanly, Kentucky[place]CSW [phone]

## 2015-02-05 NOTE — Care Management Note (Signed)
Case Management Note  Patient Details  Name: Clement HusbandsShirley Wolfer MRN: [id number] Date of Birth: 09/18/1928  Subjective/Objective:                  Chief Complaint: unresponsive episode PMH:  recent hip replacement surgery, CAD post CABG,hypertension, diabetes mellitus, dementia, peripheral vascular disease.  Action/Plan: No family present at the bedside. Patient nonverbal per notes and sign on door is for "quiet time"; pt resting with eyes closed. Previously reccomended SNF placement by PT and OT but patient now nonverbal and less responsive therefore PT/OT signed off as patient not progressing and not able to participate. CM will follow for discharge disposition pending family meeting today with palliative care.   Expected Discharge Date:                  Expected Discharge Plan:     In-House Referral:     Discharge planning Services  CM Consult  Post Acute Care Choice:    Choice offered to:     DME Arranged:    DME Agency:     HH Arranged:    HH Agency:     Status of Service:  In process, will continue to follow  Medicare Important Message Given:    Date Medicare IM Given:    Medicare IM give by:    Date Additional Medicare IM Given:    Additional Medicare Important Message give by:     If discussed at Long Length of Stay Meetings, dates discussed:    Additional Comments:  Darcel SmallingAnna C Celedonio Sortino, RN 02/05/2015, 9:55 AM

## 2015-02-05 NOTE — Progress Notes (Signed)
STROKE TEAM PROGRESS NOTE   SUBJECTIVE (INTERVAL HISTORY) The patient's granddaughter was at the bedside. The patient is poorly responsive. She does not follow commands or attempt to speak. She occasionally moans. The granddaughter does not feel that the patient would want a feeding tube placed. The family is to meet with palliative care today.  OBJECTIVE Temp:  [97.5 F (36.4 C)-98.8 F (37.1 C)] 98.5 F (36.9 C) (11/25 0736) Pulse Rate:  [80-111] 89 (11/25 0736) Cardiac Rhythm:  [-] Normal sinus rhythm (11/25 0738) Resp:  [12-24] 18 (11/25 0736) BP: (129-169)/(61-113) 151/72 mmHg (11/25 0736) SpO2:  [97 %-98 %] 97 % (11/25 0736)  CBC:   Recent Labs Lab 02/02/15 1558 02/04/15 0845  WBC 9.2 7.7  NEUTROABS 8.0*  --   HGB 9.6* 8.3*  HCT 30.2* 25.8*  MCV 92.1 92.1  PLT 460* 469*    Basic Metabolic Panel:   Recent Labs Lab 02/02/15 1558 02/04/15 0845  NA 136 139  K 5.1 3.6  CL 103 106  CO2 22 24  GLUCOSE 147* 142*  BUN 24* 14  CREATININE 1.28* 0.99  CALCIUM 9.3 8.6*    Lipid Panel:     Component Value Date/Time   CHOL 102 02/03/2015 0445   TRIG 105 02/03/2015 0445   TRIG 139 03/12/2006 0832   HDL 27* 02/03/2015 0445   CHOLHDL 3.8 02/03/2015 0445   CHOLHDL 3.3 CALC 03/12/2006 0832   VLDL 21 02/03/2015 0445   LDLCALC 54 02/03/2015 0445   HgbA1c:  Lab Results  Component Value Date   HGBA1C 8.0* 02/03/2015   Urine Drug Screen:     Component Value Date/Time   LABOPIA * 09/06/2009 0037    POSITIVE (NOTE) Result repeated and verified. Sent for confirmatory testing   COCAINSCRNUR NEGATIVE 09/06/2009 0037   LABBENZ NEGATIVE 09/06/2009 0037   AMPHETMU NEGATIVE 09/06/2009 0037      IMAGING I have personally reviewed the radiological images below and agree with the radiology interpretations.  Dg Chest 2 View 02/02/2015  No active cardiopulmonary disease.   Ct Head Wo Contrast 02/02/2015   No acute intracranial process. Chronic small vessel ischemic  changes.  Mr Jodene Nam Head Wo Contrast 02/03/2015   1. Diffuse medium and small vessel disease. The appearance is exaggerated by significant patient motion. 2. Atherosclerotic changes within the cavernous internal carotid arteries. 3. No other significant proximal stenosis, aneurysm, or branch vessel occlusion.   Mr Brain Wo Contrast 02/02/2015   Multiple small focal areas of restricted diffusion in both cerebral hemispheres and the left cerebellum compatible with acute infarct. Atrophy and mild chronic microvascular ischemia. Patchy diffuse diffusion hyperintensity throughout the parietal white matter bilaterally is likely related to the patient's hypoglycemia. This may be reversible.   2D Echocardiogram  - Left ventricle: The cavity size was normal. Wall thickness wasnormal. Systolic function was normal. The estimated ejectionfraction was in the range of 55% to 60%. - Aortic valve: There was mild stenosis. There was mildregurgitation. Valve area (VTI): 1.44 cm^2. Valve area (Vmax):1.42 cm^2. Valve area (Vmean): 1.18 cm^2. - Mitral valve: There was mild regurgitation. - Atrial septum: No defect or patent foramen ovale was identified.  EEG is abnormal due to moderate diffuse slowing of the waking background with triphasic-like waves seen  CUS - pending  PHYSICAL EXAM General - obese, well developed, in mild acute distress with pain stimulation, less moaning than yesterday.  Ophthalmologic - Fundi not visualized due to noncooperation.  Cardiovascular - Regular rate and rhythm.  Neuro -  encephalopathic, eyes closed, briefly open with pain stimulation, also moaning with pain stimulation, not following commands, no language output. PERRL, doll's eyes present, positive corneal and gag reflexes. Facial symmetrical, tongue in middle. On pain stimulation, BUE 3/5, RLE 4/5, LLE 2+/5. Responding to pain stimulation bilaterally. DTRs 1+ and no Babinski. Gait and coordination not able to  test.   ASSESSMENT/PLAN [name] is a 81 y.o. female with history of recent hip replacement surgery, coronary artery disease status post CABG, essential hypertension, diabetes mellitus, dementia, peripheral vascular disease presenting with altered mental status. She did not receive IV t-PA due to delay in arrival.   Marchiafava-Bignami Disease with acute encephalopathy  Most likely secondary to hypoglycemia  Resultant  Acute encephalopathy  MRI  demyelination of b/l corpus callosum and subcortical white matter  EEG moderate diffuse slowing with triphasic-like waves  Glucose management as per primary team  Supportive care  Palliative care meeting today and plan for comfort care.  Incidental stroke - punctate infarcts in right frontal lobe, left parietal lobe, and left cerebellum, likely synchronized small vessel events vs. cardioembolic source  MRI showed punctate infarcts in right frontal, left parietal, and left cerebellum  MRA  No large vessel significant stenosis   Carotid Doppler  pending   2D Echo  No source of embolus   LDL 54  HgbA1c 8.7, not at goal  Could be related to hypoglycemia events triggering focal ischemia  Not able to rule out cardioembolic infarcts  Heparin 5000 units sq tid for VTE prophylaxis Diet NPO time specified. May need to discussed alternative feeding methods depending on pt progress, plan of care  aspirin 325 mg daily and clopidogrel 75 mg daily prior to admission, now on aspirin 300 mg suppository daily due to NPO status  Disposition:  Plan for comfort care only   Hypertension  Stable  Hyperlipidemia  Home meds:  lipitor 20  LDL 54, goal < 70  Plan for comfort care only  Hypoglycemia  Hx Diabetes  HgbA1c 8.7  Uncontrolled  On D5NS  Glucose control per primary team.  Other Stroke Risk Factors  Advanced age  Coronary artery disease s/p CABG  Other Active Problems  Left femoral neck fracture s/p  hemiarthroplasty 01/28/2015 - explains LLE weakness comparing with [facility] day # 1  The family met with palliative care today and plan for comfort care only.  Neurology will sign off. Please call with questions. Thanks for the consult.  Rosalin Hawking, MD PhD Stroke Neurology 02/05/2015 1:11 PM  To contact Stroke Continuity provider, please refer to [email or web address] After hours, contact General Neurology

## 2015-02-05 NOTE — Progress Notes (Signed)
[facility] PROGRESS NOTE  Clement HusbandsShirley Ricketson WUJ:[id number] DOB: 02/14/1929 DOA: 02/02/2015 PCP: Gaye AlkenBARNES,ELIZABETH STEWART, MD  Assessment/Plan: Marchiafava-Bignami Disease with acute encephalopathy -[name] is an 81 year old with multiple comorbidities including hypertension, diabetes mellitus, solid history of coronary artery disease found minimally responsive by nursing home staff. She was found to be hypoglycemic with blood sugars in the 30s. -She was initially worked up with an MRI of brain that revealed multiple small focal areas of restricted diffusion in both cerebral hemispheres and the left cerebellum compatible with acute infarct. MRI demyelination of b/l corpus callosum and subcortical white matter -Patient was seen and evaluated by neurology overnight. Changes on MRI could be consistent with hypoglycemic brain injury. Other possibilities include thromboembolic phenomena.  Continue CVA protocol, further workup with Carotid Doppler pending  2D Echo No source of embolus   LDL 54 HgbA1c 8.7, not at goal-Continue antiplatelet therapy with aspirin. -Neurology  recommends a 30 day cardiac event monitor the patient improves, Patient remains NPO, high risk for aspiration, continue rectal aspirin If patient progresses, neurology recommends aspirin 325+ Plavix 75 mg Palliative care on board and if the patient does not improve in the next 24 hours: Then goal is Comfort care Morphine discontinued to better evaluate her cognition , did not get any since midnight    2.  Hypoglycemia with history of diabetes mellitus. -Patient with history of diabetes mellitus had been on Januvia 100 mg by mouth every daily, metformin 500 mg daily at bedtime and 1000 mg every morning, and glimeperide 4 mg by mouth daily per discharge summary of 01/30/2015.  -She presented with hypoglycemia requiring treatment with IV dextrose.   changes on MRI could reflect hypoglycemic brain injury -Blood sugar  stabilizes morning, we'll continue to monitor. -Will change Accu-Cheks to every 4 hours Hemoglobin A1c 8.7   3.  Acute encephalopathy secondary to #1 -Likely related to hypoglycemia as she was found to have blood sugars in the 30s at a skilled nursing facility. -This could've also been related to acute CVA  do not observe purposeful movements.   4.  History of left femoral neck fracture -Status post hemiarthroplasty on 01/28/2015  5.  Goals of care -I spoke with her daughter [name] regarding medical goals of care for her mother. I explained MRI findings and ongoing poor responsiveness this morning.  With her advanced age, multiple comorbidities, MRI findings and lack of improvement despite correction of blood sugars, prognosis remains poor. She stated that the priority here was focusing on her mother's comfort. She would like her mother to complete CVA workup with MRA, transthoracic echocardiogram, carotid Dopplers. If no improvement in the next 24 hours, then focus on strictly comfort care, residential hospice, meeting on 11/25 at 11 AM   Code Status: DO NOT RESUSCITATE  Family Communication: I spoke with her daughter this morning over telephone  Disposition Plan: Plan to complete CVA workup, pending palliative care consultation. Poor prognosis.   Consultants:  Neurology   HPI/  [name] 's an 81 year old female with a past medical history of coronary artery disease, hypertension, diabetes mellitus, carotid artery disease, recently admitted from 01/26/2015 through 01/30/2015 where she was treated for left femoral neck fracture, undergoing hemiarthroplasty on 01/28/2015. She was discharged to skilled nursing facility for acute rehabilitation. She was transferred to the emergency department on 02/02/2015 found unresponsive by nursing home staff. She was found to be hypoglycemic with blood sugars in the 30s. She was given IV dextrose. Overnight an MRI revealed multiple small focal  areas involving bilateral cerebral hemispheres and left cerebellum have an appearance of acute CVA. She was evaluated by neurology who felt that changes on MRI could also be consistent with consequences of hypoglycemia.   Subjective- Patient nonverbal , patient not able to participate in any evaluations including speech therapy and physical therapy evaluation  Objective: Filed Vitals:   02/05/15 0400 02/05/15 0736  BP: 158/82 151/72  Pulse: 93 89  Temp:  98.5 F (36.9 C)  Resp: 13 18    Intake/Output Summary (Last 24 hours) at 02/05/15 1007 Last data filed at 02/05/15 0400  Gross per 24 hour  Intake    900 ml  Output      0 ml  Net    900 ml   Filed Weights   02/02/15 2053  Weight: 67.813 kg (149 lb 8 oz)    Exam:   General:  Patient is minimally responsive, cannot open her eyes to command, I cannot elicit any purposeful movement   Cardiovascular: 2/6 systolic ejection murmur   Respiratory: Normal respiratory effort, lungs are clear to auscultation   Abdomen: Soft nontender nondistended   Musculoskeletal: Suspected deep tissue injury to sacral decub  Neurological: I cannot perform adequate neurologic examination given unresponsiveness. She does not have purposeful movements, cannot follow commands nor open her eyes to command.  Data Reviewed: Basic Metabolic Panel:  Recent Labs Lab 01/30/15 0300 02/02/15 1558 02/04/15 0845  NA 134* 136 139  K 4.9 5.1 3.6  CL 104 103 106  CO2 GLUCOSE 199* 147* 142*  BUN 23* 24* 14  CREATININE 1.27* 1.28* 0.99  CALCIUM 8.6* 9.3 8.6*   Liver Function Tests:  Recent Labs Lab 02/02/15 1558 02/04/15 0845  AST 30 23  ALT 10* 7*  ALKPHOS 83 67  BILITOT 0.4 0.6  PROT 6.8 5.8*  ALBUMIN 2.1* 1.9*   No results for input(s): LIPASE, AMYLASE in the last 168 hours.  Recent Labs Lab 02/03/15 0311  AMMONIA 21   CBC:  Recent Labs Lab 01/30/15 0300 02/02/15 1558 02/04/15 0845  WBC 7.2 9.2 7.7  NEUTROABS   --  8.0*  --   HGB 9.1* 9.6* 8.3*  HCT 27.9* 30.2* 25.8*  MCV 93.0 92.1 92.1  PLT 253 460* 469*   Cardiac Enzymes:  Recent Labs Lab 02/02/15 1558  TROPONINI <0.03   BNP (last 3 results)  Recent Labs  02/02/15 1558  BNP 235.2*    ProBNP (last 3 results) No results for input(s): PROBNP in the last 8760 hours.  CBG:  Recent Labs Lab 02/04/15 1653 02/04/15 2112 02/04/15 2348 02/05/15 0301 02/05/15 0759  GLUCAP 165* 179* 201* 147* 180*    Recent Results (from the past 240 hour(s))  MRSA PCR Screening     Status: None   Collection Time: 02/03/15 12:03 AM  Result Value Ref Range Status   MRSA by PCR NEGATIVE NEGATIVE Final    Comment:        The GeneXpert MRSA Assay (FDA approved for NASAL specimens only), is one component of a comprehensive MRSA colonization surveillance program. It is not intended to diagnose MRSA infection nor to guide or monitor treatment for MRSA infections.      Studies: No results found.  Scheduled Meds: . antiseptic oral rinse  7 mL Mouth Rinse q12n4p  . aspirin  300 mg Rectal Daily   Or  . aspirin  325 mg Oral Daily  . chlorhexidine  15 mL Mouth Rinse BID  . heparin  5,000 Units Subcutaneous 3 times per day  . insulin aspart  0-9 Units Subcutaneous 6 times per day  . pantoprazole (PROTONIX) IV  40 mg Intravenous Q24H   Continuous Infusions: . dextrose 5 % and 0.9% NaCl 50 mL/hr at 02/05/15 0010    Principal Problem:   Acute encephalopathy Active Problems:   CAD- CABG '00. Cath in '09 and 05/29/13- patent grafts   Hypertension   Dyslipidemia   Depression   Dementia without behavioral disturbance   Fracture of femoral neck, left (HCC)   Pressure ulcer   CVA (cerebral infarction)   Marchiafava-Bignami disease (HCC)   Goals of care, counseling/discussion   Palliative care encounter    Time spent: 40 min    [facility]  [facility] Pager [phone] If 7PM-7AM, please contact night-coverage at  [email or web address], password [facility] 02/05/2015, 10:07 AM  LOS: 2 days

## 2015-02-06 MED ORDER — MORPHINE SULFATE (CONCENTRATE) 20 MG/ML PO SOLN: 5.0000 mg | ORAL | Status: AC | PRN

## 2015-02-06 MED ORDER — GLYCOPYRROLATE 1 MG PO TABS: 1.0000 mg | ORAL_TABLET | ORAL | Status: AC | PRN

## 2015-02-06 MED ORDER — LORAZEPAM 2 MG/ML PO CONC: 1.0000 mg | ORAL | Status: AC | PRN

## 2015-02-06 MED ORDER — HALOPERIDOL 0.5 MG PO TABS: 0.5000 mg | ORAL_TABLET | ORAL | Status: AC | PRN

## 2015-02-06 NOTE — Discharge Summary (Signed)
Physician Discharge Summary  [name] MRN: [id number] DOB/AGE: 04-28-1928 81 y.o.  PCP: Gerrit Heck, MD   Admit date: 02/02/2015 Discharge date: 02/06/2015  Discharge Diagnoses:     Principal Problem:   Acute encephalopathy Active Problems:   CAD- CABG '00. Cath in '09 and 05/29/13- patent grafts   Hypertension   Dyslipidemia   Depression   Dementia without behavioral disturbance   Fracture of femoral neck, left (HCC)   Pressure ulcer   CVA (cerebral infarction)   Marchiafava-Bignami disease (HCC)   Goals of care, counseling/discussion   Palliative care encounter    Follow-up recommendations  Continue comfort care measures      Medication List    STOP taking these medications        amLODipine 10 MG tablet  Commonly known as:  NORVASC     aspirin EC 325 MG tablet     atorvastatin 20 MG tablet  Commonly known as:  LIPITOR     buPROPion 100 MG 12 hr tablet  Commonly known as:  WELLBUTRIN SR     buPROPion 100 MG tablet  Commonly known as:  WELLBUTRIN     clopidogrel 75 MG tablet  Commonly known as:  PLAVIX     DULoxetine 60 MG capsule  Commonly known as:  CYMBALTA     feeding supplement (ENSURE ENLIVE) Liqd     furosemide 20 MG tablet  Commonly known as:  LASIX     gabapentin 100 MG capsule  Commonly known as:  NEURONTIN     glimepiride 2 MG tablet  Commonly known as:  AMARYL     LORazepam 0.5 MG tablet  Commonly known as:  ATIVAN  Replaced by:  LORazepam 2 MG/ML concentrated solution     memantine 10 MG tablet  Commonly known as:  NAMENDA     metFORMIN 500 MG 24 hr tablet  Commonly known as:  GLUCOPHAGE-XR     oxyCODONE 5 MG immediate release tablet  Commonly known as:  Oxy IR/ROXICODONE     pantoprazole 40 MG tablet  Commonly known as:  PROTONIX     polyethylene glycol packet  Commonly known as:  MIRALAX / GLYCOLAX     senna-docusate 8.6-50 MG tablet  Commonly known as:  Senokot-S     sitaGLIPtin 100 MG tablet   Commonly known as:  JANUVIA      TAKE these medications        acetaminophen 500 MG tablet  Commonly known as:  TYLENOL  Take 2 tablets (1,000 mg total) by mouth every 8 (eight) hours.     albuterol (2.5 MG/3ML) 0.083% nebulizer solution  Commonly known as:  PROVENTIL  Take 3 mLs (2.5 mg total) by nebulization every 2 (two) hours as needed for shortness of breath.     glycopyrrolate 1 MG tablet  Commonly known as:  ROBINUL  Take 1 tablet (1 mg total) by mouth every 4 (four) hours as needed (excessive secretions).     haloperidol 0.5 MG tablet  Commonly known as:  HALDOL  Take 1 tablet (0.5 mg total) by mouth every 4 (four) hours as needed for agitation (or delirium).     LORazepam 2 MG/ML concentrated solution  Commonly known as:  ATIVAN  Place 0.5 mLs (1 mg total) under the tongue every 4 (four) hours as needed for anxiety.     morphine 20 MG/ML concentrated solution  Commonly known as:  ROXANOL  Take 0.25 mLs (5 mg total) by mouth every 4 (four) hours  as needed for severe pain.     nitroGLYCERIN 0.4 MG SL tablet  Commonly known as:  NITROSTAT  Place 1 tablet (0.4 mg total) under the tongue every 5 (five) minutes as needed. For chest pain         Discharge Condition: Prognosis poor   Discharge Instructions       Discharge Instructions    Diet - low sodium heart healthy    Complete by:  As directed      Diet - low sodium heart healthy    Complete by:  As directed      Increase activity slowly    Complete by:  As directed      Increase activity slowly    Complete by:  As directed            Allergies  Allergen Reactions  . Codeine Hives      Disposition: 03-Skilled Nursing Facility   Consults:  Neurology Palliative care    Significant Diagnostic Studies:  Dg Chest 2 View  02/02/2015  CLINICAL DATA:  Altered mental status. EXAM: CHEST  2 VIEW COMPARISON:  01/26/2015 FINDINGS: Prior CABG. Heart and mediastinal contours are within normal  limits. No focal opacities or effusions. No acute bony abnormality. IMPRESSION: No active cardiopulmonary disease. Electronically Signed   By: Rolm Baptise M.D.   On: 02/02/2015 16:30   Dg Knee 1-2 Views Left  01/26/2015  CLINICAL DATA:  Acute onset of left hip pain after fall. Initial encounter. EXAM: LEFT KNEE - 1-2 VIEW COMPARISON:  None. FINDINGS: No acute fracture is seen at the knee. The distal femur appears intact. A small knee joint effusion is noted. There is suggestion of mild lateral compartment narrowing. No definite soft tissue abnormalities are otherwise characterized. IMPRESSION: 1. No evidence of fracture or dislocation. 2. Small knee joint effusion noted. 3. Suggestion of mild lateral compartment narrowing. Electronically Signed   By: Garald Balding M.D.   On: 01/26/2015 18:52   Ct Head Wo Contrast  02/02/2015  CLINICAL DATA:  Patient with recent hip replacement. Decreased level of consciousness. EXAM: CT HEAD WITHOUT CONTRAST TECHNIQUE: Contiguous axial images were obtained from the base of the skull through the vertex without intravenous contrast. COMPARISON:  Brain CT 11/17/2011; MR brain 09/18/2014. FINDINGS: Ventricles and sulci are appropriate for patient's age. Periventricular and subcortical white matter hypodensity compatible with chronic small vessel ischemic changes. No evidence for acute cortically based infarct, intracranial hemorrhage, mass lesion or mass effect. Orbits are unremarkable. Paranasal sinuses are well aerated. Calvarium is intact. IMPRESSION: No acute intracranial process. Chronic small vessel ischemic changes. Electronically Signed   By: Lovey Newcomer M.D.   On: 02/02/2015 16:44   Ct Pelvis Wo Contrast  01/26/2015  CLINICAL DATA:  Left hip and pelvic pain, after collapsing when standing up out of bed. Question for pubic symphysis fracture on radiograph. Further evaluation requested. Initial encounter. EXAM: CT PELVIS WITHOUT CONTRAST TECHNIQUE: Multidetector CT  imaging of the pelvis was performed following the standard protocol without intravenous contrast. COMPARISON:  Left hip radiographs performed earlier today at 2:59 p.m. FINDINGS: There is a minimally displaced subcapital fracture through the left femoral neck, with slight comminution. No additional fracture is seen. The pubic symphysis demonstrates mild degenerative change but is otherwise unremarkable in appearance. The right hip joint appears intact. Degenerative change is noted at the lower lumbar spine, with vacuum phenomenon at L4-L5 and L5-S1. The sacroiliac joints are grossly unremarkable, aside from mild sclerotic change at the  left sacroiliac joint. Scattered vascular calcifications are seen. Diffuse diverticulosis is noted along the distal descending and sigmoid colon. A clip is noted at the right inguinal region. No definite significant soft tissue injury is characterized. No focal soft tissue hematoma is seen. IMPRESSION: 1. Minimally displaced subcapital fracture through the left femoral neck, with slight comminution. No additional fracture seen. 2. Degenerative change at the lower lumbar spine. 3. Scattered vascular calcifications seen. 4. Diffuse diverticulosis along the distal descending and sigmoid colon. Electronically Signed   By: Garald Balding M.D.   On: 01/26/2015 18:19   Mr Jodene Nam Head Wo Contrast  02/03/2015  CLINICAL DATA:  Unresponsive. Not following commands. Multiple acute infarcts. EXAM: MRA HEAD WITHOUT CONTRAST TECHNIQUE: Angiographic images of the Circle of Willis were obtained using MRA technique without intravenous contrast. COMPARISON:  MRI brain 02/02/2015 and 09/18/2014. FINDINGS: The study is moderately degraded by patient motion. Atherosclerotic changes are present within the cavernous internal carotid arteries bilaterally without a significant stenosis. The A1 and M1 segments are normal. The anterior communicating artery is patent. The MCA bifurcations are intact. There is  marked attenuation of distal MCA branch vessels bilaterally. This is likely exaggerated by patient motion. The right vertebral artery is the dominant vessel. There is mild narrowing of the distal left vertebral artery. The basilar artery is intact. Both posterior cerebral arteries originate from the basilar tip. There is significant signal loss in the proximal PCA vessels bilaterally suggesting moderate stenoses. IMPRESSION: 1. Diffuse medium and small vessel disease. The appearance is exaggerated by significant patient motion. 2. Atherosclerotic changes within the cavernous internal carotid arteries. 3. No other significant proximal stenosis, aneurysm, or branch vessel occlusion. Electronically Signed   By: San Morelle M.D.   On: 02/03/2015 09:26   Mr Brain Wo Contrast  02/02/2015  CLINICAL DATA:  Acute encephalopathy.  Hypoglycemia EXAM: MRI HEAD WITHOUT CONTRAST TECHNIQUE: Multiplanar, multiecho pulse sequences of the brain and surrounding structures were obtained without intravenous contrast. COMPARISON:  MRI 09/18/2014 FINDINGS: Multiple small areas of restricted diffusion compatible with acute infarct. These all measure under 1 cm in are present in the right frontal lobe, left parietal lobe, and left cerebellum. The cerebral white matter and internal capsule bilaterally is diffusely hyperintense on diffusion-weighted imaging. This shows low signal on ADC map. This not felt to represent acute infarct but could be related to is leukoencephalopathy secondary to hypoglycemia. Diffusion-weighted imaging was normal on the prior study. On today's study, the diffusion-weighted abnormality in the white matter is much more extensive than that seen on T2 or FLAIR Mild atrophy. Mild chronic microvascular ischemic change in the white matter and pons. Negative for hemorrhage or mass lesion. No shift of the midline structures. IMPRESSION: Multiple small focal areas of restricted diffusion in both cerebral  hemispheres and the left cerebellum compatible with acute infarct. Atrophy and mild chronic microvascular ischemia. Patchy diffuse diffusion hyperintensity throughout the parietal white matter bilaterally is likely related to the patient's hypoglycemia. This may be reversible. Follow-up MRI suggested. Electronically Signed   By: Franchot Gallo M.D.   On: 02/02/2015 20:40   Pelvis Portable  01/28/2015  CLINICAL DATA:  S/p left total hip EXAM: PORTABLE PELVIS 1-2 VIEWS COMPARISON:  CT 01/26/2015 FINDINGS: Changes of left hip arthroplasty. No fracture. No dislocation. Regional subcutaneous gas. IMPRESSION: 1. Interval left hip arthroplasty without   apparent complication. Electronically Signed   By: Lucrezia Europe M.D.   On: 01/28/2015 20:29   Dg Chest [facility] [address]  01/26/2015  CLINICAL DATA:  Preop.  Left femoral neck fracture. EXAM: PORTABLE CHEST 1 VIEW COMPARISON:  07/03/2013 FINDINGS: Sequelae of prior CABG are again identified. The patient has taken a shallower inspiration than on the prior study and there is bronchovascular crowding with mild left basilar atelectasis. No pleural effusion or pneumothorax is identified. Surgical clips are noted in the region of the GE junction. IMPRESSION: Shallow inspiration with left basilar atelectasis. Electronically Signed   By: Logan Bores M.D.   On: 01/26/2015 16:34   Dg Hip Operative Unilat With Pelvis Left  01/28/2015  CLINICAL DATA:  Left anterior hip replacement EXAM: OPERATIVE LEFT HIP (WITH PELVIS IF PERFORMED) 3 VIEWS TECHNIQUE: Fluoroscopic spot image(s) were submitted for interpretation post-operatively. FLUOROSCOPY TIME:  11 seconds COMPARISON:  Left hip radiographs dated 01/26/2015 FINDINGS: Intraoperative fluoroscopic spot images during left hip arthroplasty. IMPRESSION: Intraoperative fluoroscopic radiographs, as above. Electronically Signed   By: Julian Hy M.D.   On: 01/28/2015 19:26   Dg Hip Unilat With Pelvis 2-3 Views Left  01/26/2015   CLINICAL DATA:  Fall at home one week ago with left hip pain. EXAM: DG HIP (WITH OR WITHOUT PELVIS) 2-3V LEFT COMPARISON:  None. FINDINGS: Examination demonstrates a minimally displaced subcapital to transcervical fracture of the left femoral neck could not exclude a nondisplaced fracture of the symphysis bilaterally. There are mild symmetric degenerative changes of the hips. There are degenerative changes of the spine. IMPRESSION: Minimally displaced left femoral neck fracture. Cannot exclude subtle fracture involving the symphysis bilaterally. Electronically Signed   By: Marin Olp M.D.   On: 01/26/2015 15:27        Filed Weights   02/02/15 2053  Weight: 67.813 kg (149 lb 8 oz)     Microbiology: Recent Results (from the past 240 hour(s))  MRSA PCR Screening     Status: None   Collection Time: 02/03/15 12:03 AM  Result Value Ref Range Status   MRSA by PCR NEGATIVE NEGATIVE Final    Comment:        The GeneXpert MRSA Assay (FDA approved for NASAL specimens only), is one component of a comprehensive MRSA colonization surveillance program. It is not intended to diagnose MRSA infection nor to guide or monitor treatment for MRSA infections.        Blood Culture    Component Value Date/Time   SDES URINE, CATHETERIZED 12/03/2011 0110   SPECREQUEST NONE 12/03/2011 0110   CULT INSIGNIFICANT GROWTH 12/03/2011 0110   REPTSTATUS 12/04/2011 FINAL 12/03/2011 0110      Labs: Results for orders placed or performed during the hospital encounter of 02/02/15 (from the past 48 hour(s))  Comprehensive metabolic panel     Status: Abnormal   Collection Time: 02/04/15  8:45 AM  Result Value Ref Range   Sodium 139 135 - 145 mmol/L   Potassium 3.6 3.5 - 5.1 mmol/L    Comment: SLIGHT HEMOLYSIS   Chloride 106 101 - 111 mmol/L   CO2 24 22 - 32 mmol/L   Glucose, Bld 142 (H) 65 - 99 mg/dL   BUN 14 6 - 20 mg/dL   Creatinine, Ser 0.99 0.44 - 1.00 mg/dL   Calcium 8.6 (L) 8.9 - 10.3 mg/dL    Total Protein 5.8 (L) 6.5 - 8.1 g/dL   Albumin 1.9 (L) 3.5 - 5.0 g/dL   AST 23 15 - 41 U/L   ALT 7 (L) 14 - 54 U/L   Alkaline Phosphatase 67 38 - 126 U/L   Total Bilirubin  0.6 0.3 - 1.2 mg/dL   GFR calc non Af Amer 51 (L) >60 mL/min   GFR calc Af Amer 59 (L) >60 mL/min    Comment: (NOTE) The eGFR has been calculated using the CKD EPI equation. This calculation has not been validated in all clinical situations. eGFR's persistently <60 mL/min signify possible Chronic Kidney Disease.    Anion gap 9 5 - 15  CBC     Status: Abnormal   Collection Time: 02/04/15  8:45 AM  Result Value Ref Range   WBC 7.7 4.0 - 10.5 K/uL   RBC 2.80 (L) 3.87 - 5.11 MIL/uL   Hemoglobin 8.3 (L) 12.0 - 15.0 g/dL   HCT 25.8 (L) 36.0 - 46.0 %   MCV 92.1 78.0 - 100.0 fL   MCH 29.6 26.0 - 34.0 pg   MCHC 32.2 30.0 - 36.0 g/dL   RDW 13.8 11.5 - 15.5 %   Platelets 469 (H) 150 - 400 K/uL  Glucose, capillary     Status: Abnormal   Collection Time: 02/04/15 11:33 AM  Result Value Ref Range   Glucose-Capillary 143 (H) 65 - 99 mg/dL  Glucose, capillary     Status: Abnormal   Collection Time: 02/04/15  4:53 PM  Result Value Ref Range   Glucose-Capillary 165 (H) 65 - 99 mg/dL   Comment 1 Notify RN    Comment 2 Document in Chart   Glucose, capillary     Status: Abnormal   Collection Time: 02/04/15  9:12 PM  Result Value Ref Range   Glucose-Capillary 179 (H) 65 - 99 mg/dL  Glucose, capillary     Status: Abnormal   Collection Time: 02/04/15 11:48 PM  Result Value Ref Range   Glucose-Capillary 201 (H) 65 - 99 mg/dL  Glucose, capillary     Status: Abnormal   Collection Time: 02/05/15  3:01 AM  Result Value Ref Range   Glucose-Capillary 147 (H) 65 - 99 mg/dL  Glucose, capillary     Status: Abnormal   Collection Time: 02/05/15  7:59 AM  Result Value Ref Range   Glucose-Capillary 180 (H) 65 - 99 mg/dL  Glucose, capillary     Status: Abnormal   Collection Time: 02/05/15 11:22 AM  Result Value Ref Range    Glucose-Capillary 171 (H) 65 - 99 mg/dL     Lipid Panel     Component Value Date/Time   CHOL 102 02/03/2015 0445   TRIG 105 02/03/2015 0445   TRIG 139 03/12/2006 0832   HDL 27* 02/03/2015 0445   CHOLHDL 3.8 02/03/2015 0445   CHOLHDL 3.3 CALC 03/12/2006 0832   VLDL 21 02/03/2015 0445   LDLCALC 54 02/03/2015 0445     Lab Results  Component Value Date   HGBA1C 8.0* 02/03/2015   HGBA1C 8.4* 01/26/2015   HGBA1C 8.9* 05/29/2013     Lab Results  Component Value Date   LDLCALC 54 02/03/2015   CREATININE 0.99 02/04/2015     HPI :81 y.o. female was last in her normal state of health last evening. This morning, she was drowsy and lethargic in the nursing home she did not improve. According to notes, glucose the morning of 11/22 was 188, but then she did not have lunch or breakfast CBG was then 54. She was given glucagon and dextrose. EMS was called and checked her glucose and found to be low(36). She was 126 and the first one we have documented here. She was started on D50 and has a glucose the 70s to  74s.  An MRI was obtained which shows diffuse hazy restricted diffusion throughout the subcortical white matter. In addition there are multifocal punctate cortical areas of restricted diffusion.   HOSPITAL COURSE:   Marchiafava-Bignami Disease with acute encephalopathy -[name] is an 81 year old with multiple comorbidities including hypertension, diabetes mellitus, solid history of coronary artery disease found minimally responsive by nursing home staff. She was found to be hypoglycemic with blood sugars in the 30s. -She was initially worked up with an MRI of brain that revealed multiple small focal areas of restricted diffusion in both cerebral hemispheres and the left cerebellum compatible with acute infarct. MRI demyelination of b/l corpus callosum and subcortical white matter -Patient was seen and evaluated by neurology overnight. Changes on MRI could be consistent with  hypoglycemic brain injury. Other possibilities include thromboembolic phenomena.  Continue CVA protocol, further workup with Carotid Doppler pending  2D Echo No source of embolus   LDL 54 HgbA1c 8.7, not at goal-Continue antiplatelet therapy with aspirin. -Neurology recommended a 30 day cardiac event monitor if the patient improves, Patient remains NPO, high risk for aspiration, okay to continue comfort feeding Palliative care met with the family on multiple occasions, family decided that they did not want to be aggressive as the patient did not have any signs of neurologic recovery during this hospitalization. Family wanted to transition   patient to full comfort care. Patient will be discharged to hospice facility with [facility]   2. Hypoglycemia with history of diabetes mellitus. -Patient with history of diabetes mellitus had been on Januvia 100 mg by mouth every daily, metformin 500 mg daily at bedtime and 1000 mg every morning, and glimeperide 4 mg by mouth daily per discharge summary of 01/30/2015.  -She presented with hypoglycemia requiring treatment with IV dextrose.  changes on MRI could reflect hypoglycemic brain injury Hemoglobin A1c 8.7   3. Acute encephalopathy secondary to #1 -Likely related to hypoglycemia as she was found to have blood sugars in the 30s at a skilled nursing facility. -This could've also been related to acute CVA We did not observe any  purposeful movements.   4. History of left femoral neck fracture -Status post hemiarthroplasty on 01/28/2015  5. Goals of care -I spoke with her daughter [name] regarding medical goals of care for her mother. I explained MRI findings and ongoing poor responsiveness this morning. With her advanced age, multiple comorbidities, MRI findings and lack of improvement despite correction of blood sugars, prognosis remains poor. She stated that the priority here was focusing on her mother's comfort. She  would like her mother to complete CVA workup with MRA, transthoracic echocardiogram, carotid Dopplers. Patient had no neurologic improvement for 72 hours Transitioned to full comfort care  Code Status: DO NOT RESUSCITATE    Discharge Exam:    Blood pressure 158/76, pulse 94, temperature 98.7 F (37.1 C), temperature source Axillary, resp. rate 12, height _0  (1.702 m), weight 67.813 kg (149 lb 8 oz), SpO2 97 %.    General: Patient is minimally responsive, cannot open her eyes to command, I cannot elicit any purposeful movement   Cardiovascular: 2/6 systolic ejection murmur   Respiratory: Normal respiratory effort, lungs are clear to auscultation   Abdomen: Soft nontender nondistended   Musculoskeletal: Suspected deep tissue injury to sacral decub  Neurological: I cannot perform adequate neurologic examination given unresponsiveness. She does not have purposeful movements, cannot follow commands nor open her eyes to command.     SignedReyne Dumas 02/06/2015,  8:02 AM        Time spent >45 mins

## 2015-02-06 NOTE — Progress Notes (Signed)
228 ml of Morphine drip wasted per protocol.  Witnessed by Silvio PateBarbara Grogan, RN.

## 2015-02-06 NOTE — Progress Notes (Signed)
Telephone call from Kathleen at [facility].  Kathleen requested that SW add to the packet the d/c summary, several progress notes and the DNR.  She requested that Pt arrive at the facility around 11:30.  Met with Pt's daughter, Anita, at bedside.  Anita aware of that [facility] requesting Pt arrive at 11:30.  She is ready for her mom to go to [facility].  No additional needs noted.  Chart ready and on chart.  SW to arrange for transportation.  Pt to be d/c'd today.  Amanda , LCSW Clinical Social Work [phone]  

## 2015-02-06 NOTE — Progress Notes (Signed)
Report called to [facility], Vernona RiegerLaura, [place].

## 2015-02-06 NOTE — Progress Notes (Signed)
Morphine drip discontinued.  Family at bedside.  EMS to room for transfer.  Per request of Vernona RiegerLaura, RN at [facility], [facility] to right [facility] after flushing with normal saline.

## 2015-02-18 ENCOUNTER — Ambulatory Visit: Payer: Medicare Other | Admitting: Neurology

## 2015-03-14 DEATH — deceased

## 2016-11-05 IMAGING — DX DG CHEST 2V
2 series · 2 of 2 positions shown · non-contrast
Comparison: 01/26/2015

CLINICAL DATA: Altered mental status.

EXAM:
CHEST  2 VIEW

[chest lat]
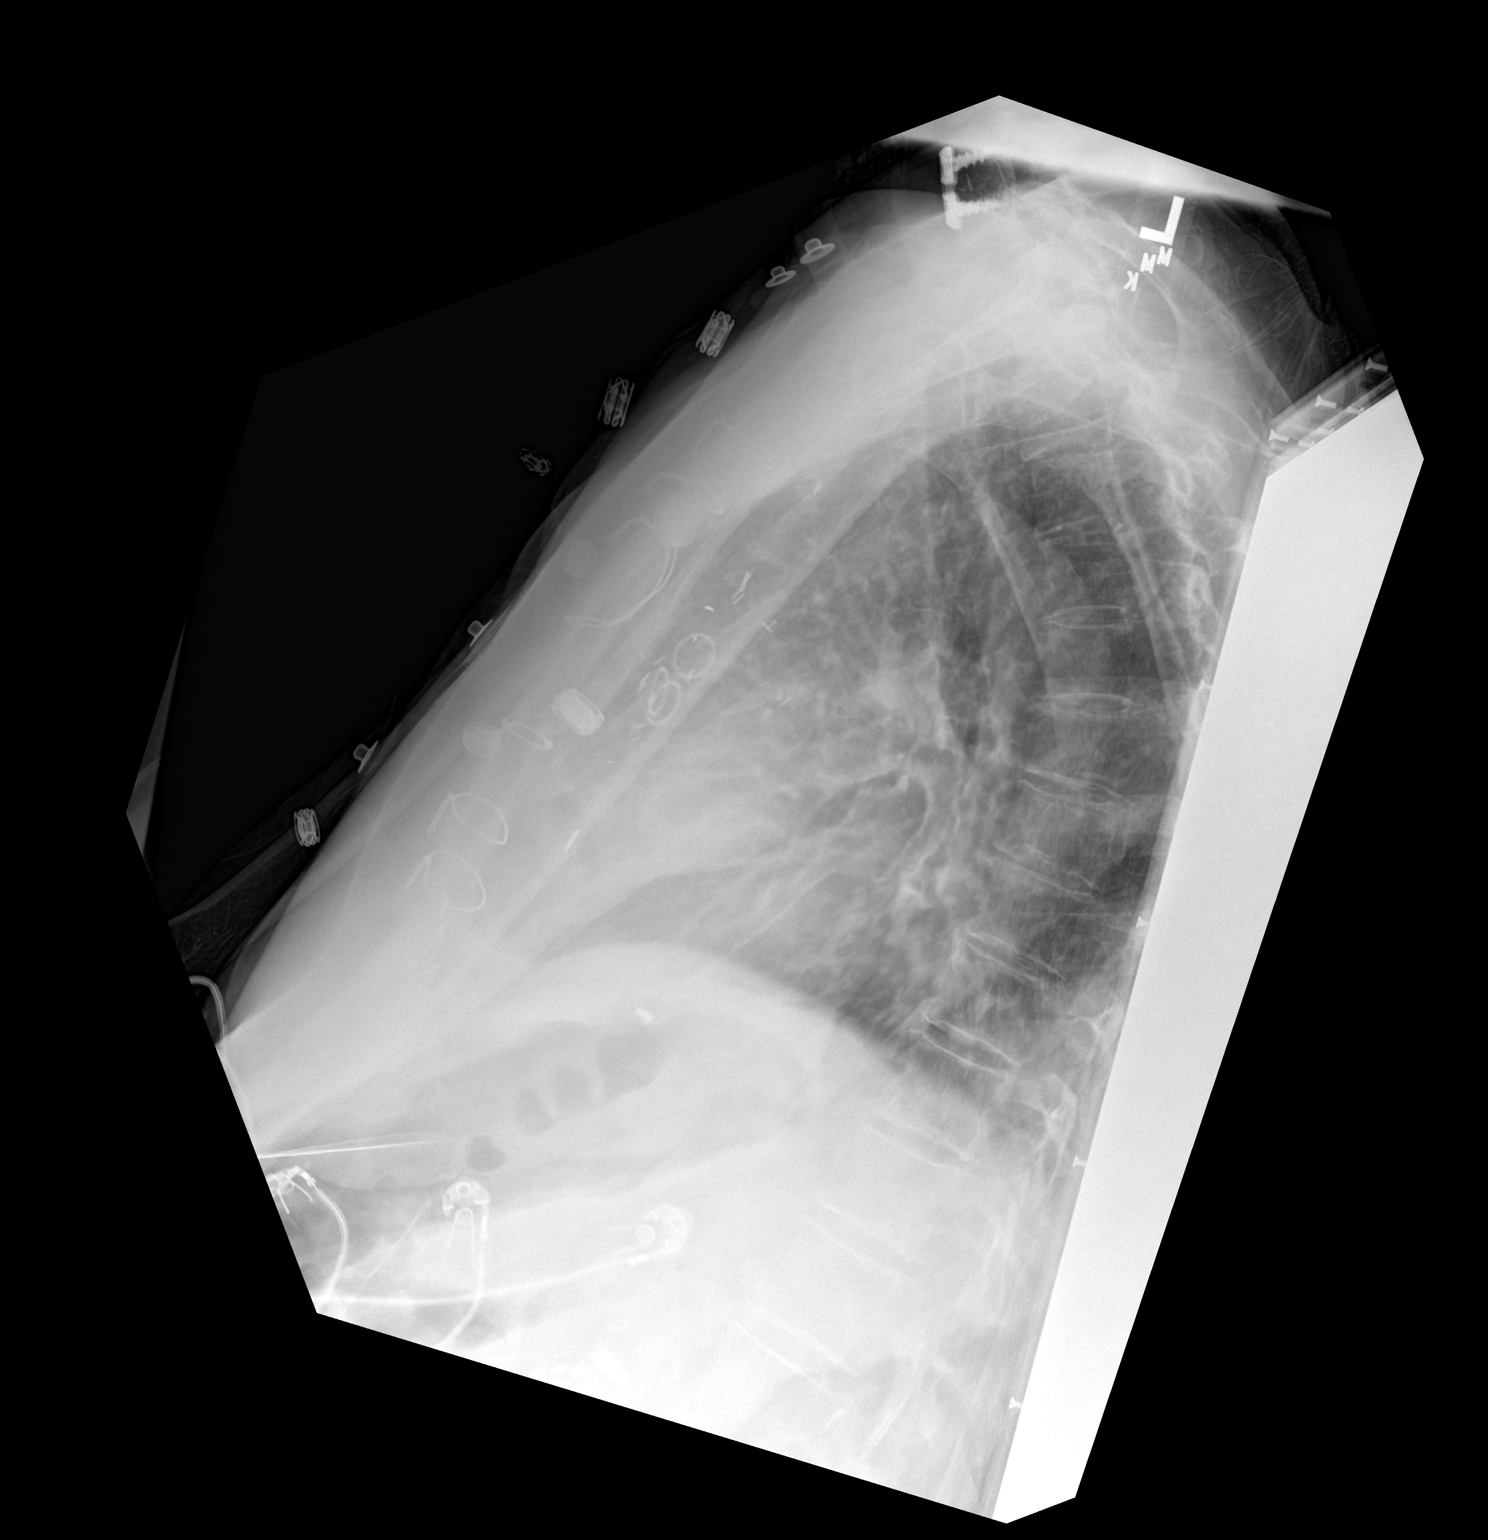

[chest ap]
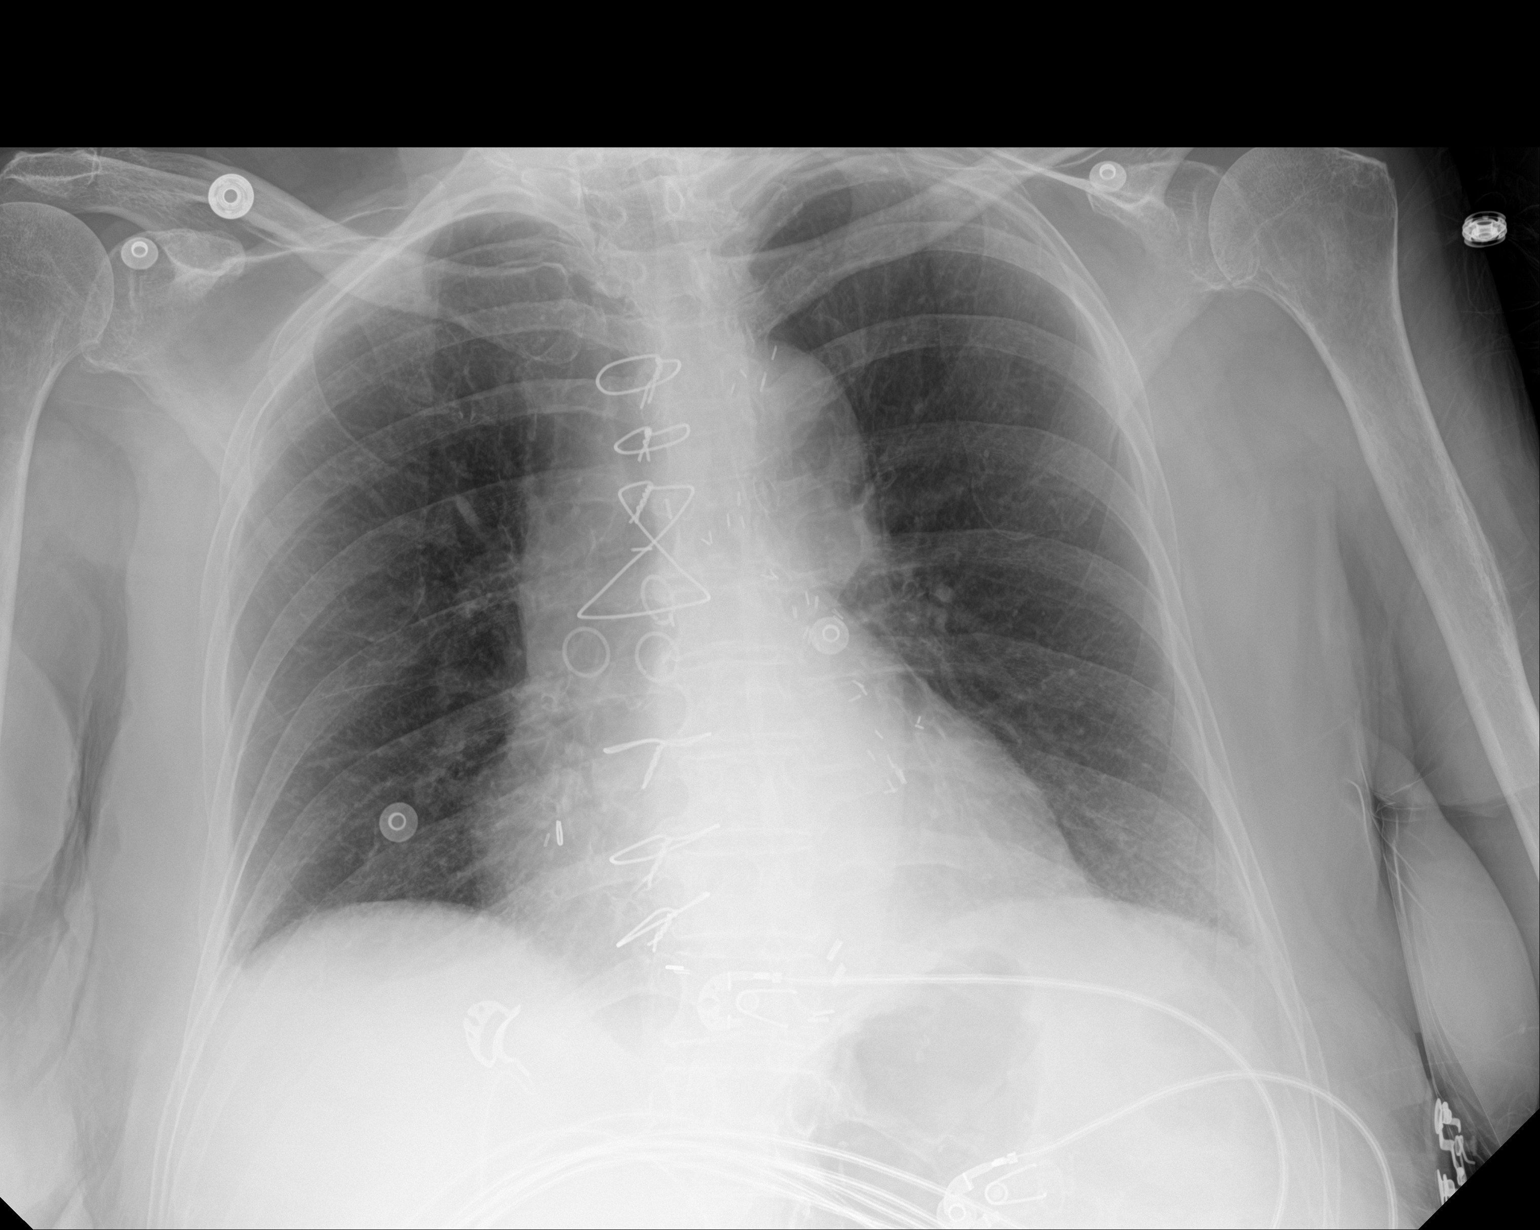

[2 of 2 positions shown; findings below may reference images not displayed]

FINDINGS: Prior CABG. Heart and mediastinal contours are within normal limits.
No focal opacities or effusions. No acute bony abnormality.
IMPRESSION: No active cardiopulmonary disease.

## 2016-11-05 IMAGING — CT CT HEAD W/O CM
2 series · 15 of 30 positions shown, 17 images · non-contrast
Comparison: Brain CT 11/17/2011; MR brain 09/18/2014.

CLINICAL DATA: Patient with recent hip replacement. Decreased level
of consciousness.

EXAM:
CT HEAD WITHOUT CONTRAST
TECHNIQUE: Contiguous axial images were obtained from the base of the skull
through the vertex without intravenous contrast.

[Series 2: head without · axial · non-contrast · 0.44mm/px · z∈[-104,+16]mm · 7 of 33 slices shown, 9 images]
[im 5/33  brain]
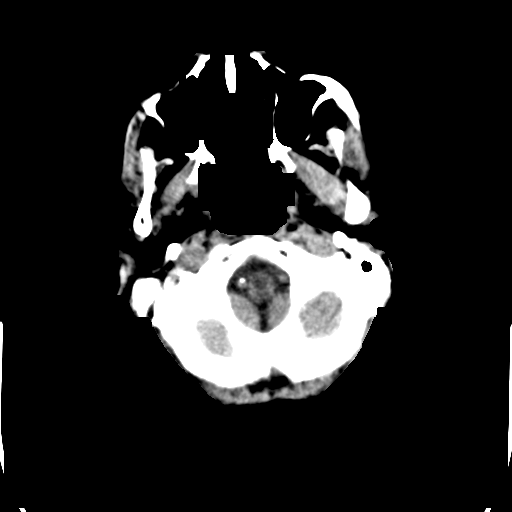
[im 5/33  bone]
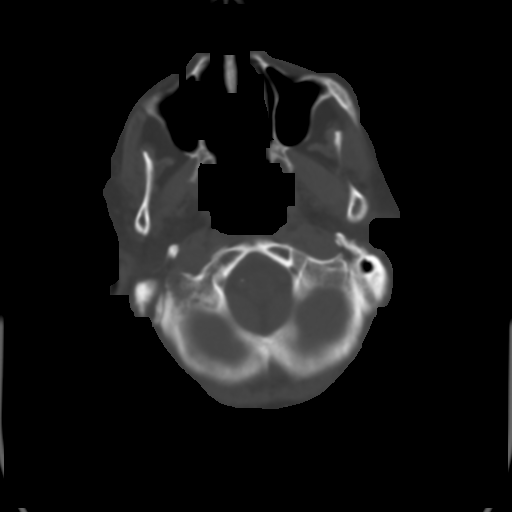
[im 9/33  brain]
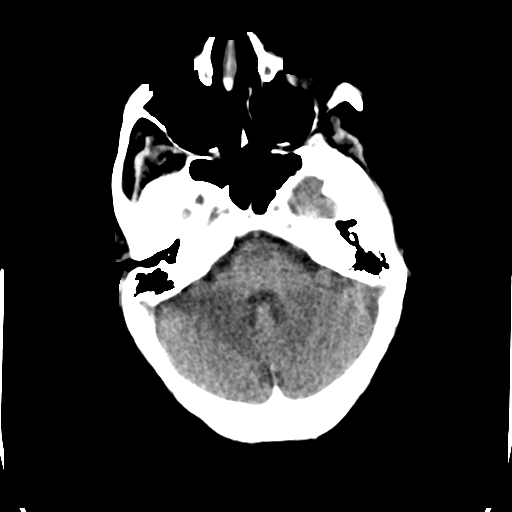
[im 13/33  brain]
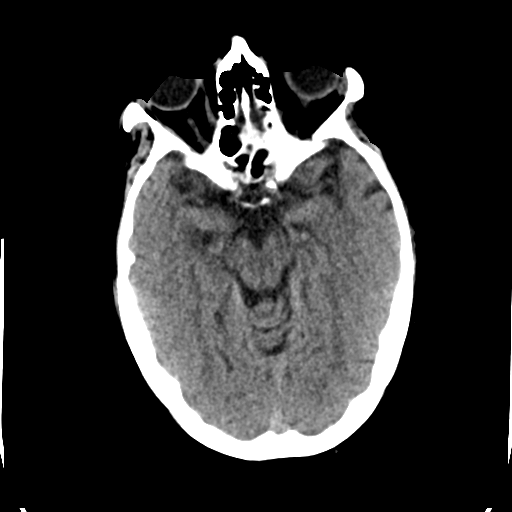
[im 17/33  brain]
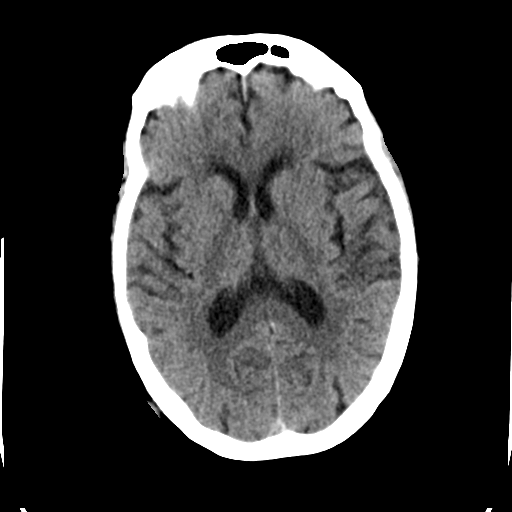
[im 21/33  brain]
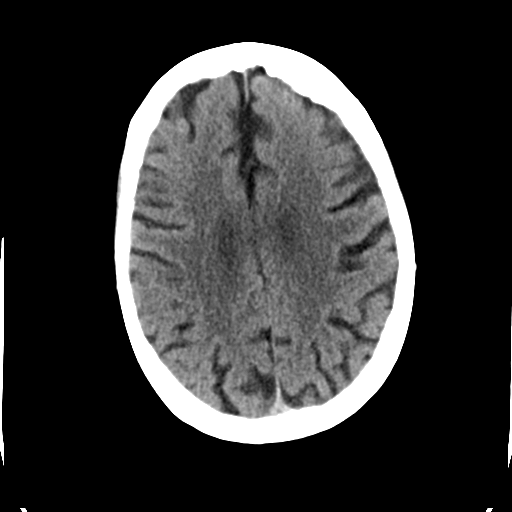
[im 21/33  bone]
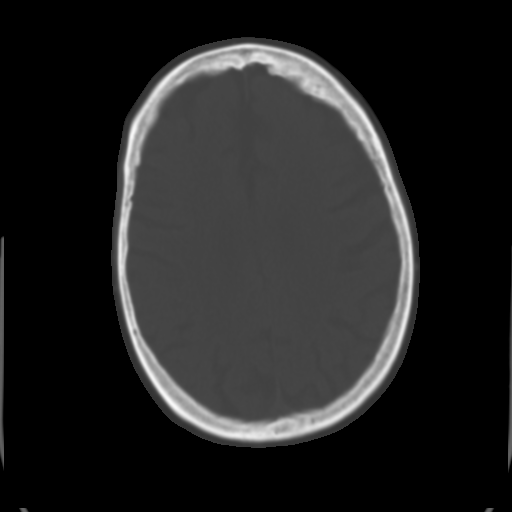
[im 25/33  brain]
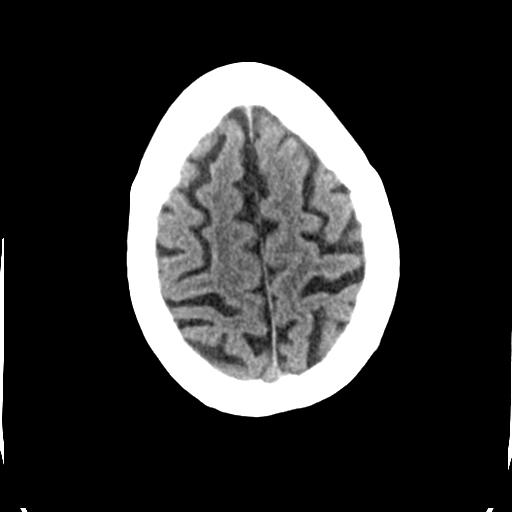
[im 29/33  brain]
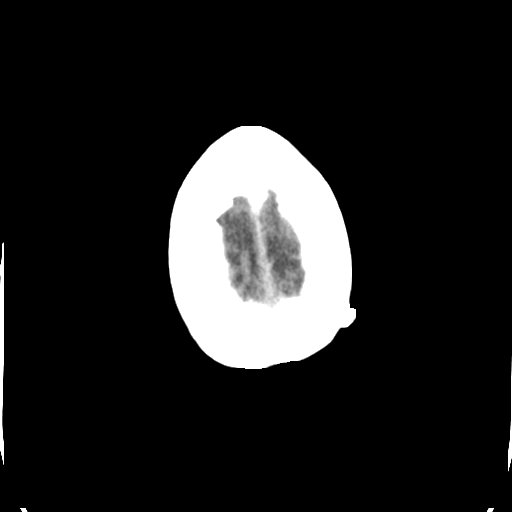

[Series 3: head bone · axial · 0.44mm/px · z∈[-108,+20]mm · 8 of 82 slices shown]
[im 9/82  bone]
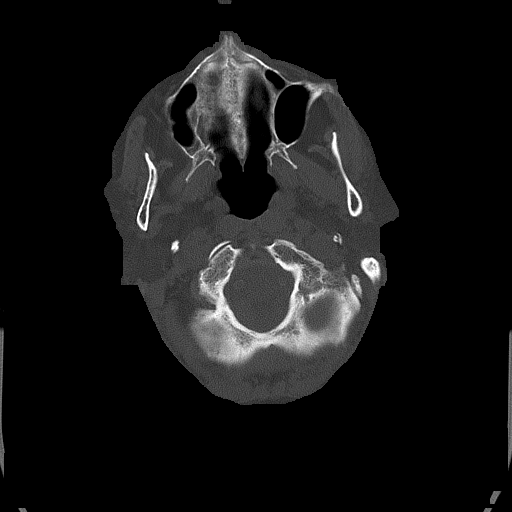
[im 17/82  bone]
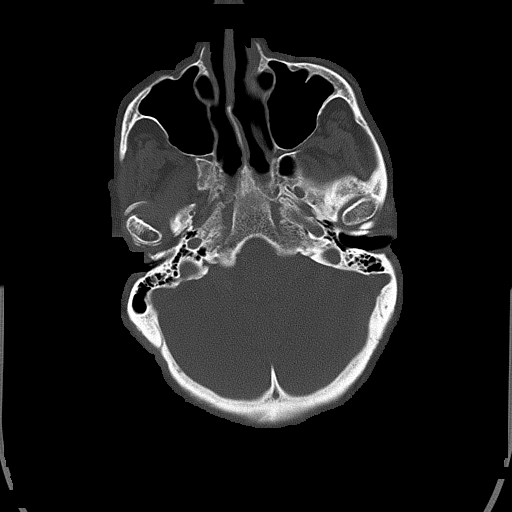
[im 25/82  bone]
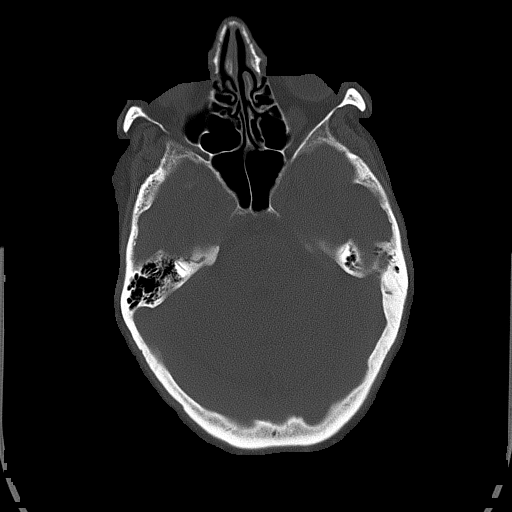
[im 37/82  bone]
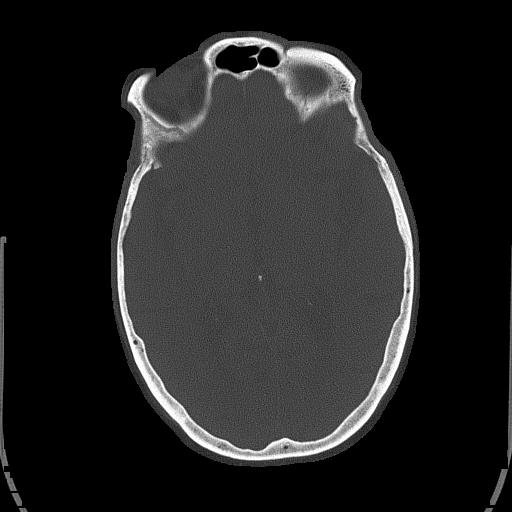
[im 45/82  bone]
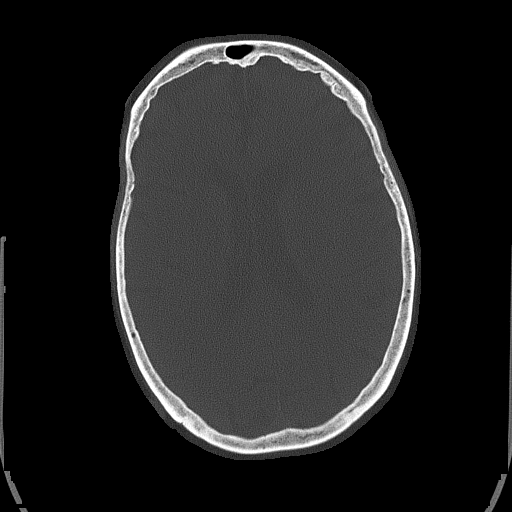
[im 57/82  bone]
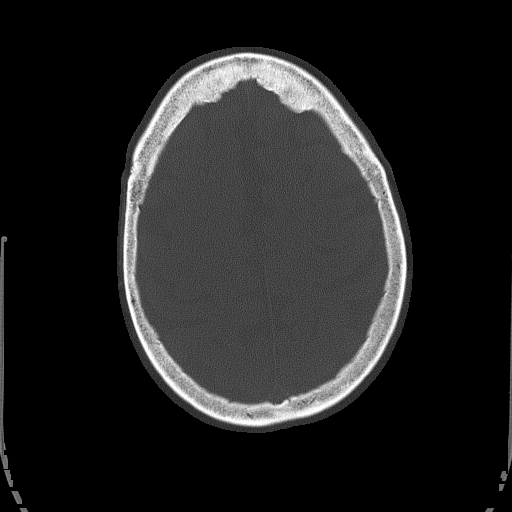
[im 65/82  bone]
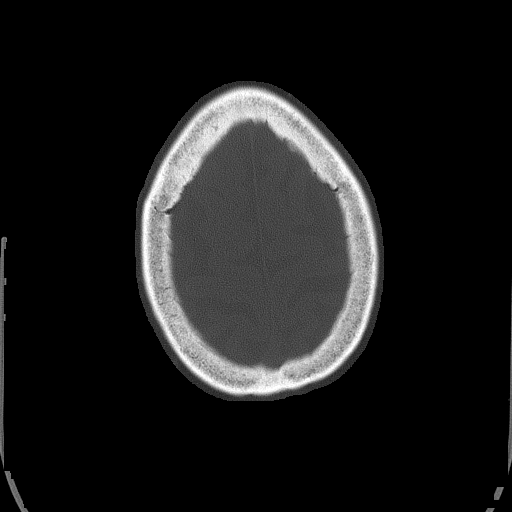
[im 73/82  bone]
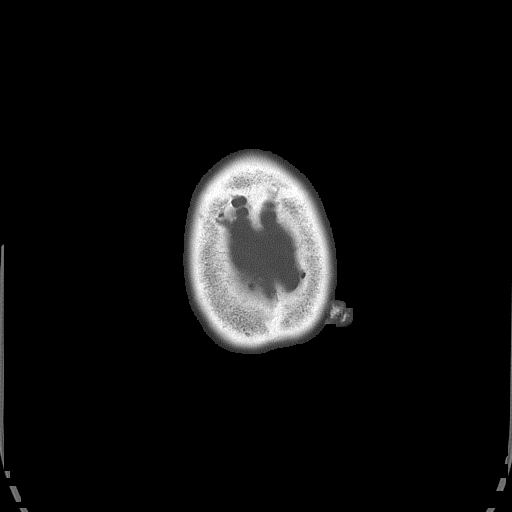

[15 of 30 positions shown; findings below may reference images not displayed]

FINDINGS: Ventricles and sulci are appropriate for patient's age.
Periventricular and subcortical white matter hypodensity compatible
with chronic small vessel ischemic changes. No evidence for acute
cortically based infarct, intracranial hemorrhage, mass lesion or
mass effect. Orbits are unremarkable. Paranasal sinuses are well
aerated. Calvarium is intact.
IMPRESSION: No acute intracranial process.

Chronic small vessel ischemic changes.
